# Patient Record
Sex: Female | Born: 1955
Health system: Southern US, Community
[De-identification: ages and names within clinical notes are randomized; demographics above are authoritative.]

## PROBLEM LIST (undated history)

## (undated) DIAGNOSIS — K219 Gastro-esophageal reflux disease without esophagitis: Secondary | ICD-10-CM

## (undated) DIAGNOSIS — E1110 Type 2 diabetes mellitus with ketoacidosis without coma: Secondary | ICD-10-CM

## (undated) DIAGNOSIS — K5792 Diverticulitis of intestine, part unspecified, without perforation or abscess without bleeding: Secondary | ICD-10-CM

## (undated) DIAGNOSIS — B019 Varicella without complication: Secondary | ICD-10-CM

## (undated) DIAGNOSIS — E785 Hyperlipidemia, unspecified: Secondary | ICD-10-CM

## (undated) DIAGNOSIS — R0789 Other chest pain: Secondary | ICD-10-CM

## (undated) DIAGNOSIS — T783XXA Angioneurotic edema, initial encounter: Secondary | ICD-10-CM

## (undated) DIAGNOSIS — H409 Unspecified glaucoma: Secondary | ICD-10-CM

## (undated) DIAGNOSIS — I519 Heart disease, unspecified: Secondary | ICD-10-CM

## (undated) DIAGNOSIS — I10 Essential (primary) hypertension: Secondary | ICD-10-CM

## (undated) DIAGNOSIS — I493 Ventricular premature depolarization: Secondary | ICD-10-CM

## (undated) DIAGNOSIS — Z6740 Type O blood, Rh positive: Secondary | ICD-10-CM

## (undated) HISTORY — DX: Other chest pain: R07.89

## (undated) HISTORY — DX: Hyperlipidemia, unspecified: E78.5

## (undated) HISTORY — DX: Ventricular premature depolarization: I49.3

## (undated) HISTORY — DX: Essential (primary) hypertension: I10

## (undated) HISTORY — DX: Type O blood, Rh positive: Z67.40

## (undated) HISTORY — DX: Heart disease, unspecified: I51.9

## (undated) HISTORY — DX: Angioneurotic edema, initial encounter: T78.3XXA

## (undated) HISTORY — DX: Type 2 diabetes mellitus with ketoacidosis without coma: E11.10

## (undated) HISTORY — PX: TOTAL ABDOMINAL HYSTERECTOMY: SHX209

## (undated) HISTORY — DX: Unspecified glaucoma: H40.9

## (undated) HISTORY — DX: Varicella without complication: B01.9

## (undated) HISTORY — DX: Diverticulitis of intestine, part unspecified, without perforation or abscess without bleeding: K57.92

## (undated) HISTORY — DX: Gastro-esophageal reflux disease without esophagitis: K21.9

---

## 1999-03-24 ENCOUNTER — Encounter: Admission: RE | Admit: 1999-03-24 | Discharge: 1999-03-24 | Payer: Self-pay | Admitting: Gynecology

## 1999-03-24 ENCOUNTER — Other Ambulatory Visit: Admission: RE | Admit: 1999-03-24 | Discharge: 1999-03-24 | Payer: Self-pay | Admitting: Gynecology

## 1999-03-24 ENCOUNTER — Encounter: Payer: Self-pay | Admitting: Gynecology

## 2000-03-28 ENCOUNTER — Other Ambulatory Visit: Admission: RE | Admit: 2000-03-28 | Discharge: 2000-03-28 | Payer: Self-pay | Admitting: Gynecology

## 2001-03-21 ENCOUNTER — Other Ambulatory Visit: Admission: RE | Admit: 2001-03-21 | Discharge: 2001-03-21 | Payer: Self-pay | Admitting: Gynecology

## 2001-03-26 ENCOUNTER — Encounter: Payer: Self-pay | Admitting: Gynecology

## 2001-03-26 ENCOUNTER — Encounter: Admission: RE | Admit: 2001-03-26 | Discharge: 2001-03-26 | Payer: Self-pay | Admitting: Dermatology

## 2002-04-11 HISTORY — PX: TOTAL ABDOMINAL HYSTERECTOMY: SHX209

## 2002-05-14 ENCOUNTER — Other Ambulatory Visit: Admission: RE | Admit: 2002-05-14 | Discharge: 2002-05-14 | Payer: Self-pay | Admitting: Gynecology

## 2003-02-21 ENCOUNTER — Encounter: Admission: RE | Admit: 2003-02-21 | Discharge: 2003-02-21 | Payer: Self-pay | Admitting: Gynecology

## 2003-02-26 ENCOUNTER — Other Ambulatory Visit: Admission: RE | Admit: 2003-02-26 | Discharge: 2003-02-26 | Payer: Self-pay | Admitting: Gynecology

## 2003-05-28 ENCOUNTER — Encounter (INDEPENDENT_AMBULATORY_CARE_PROVIDER_SITE_OTHER): Payer: Self-pay | Admitting: *Deleted

## 2003-05-28 ENCOUNTER — Observation Stay (HOSPITAL_COMMUNITY): Admission: RE | Admit: 2003-05-28 | Discharge: 2003-05-29 | Payer: Self-pay | Admitting: Gynecology

## 2004-02-02 ENCOUNTER — Encounter: Admission: RE | Admit: 2004-02-02 | Discharge: 2004-02-02 | Payer: Self-pay | Admitting: Family Medicine

## 2004-03-17 ENCOUNTER — Encounter: Admission: RE | Admit: 2004-03-17 | Discharge: 2004-03-17 | Payer: Self-pay | Admitting: Gynecology

## 2004-03-25 ENCOUNTER — Encounter: Admission: RE | Admit: 2004-03-25 | Discharge: 2004-03-25 | Payer: Self-pay | Admitting: Gynecology

## 2004-06-17 ENCOUNTER — Other Ambulatory Visit: Admission: RE | Admit: 2004-06-17 | Discharge: 2004-06-17 | Payer: Self-pay | Admitting: Gynecology

## 2005-04-18 ENCOUNTER — Encounter: Admission: RE | Admit: 2005-04-18 | Discharge: 2005-04-18 | Payer: Self-pay | Admitting: Gynecology

## 2005-06-23 ENCOUNTER — Other Ambulatory Visit: Admission: RE | Admit: 2005-06-23 | Discharge: 2005-06-23 | Payer: Self-pay | Admitting: Gynecology

## 2006-05-18 ENCOUNTER — Encounter: Admission: RE | Admit: 2006-05-18 | Discharge: 2006-05-18 | Payer: Self-pay | Admitting: Gynecology

## 2006-07-20 ENCOUNTER — Other Ambulatory Visit: Admission: RE | Admit: 2006-07-20 | Discharge: 2006-07-20 | Payer: Self-pay | Admitting: Gynecology

## 2007-05-21 ENCOUNTER — Encounter: Admission: RE | Admit: 2007-05-21 | Discharge: 2007-05-21 | Payer: Self-pay | Admitting: Gynecology

## 2007-09-18 ENCOUNTER — Encounter: Admission: RE | Admit: 2007-09-18 | Discharge: 2007-09-18 | Payer: Self-pay | Admitting: Cardiology

## 2008-04-11 HISTORY — PX: CARDIAC CATHETERIZATION: SHX172

## 2008-04-11 HISTORY — PX: OTHER SURGICAL HISTORY: SHX169

## 2008-06-06 ENCOUNTER — Encounter: Admission: RE | Admit: 2008-06-06 | Discharge: 2008-06-06 | Payer: Self-pay | Admitting: Gynecology

## 2009-06-08 ENCOUNTER — Encounter: Admission: RE | Admit: 2009-06-08 | Discharge: 2009-06-08 | Payer: Self-pay | Admitting: Gynecology

## 2010-05-01 ENCOUNTER — Encounter: Payer: Self-pay | Admitting: Gynecology

## 2010-06-28 ENCOUNTER — Other Ambulatory Visit: Payer: Self-pay | Admitting: Gynecology

## 2010-06-28 DIAGNOSIS — Z1231 Encounter for screening mammogram for malignant neoplasm of breast: Secondary | ICD-10-CM

## 2010-07-15 ENCOUNTER — Ambulatory Visit
Admission: RE | Admit: 2010-07-15 | Discharge: 2010-07-15 | Disposition: A | Discharge status: Home / Self Care | Payer: 59 | Source: Ambulatory Visit | Attending: Gynecology | Admitting: Gynecology

## 2010-07-15 DIAGNOSIS — Z1231 Encounter for screening mammogram for malignant neoplasm of breast: Secondary | ICD-10-CM

## 2010-08-02 ENCOUNTER — Other Ambulatory Visit: Payer: Self-pay | Admitting: Gynecology

## 2010-08-27 NOTE — H&P (Signed)
NAME:  Carol [name], Carol [name]                           ACCOUNT NO.:  [id number]   MEDICAL RECORD NO.:  [id number]                   PATIENT TYPE:  AMB   LOCATION:  DAY                                  FACILITY:  [facility]   PHYSICIAN:  Gretta Cool, M.D.              DATE OF BIRTH:  [birth year]   DATE OF ADMISSION:  DATE OF DISCHARGE:                                HISTORY & PHYSICAL   CHIEF COMPLAINT:  Abnormal uterine bleeding.   HISTORY OF PRESENT ILLNESS:  Carol [name] is a 55 year old nulligravida with a  history of subseptate uterus determined by diagnostic laparoscopy in 1986.  She has had no significant GYN concerns until the last year when she has  developed abnormal uterine bleeding.  On ultrasound examination, she has  multiple uterine leiomyomata, one of which involves the endometrial cavity.  She has had progressively severe abnormal uterine bleeding with prolongation  of flow.  On ultrasound examination, she has multiple fibroids with luminal  intrusion into the cavity, which would be difficult to manage by  hysteroscopy.  She is now admitted for definitive therapy by total abdominal  hysterectomy versus supracervical hysterectomy, possible bilateral salpingo-  oophorectomy.  I have discussed all options, including embolization therapy.  She wishes to proceed to definitive therapy, as planned.  The risks and  benefits ratio have been explained in complete detail.  All options have  been explained in complete detail.   PAST MEDICAL HISTORY:  Usual childhood diseases without sequelae.  Denies  significant previous medical illness.  No hospitalizations other than for a  diagnostic laparoscopy   MEDICATIONS:  None.   ALLERGIES:  1. SULFA.  2. MACRODANTIN.   FAMILY HISTORY:  Father died of cancer of the lung at an early age.  Mother  died of cardiovascular disease and emphysema.  A strong family history of  cardiovascular disease in two brothers and a sister with hypertension.   No  other known familial tendency.   REVIEW OF SYSTEMS:  HEENT:  Denies asthma, cough, bronchitis, shortness of  breath.  GI/GU:  Denies frequency, urgency, dysuria, change in bowel habits,  or food intolerance.   PHYSICAL EXAMINATION:  GENERAL:  A well-developed and well-nourished thin  white female.  HEENT:  Pupils are equal, round and reactive to light and accommodation.  Fundi not examined.  Oropharynx clear.  NECK:  Supple without mass or thyroid enlargement.  LUNGS:  Clear to P&A.  HEART:  Regular rhythm without murmur or cardiac enlargement.  BREASTS:  Without mass, nodes, or nipple discharge.  ABDOMEN:  Soft, scaphoid, without mass or organomegaly.  Her uterus is  nonpalpable abdominally.  PELVIC:  External genitalia:  Normal female.  Vagina clean, rugous.  Cervix  is nulliparous.  Clean.  The uterus is firm and enlarged with multiple  leiomyomata.  Adnexa clear.  Rectovaginal exam confirms.  EXTREMITIES:  Negative.  NEUROLOGIC:  Physiologic.  IMPRESSION:  1. Uterine leiomyomata with multiple fibroids involving the cavity and     abnormal uterine bleeding.  2. Dyslipidemia.  3. Hypertriglyceridemia.  4. Strong family history of early onset of cardiovascular disease and     metabolic syndrome by NMR LipoProfile.                                               Gretta Cool, M.D.    CWL/MEDQ  D:  05/27/2003  T:  05/27/2003  Job:  [id number]   cc:   [facility]

## 2010-08-27 NOTE — Op Note (Signed)
NAME:  Carol [name], Carol [name]                           ACCOUNT NO.:  [id number]   MEDICAL RECORD NO.:  [id number]                   PATIENT TYPE:  AMB   LOCATION:  DAY                                  FACILITY:  [facility]   PHYSICIAN:  Gretta Cool, M.D.              DATE OF BIRTH:  [birth year]   DATE OF PROCEDURE:  05/28/2003  DATE OF DISCHARGE:                                 OPERATIVE REPORT   PREOPERATIVE DIAGNOSES:  1. Uterine leiomyomata with abnormal uterine bleeding.  2. Subseptate uterus.  3. Perimenopausal.   POSTOPERATIVE DIAGNOSES:  1. Uterine leiomyomata with abnormal uterine bleeding.  2. Subseptate uterus.  3. Perimenopausal.  4. Left ovarian adhesion suspicious of endometriosis subseptate uterus.   PROCEDURE:  Supracervical hysterectomy, bilateral salpingo-oophorectomy.   SURGEON:  Gretta Cool, M.D.   ASSISTANT:  Raynald Kemp, M.D.   ANESTHESIA:  General orotracheal.   DESCRIPTION OF PROCEDURE:  Under excellent general anesthesia with the  patient prepped and draped in lithotomy position, Foley catheter draining  her bladder, a Pfannenstiel incision was made and extended through the  fascia.  The rectus muscles were separated in the midline and the peritoneum  opened.  The abdominal cavity was examined and the abnormalities identified.  No abnormalities in the upper abdomen identified.  The examination of the  uterus revealed multiple leiomyomata, a cystic fixed adherent left ovary  with dense adhesions on the inferior aspect to the lateral pelvic wall.  There was characteristic deformity of subseptate uterus present.  Multiple  fibroids bulging into the cavity.  At this point because of a withered right  ovary and a very densely adherent left ovary, decision was made to proceed  with bilateral salpingo-oophorectomy as well.  At this point, the uterus was  elevated with Kelly clamps applied across each adnexal structures, uterus  was elevated and the round  ligaments transected by cautery.  The bladder  flap was then opened and the bladder pushed off the lower segment.  Infundibulopelvic vessels were then isolated from the ureter, clamped, cut,  sutured, and tied with 0 Vicryl.  The pedicle was then doubly ligated with 0  Vicryl.  At this point, the uterine vessels were skeletonized, clamped, cut,  sutured, and tied with 0 Vicryl.  Next, the cervix was progressively excised  and the uterus elevated so as to excise the greatest majority of the  endocervical canal.  At the end of the excision, a conical excision of the  endocervical canal had been made.  The remainder of the canal was treated by  cautery.  At this point, the cervix was closed with deep suture of 0 Vicryl  and superficial closure of 0 Vicryl as well.  At the end of the procedure,  the pelvic floor was reperitonealized with running suture of 2-0 Monocryl.  The vessels irrigated to remove debris and the packs removed.  The  retractors  were removed.  The abdominal peritoneum was then closed with  running suture of 0 Monocryl.  That same suture was used to close the rectus  muscles plicating the rectus muscles in the midline.  The fascia was  approximated with running suture of 0 Vicryl from each angle of the midline.  Subcutaneous tissue was approximated with interrupted  sutures of 3-0 Vicryl and the skin was closed with skin staples and Steri-  Strips.  At the end of the procedure, the sponge and lap counts are correct.  There were no complications.  The patient returned to the recovery room in  excellent condition without complication.                                               Gretta Cool, M.D.    CWL/MEDQ  D:  05/28/2003  T:  05/28/2003  Job:  [id number]   cc:   Deboraha Sprang at [facility]

## 2011-07-19 ENCOUNTER — Other Ambulatory Visit: Payer: Self-pay | Admitting: Gynecology

## 2011-07-19 DIAGNOSIS — Z1231 Encounter for screening mammogram for malignant neoplasm of breast: Secondary | ICD-10-CM

## 2011-08-03 ENCOUNTER — Ambulatory Visit
Admission: RE | Admit: 2011-08-03 | Discharge: 2011-08-03 | Disposition: A | Discharge status: Home / Self Care | Payer: 59 | Source: Ambulatory Visit | Attending: Gynecology | Admitting: Gynecology

## 2011-08-03 ENCOUNTER — Other Ambulatory Visit: Payer: Self-pay | Admitting: Gynecology

## 2011-08-03 DIAGNOSIS — Z1231 Encounter for screening mammogram for malignant neoplasm of breast: Secondary | ICD-10-CM

## 2012-09-17 ENCOUNTER — Other Ambulatory Visit: Payer: Self-pay

## 2012-09-17 DIAGNOSIS — Z1231 Encounter for screening mammogram for malignant neoplasm of breast: Secondary | ICD-10-CM

## 2012-10-01 ENCOUNTER — Ambulatory Visit: Payer: 59

## 2012-10-05 ENCOUNTER — Ambulatory Visit
Admission: RE | Admit: 2012-10-05 | Discharge: 2012-10-05 | Disposition: A | Discharge status: Home / Self Care | Payer: 59 | Source: Ambulatory Visit

## 2012-10-05 DIAGNOSIS — Z1231 Encounter for screening mammogram for malignant neoplasm of breast: Secondary | ICD-10-CM

## 2013-01-05 ENCOUNTER — Other Ambulatory Visit: Payer: Self-pay | Admitting: Cardiology

## 2013-01-05 DIAGNOSIS — Z79899 Other long term (current) drug therapy: Secondary | ICD-10-CM

## 2013-01-05 DIAGNOSIS — E7800 Pure hypercholesterolemia, unspecified: Secondary | ICD-10-CM

## 2013-01-25 ENCOUNTER — Other Ambulatory Visit: Payer: Self-pay | Admitting: Cardiology

## 2013-01-25 MED ORDER — METOPROLOL SUCCINATE ER 50 MG PO TB24: 50.0000 mg | ORAL_TABLET | Freq: Every day | ORAL | Status: DC

## 2013-02-13 ENCOUNTER — Encounter: Payer: Self-pay | Admitting: Cardiology

## 2013-02-13 DIAGNOSIS — K219 Gastro-esophageal reflux disease without esophagitis: Secondary | ICD-10-CM | POA: Insufficient documentation

## 2013-02-13 DIAGNOSIS — I519 Heart disease, unspecified: Secondary | ICD-10-CM | POA: Insufficient documentation

## 2013-02-13 DIAGNOSIS — R0789 Other chest pain: Secondary | ICD-10-CM | POA: Insufficient documentation

## 2013-02-13 DIAGNOSIS — I10 Essential (primary) hypertension: Secondary | ICD-10-CM | POA: Insufficient documentation

## 2013-02-13 DIAGNOSIS — E785 Hyperlipidemia, unspecified: Secondary | ICD-10-CM | POA: Insufficient documentation

## 2013-02-14 ENCOUNTER — Encounter: Payer: Self-pay | Admitting: Cardiology

## 2013-02-14 ENCOUNTER — Other Ambulatory Visit: Payer: 59

## 2013-02-14 ENCOUNTER — Ambulatory Visit (INDEPENDENT_AMBULATORY_CARE_PROVIDER_SITE_OTHER): Payer: 59 | Admitting: Cardiology

## 2013-02-14 VITALS — BP 141/81 | HR 61 | Ht 63.0 in | Wt 187.0 lb

## 2013-02-14 DIAGNOSIS — R0789 Other chest pain: Secondary | ICD-10-CM

## 2013-02-14 DIAGNOSIS — E785 Hyperlipidemia, unspecified: Secondary | ICD-10-CM

## 2013-02-14 DIAGNOSIS — I10 Essential (primary) hypertension: Secondary | ICD-10-CM

## 2013-02-14 MED ORDER — AMLODIPINE BESYLATE 5 MG PO TABS: 2.5000 mg | ORAL_TABLET | Freq: Every day | ORAL | Status: DC

## 2013-02-14 NOTE — Patient Instructions (Signed)
Your physician recommends that you continue on your current medications as directed. Please refer to the Current Medication list given to you today.  Your physician wants you to follow-up in: 1 year with Dr. Skains. You will receive a reminder letter in the mail two months in advance. If you don't receive a letter, please call our office to schedule the follow-up appointment.  

## 2013-02-14 NOTE — Progress Notes (Signed)
[address], [address] [place], [place]  [address] Phone: [phone] Fax:  [phone]  Date:  02/14/2013   ID:  Carol [name], DOB 1955-06-14, MRN [id number]  PCP:  Lenora Boys, MD   History of Present Illness: Carol [name] is a 57 y.o. female with hypertension, strong family history of coronary artery disease, hyperlipidemia here for followup.  Hypertension-amlodipine was started, at 5 mg she noted increasing ankle edema, this was reduced to 2.5 mg. Since doing this adjustment, she has done much better. She occasionally will wear her TED hose as well. This helps. She notices the mild edema when standing on her feet, walking or sitting on a bus.Her blood pressure, her numbers at home have been better. Systolics have been in the 130 to 140 range.  She also denies any return of atypical chest discomfort under her left breast.  Her hyperlipidemia management has been adjusted by [employer], Pharm.D. with recent increase in Niaspan.    Wt Readings from Last 3 Encounters:  02/14/13 187 lb (84.823 kg)     Past Medical History  Diagnosis Date  . Chest discomfort     , Atypical  . Systolic dysfunction     , Hyperdynamic  . Gastroesophageal reflux disease   . Hyperlipidemia   . Hypertension     Past Surgical History  Procedure Laterality Date  . Total abdominal hysterectomy      Current Outpatient Prescriptions  Medication Sig Dispense Refill  . amLODipine (NORVASC) 5 MG tablet Take 2.5 mg by mouth daily.      Marland Kitchen aspirin 81 MG tablet Take 81 mg by mouth daily.      . cetirizine (ZYRTEC) 10 MG tablet Take 10 mg by mouth daily.       Marland Kitchen latanoprost (XALATAN) 0.005 % ophthalmic solution As directed      . metoprolol succinate (TOPROL-XL) 50 MG 24 hr tablet Take 1 tablet (50 mg total) by mouth daily. Take with or immediately following a meal.  30 tablet  6  . MINIVELLE 0.05 MG/24HR patch As directed      . niacin (NIASPAN) 1000 MG CR tablet Take 2,000 mg by  mouth at bedtime.      Marland Kitchen omeprazole (PRILOSEC) 20 MG capsule Take 20 mg by mouth daily.      . rosuvastatin (CRESTOR) 20 MG tablet Take 20 mg by mouth daily.       No current facility-administered medications for this visit.    Allergies:    Allergies  Allergen Reactions  . Ivp Dye [Iodinated Diagnostic Agents] Hives  . Lisinopril Cough  . Sulfa Antibiotics Hives    Social History:  The patient  reports that she has never smoked. She does not have any smokeless tobacco history on file. She reports that she drinks alcohol. She reports that she does not use illicit drugs.   ROS:  Please see the history of present illness.   Denies any syncope, bleeding, orthopnea, PND   PHYSICAL EXAM: VS:  BP 141/81  Pulse 61  Ht 5\' 3"  (1.6 m)  Wt 187 lb (84.823 kg)  BMI 33.13 kg/m2 Well nourished, well developed, in no acute distress HEENT: normal Neck: no JVD Cardiac:  normal S1, S2; RRR; no murmur Lungs:  clear to auscultation bilaterally, no wheezing, rhonchi or rales Abd: soft, nontender, no hepatomegaly Ext: no edema.  Skin: warm and dry Neuro: no focal abnormalities noted  EKG:  None today LDL-P -  1153 on 4/14   ASSESSMENT AND PLAN:  1. Chest pain-no further episodes. Doing well. 2. Hypertension-currently reasonable control. Upper normal. No significant edema on amlodipine. 3. Obesity-encourage weight loss. BMI currently 33.discussed weight loss. Her husband is a vegan. 4. Hyperlipidemia-Jeremy Smart, Pharm.D. Has assisted in the past. Currently on both Crestor as well as niacin.  Signed, Donato Schultz, MD [facility]  02/14/2013 10:46 AM

## 2013-02-15 ENCOUNTER — Ambulatory Visit (INDEPENDENT_AMBULATORY_CARE_PROVIDER_SITE_OTHER): Payer: 59 | Admitting: *Deleted

## 2013-02-15 DIAGNOSIS — Z79899 Other long term (current) drug therapy: Secondary | ICD-10-CM

## 2013-02-15 DIAGNOSIS — E7800 Pure hypercholesterolemia, unspecified: Secondary | ICD-10-CM

## 2013-02-15 LAB — ALT: ALT: 26 U/L (ref 0–35)

## 2013-02-15 NOTE — Progress Notes (Signed)
Left message for patient to call the office

## 2013-02-18 ENCOUNTER — Telehealth: Payer: Self-pay | Admitting: Cardiology

## 2013-02-18 LAB — NMR LIPOPROFILE WITH LIPIDS
HDL Particle Number: 34.6 umol/L (ref >=30.5)
HDL-C: 44 mg/dL (ref >=40)
LDL Particle Number: 1254 nmol/L — ABNORMAL HIGH (ref <1000)
LDL Size: 20.3 nm — ABNORMAL LOW (ref >20.5)
LP-IR Score: 55 — ABNORMAL HIGH (ref <=45)
Large HDL-P: 3 umol/L — ABNORMAL LOW (ref >=4.8)

## 2013-02-18 NOTE — Telephone Encounter (Signed)
I spoke with the patient and gave her her lab results. I wasn't certain if there was anything else that Kenyatta was trying to notify her of. I will forward to Kenyatta. He only needs to call back if there was something else he needed to notify her of.

## 2013-02-18 NOTE — Telephone Encounter (Signed)
Follow Up ° °Pt returning the call  °

## 2013-02-22 ENCOUNTER — Other Ambulatory Visit: Payer: Self-pay | Admitting: Cardiology

## 2013-02-22 ENCOUNTER — Telehealth: Payer: Self-pay | Admitting: Cardiology

## 2013-02-22 DIAGNOSIS — E7800 Pure hypercholesterolemia, unspecified: Secondary | ICD-10-CM

## 2013-02-22 MED ORDER — ROSUVASTATIN CALCIUM 40 MG PO TABS: 40.0000 mg | ORAL_TABLET | Freq: Every day | ORAL | Status: DC

## 2013-02-22 NOTE — Telephone Encounter (Signed)
Lab results and Medication change.

## 2013-03-21 ENCOUNTER — Other Ambulatory Visit: Payer: Self-pay

## 2013-03-21 MED ORDER — NIACIN ER (ANTIHYPERLIPIDEMIC) 1000 MG PO TBCR: 2000.0000 mg | EXTENDED_RELEASE_TABLET | Freq: Every day | ORAL | Status: DC

## 2013-04-11 DIAGNOSIS — M722 Plantar fascial fibromatosis: Secondary | ICD-10-CM

## 2013-04-11 HISTORY — DX: Plantar fascial fibromatosis: M72.2

## 2013-05-28 ENCOUNTER — Other Ambulatory Visit: Payer: 59

## 2013-06-12 ENCOUNTER — Other Ambulatory Visit (INDEPENDENT_AMBULATORY_CARE_PROVIDER_SITE_OTHER): Payer: 59

## 2013-06-12 DIAGNOSIS — E7800 Pure hypercholesterolemia, unspecified: Secondary | ICD-10-CM

## 2013-06-12 LAB — HEPATIC FUNCTION PANEL
ALBUMIN: 4 g/dL (ref 3.5–5.2)
ALT: 24 U/L (ref 0–35)
AST: 21 U/L (ref 0–37)
Alkaline Phosphatase: 80 U/L (ref 39–117)
BILIRUBIN DIRECT: 0 mg/dL (ref 0.0–0.3)
TOTAL PROTEIN: 7.8 g/dL (ref 6.0–8.3)
Total Bilirubin: 0.6 mg/dL (ref 0.3–1.2)

## 2013-06-12 NOTE — Progress Notes (Signed)
Quick Note:  Preliminary report reviewed by triage nurse and sent to MD desk. ______ 

## 2013-06-14 LAB — NMR LIPOPROFILE WITH LIPIDS
Cholesterol, Total: 154 mg/dL (ref <200)
HDL PARTICLE NUMBER: 34.4 umol/L (ref >=30.5)
HDL Size: 9.1 nm — ABNORMAL LOW (ref >=9.2)
HDL-C: 54 mg/dL (ref >=40)
LARGE VLDL-P: 3.7 nmol/L — AB (ref <=2.7)
LDL (calc): 79 mg/dL (ref <100)
LDL Particle Number: 957 nmol/L (ref <1000)
LDL Size: 20.4 nm — ABNORMAL LOW (ref >20.5)
LP-IR SCORE: 53 — AB (ref <=45)
Large HDL-P: 5.2 umol/L (ref >=4.8)
Small LDL Particle Number: 557 nmol/L — ABNORMAL HIGH (ref <=527)
Triglycerides: 103 mg/dL (ref <150)
VLDL Size: 46.6 nm (ref <=46.6)

## 2013-06-18 ENCOUNTER — Other Ambulatory Visit: Payer: Self-pay | Admitting: Cardiology

## 2013-06-18 DIAGNOSIS — I519 Heart disease, unspecified: Secondary | ICD-10-CM

## 2013-06-18 DIAGNOSIS — E785 Hyperlipidemia, unspecified: Secondary | ICD-10-CM

## 2013-08-16 ENCOUNTER — Other Ambulatory Visit: Payer: Self-pay

## 2013-08-16 MED ORDER — ROSUVASTATIN CALCIUM 40 MG PO TABS: 40.0000 mg | ORAL_TABLET | Freq: Every day | ORAL | Status: DC

## 2013-08-16 MED ORDER — METOPROLOL SUCCINATE ER 50 MG PO TB24: 50.0000 mg | ORAL_TABLET | Freq: Every day | ORAL | Status: DC

## 2013-10-31 ENCOUNTER — Encounter (HOSPITAL_COMMUNITY): Payer: Self-pay | Admitting: Emergency Medicine

## 2013-10-31 ENCOUNTER — Inpatient Hospital Stay (HOSPITAL_COMMUNITY)
Admission: EM | Admit: 2013-10-31 | Discharge: 2013-11-13 | DRG: 329 | Disposition: A | Discharge status: Home / Self Care | Payer: 59 | Attending: General Surgery | Admitting: General Surgery

## 2013-10-31 ENCOUNTER — Emergency Department (HOSPITAL_COMMUNITY): Payer: 59

## 2013-10-31 DIAGNOSIS — E43 Unspecified severe protein-calorie malnutrition: Secondary | ICD-10-CM | POA: Diagnosis present

## 2013-10-31 DIAGNOSIS — K929 Disease of digestive system, unspecified: Secondary | ICD-10-CM | POA: Diagnosis not present

## 2013-10-31 DIAGNOSIS — I10 Essential (primary) hypertension: Secondary | ICD-10-CM | POA: Diagnosis present

## 2013-10-31 DIAGNOSIS — K651 Peritoneal abscess: Secondary | ICD-10-CM | POA: Diagnosis not present

## 2013-10-31 DIAGNOSIS — K631 Perforation of intestine (nontraumatic): Secondary | ICD-10-CM

## 2013-10-31 DIAGNOSIS — K5732 Diverticulitis of large intestine without perforation or abscess without bleeding: Secondary | ICD-10-CM

## 2013-10-31 DIAGNOSIS — K560 Paralytic ileus: Secondary | ICD-10-CM | POA: Diagnosis not present

## 2013-10-31 DIAGNOSIS — Y832 Surgical operation with anastomosis, bypass or graft as the cause of abnormal reaction of the patient, or of later complication, without mention of misadventure at the time of the procedure: Secondary | ICD-10-CM | POA: Diagnosis not present

## 2013-10-31 DIAGNOSIS — K219 Gastro-esophageal reflux disease without esophagitis: Secondary | ICD-10-CM | POA: Diagnosis present

## 2013-10-31 DIAGNOSIS — Z6832 Body mass index (BMI) 32.0-32.9, adult: Secondary | ICD-10-CM | POA: Diagnosis not present

## 2013-10-31 DIAGNOSIS — D62 Acute posthemorrhagic anemia: Secondary | ICD-10-CM | POA: Diagnosis not present

## 2013-10-31 DIAGNOSIS — K5712 Diverticulitis of small intestine without perforation or abscess without bleeding: Secondary | ICD-10-CM | POA: Diagnosis not present

## 2013-10-31 DIAGNOSIS — Z888 Allergy status to other drugs, medicaments and biological substances status: Secondary | ICD-10-CM | POA: Diagnosis not present

## 2013-10-31 DIAGNOSIS — E785 Hyperlipidemia, unspecified: Secondary | ICD-10-CM | POA: Diagnosis present

## 2013-10-31 DIAGNOSIS — R112 Nausea with vomiting, unspecified: Secondary | ICD-10-CM

## 2013-10-31 DIAGNOSIS — Z7982 Long term (current) use of aspirin: Secondary | ICD-10-CM

## 2013-10-31 DIAGNOSIS — Z91041 Radiographic dye allergy status: Secondary | ICD-10-CM

## 2013-10-31 DIAGNOSIS — Z882 Allergy status to sulfonamides status: Secondary | ICD-10-CM

## 2013-10-31 DIAGNOSIS — E876 Hypokalemia: Secondary | ICD-10-CM | POA: Diagnosis not present

## 2013-10-31 DIAGNOSIS — R1031 Right lower quadrant pain: Secondary | ICD-10-CM

## 2013-10-31 LAB — URINALYSIS, ROUTINE W REFLEX MICROSCOPIC
Bilirubin Urine: NEGATIVE
GLUCOSE, UA: NEGATIVE mg/dL
HGB URINE DIPSTICK: NEGATIVE
Ketones, ur: NEGATIVE mg/dL
LEUKOCYTES UA: NEGATIVE
Nitrite: NEGATIVE
PH: 6 (ref 5.0–8.0)
PROTEIN: NEGATIVE mg/dL
Specific Gravity, Urine: 1.021 (ref 1.005–1.030)
Urobilinogen, UA: 1 mg/dL (ref 0.0–1.0)

## 2013-10-31 LAB — COMPREHENSIVE METABOLIC PANEL
ALBUMIN: 4 g/dL (ref 3.5–5.2)
ALT: 22 U/L (ref 0–35)
AST: 20 U/L (ref 0–37)
Alkaline Phosphatase: 73 U/L (ref 39–117)
Anion gap: 17 — ABNORMAL HIGH (ref 5–15)
BUN: 24 mg/dL — AB (ref 6–23)
CO2: 25 mEq/L (ref 19–32)
Calcium: 9.8 mg/dL (ref 8.4–10.5)
Chloride: 96 mEq/L (ref 96–112)
Creatinine, Ser: 0.71 mg/dL (ref 0.50–1.10)
GFR calc Af Amer: >90 mL/min (ref >90)
Glucose, Bld: 122 mg/dL — ABNORMAL HIGH (ref 70–99)
Potassium: 3.7 mEq/L (ref 3.7–5.3)
Sodium: 138 mEq/L (ref 137–147)
Total Bilirubin: 0.4 mg/dL (ref 0.3–1.2)
Total Protein: 7.8 g/dL (ref 6.0–8.3)

## 2013-10-31 LAB — CBC WITH DIFFERENTIAL/PLATELET
BASOS ABS: 0 10*3/uL (ref 0.0–0.1)
Basophils Relative: 0 % (ref 0–1)
EOS ABS: 0 10*3/uL (ref 0.0–0.7)
Eosinophils Relative: 0 % (ref 0–5)
HEMATOCRIT: 42.5 % (ref 36.0–46.0)
Hemoglobin: 14.3 g/dL (ref 12.0–15.0)
Lymphocytes Relative: 12 % (ref 12–46)
Lymphs Abs: 3.1 10*3/uL (ref 0.7–4.0)
MCH: 28.9 pg (ref 26.0–34.0)
MCHC: 33.6 g/dL (ref 30.0–36.0)
MCV: 85.9 fL (ref 78.0–100.0)
MONO ABS: 1 10*3/uL (ref 0.1–1.0)
Monocytes Relative: 4 % (ref 3–12)
Neutro Abs: 22 10*3/uL — ABNORMAL HIGH (ref 1.7–7.7)
Neutrophils Relative %: 84 % — ABNORMAL HIGH (ref 43–77)
Platelets: 413 10*3/uL — ABNORMAL HIGH (ref 150–400)
RBC: 4.95 MIL/uL (ref 3.87–5.11)
RDW: 13.6 % (ref 11.5–15.5)
WBC: 26.1 10*3/uL — ABNORMAL HIGH (ref 4.0–10.5)

## 2013-10-31 MED ORDER — SODIUM CHLORIDE 0.9 % IV BOLUS (SEPSIS)
1000.0000 mL | Freq: Once | INTRAVENOUS | Status: AC
  Administered 2013-10-31: 1000 mL via INTRAVENOUS

## 2013-10-31 MED ORDER — LATANOPROST 0.005 % OP SOLN
1.0000 [drp] | Freq: Every day | OPHTHALMIC | Status: DC
  Administered 2013-11-01 – 2013-11-12 (×13): 1 [drp] via OPHTHALMIC
  Filled 2013-10-31: qty 2.5

## 2013-10-31 MED ORDER — SODIUM CHLORIDE 0.9 % IV SOLN
INTRAVENOUS | Status: DC
Start: 2013-11-01 — End: 2013-11-02
  Administered 2013-11-01 – 2013-11-02 (×4): via INTRAVENOUS

## 2013-10-31 MED ORDER — ONDANSETRON HCL 4 MG/2ML IJ SOLN
4.0000 mg | Freq: Four times a day (QID) | INTRAMUSCULAR | Status: DC | PRN
  Administered 2013-11-01 – 2013-11-04 (×6): 4 mg via INTRAVENOUS
  Filled 2013-10-31 (×6): qty 2

## 2013-10-31 MED ORDER — HYDROMORPHONE HCL PF 1 MG/ML IJ SOLN
1.0000 mg | Freq: Once | INTRAMUSCULAR | Status: AC
  Administered 2013-10-31: 1 mg via INTRAVENOUS
  Filled 2013-10-31: qty 1

## 2013-10-31 MED ORDER — ACETAMINOPHEN 650 MG RE SUPP: 650.0000 mg | Freq: Four times a day (QID) | RECTAL | Status: DC | PRN

## 2013-10-31 MED ORDER — SODIUM CHLORIDE 0.9 % IV SOLN
1.0000 g | Freq: Every day | INTRAVENOUS | Status: DC
  Administered 2013-11-01 – 2013-11-11 (×12): 1 g via INTRAVENOUS
  Filled 2013-10-31 (×14): qty 1

## 2013-10-31 MED ORDER — ONDANSETRON HCL 4 MG/2ML IJ SOLN
4.0000 mg | Freq: Once | INTRAMUSCULAR | Status: AC
  Administered 2013-10-31: 4 mg via INTRAVENOUS
  Filled 2013-10-31: qty 2

## 2013-10-31 MED ORDER — METOPROLOL SUCCINATE ER 25 MG PO TB24
25.0000 mg | ORAL_TABLET | Freq: Every morning | ORAL | Status: DC
  Administered 2013-11-01 – 2013-11-05 (×5): 25 mg via ORAL
  Filled 2013-10-31 (×5): qty 1

## 2013-10-31 MED ORDER — FENTANYL CITRATE 0.05 MG/ML IJ SOLN
50.0000 ug | Freq: Once | INTRAMUSCULAR | Status: AC
  Administered 2013-10-31: 50 ug via NASAL
  Filled 2013-10-31: qty 2

## 2013-10-31 MED ORDER — PIPERACILLIN-TAZOBACTAM 3.375 G IVPB
3.3750 g | Freq: Once | INTRAVENOUS | Status: AC
  Administered 2013-10-31: 3.375 g via INTRAVENOUS
  Filled 2013-10-31: qty 50

## 2013-10-31 MED ORDER — PANTOPRAZOLE SODIUM 40 MG IV SOLR
40.0000 mg | Freq: Every day | INTRAVENOUS | Status: DC
  Administered 2013-11-01 – 2013-11-11 (×12): 40 mg via INTRAVENOUS
  Filled 2013-10-31 (×17): qty 40

## 2013-10-31 MED ORDER — HYDROMORPHONE HCL PF 1 MG/ML IJ SOLN
1.0000 mg | INTRAMUSCULAR | Status: DC | PRN
  Administered 2013-10-31 – 2013-11-05 (×31): 1 mg via INTRAVENOUS
  Filled 2013-10-31 (×31): qty 1

## 2013-10-31 MED ORDER — ACETAMINOPHEN 325 MG PO TABS
650.0000 mg | ORAL_TABLET | Freq: Four times a day (QID) | ORAL | Status: DC | PRN
Start: 2013-10-31 — End: 2013-11-05
  Administered 2013-11-01 – 2013-11-05 (×7): 650 mg via ORAL
  Filled 2013-10-31 (×7): qty 2

## 2013-10-31 MED ORDER — ENOXAPARIN SODIUM 40 MG/0.4ML ~~LOC~~ SOLN
40.0000 mg | Freq: Every day | SUBCUTANEOUS | Status: DC
  Administered 2013-11-01 – 2013-11-04 (×4): 40 mg via SUBCUTANEOUS
  Filled 2013-10-31 (×5): qty 0.4

## 2013-10-31 NOTE — ED Notes (Signed)
IV beeping "Infusion complete". Shut IV pump off.

## 2013-10-31 NOTE — ED Provider Notes (Signed)
CSN: [id number]     Arrival date & time 10/31/13  1654 History   First MD Initiated Contact with Patient 10/31/13 1726     Chief Complaint  Patient presents with  . Abdominal Pain     (Consider location/radiation/quality/duration/timing/severity/associated sxs/prior Treatment) HPI 58 year old female with approx 6 hours of RLQ abd pain. Started acutely and has been constant. Nausea and some vomiting. Has felt diaphoretic but no fevers. Sitting up makes pain and nausea worse. Never had pain like this before. No history of kidney stones. Pain at this time is severe.   Past Medical History  Diagnosis Date  . Chest discomfort     , Atypical  . Systolic dysfunction     , Hyperdynamic  . Gastroesophageal reflux disease   . Hyperlipidemia   . Hypertension    Past Surgical History  Procedure Laterality Date  . Total abdominal hysterectomy     No family history on file. History  Substance Use Topics  . Smoking status: Never Smoker   . Smokeless tobacco: Not on file  . Alcohol Use: Yes     Comment: Occassional drinker   OB History   Grav Para Term Preterm Abortions TAB SAB Ect Mult Living                 Review of Systems  Constitutional: Positive for diaphoresis. Negative for fever.  Gastrointestinal: Positive for nausea, vomiting and abdominal pain. Negative for diarrhea.  Genitourinary: Negative for dysuria and hematuria.  Musculoskeletal: Negative for back pain.  All other systems reviewed and are negative.     Allergies  Ivp dye; Lisinopril; and Sulfa antibiotics  Home Medications   Prior to Admission medications   Medication Sig Start Date End Date Taking? Authorizing Provider  amLODipine (NORVASC) 5 MG tablet Take 0.5 tablets (2.5 mg total) by mouth daily. 02/14/13   Donato Schultz, MD  aspirin 81 MG tablet Take 81 mg by mouth daily.    Historical Provider, MD  cetirizine (ZYRTEC) 10 MG tablet Take 10 mg by mouth daily.     Historical Provider, MD  latanoprost  (XALATAN) 0.005 % ophthalmic solution As directed 01/14/13   Historical Provider, MD  metoprolol succinate (TOPROL-XL) 50 MG 24 hr tablet Take 1 tablet (50 mg total) by mouth daily. Take with or immediately following a meal. 08/16/13   Donato Schultz, MD  MINIVELLE 0.05 MG/24HR patch As directed 02/05/13   Historical Provider, MD  niacin (NIASPAN) 1000 MG CR tablet Take 2 tablets (2,000 mg total) by mouth at bedtime. 03/21/13   Donato Schultz, MD  omeprazole (PRILOSEC) 20 MG capsule Take 20 mg by mouth daily.    Historical Provider, MD  rosuvastatin (CRESTOR) 40 MG tablet Take 1 tablet (40 mg total) by mouth daily. 08/16/13   Donato Schultz, MD   BP 135/71  Pulse 106  Temp(Src) 99.3 F (37.4 C) (Oral)  Resp 17  SpO2 96% Physical Exam  Nursing note and vitals reviewed. Constitutional: She is oriented to person, place, and time. She appears well-developed and well-nourished.  HENT:  Head: Normocephalic and atraumatic.  Right Ear: External ear normal.  Left Ear: External ear normal.  Nose: Nose normal.  Eyes: Right eye exhibits no discharge. Left eye exhibits no discharge.  Cardiovascular: Normal rate, regular rhythm and normal heart sounds.   Pulmonary/Chest: Effort normal and breath sounds normal.  Abdominal: Soft. She exhibits no distension. There is tenderness.  Diffuse abdominal tenderness, worst in RLQ. No rigidity or guarding. Soft  abdomen.  Neurological: She is alert and oriented to person, place, and time.  Skin: Skin is warm. She is diaphoretic.    ED Course  Procedures (including critical care time) Labs Review Labs Reviewed  CBC WITH DIFFERENTIAL - Abnormal; Notable for the following:    WBC 26.1 (*)    Platelets 413 (*)    All other components within normal limits  COMPREHENSIVE METABOLIC PANEL  URINALYSIS, ROUTINE W REFLEX MICROSCOPIC    Imaging Review Ct Abdomen Pelvis Wo Contrast  10/31/2013   CLINICAL DATA:  Severe right lower quadrant pain for 1 day with fever and nausea  and vomiting.  EXAM: CT ABDOMEN AND PELVIS WITHOUT CONTRAST  TECHNIQUE: Multidetector CT imaging of the abdomen and pelvis was performed following the standard protocol without IV contrast. The patient has history of IV contrast allergy. The patient did receive oral contrast material.  COMPARISON:  None.  FINDINGS: There is free extraluminal gas within the peritoneal cavity. There is mild thickening of the wall of the proximal sigmoid colon and there are amounts of gas are present in the surrounding fat consistent with perforated diverticulitis. There is no abscess. More proximally the colon exhibits no acute abnormalities.  The oral contrast has traversed the upper and mid small bowel but has not yet reached the colon. There is mild distention of proximal small bowel loops. There is considerable fluid in the cecum. There is a structure consistent with a normal appendix demonstrated on axial images 69 through 78. The terminal ileum is unremarkable.  The liver, gallbladder, pancreas, spleen, adrenal glands, and kidneys are unremarkable. The caliber of the abdominal aorta is normal. The urinary bladder is normal. There is a small amount of free pelvic fluid. The patient has undergone total abdominal hysterectomy.  The lung bases exhibit mild subsegmental atelectasis. The lumbar spine and bony pelvis exhibit no acute abnormalities.  IMPRESSION: 1. Acute perforated proximal sigmoid diverticulitis is present. There is a small amount of free air within the peritoneal cavity. There is no discrete abscess. There is free pelvic fluid. 2. A mild ileus pattern of the small bowel is present. 3. There is no acute hepatobiliary or urinary tract abnormality. 4. Critical Value/emergent results were called by telephone at the time of interpretation on 10/31/2013 at 9:02 pm to [facility] Department, who verbally acknowledged these results and will convey them to Dr. Criss AlvineGoldston who was intubating another patient at the  time.   Electronically Signed   By: David  [place]az[place]   On: 10/31/2013 21:06     EKG Interpretation None      MDM   Final diagnoses:  Diverticulitis of sigmoid colon  Perforation bowel    Patient with abdominal pain without peritonitis. Pain controlled with IV narcotics. Given zosyn due to perforation, and Dr. Dwain SarnaWakefield consulted and will admit.     Audree CamelScott T Alvira Hecht, MD 11/01/13 [phone]

## 2013-10-31 NOTE — ED Notes (Signed)
Dr. Wakefield in to assess pt at this time 

## 2013-10-31 NOTE — ED Notes (Signed)
Pt finished first bottle of contrast.  St's pain is better after pain med.  Family at bedside.

## 2013-10-31 NOTE — H&P (Signed)
Carol [name] is an 58 y.o. female.   Chief Complaint: abdominal pain HPI: 39 yof who had onset acutely right sided/periumbilical abdominal pain today that has been progressive. Nothing was relieving this at home leading her to come to er.  She has prior colonoscopy about 8 years ago that showed diverticuli only.  She has sister that has had multiple colon surgeries.  She denies fever. She had some nausea and emesis when she tried to eat.  She had a bm this am.    Past Medical History  Diagnosis Date  . Chest discomfort     , Atypical  . Systolic dysfunction     , Hyperdynamic  . Gastroesophageal reflux disease   . Hyperlipidemia   . Hypertension     Past Surgical History  Procedure Laterality Date  . Total abdominal hysterectomy      No family history on file. Social History:  reports that she has never smoked. She does not have any smokeless tobacco history on file. She reports that she drinks alcohol. She reports that she does not use illicit drugs.  Allergies:  Allergies  Allergen Reactions  . Ivp Dye [Iodinated Diagnostic Agents] Hives and Shortness Of Breath  . Lisinopril Cough  . Sulfa Antibiotics Hives    meds reviewed and in chart  Results for orders placed during the hospital encounter of 10/31/13 (from the past 48 hour(s))  CBC WITH DIFFERENTIAL     Status: Abnormal   Collection Time    10/31/13  5:05 PM      Result Value Ref Range   WBC 26.1 (*) 4.0 - 10.5 K/uL   RBC 4.95  3.87 - 5.11 MIL/uL   Hemoglobin 14.3  12.0 - 15.0 g/dL   HCT 42.5  36.0 - 46.0 %   MCV 85.9  78.0 - 100.0 fL   MCH 28.9  26.0 - 34.0 pg   MCHC 33.6  30.0 - 36.0 g/dL   RDW 13.6  11.5 - 15.5 %   Platelets 413 (*) 150 - 400 K/uL   Neutrophils Relative % 84 (*) 43 - 77 %   Lymphocytes Relative 12  12 - 46 %   Monocytes Relative 4  3 - 12 %   Eosinophils Relative 0  0 - 5 %   Basophils Relative 0  0 - 1 %   Neutro Abs 22.0 (*) 1.7 - 7.7 K/uL   Lymphs Abs 3.1  0.7 - 4.0 K/uL   Monocytes  Absolute 1.0  0.1 - 1.0 K/uL   Eosinophils Absolute 0.0  0.0 - 0.7 K/uL   Basophils Absolute 0.0  0.0 - 0.1 K/uL   WBC Morphology ATYPICAL LYMPHOCYTES     Comment: MILD LEFT SHIFT (1-5% METAS, OCC MYELO, OCC BANDS)   Smear Review LARGE PLATELETS PRESENT    COMPREHENSIVE METABOLIC PANEL     Status: Abnormal   Collection Time    10/31/13  5:05 PM      Result Value Ref Range   Sodium 138  137 - 147 mEq/L   Potassium 3.7  3.7 - 5.3 mEq/L   Chloride 96  96 - 112 mEq/L   CO2 25  19 - 32 mEq/L   Glucose, Bld 122 (*) 70 - 99 mg/dL   BUN 24 (*) 6 - 23 mg/dL   Creatinine, Ser 0.71  0.50 - 1.10 mg/dL   Calcium 9.8  8.4 - 10.5 mg/dL   Total Protein 7.8  6.0 - 8.3 g/dL   Albumin  4.0  3.5 - 5.2 g/dL   AST 20  0 - 37 U/L   ALT 22  0 - 35 U/L   Alkaline Phosphatase 73  39 - 117 U/L   Total Bilirubin 0.4  0.3 - 1.2 mg/dL   GFR calc non Af Amer >90  >90 mL/min   GFR calc Af Amer >90  >90 mL/min   Comment: (NOTE)     The eGFR has been calculated using the CKD EPI equation.     This calculation has not been validated in all clinical situations.     eGFR's persistently <90 mL/min signify possible Chronic Kidney     Disease.   Anion gap 17 (*) 5 - 15  URINALYSIS, ROUTINE W REFLEX MICROSCOPIC     Status: None   Collection Time    10/31/13  6:57 PM      Result Value Ref Range   Color, Urine YELLOW  YELLOW   APPearance CLEAR  CLEAR   Specific Gravity, Urine 1.021  1.005 - 1.030   pH 6.0  5.0 - 8.0   Glucose, UA NEGATIVE  NEGATIVE mg/dL   Hgb urine dipstick NEGATIVE  NEGATIVE   Bilirubin Urine NEGATIVE  NEGATIVE   Ketones, ur NEGATIVE  NEGATIVE mg/dL   Protein, ur NEGATIVE  NEGATIVE mg/dL   Urobilinogen, UA 1.0  0.0 - 1.0 mg/dL   Nitrite NEGATIVE  NEGATIVE   Leukocytes, UA NEGATIVE  NEGATIVE   Comment: MICROSCOPIC NOT DONE ON URINES WITH NEGATIVE PROTEIN, BLOOD, LEUKOCYTES, NITRITE, OR GLUCOSE <1000 mg/dL.   Ct Abdomen Pelvis Wo Contrast  10/31/2013   CLINICAL DATA:  Severe right lower  quadrant pain for 1 day with fever and nausea and vomiting.  EXAM: CT ABDOMEN AND PELVIS WITHOUT CONTRAST  TECHNIQUE: Multidetector CT imaging of the abdomen and pelvis was performed following the standard protocol without IV contrast. The patient has history of IV contrast allergy. The patient did receive oral contrast material.  COMPARISON:  None.  FINDINGS: There is free extraluminal gas within the peritoneal cavity. There is mild thickening of the wall of the proximal sigmoid colon and there are amounts of gas are present in the surrounding fat consistent with perforated diverticulitis. There is no abscess. More proximally the colon exhibits no acute abnormalities.  The oral contrast has traversed the upper and mid small bowel but has not yet reached the colon. There is mild distention of proximal small bowel loops. There is considerable fluid in the cecum. There is a structure consistent with a normal appendix demonstrated on axial images 69 through 78. The terminal ileum is unremarkable.  The liver, gallbladder, pancreas, spleen, adrenal glands, and kidneys are unremarkable. The caliber of the abdominal aorta is normal. The urinary bladder is normal. There is a small amount of free pelvic fluid. The patient has undergone total abdominal hysterectomy.  The lung bases exhibit mild subsegmental atelectasis. The lumbar spine and bony pelvis exhibit no acute abnormalities.  IMPRESSION: 1. Acute perforated proximal sigmoid diverticulitis is present. There is a small amount of free air within the peritoneal cavity. There is no discrete abscess. There is free pelvic fluid. 2. A mild ileus pattern of the small bowel is present. 3. There is no acute hepatobiliary or urinary tract abnormality. 4. Critical Value/emergent results were called by telephone at the time of interpretation on 10/31/2013 at 9:02 pm to [facility] in the Emergency Department, who verbally acknowledged these results and will convey them to Dr.  Regenia Skeeter who was  intubating another patient at the time.   Electronically Signed   By: David  Martinique   On: 10/31/2013 21:06    Review of Systems  Constitutional: Negative for fever and chills.  Respiratory: Negative for shortness of breath.   Cardiovascular: Negative for chest pain.  Gastrointestinal: Positive for nausea, vomiting and abdominal pain. Negative for diarrhea.  Genitourinary: Negative for dysuria, urgency and frequency.    Blood pressure 117/60, pulse 82, temperature 99.3 F (37.4 C), temperature source Oral, resp. rate 20, SpO2 94.00%. Physical Exam  Constitutional: She appears well-developed and well-nourished.  Eyes: No scleral icterus.  Neck: Neck supple.  Cardiovascular: Normal rate, regular rhythm and normal heart sounds.   Respiratory: Effort normal and breath sounds normal. She has no wheezes. She has no rales.  GI: Soft. Bowel sounds are normal. There is tenderness in the right lower quadrant and periumbilical area. There is no rigidity, no rebound and no guarding.  Lymphadenopathy:    She has no cervical adenopathy.     Assessment/Plan Perforated diverticulitis  I discussed all the options including abx/npo/serial exams, laparoscopic washout and colectomy with colostomy right now. She does have perforation with locules of free air in abdomen but she physiologically is not that ill and on exam does not have peritonitis. After long discussion she and I have decided to admit with abx, serial exams, recheck labs in am. We discussed that if she worsens or does not get better then she will need to go to or. Will use invanz this time as this is complicated disease and would like to try and convert this to a less urgent situation. I do think she is on borderline of needing to go to or but her vitals are normal and she has localized pain.  She is willing to attempt conservative treatment right now to see if this area is sealed and will improve. She understands she may very  well need surgery if not successful but I don't think we will make things worse by doing this.   Aniza Shor 10/31/2013, 10:30 PM

## 2013-10-31 NOTE — ED Notes (Signed)
Pt finished drinking contrast. CT made aware.

## 2013-10-31 NOTE — ED Notes (Signed)
Pt drinking contrast for CT at this time 

## 2013-10-31 NOTE — ED Notes (Signed)
Pt reports RLQ pain since 1030 this am that has increasingly got worse. Pt with nvd. Pt appears to be in severe pain.

## 2013-10-31 NOTE — ED Notes (Signed)
Pt drinking second bottle of contrast at this time.  Family remains at bedside.

## 2013-10-31 NOTE — ED Notes (Signed)
Pt returned from CT,  St's pain is starting to come back, rates pain #5 on pain scale 0/10

## 2013-11-01 ENCOUNTER — Inpatient Hospital Stay (HOSPITAL_COMMUNITY): Payer: 59

## 2013-11-01 LAB — BASIC METABOLIC PANEL
ANION GAP: 15 (ref 5–15)
BUN: 19 mg/dL (ref 6–23)
CO2: 27 mEq/L (ref 19–32)
Calcium: 8.3 mg/dL — ABNORMAL LOW (ref 8.4–10.5)
Chloride: 96 mEq/L (ref 96–112)
Creatinine, Ser: 0.75 mg/dL (ref 0.50–1.10)
GFR calc Af Amer: >90 mL/min (ref >90)
GLUCOSE: 126 mg/dL — AB (ref 70–99)
Potassium: 3.4 mEq/L — ABNORMAL LOW (ref 3.7–5.3)
SODIUM: 138 meq/L (ref 137–147)

## 2013-11-01 LAB — CBC
HCT: 39.1 % (ref 36.0–46.0)
Hemoglobin: 12.8 g/dL (ref 12.0–15.0)
MCH: 28.4 pg (ref 26.0–34.0)
MCHC: 32.7 g/dL (ref 30.0–36.0)
MCV: 86.9 fL (ref 78.0–100.0)
Platelets: 365 10*3/uL (ref 150–400)
RBC: 4.5 MIL/uL (ref 3.87–5.11)
RDW: 14 % (ref 11.5–15.5)
WBC: 25.5 10*3/uL — AB (ref 4.0–10.5)

## 2013-11-01 NOTE — Progress Notes (Signed)
I have seen and examined the pt and agree with PA-Osborne's progress note. No signs of peritonitis  Con't abx D/w the patient the possibility of potentially needing to go to the OR should abx not improve her course.  SHe understands Con't NPO

## 2013-11-01 NOTE — Progress Notes (Signed)
Patient ID: Carol [name], female   DOB: 08-Jan-1956, 58 y.o.   MRN: [id number]    Subjective: Patient does not feel any better today. She continues to have pretty bad right lower quadrant pain. Her nausea is okay if she lays still however it worsens if she moves around.  Objective: Vital signs in last 24 hours: Temp:  [98.6 F (37 C)-99.3 F (37.4 C)] 98.6 F (37 C) (07/24 0426) Pulse Rate:  [74-106] 97 (07/24 0426) Resp:  [17-32] 20 (07/24 0426) BP: (105-141)/(54-90) 141/90 mmHg (07/24 0426) SpO2:  [90 %-98 %] 90 % (07/24 0426) Weight:  [185 lb (83.915 kg)] 185 lb (83.915 kg) (07/24 0426) Last BM Date: 10/31/13  Intake/Output from previous day: 07/23 0701 - 07/24 0700 In: 1925 [I.V.:1925] Out: 700 [Urine:700] Intake/Output this shift:    PE: Abd: soft, the mostly tender in the right lower quadrant. Left lower quadrant and right upper quadrant are mildly tender but she states when pressing this just radiates pain back to the right lower quadrant. Hypoactive bowel sounds nondistended Heart: Regular Lungs: CTAB  Lab Results:   Recent Labs  10/31/13 1705 11/01/13 0409  WBC 26.1* 25.5*  HGB 14.3 12.8  HCT 42.5 39.1  PLT 413* 365   BMET  Recent Labs  10/31/13 1705 11/01/13 0409  NA 138 138  K 3.7 3.4*  CL 96 96  CO2 25 27  GLUCOSE 122* 126*  BUN 24* 19  CREATININE 0.71 0.75  CALCIUM 9.8 8.3*   PT/INR No results found for this basename: LABPROT, INR,  in the last 72 hours CMP     Component Value Date/Time   NA 138 11/01/2013 0409   K 3.4* 11/01/2013 0409   CL 96 11/01/2013 0409   CO2 27 11/01/2013 0409   GLUCOSE 126* 11/01/2013 0409   BUN 19 11/01/2013 0409   CREATININE 0.75 11/01/2013 0409   CALCIUM 8.3* 11/01/2013 0409   PROT 7.8 10/31/2013 1705   ALBUMIN 4.0 10/31/2013 1705   AST 20 10/31/2013 1705   ALT 22 10/31/2013 1705   ALKPHOS 73 10/31/2013 1705   BILITOT 0.4 10/31/2013 1705   GFRNONAA >90 11/01/2013 0409   GFRAA >90 11/01/2013 0409   Lipase  No  results found for this basename: lipase       Studies/Results: Ct Abdomen Pelvis Wo Contrast  10/31/2013   CLINICAL DATA:  Severe right lower quadrant pain for 1 day with fever and nausea and vomiting.  EXAM: CT ABDOMEN AND PELVIS WITHOUT CONTRAST  TECHNIQUE: Multidetector CT imaging of the abdomen and pelvis was performed following the standard protocol without IV contrast. The patient has history of IV contrast allergy. The patient did receive oral contrast material.  COMPARISON:  None.  FINDINGS: There is free extraluminal gas within the peritoneal cavity. There is mild thickening of the wall of the proximal sigmoid colon and there are amounts of gas are present in the surrounding fat consistent with perforated diverticulitis. There is no abscess. More proximally the colon exhibits no acute abnormalities.  The oral contrast has traversed the upper and mid small bowel but has not yet reached the colon. There is mild distention of proximal small bowel loops. There is considerable fluid in the cecum. There is a structure consistent with a normal appendix demonstrated on axial images 69 through 78. The terminal ileum is unremarkable.  The liver, gallbladder, pancreas, spleen, adrenal glands, and kidneys are unremarkable. The caliber of the abdominal aorta is normal. The urinary bladder is normal.  There is a small amount of free pelvic fluid. The patient has undergone total abdominal hysterectomy.  The lung bases exhibit mild subsegmental atelectasis. The lumbar spine and bony pelvis exhibit no acute abnormalities.  IMPRESSION: 1. Acute perforated proximal sigmoid diverticulitis is present. There is a small amount of free air within the peritoneal cavity. There is no discrete abscess. There is free pelvic fluid. 2. A mild ileus pattern of the small bowel is present. 3. There is no acute hepatobiliary or urinary tract abnormality. 4. Critical Value/emergent results were called by telephone at the time of  interpretation on 10/31/2013 at 9:02 pm to [facility] in [facility] Department, who verbally acknowledged these results and will convey them to Dr. Criss AlvineGoldston who was intubating another patient at the time.   Electronically Signed   By: David  [place]   On: 10/31/2013 21:06   Dg Abd Portable 2v  11/01/2013   CLINICAL DATA:  Abdominal pain.  EXAM: PORTABLE ABDOMEN - 2 VIEW  COMPARISON:  CT abdomen 10/31/2013  FINDINGS: Contrast in nondilated colon. Mildly distended loop of small bowel in the left abdomen.  Pneumoperitoneum was noted on the CT yesterday. This was a small volume pneumoperitoneum. On the decubitus view there is question of minimal pneumoperitoneum lateral to the right colon.  IMPRESSION: Question minimal pneumoperitoneum, better seen on yesterday's CT.  Negative for bowel obstruction.   Electronically Signed   By: Marlan Palauharles  Clark M.D.   On: 11/01/2013 08:05    Anti-infectives: Anti-infectives   Start     Dose/Rate Route Frequency Ordered Stop   11/01/13 0030  ertapenem (INVANZ) 1 g in sodium chloride 0.9 % 50 mL IVPB     1 g 100 mL/hr over 30 Minutes Intravenous Daily at bedtime 10/31/13 2356     10/31/13 2115  piperacillin-tazobactam (ZOSYN) IVPB 3.375 g     3.375 g 12.5 mL/hr over 240 Minutes Intravenous  Once 10/31/13 2114 11/01/13 0130       Assessment/Plan  1. Perforated diverticulitis 2. Hypertension  Plan: 1. The patient is on Invanz. We will attempt to try to give her conservative management to get better without an operation. 2. Recheck labs in the morning. 3. Continue n.p.o. Except for medications 4. Was DC'd telemetry which was never ordered and transfer the patient to [address]. We'll continue to follow her closely.  LOS: 1 day    Genova Kiner E 11/01/2013, 9:16 AM Pager: [phone]

## 2013-11-01 NOTE — Progress Notes (Signed)
Patient noted on vital sign check to have decreased oxygen sats to 86% on RA, improved somewhat with deep breaths but then drops back down, pt just recently received IV dilaudid for pain, also with temp of 100.0 orally, incentive spirometer given with instruction via teachback, encouraged 10x q hourly while awake, oxygen placed @ 2 LPM nasal cannula, will continue to monitor, Berle MullLynn Dajohn Ellender RN

## 2013-11-01 NOTE — Progress Notes (Signed)
Utilization review completed. Iokepa Geffre, RN, BSN. 

## 2013-11-01 NOTE — Progress Notes (Signed)
Nursing report given to [facility] RN on [facility], to transfer to [facility], Berle MullLynn Natally Ribera RN

## 2013-11-01 NOTE — Progress Notes (Signed)
11/01/2013 0920 Verbal order Unknown JimKelly Osbourne PAC ok to administer AM Lovenox and Metoprolol as ordered despite strict NPO and questionable surgery today. Pt updated on plan of care. Will continue to monitor patient.  Stephinie Battisti, Blanchard KelchStephanie Ingold

## 2013-11-02 LAB — CBC
HCT: 35.6 % — ABNORMAL LOW (ref 36.0–46.0)
Hemoglobin: 11.5 g/dL — ABNORMAL LOW (ref 12.0–15.0)
MCH: 28.3 pg (ref 26.0–34.0)
MCHC: 32.3 g/dL (ref 30.0–36.0)
MCV: 87.5 fL (ref 78.0–100.0)
PLATELETS: 337 10*3/uL (ref 150–400)
RBC: 4.07 MIL/uL (ref 3.87–5.11)
RDW: 14.2 % (ref 11.5–15.5)
WBC: 25 10*3/uL — ABNORMAL HIGH (ref 4.0–10.5)

## 2013-11-02 LAB — BASIC METABOLIC PANEL
Anion gap: 13 (ref 5–15)
BUN: 15 mg/dL (ref 6–23)
CO2: 27 meq/L (ref 19–32)
Calcium: 8.2 mg/dL — ABNORMAL LOW (ref 8.4–10.5)
Chloride: 98 mEq/L (ref 96–112)
Creatinine, Ser: 0.6 mg/dL (ref 0.50–1.10)
Glucose, Bld: 118 mg/dL — ABNORMAL HIGH (ref 70–99)
Potassium: 2.8 mEq/L — CL (ref 3.7–5.3)
Sodium: 138 mEq/L (ref 137–147)

## 2013-11-02 LAB — POTASSIUM: POTASSIUM: 3.1 meq/L — AB (ref 3.7–5.3)

## 2013-11-02 LAB — MRSA PCR SCREENING: MRSA by PCR: POSITIVE — AB

## 2013-11-02 MED ORDER — MUPIROCIN 2 % EX OINT
1.0000 "application " | TOPICAL_OINTMENT | Freq: Two times a day (BID) | CUTANEOUS | Status: AC
  Administered 2013-11-02 – 2013-11-06 (×10): 1 via NASAL
  Filled 2013-11-02 (×2): qty 22

## 2013-11-02 MED ORDER — POTASSIUM CHLORIDE 10 MEQ/100ML IV SOLN
10.0000 meq | INTRAVENOUS | Status: AC
  Administered 2013-11-02 (×3): 10 meq via INTRAVENOUS
  Filled 2013-11-02: qty 100

## 2013-11-02 MED ORDER — POTASSIUM CHLORIDE 10 MEQ/100ML IV SOLN
10.0000 meq | INTRAVENOUS | Status: DC
  Administered 2013-11-02: 10 meq via INTRAVENOUS
  Filled 2013-11-02: qty 100

## 2013-11-02 MED ORDER — POTASSIUM CHLORIDE IN NACL 20-0.9 MEQ/L-% IV SOLN
INTRAVENOUS | Status: AC
  Administered 2013-11-02 (×3): via INTRAVENOUS
  Administered 2013-11-03: 1000 mL via INTRAVENOUS
  Administered 2013-11-03 – 2013-11-05 (×6): via INTRAVENOUS
  Filled 2013-11-02 (×14): qty 1000

## 2013-11-02 MED ORDER — CHLORHEXIDINE GLUCONATE CLOTH 2 % EX PADS
6.0000 | MEDICATED_PAD | Freq: Every day | CUTANEOUS | Status: AC
  Administered 2013-11-02 – 2013-11-06 (×5): 6 via TOPICAL

## 2013-11-02 MED ORDER — POTASSIUM CHLORIDE 10 MEQ/100ML IV SOLN
10.0000 meq | INTRAVENOUS | Status: AC
  Administered 2013-11-02 (×3): 10 meq via INTRAVENOUS

## 2013-11-02 NOTE — Progress Notes (Signed)
Subjective: The patient states that her pain is better. She is passing flatus and had one loose stool. Voiding without difficulty.No blood. Anorexic.  98.1-100. Heart rate 74-95.  WBC still 25,000. Hemoglobin 11.5. Potassium 2.8. Creatinine 0.6. Glucose 118.  Objective: Vital signs in last 24 hours: Temp:  [98.1 F (36.7 C)-100 F (37.8 C)] 98.1 F (36.7 C) (07/25 0516) Pulse Rate:  [74-95] 74 (07/25 0516) Resp:  [16-20] 16 (07/25 0516) BP: (108-130)/(61-70) 108/62 mmHg (07/25 0516) SpO2:  [86 %-98 %] 98 % (07/25 0516) Last BM Date: 10/31/13  Intake/Output from previous day: 07/24 0701 - 07/25 0700 In: 3300 [I.V.:3000; IV Piggyback:300] Out: 650 [Urine:650] Intake/Output this shift:    General appearance: alert. Cooperative. Husband in room. Does not appear toxic but appears mildly uncomfortable. Mental status normal. Resp: clear to auscultation bilaterally GI: abdomen is reasonably soft and not distended. She does have some tenderness diffusely. I do not feel a mass. She does not have any  Lab Results:   Recent Labs  11/01/13 0409 11/02/13 0034  WBC 25.5* 25.0*  HGB 12.8 11.5*  HCT 39.1 35.6*  PLT 365 337   BMET  Recent Labs  11/01/13 0409 11/02/13 0034  NA 138 138  K 3.4* 2.8*  CL 96 98  CO2 27 27  GLUCOSE 126* 118*  BUN 19 15  CREATININE 0.75 0.60  CALCIUM 8.3* 8.2*   PT/INR No results found for this basename: LABPROT, INR,  in the last 72 hours ABG No results found for this basename: PHART, PCO2, PO2, HCO3,  in the last 72 hours  Studies/Results: Ct Abdomen Pelvis Wo Contrast  10/31/2013   CLINICAL DATA:  Severe right lower quadrant pain for 1 day with fever and nausea and vomiting.  EXAM: CT ABDOMEN AND PELVIS WITHOUT CONTRAST  TECHNIQUE: Multidetector CT imaging of the abdomen and pelvis was performed following the standard protocol without IV contrast. The patient has history of IV contrast allergy. The patient did receive oral contrast  material.  COMPARISON:  None.  FINDINGS: There is free extraluminal gas within the peritoneal cavity. There is mild thickening of the wall of the proximal sigmoid colon and there are amounts of gas are present in the surrounding fat consistent with perforated diverticulitis. There is no abscess. More proximally the colon exhibits no acute abnormalities.  The oral contrast has traversed the upper and mid small bowel but has not yet reached the colon. There is mild distention of proximal small bowel loops. There is considerable fluid in the cecum. There is a structure consistent with a normal appendix demonstrated on axial images 69 through 78. The terminal ileum is unremarkable.  The liver, gallbladder, pancreas, spleen, adrenal glands, and kidneys are unremarkable. The caliber of the abdominal aorta is normal. The urinary bladder is normal. There is a small amount of free pelvic fluid. The patient has undergone total abdominal hysterectomy.  The lung bases exhibit mild subsegmental atelectasis. The lumbar spine and bony pelvis exhibit no acute abnormalities.  IMPRESSION: 1. Acute perforated proximal sigmoid diverticulitis is present. There is a small amount of free air within the peritoneal cavity. There is no discrete abscess. There is free pelvic fluid. 2. A mild ileus pattern of the small bowel is present. 3. There is no acute hepatobiliary or urinary tract abnormality. 4. Critical Value/emergent results were called by telephone at the time of interpretation on 10/31/2013 at 9:02 pm to [facility] in the Emergency Department, who verbally acknowledged these results and will convey them  to Dr. Criss AlvineGoldston who was intubating another patient at the time.   Electronically Signed   By: David  [place]   On: 10/31/2013 21:06   Dg Abd Portable 2v  11/01/2013   CLINICAL DATA:  Abdominal pain.  EXAM: PORTABLE ABDOMEN - 2 VIEW  COMPARISON:  CT abdomen 10/31/2013  FINDINGS: Contrast in nondilated colon. Mildly distended loop  of small bowel in the left abdomen.  Pneumoperitoneum was noted on the CT yesterday. This was a small volume pneumoperitoneum. On the decubitus view there is question of minimal pneumoperitoneum lateral to the right colon.  IMPRESSION: Question minimal pneumoperitoneum, better seen on yesterday's CT.  Negative for bowel obstruction.   Electronically Signed   By: Marlan Palauharles  Clark M.D.   On: 11/01/2013 08:05    Anti-infectives: Anti-infectives   Start     Dose/Rate Route Frequency Ordered Stop   11/01/13 0030  ertapenem (INVANZ) 1 g in sodium chloride 0.9 % 50 mL IVPB     1 g 100 mL/hr over 30 Minutes Intravenous Daily at bedtime 10/31/13 2356     10/31/13 2115  piperacillin-tazobactam (ZOSYN) IVPB 3.375 g     3.375 g 12.5 mL/hr over 240 Minutes Intravenous  Once 10/31/13 2114 11/01/13 0130      Assessment/Plan:  Acute diverticulitis with perforation. Small amount of pelvic fluid but no abscess. No obstruction. She is a little bit better on Invanz, but not a lot. It is not clear whether she will avoid surgery this admission. I discussed this with her and her husband. Were going to give her one more day. N.p.o. Except ice chips. Ambulate. Reevaluate tomorrow.  she may require Hartmann resection.   LOS: 2 days    Malcomb Gangemi M 11/02/2013

## 2013-11-03 ENCOUNTER — Inpatient Hospital Stay (HOSPITAL_COMMUNITY): Payer: 59

## 2013-11-03 LAB — CBC
HCT: 33.5 % — ABNORMAL LOW (ref 36.0–46.0)
Hemoglobin: 11 g/dL — ABNORMAL LOW (ref 12.0–15.0)
MCH: 28.3 pg (ref 26.0–34.0)
MCHC: 32.8 g/dL (ref 30.0–36.0)
MCV: 86.1 fL (ref 78.0–100.0)
PLATELETS: 323 10*3/uL (ref 150–400)
RBC: 3.89 MIL/uL (ref 3.87–5.11)
RDW: 14.2 % (ref 11.5–15.5)
WBC: 21.2 10*3/uL — ABNORMAL HIGH (ref 4.0–10.5)

## 2013-11-03 LAB — BASIC METABOLIC PANEL
Anion gap: 11 (ref 5–15)
BUN: 10 mg/dL (ref 6–23)
CO2: 26 mEq/L (ref 19–32)
Calcium: 8.4 mg/dL (ref 8.4–10.5)
Chloride: 101 mEq/L (ref 96–112)
Creatinine, Ser: 0.53 mg/dL (ref 0.50–1.10)
Glucose, Bld: 108 mg/dL — ABNORMAL HIGH (ref 70–99)
POTASSIUM: 3.6 meq/L — AB (ref 3.7–5.3)
SODIUM: 138 meq/L (ref 137–147)

## 2013-11-03 NOTE — Progress Notes (Addendum)
  Subjective: Patient states that she feels better but still has pain And clearly not well. Passing flatus and had another bowel movement. Ambulating in halls.No voiding difficulty reported.  Maximum temperature 100.5. Heart rate 77. Respirations 15 unlabored. BP 133/84. WBC down a little but still 21,200. Hemoglobin 11.0.    Potassium up to 3.6.   Creatinine 0.53. Glucose 108.  Objective: Vital signs in last 24 hours: Temp:  [98 F (36.7 C)-100.5 F (38.1 C)] 99.1 F (37.3 C) (07/26 0544) Pulse Rate:  [77-86] 77 (07/26 0544) Resp:  [15-16] 15 (07/26 0544) BP: (125-133)/(59-84) 133/84 mmHg (07/26 0544) SpO2:  [94 %-96 %] 94 % (07/26 0544) Last BM Date: 11/03/13  Intake/Output from previous day: 07/25 0701 - 07/26 0700 In: 2627.1 [I.V.:2627.1] Out: 300 [Urine:300] Intake/Output this shift:    General appearance: alert and cooperative. Mental status normal. Wet towel on forehead. Not toxic appearing. Resp: clear to auscultation bilaterally GI: abdomen  is not distended but is tender right lower quadrant greater than left lower quadrant were suprapubic. Hypoactive bowel sounds. I don't feel a mass.  Lab Results:   Recent Labs  11/02/13 0034 11/03/13 0515  WBC 25.0* 21.2*  HGB 11.5* 11.0*  HCT 35.6* 33.5*  PLT 337 323   BMET  Recent Labs  11/02/13 0034 11/02/13 0950 11/03/13 0515  NA 138  --  138  K 2.8* 3.1* 3.6*  CL 98  --  101  CO2 27  --  26  GLUCOSE 118*  --  108*  BUN 15  --  10  CREATININE 0.60  --  0.53  CALCIUM 8.2*  --  8.4   PT/INR No results found for this basename: LABPROT, INR,  in the last 72 hours ABG No results found for this basename: PHART, PCO2, PO2, HCO3,  in the last 72 hours  Studies/Results: No results found.  Anti-infectives: Anti-infectives   Start     Dose/Rate Route Frequency Ordered Stop   11/01/13 0030  ertapenem (INVANZ) 1 g in sodium chloride 0.9 % 50 mL IVPB     1 g 100 mL/hr over 30 Minutes Intravenous Daily at  bedtime 10/31/13 2356     10/31/13 2115  piperacillin-tazobactam (ZOSYN) IVPB 3.375 g     3.375 g 12.5 mL/hr over 240 Minutes Intravenous  Once 10/31/13 2114 11/01/13 0130      Assessment/Plan:  Acute diverticulitis with perforation. She has made some progress over the past 3 days, but not a lot. Proceed with CT scan of abdomen stat. If she has a drainable abscess then percutaneous drainage might allow resolution. If no drainable abscess she will probably need to go [facility]. I discussed this with her and her family in detail. She is understanding and completely agreeable.  Hypokalemia. Resolved.   LOS: 3 days    Carol [name] 11/03/2013

## 2013-11-04 LAB — BASIC METABOLIC PANEL
Anion gap: 16 — ABNORMAL HIGH (ref 5–15)
BUN: 10 mg/dL (ref 6–23)
CALCIUM: 8.6 mg/dL (ref 8.4–10.5)
CO2: 23 mEq/L (ref 19–32)
Chloride: 100 mEq/L (ref 96–112)
Creatinine, Ser: 0.49 mg/dL — ABNORMAL LOW (ref 0.50–1.10)
Glucose, Bld: 64 mg/dL — ABNORMAL LOW (ref 70–99)
Potassium: 3.7 mEq/L (ref 3.7–5.3)
SODIUM: 139 meq/L (ref 137–147)

## 2013-11-04 LAB — CBC
HCT: 35.3 % — ABNORMAL LOW (ref 36.0–46.0)
Hemoglobin: 11.7 g/dL — ABNORMAL LOW (ref 12.0–15.0)
MCH: 28.9 pg (ref 26.0–34.0)
MCHC: 33.1 g/dL (ref 30.0–36.0)
MCV: 87.2 fL (ref 78.0–100.0)
Platelets: 353 10*3/uL (ref 150–400)
RBC: 4.05 MIL/uL (ref 3.87–5.11)
RDW: 14.2 % (ref 11.5–15.5)
WBC: 12.9 10*3/uL — AB (ref 4.0–10.5)

## 2013-11-04 NOTE — Progress Notes (Signed)
Patient ID: Cole D JoynerRana Snare, female   DOB: 06-19-1955, 58 y.o.   MRN: [id number]    Subjective: Patient still having pain. She states it may be a little bit better but not significantly. Her nausea is improved. She did have a bowel movement yesterday.  Objective: Vital signs in last 24 hours: Temp:  [98.3 F (36.8 C)-99.1 F (37.3 C)] 98.4 F (36.9 C) (07/27 0537) Pulse Rate:  [71-78] 71 (07/27 0537) Resp:  [15-18] 18 (07/27 0537) BP: (133-140)/(67-80) 139/72 mmHg (07/27 0537) SpO2:  [96 %-98 %] 98 % (07/27 0537) Last BM Date: 11/03/13  Intake/Output from previous day: 07/26 0701 - 07/27 0700 In: 3172.9 [P.O.:50; I.V.:3122.9] Out: -  Intake/Output this shift:    PE: Abd: Soft, diffusely tender but more focally in the right lower cautery. Hypoactive bowel sounds, nondistended. No peritoneal signs or rebound. Heart: Regular Lungs: CTAB  Lab Results:   Recent Labs  11/03/13 0515 11/04/13 0640  WBC 21.2* 12.9*  HGB 11.0* 11.7*  HCT 33.5* 35.3*  PLT 323 353   BMET  Recent Labs  11/03/13 0515 11/04/13 0640  NA 138 139  K 3.6* 3.7  CL 101 100  CO2 26 23  GLUCOSE 108* 64*  BUN 10 10  CREATININE 0.53 0.49*  CALCIUM 8.4 8.6   PT/INR No results found for this basename: LABPROT, INR,  in the last 72 hours CMP     Component Value Date/Time   NA 139 11/04/2013 0640   K 3.7 11/04/2013 0640   CL 100 11/04/2013 0640   CO2 23 11/04/2013 0640   GLUCOSE 64* 11/04/2013 0640   BUN 10 11/04/2013 0640   CREATININE 0.49* 11/04/2013 0640   CALCIUM 8.6 11/04/2013 0640   PROT 7.8 10/31/2013 1705   ALBUMIN 4.0 10/31/2013 1705   AST 20 10/31/2013 1705   ALT 22 10/31/2013 1705   ALKPHOS 73 10/31/2013 1705   BILITOT 0.4 10/31/2013 1705   GFRNONAA >90 11/04/2013 0640   GFRAA >90 11/04/2013 0640   Lipase  No results found for this basename: lipase       Studies/Results: Ct Abdomen Pelvis Wo Contrast  11/03/2013   CLINICAL DATA:  [age] year old female - followup diverticular abscess.   EXAM: CT ABDOMEN AND PELVIS WITHOUT CONTRAST  TECHNIQUE: Multidetector CT imaging of the abdomen and pelvis was performed following the standard protocol without IV contrast. Intravenous contrast was not was administered secondary to allergy.  COMPARISON:  10/31/2013 CT  FINDINGS: Very small bilateral pleural effusions and moderate bibasilar atelectasis identified.  The liver, gallbladder, spleen, pancreas, adrenal glands and kidneys are unremarkable.  Please note that parenchymal abnormalities may be missed without intravenous contrast.  Inflammation within the lower abdomen and pelvis again noted.  Ill-defined pelvic collections are again noted and not significantly changed, including the following:  A 2 x 3 cm anterior right pelvic collection (image 47)  A 4 x 5 mm ill-defined collection/ fluid within the posterior lower right pelvis (image 81)  A 2 x 3.1 cm central pelvic collection primarily containing gas with only a small amount of fluid (image 71).  No new collections/abscesses are identified.  There is no evidence of bowel obstruction, abdominal aortic aneurysm or biliary dilatation.  No acute or suspicious bony abnormalities are identified.  IMPRESSION: 3 separate ill-defined pelvic collections again noted and not significantly changed since the prior study.  Pelvic inflammation again noted.  New very small bilateral pleural effusions and moderate bibasilar atelectasis.   Electronically Signed   By:  Laveda Abbe M.D.   On: 11/03/2013 13:28    Anti-infectives: Anti-infectives   Start     Dose/Rate Route Frequency Ordered Stop   11/01/13 0030  ertapenem (INVANZ) 1 g in sodium chloride 0.9 % 50 mL IVPB     1 g 100 mL/hr over 30 Minutes Intravenous Daily at bedtime 10/31/13 2356     10/31/13 2115  piperacillin-tazobactam (ZOSYN) IVPB 3.375 g     3.375 g 12.5 mL/hr over 240 Minutes Intravenous  Once 10/31/13 2114 11/01/13 0130       Assessment/Plan  1. Perforated diverticulitis  Plan: 1. The  patient's white blood cell count finally responded better and has decreased to 12,000. She will continue on Invanz for now. She is not septic but I'm concerned given her pain is not greatly improving that she may require an operation. Dr. Lindie Spruce will evaluate her and make further recommendations for surgery versus conservative management. Continue n.p.o. for now.   LOS: 4 days    Ebbie Sorenson E 11/04/2013, 11:16 AM Pager: [phone]

## 2013-11-04 NOTE — Progress Notes (Signed)
I am suspicious after seeing her CT scan that her area of inflammation and potential perforation may not be in her colon.  She looks like she may have an area of small bowel perforation adjacent to her sigmoid colon.  Will go review the CT with a radiologist.  Marta LamasJames O. Gae BonWyatt, III, MD, FACS [phone]--pager [phone]--office [facility]

## 2013-11-05 ENCOUNTER — Encounter (HOSPITAL_COMMUNITY): Payer: 59 | Admitting: Certified Registered Nurse Anesthetist

## 2013-11-05 ENCOUNTER — Encounter (HOSPITAL_COMMUNITY): Admission: EM | Disposition: A | Discharge status: Home / Self Care | Payer: Self-pay | Source: Home / Self Care

## 2013-11-05 ENCOUNTER — Inpatient Hospital Stay (HOSPITAL_COMMUNITY): Payer: 59 | Admitting: Certified Registered Nurse Anesthetist

## 2013-11-05 ENCOUNTER — Encounter (HOSPITAL_COMMUNITY): Payer: Self-pay | Admitting: Anesthesiology

## 2013-11-05 HISTORY — PX: LAPAROTOMY: SHX154

## 2013-11-05 HISTORY — PX: BOWEL RESECTION: SHX1257

## 2013-11-05 HISTORY — PX: APPENDECTOMY: SHX54

## 2013-11-05 LAB — CBC WITH DIFFERENTIAL/PLATELET
BASOS ABS: 0 10*3/uL (ref 0.0–0.1)
Basophils Relative: 0 % (ref 0–1)
EOS ABS: 0.3 10*3/uL (ref 0.0–0.7)
Eosinophils Relative: 2 % (ref 0–5)
HEMATOCRIT: 34.3 % — AB (ref 36.0–46.0)
Hemoglobin: 11.5 g/dL — ABNORMAL LOW (ref 12.0–15.0)
LYMPHS ABS: 1.7 10*3/uL (ref 0.7–4.0)
Lymphocytes Relative: 12 % (ref 12–46)
MCH: 28.5 pg (ref 26.0–34.0)
MCHC: 33.5 g/dL (ref 30.0–36.0)
MCV: 84.9 fL (ref 78.0–100.0)
MONO ABS: 1.1 10*3/uL — AB (ref 0.1–1.0)
Monocytes Relative: 8 % (ref 3–12)
NEUTROS ABS: 11.1 10*3/uL — AB (ref 1.7–7.7)
Neutrophils Relative %: 78 % — ABNORMAL HIGH (ref 43–77)
PLATELETS: 390 10*3/uL (ref 150–400)
RBC: 4.04 MIL/uL (ref 3.87–5.11)
RDW: 14.2 % (ref 11.5–15.5)
WBC: 14.2 10*3/uL — AB (ref 4.0–10.5)

## 2013-11-05 LAB — CLOSTRIDIUM DIFFICILE BY PCR: Toxigenic C. Difficile by PCR: NEGATIVE

## 2013-11-05 LAB — ABO/RH: ABO/RH(D): O POS

## 2013-11-05 LAB — PREPARE RBC (CROSSMATCH)

## 2013-11-05 SURGERY — LAPAROTOMY, EXPLORATORY
Anesthesia: General | Site: Abdomen

## 2013-11-05 MED ORDER — DIPHENHYDRAMINE HCL 12.5 MG/5ML PO ELIX: 12.5000 mg | ORAL_SOLUTION | Freq: Four times a day (QID) | ORAL | Status: DC | PRN

## 2013-11-05 MED ORDER — ROCURONIUM BROMIDE 50 MG/5ML IV SOLN
INTRAVENOUS | Status: AC
  Filled 2013-11-05: qty 1

## 2013-11-05 MED ORDER — HYDROMORPHONE HCL PF 1 MG/ML IJ SOLN
INTRAMUSCULAR | Status: AC
  Filled 2013-11-05: qty 1

## 2013-11-05 MED ORDER — HYDROMORPHONE HCL PF 1 MG/ML IJ SOLN
0.5000 mg | Freq: Once | INTRAMUSCULAR | Status: AC
  Administered 2013-11-05: 0.5 mg via INTRAVENOUS

## 2013-11-05 MED ORDER — PROPOFOL 10 MG/ML IV BOLUS
INTRAVENOUS | Status: DC | PRN
  Administered 2013-11-05: 200 mg via INTRAVENOUS

## 2013-11-05 MED ORDER — FENTANYL CITRATE 0.05 MG/ML IJ SOLN
INTRAMUSCULAR | Status: AC
  Filled 2013-11-05: qty 5

## 2013-11-05 MED ORDER — NALOXONE HCL 0.4 MG/ML IJ SOLN: 0.4000 mg | INTRAMUSCULAR | Status: DC | PRN

## 2013-11-05 MED ORDER — ACETAMINOPHEN 10 MG/ML IV SOLN
1000.0000 mg | Freq: Once | INTRAVENOUS | Status: AC
  Administered 2013-11-05: 1000 mg via INTRAVENOUS
  Filled 2013-11-05: qty 100

## 2013-11-05 MED ORDER — ONDANSETRON HCL 4 MG/2ML IJ SOLN: 4.0000 mg | Freq: Four times a day (QID) | INTRAMUSCULAR | Status: DC | PRN

## 2013-11-05 MED ORDER — ROCURONIUM BROMIDE 100 MG/10ML IV SOLN
INTRAVENOUS | Status: DC | PRN
  Administered 2013-11-05: 10 mg via INTRAVENOUS
  Administered 2013-11-05: 30 mg via INTRAVENOUS
  Administered 2013-11-05: 10 mg via INTRAVENOUS

## 2013-11-05 MED ORDER — ONDANSETRON HCL 4 MG/2ML IJ SOLN
4.0000 mg | Freq: Once | INTRAMUSCULAR | Status: AC | PRN
  Administered 2013-11-05: 4 mg via INTRAVENOUS

## 2013-11-05 MED ORDER — LIDOCAINE HCL (CARDIAC) 20 MG/ML IV SOLN
INTRAVENOUS | Status: AC
  Filled 2013-11-05: qty 10

## 2013-11-05 MED ORDER — ENOXAPARIN SODIUM 40 MG/0.4ML ~~LOC~~ SOLN
40.0000 mg | SUBCUTANEOUS | Status: DC
  Administered 2013-11-06 – 2013-11-13 (×8): 40 mg via SUBCUTANEOUS
  Filled 2013-11-05 (×9): qty 0.4

## 2013-11-05 MED ORDER — LACTATED RINGERS IV SOLN
INTRAVENOUS | Status: DC | PRN
  Administered 2013-11-05 (×3): via INTRAVENOUS

## 2013-11-05 MED ORDER — NEOSTIGMINE METHYLSULFATE 10 MG/10ML IV SOLN
INTRAVENOUS | Status: DC | PRN
  Administered 2013-11-05: 4 mg via INTRAVENOUS

## 2013-11-05 MED ORDER — CEFAZOLIN SODIUM-DEXTROSE 2-3 GM-% IV SOLR
INTRAVENOUS | Status: DC | PRN
  Administered 2013-11-05: 2 g via INTRAVENOUS

## 2013-11-05 MED ORDER — 0.9 % SODIUM CHLORIDE (POUR BTL) OPTIME
TOPICAL | Status: DC | PRN
  Administered 2013-11-05 (×2): 1000 mL

## 2013-11-05 MED ORDER — HYDROMORPHONE HCL PF 1 MG/ML IJ SOLN
0.2500 mg | INTRAMUSCULAR | Status: DC | PRN
Start: 2013-11-05 — End: 2013-11-05
  Administered 2013-11-05 (×4): 0.5 mg via INTRAVENOUS

## 2013-11-05 MED ORDER — SODIUM CHLORIDE 0.9 % IJ SOLN: 9.0000 mL | INTRAMUSCULAR | Status: DC | PRN

## 2013-11-05 MED ORDER — PROPOFOL 10 MG/ML IV BOLUS
INTRAVENOUS | Status: AC
  Filled 2013-11-05: qty 20

## 2013-11-05 MED ORDER — ONDANSETRON HCL 4 MG/2ML IJ SOLN
INTRAMUSCULAR | Status: DC | PRN
  Administered 2013-11-05 (×2): 4 mg via INTRAVENOUS

## 2013-11-05 MED ORDER — SUCCINYLCHOLINE CHLORIDE 20 MG/ML IJ SOLN
INTRAMUSCULAR | Status: AC
  Filled 2013-11-05: qty 1

## 2013-11-05 MED ORDER — PHENYLEPHRINE 40 MCG/ML (10ML) SYRINGE FOR IV PUSH (FOR BLOOD PRESSURE SUPPORT)
PREFILLED_SYRINGE | INTRAVENOUS | Status: AC
  Filled 2013-11-05: qty 10

## 2013-11-05 MED ORDER — DEXAMETHASONE SODIUM PHOSPHATE 4 MG/ML IJ SOLN
INTRAMUSCULAR | Status: DC | PRN
  Administered 2013-11-05: 4 mg via INTRAVENOUS

## 2013-11-05 MED ORDER — ENOXAPARIN SODIUM 40 MG/0.4ML ~~LOC~~ SOLN
40.0000 mg | Freq: Every day | SUBCUTANEOUS | Status: DC
  Filled 2013-11-05: qty 0.4

## 2013-11-05 MED ORDER — HYDROMORPHONE 0.3 MG/ML IV SOLN
INTRAVENOUS | Status: DC
  Administered 2013-11-05: 25 mL via INTRAVENOUS
  Administered 2013-11-05: 23:00:00 via INTRAVENOUS
  Administered 2013-11-05: 3.6 mg via INTRAVENOUS
  Administered 2013-11-06: 6.84 mg via INTRAVENOUS
  Administered 2013-11-06: 3 mg via INTRAVENOUS
  Administered 2013-11-06: 08:00:00 via INTRAVENOUS
  Administered 2013-11-06: 7.5 mg via INTRAVENOUS
  Administered 2013-11-07: 2 mg via INTRAVENOUS
  Administered 2013-11-07: 4.5 mg via INTRAVENOUS
  Administered 2013-11-07: 3 mg via INTRAVENOUS
  Administered 2013-11-07 (×2): via INTRAVENOUS
  Administered 2013-11-07: 2 mg via INTRAVENOUS
  Administered 2013-11-07: 2.7 mg via INTRAVENOUS
  Administered 2013-11-08: 0.9 mg via INTRAVENOUS
  Administered 2013-11-08: 06:00:00 via INTRAVENOUS
  Administered 2013-11-08: 0.9 mg via INTRAVENOUS
  Administered 2013-11-08: 0.6 mg via INTRAVENOUS
  Filled 2013-11-05 (×6): qty 25

## 2013-11-05 MED ORDER — LIDOCAINE HCL (CARDIAC) 20 MG/ML IV SOLN
INTRAVENOUS | Status: DC | PRN
  Administered 2013-11-05: 100 mg via INTRAVENOUS

## 2013-11-05 MED ORDER — GLYCOPYRROLATE 0.2 MG/ML IJ SOLN
INTRAMUSCULAR | Status: DC | PRN
  Administered 2013-11-05: 0.6 mg via INTRAVENOUS

## 2013-11-05 MED ORDER — POVIDONE-IODINE 10 % EX OINT
TOPICAL_OINTMENT | CUTANEOUS | Status: AC
  Filled 2013-11-05: qty 28.35

## 2013-11-05 MED ORDER — LACTATED RINGERS IV SOLN
INTRAVENOUS | Status: DC
  Administered 2013-11-05: 13:00:00 via INTRAVENOUS

## 2013-11-05 MED ORDER — METOPROLOL TARTRATE 1 MG/ML IV SOLN
5.0000 mg | Freq: Four times a day (QID) | INTRAVENOUS | Status: DC | PRN
Start: 2013-11-05 — End: 2013-11-06

## 2013-11-05 MED ORDER — FENTANYL CITRATE 0.05 MG/ML IJ SOLN
INTRAMUSCULAR | Status: DC | PRN
  Administered 2013-11-05 (×6): 50 ug via INTRAVENOUS
  Administered 2013-11-05 (×2): 100 ug via INTRAVENOUS
  Administered 2013-11-05: 50 ug via INTRAVENOUS
  Administered 2013-11-05: 150 ug via INTRAVENOUS
  Administered 2013-11-05: 50 ug via INTRAVENOUS

## 2013-11-05 MED ORDER — SUCCINYLCHOLINE CHLORIDE 20 MG/ML IJ SOLN
INTRAMUSCULAR | Status: DC | PRN
  Administered 2013-11-05: 180 mg via INTRAVENOUS

## 2013-11-05 MED ORDER — ENOXAPARIN SODIUM 40 MG/0.4ML ~~LOC~~ SOLN: 40.0000 mg | Freq: Every day | SUBCUTANEOUS | Status: DC

## 2013-11-05 MED ORDER — HYDROMORPHONE 0.3 MG/ML IV SOLN
INTRAVENOUS | Status: AC
  Filled 2013-11-05: qty 25

## 2013-11-05 MED ORDER — ONDANSETRON HCL 4 MG/2ML IJ SOLN
INTRAMUSCULAR | Status: AC
  Filled 2013-11-05: qty 2

## 2013-11-05 MED ORDER — DIPHENHYDRAMINE HCL 50 MG/ML IJ SOLN: 12.5000 mg | Freq: Four times a day (QID) | INTRAMUSCULAR | Status: DC | PRN

## 2013-11-05 SURGICAL SUPPLY — 58 items
BLADE SURG ROTATE 9660 (MISCELLANEOUS) ×2 IMPLANT
BNDG GAUZE ELAST 4 BULKY (GAUZE/BANDAGES/DRESSINGS) ×2 IMPLANT
CANISTER SUCTION 2500CC (MISCELLANEOUS) ×3 IMPLANT
CHLORAPREP W/TINT 26ML (MISCELLANEOUS) ×3 IMPLANT
COVER MAYO STAND STRL (DRAPES) ×6 IMPLANT
COVER SURGICAL LIGHT HANDLE (MISCELLANEOUS) ×3 IMPLANT
DRAIN CHANNEL 19F RND (DRAIN) ×2 IMPLANT
DRAPE LAPAROSCOPIC ABDOMINAL (DRAPES) ×3 IMPLANT
DRAPE PROXIMA HALF (DRAPES) ×6 IMPLANT
DRAPE UTILITY 15X26 W/TAPE STR (DRAPE) ×15 IMPLANT
DRAPE WARM FLUID 44X44 (DRAPE) ×3 IMPLANT
DRSG OPSITE POSTOP 4X10 (GAUZE/BANDAGES/DRESSINGS) IMPLANT
DRSG OPSITE POSTOP 4X8 (GAUZE/BANDAGES/DRESSINGS) IMPLANT
DRSG PAD ABDOMINAL 8X10 ST (GAUZE/BANDAGES/DRESSINGS) ×4 IMPLANT
ELECT BLADE 6.5 EXT (BLADE) ×3 IMPLANT
ELECT CAUTERY BLADE 6.4 (BLADE) ×6 IMPLANT
ELECT REM PT RETURN 9FT ADLT (ELECTROSURGICAL) ×3
ELECTRODE REM PT RTRN 9FT ADLT (ELECTROSURGICAL) ×2 IMPLANT
EVACUATOR SILICONE 100CC (DRAIN) ×2 IMPLANT
GLOVE BIO SURGEON STRL SZ8 (GLOVE) ×4 IMPLANT
GLOVE BIOGEL PI IND STRL 8 (GLOVE) ×5 IMPLANT
GLOVE BIOGEL PI INDICATOR 8 (GLOVE) ×3
GLOVE ECLIPSE 7.5 STRL STRAW (GLOVE) ×8 IMPLANT
GOWN STRL REUS W/ TWL LRG LVL3 (GOWN DISPOSABLE) ×12 IMPLANT
GOWN STRL REUS W/TWL LRG LVL3 (GOWN DISPOSABLE) ×18
KIT BASIN OR (CUSTOM PROCEDURE TRAY) ×3 IMPLANT
KIT OSTOMY DRAINABLE 2.75 STR (WOUND CARE) ×2 IMPLANT
KIT ROOM TURNOVER OR (KITS) ×3 IMPLANT
MARKER SKIN DUAL TIP RULER LAB (MISCELLANEOUS) ×2 IMPLANT
NS IRRIG 1000ML POUR BTL (IV SOLUTION) ×6 IMPLANT
PACK GENERAL/GYN (CUSTOM PROCEDURE TRAY) ×3 IMPLANT
PAD ARMBOARD 7.5X6 YLW CONV (MISCELLANEOUS) ×3 IMPLANT
PENCIL BUTTON HOLSTER BLD 10FT (ELECTRODE) ×3 IMPLANT
RELOAD PROXIMATE 75MM BLUE (ENDOMECHANICALS) ×9 IMPLANT
RELOAD PROXIMATE TA60MM BLUE (ENDOMECHANICALS) ×6 IMPLANT
RELOAD STAPLE 60 BLU REG PROX (ENDOMECHANICALS) IMPLANT
RELOAD STAPLE 75 3.8 BLU REG (ENDOMECHANICALS) IMPLANT
SPECIMEN JAR X LARGE (MISCELLANEOUS) ×3 IMPLANT
SPONGE LAP 18X18 X RAY DECT (DISPOSABLE) ×2 IMPLANT
STAPLER GUN LINEAR PROX 60 (STAPLE) ×2 IMPLANT
STAPLER VISISTAT 35W (STAPLE) ×3 IMPLANT
SUCTION POOLE TIP (SUCTIONS) ×3 IMPLANT
SUT CHROMIC 0 BP (SUTURE) ×2 IMPLANT
SUT ETHILON 2 0 FS 18 (SUTURE) ×2 IMPLANT
SUT PDS AB 1 TP1 96 (SUTURE) ×6 IMPLANT
SUT SILK 2 0 SH CR/8 (SUTURE) ×5 IMPLANT
SUT SILK 2 0 TIES 10X30 (SUTURE) ×3 IMPLANT
SUT SILK 3 0 SH CR/8 (SUTURE) ×3 IMPLANT
SUT SILK 3 0 TIES 10X30 (SUTURE) ×3 IMPLANT
SUT VIC AB 3-0 SH 18 (SUTURE) ×2 IMPLANT
SWAB COLLECTION DEVICE MRSA (MISCELLANEOUS) ×2 IMPLANT
SYR BULB IRRIGATION 50ML (SYRINGE) ×3 IMPLANT
TAPE CLOTH SURG 6X10 WHT LF (GAUZE/BANDAGES/DRESSINGS) ×2 IMPLANT
TOWEL OR 17X26 10 PK STRL BLUE (TOWEL DISPOSABLE) ×6 IMPLANT
TRAY FOLEY CATH 14FRSI W/METER (CATHETERS) ×2 IMPLANT
TUBE ANAEROBIC SPECIMEN COL (MISCELLANEOUS) ×2 IMPLANT
TUBE CONNECTING 12X1/4 (SUCTIONS) ×3 IMPLANT
YANKAUER SUCT BULB TIP NO VENT (SUCTIONS) ×3 IMPLANT

## 2013-11-05 NOTE — Progress Notes (Signed)
Discussed PICC placement with patient and family including risks, benefits, and alternatives. Family asked to wait until AM since she was still sore and nauseated from her surgical procedure. Primary RN notified.

## 2013-11-05 NOTE — Anesthesia Preprocedure Evaluation (Signed)
Anesthesia Evaluation  Patient identified by MRN, date of birth, ID band Patient awake    Reviewed: Allergy & Precautions, H&P , NPO status , Patient's Chart, lab work & pertinent test results  Airway       Dental   Pulmonary          Cardiovascular hypertension,     Neuro/Psych    GI/Hepatic GERD-  ,  Endo/Other    Renal/GU      Musculoskeletal   Abdominal   Peds  Hematology   Anesthesia Other Findings Perforated diverticuli  Reproductive/Obstetrics                           Anesthesia Physical Anesthesia Plan  ASA: II  Anesthesia Plan: General   Post-op Pain Management:    Induction: Intravenous  Airway Management Planned: Oral ETT  Additional Equipment:   Intra-op Plan:   Post-operative Plan: Extubation in OR  Informed Consent: I have reviewed the patients History and Physical, chart, labs and discussed the procedure including the risks, benefits and alternatives for the proposed anesthesia with the patient or authorized representative who has indicated his/her understanding and acceptance.     Plan Discussed with: CRNA, Anesthesiologist and Surgeon  Anesthesia Plan Comments:         Anesthesia Quick Evaluation

## 2013-11-05 NOTE — Transfer of Care (Signed)
Immediate Anesthesia Transfer of Care Note  Patient: Carol SnareJill D [name]  Procedure(s) Performed: Procedure(s): EXPLORATORY LAPAROTOMY (N/A) APPENDECTOMY (N/A) SMALL BOWEL RESECTION (N/A)  Patient Location: PACU  Anesthesia Type:General  Level of Consciousness: awake, alert , oriented and patient cooperative  Airway & Oxygen Therapy: Patient Spontanous Breathing and Patient connected to nasal cannula oxygen  Post-op Assessment: Report given to PACU RN and Post -op Vital signs reviewed and stable  Post vital signs: Reviewed and stable  Complications: No apparent anesthesia complications

## 2013-11-05 NOTE — Anesthesia Postprocedure Evaluation (Signed)
  Anesthesia Post-op Note  Patient: Carol [name]  Procedure(s) Performed: Procedure(s): EXPLORATORY LAPAROTOMY (N/A) APPENDECTOMY (N/A) SMALL BOWEL RESECTION (N/A)  Patient Location: PACU  Anesthesia Type:General  Level of Consciousness: awake, oriented, sedated and patient cooperative  Airway and Oxygen Therapy: Patient Spontanous Breathing  Post-op Pain: mild  Post-op Assessment: Post-op Vital signs reviewed, Patient's Cardiovascular Status Stable, Respiratory Function Stable, Patent Airway and No signs of Nausea or vomiting  Post-op Vital Signs: stable  Last Vitals:  Filed Vitals:   11/05/13 1533  BP:   Pulse:   Temp: 37 C  Resp:     Complications: No apparent anesthesia complications

## 2013-11-05 NOTE — Progress Notes (Signed)
Paged Dr. Derrell Lollingamirez paged at this time for pt's ongoing pain that is not relieved by PCA.

## 2013-11-05 NOTE — Progress Notes (Signed)
Pharmacy received orders for TPN.  Labs ordered, and TPN will be started tomorrow.   Thank you!  Tad MooreJessica Radley Teston, Pharm D, BCPS  Clinical Pharmacist Pager [phone]  11/05/2013 5:58 PM

## 2013-11-05 NOTE — Progress Notes (Signed)
Patient's tenderness is much worse today.  She continues to be afebrile, but pain and tenderness is much worse.  Will go ahead with colectomy and colostomy today.  Will have the patient marked for her colostomy.  Marta LamasJames O. Gae BonWyatt, III, MD, FACS [phone]--pager [phone]--office [facility]

## 2013-11-05 NOTE — Op Note (Signed)
OPERATIVE REPORT  DATE OF OPERATION:  11/05/2013  PATIENT:  Carol SnareJill D Ketelsen  58 y.o. female  PRE-OPERATIVE DIAGNOSIS:  Perforated diverticulitis  POST-OPERATIVE DIAGNOSIS:  Interloop small bowel abscess with small bowel diverticulae No evidence of acute perforated diverticulitis  PROCEDURE:  Procedure(s): EXPLORATORY LAPAROTOMY APPENDECTOMY SMALL BOWEL RESECTION  SURGEON:  Surgeon(s): Cherylynn RidgesJames O Trayton Szabo, MD Liz MaladyBurke E Thompson, MD  ASSISTANT: Janee Mornhompson  ANESTHESIA:   general  EBL: 100 ml  BLOOD ADMINISTERED: none  DRAINS: Nasogastric Tube, Urinary Catheter (Foley) and (1) Blake drain(s) in the pelvis and LLQ   SPECIMEN:  Source of Specimen:  resected small bowel, appendix, peritoneal pus for microbilogy  COUNTS CORRECT:  YES  PROCEDURE DETAILS: The patient was taken into the operating room and placed on the table in the supine position. After an adequate general endotracheal anesthetic was administered she was prepped and draped in the usual sterile manner exposing her entire abdomen.  A proper timeout was performed identifying the patient and the procedure to be performed. A midline incision was made between the umbilicus and the pubic crest using a #10 blade. We dissected down through the deep subcutaneous tissue down to and through the midline fascia. We entered the peritoneal cavity where immediately we encountered a large amount of mucopurulent fluid coming primarily from the midline and the right lower quadrant of the abdomen. There are multiple loops of matted small bowel around this loculated interloop abscesses of the small bowel. To move towards the right side of the abdomen away from the sigmoid colon which.near the area and was somewhat adhered to this abscess. It did not apper as though the the sigmoid colon had perforated in the to the development of this abscess.  Entire sigmoid colon including the area of it was attached to the abscess appeared to be soft and not inflamed.  Some of the fatty material around the sigmoid colon of the of the colon itself was soft and appeared to be viable arises cecum slightly and saw the appendix which had a significant amount of periappendicitis. There was a large amount of loculated fluid in the pelvis and in the right paracolic gutter.  As we investigated the terminal ileum back proximally towards the posterior portion of the terminal ileum another pocket of matted loops of small bowel were encountered and as we freed them apart from each other came across another abscess. The small bowel in this area appeared to be significantly inflamed and abnormal. There was no evidence of creeping fat or other evidence grossly of Crohn's disease.  The decision to be made as to whether not this is a primary enteritis with an interloop abscess or sigmoid diverticulitis. It was decided after reviewing the CT scan and looking at the anatomy that this does appear to be a primary small bowel process.  We did a small bowel resection of the markedly inflamed and thickened small bowel with interloop abscess anastomoses using a GIA-75 stapler. The resulting enterotomy was closed using a TX 60 stapler.Once that was done we inspected the sigmoid colon once again and found there to be no evidence of primary disease or perforation.  A 19 [place] Blake drain was placed from the left lower quadrant of the abdomen down into the pelvis looping back up into the left paracolic area. This was secured in place with a 3-0 nylon suture. We irrigated with 2 L of warm saline solution. No other interloop abscesses were noted as we ran the small bowel proximal to the ligament  of Treitz. We palpated the stomach. NG tube to be in adequate position from the anesthesiologist. We get a inspected  the pelvis and right lower quadrant and the left low quadrant found there to be no other evidence of pathology.  The appendix which had been abnormal was taken at its base using the TA 60  stapler however we subsequently ligated it with a 0 chromic suture and inverted into the base of the cecum using a pursestring suture of 2-0 silk. Once we irrigated and taken out the appendix because the abdomen. The fascia was reapproximated using running looped #1 PDS suture. The skin was left open and the wound packed with saline soaked Kerlix gauze. All needle counts, sponge counts, and instrument counts were correct.  PATIENT DISPOSITION:  PACU - hemodynamically stable.   Cherylynn Ridges 7/28/20153:30 PM

## 2013-11-05 NOTE — Anesthesia Procedure Notes (Signed)
Procedure Name: Intubation Date/Time: 11/05/2013 1:33 PM Performed by: Orvilla FusATO, Kailiana Granquist A Pre-anesthesia Checklist: Patient identified, Timeout performed, Emergency Drugs available, Suction available and Patient being monitored Patient Re-evaluated:Patient Re-evaluated prior to inductionOxygen Delivery Method: Circle system utilized Preoxygenation: Pre-oxygenation with 100% oxygen Intubation Type: IV induction Ventilation: Mask ventilation without difficulty Laryngoscope Size: Mac and 3 Grade View: Grade II Tube type: Oral Tube size: 7.0 mm Number of attempts: 1 Airway Equipment and Method: Stylet Placement Confirmation: ETT inserted through vocal cords under direct vision,  breath sounds checked- equal and bilateral and positive ETCO2 Secured at: 21 cm Tube secured with: Tape Dental Injury: Teeth and Oropharynx as per pre-operative assessment

## 2013-11-05 NOTE — Progress Notes (Signed)
Orthopedic Tech Progress Note Patient Details:  Carol SnareJill D [name] 09/21/1955 [id number]  Ortho Devices Type of Ortho Device: Abdominal binder Ortho Device/Splint Interventions: Casandra DoffingOrdered   Aysa Larivee Craig 11/05/2013, 8:19 PM

## 2013-11-05 NOTE — Progress Notes (Signed)
Pt states pain worse this shift, pt did not rest much over night.

## 2013-11-05 NOTE — Consult Note (Addendum)
WOC ostomy consult: Consult requested for preoperative stoma site marking.  Pt scheduled for colostomy surgery today.  Assessed abdomen while lying and sitting on the edge of the bed.  Mark placed within line of vision, in area free from folds, within rectus muscles.  Site located 5 cm to left of umbilicus and 5 cm below.  Educational materials left at bedside and demonstrated pouch appearance to patient.  She is familiar with pouching routines since her sister recently had this type of surgery performed at another facility.  Plan to follow after surgery for educational sessions.  Pt denies further questions at this time. Cammie Mcgeeawn Kamarie Veno MSN, RN, CWOCN, [place]CN-AP, CNS [phone]

## 2013-11-06 ENCOUNTER — Encounter (HOSPITAL_COMMUNITY): Payer: Self-pay | Admitting: General Surgery

## 2013-11-06 DIAGNOSIS — E46 Unspecified protein-calorie malnutrition: Secondary | ICD-10-CM

## 2013-11-06 LAB — COMPREHENSIVE METABOLIC PANEL
ALT: 15 U/L (ref 0–35)
AST: 20 U/L (ref 0–37)
Albumin: 2.1 g/dL — ABNORMAL LOW (ref 3.5–5.2)
Alkaline Phosphatase: 71 U/L (ref 39–117)
Anion gap: 22 — ABNORMAL HIGH (ref 5–15)
BUN: 7 mg/dL (ref 6–23)
CALCIUM: 8.6 mg/dL (ref 8.4–10.5)
CO2: 17 mEq/L — ABNORMAL LOW (ref 19–32)
Chloride: 101 mEq/L (ref 96–112)
Creatinine, Ser: 0.48 mg/dL — ABNORMAL LOW (ref 0.50–1.10)
GFR calc Af Amer: >90 mL/min (ref >90)
GFR calc non Af Amer: >90 mL/min (ref >90)
Glucose, Bld: 105 mg/dL — ABNORMAL HIGH (ref 70–99)
Potassium: 4.5 mEq/L (ref 3.7–5.3)
SODIUM: 140 meq/L (ref 137–147)
TOTAL PROTEIN: 6.1 g/dL (ref 6.0–8.3)
Total Bilirubin: 0.3 mg/dL (ref 0.3–1.2)

## 2013-11-06 LAB — CBC
HCT: 38.3 % (ref 36.0–46.0)
Hemoglobin: 12.8 g/dL (ref 12.0–15.0)
MCH: 28.4 pg (ref 26.0–34.0)
MCHC: 33.4 g/dL (ref 30.0–36.0)
MCV: 85.1 fL (ref 78.0–100.0)
PLATELETS: 459 10*3/uL — AB (ref 150–400)
RBC: 4.5 MIL/uL (ref 3.87–5.11)
RDW: 14.8 % (ref 11.5–15.5)
WBC: 19.5 10*3/uL — ABNORMAL HIGH (ref 4.0–10.5)

## 2013-11-06 LAB — MAGNESIUM: Magnesium: 2 mg/dL (ref 1.5–2.5)

## 2013-11-06 LAB — GLUCOSE, CAPILLARY: Glucose-Capillary: 124 mg/dL — ABNORMAL HIGH (ref 70–99)

## 2013-11-06 LAB — PHOSPHORUS: PHOSPHORUS: 4.3 mg/dL (ref 2.3–4.6)

## 2013-11-06 MED ORDER — FAT EMULSION 20 % IV EMUL
250.0000 mL | INTRAVENOUS | Status: AC
  Administered 2013-11-06: 250 mL via INTRAVENOUS
  Filled 2013-11-06: qty 250

## 2013-11-06 MED ORDER — METHOCARBAMOL 1000 MG/10ML IJ SOLN
1000.0000 mg | Freq: Three times a day (TID) | INTRAVENOUS | Status: DC
  Administered 2013-11-06 – 2013-11-11 (×14): 1000 mg via INTRAVENOUS
  Filled 2013-11-06 (×21): qty 10

## 2013-11-06 MED ORDER — ESTRADIOL 0.05 MG/24HR TD PTWK
0.0500 mg | MEDICATED_PATCH | TRANSDERMAL | Status: DC
  Administered 2013-11-08: 0.05 mg via TRANSDERMAL
  Filled 2013-11-06: qty 1

## 2013-11-06 MED ORDER — INSULIN ASPART 100 UNIT/ML ~~LOC~~ SOLN
0.0000 [IU] | SUBCUTANEOUS | Status: DC
  Administered 2013-11-06 – 2013-11-09 (×9): 1 [IU] via SUBCUTANEOUS

## 2013-11-06 MED ORDER — SODIUM CHLORIDE 0.9 % IJ SOLN
10.0000 mL | INTRAMUSCULAR | Status: DC | PRN
  Administered 2013-11-07 – 2013-11-12 (×5): 10 mL

## 2013-11-06 MED ORDER — ACETAMINOPHEN 10 MG/ML IV SOLN
1000.0000 mg | Freq: Four times a day (QID) | INTRAVENOUS | Status: AC
  Administered 2013-11-06 – 2013-11-07 (×4): 1000 mg via INTRAVENOUS
  Filled 2013-11-06 (×4): qty 100

## 2013-11-06 MED ORDER — METOPROLOL TARTRATE 1 MG/ML IV SOLN
5.0000 mg | Freq: Four times a day (QID) | INTRAVENOUS | Status: DC
  Administered 2013-11-06 – 2013-11-13 (×26): 5 mg via INTRAVENOUS
  Filled 2013-11-06 (×36): qty 5

## 2013-11-06 MED ORDER — POTASSIUM CHLORIDE IN NACL 20-0.9 MEQ/L-% IV SOLN
INTRAVENOUS | Status: AC
  Administered 2013-11-06: 22:00:00 via INTRAVENOUS
  Filled 2013-11-06 (×7): qty 1000

## 2013-11-06 MED ORDER — TRACE MINERALS CR-CU-F-FE-I-MN-MO-SE-ZN IV SOLN
INTRAVENOUS | Status: AC
  Administered 2013-11-06: 18:00:00 via INTRAVENOUS
  Filled 2013-11-06: qty 1000

## 2013-11-06 NOTE — Progress Notes (Signed)
Peripherally Inserted Central Catheter/Midline Placement  The IV Nurse has discussed with the patient and/or persons authorized to consent for the patient, the purpose of this procedure and the potential benefits and risks involved with this procedure.  The benefits include less needle sticks, lab draws from the catheter and patient may be discharged home with the catheter.  Risks include, but not limited to, infection, bleeding, blood clot (thrombus formation), and puncture of an artery; nerve damage and irregular heat beat.  Alternatives to this procedure were also discussed.  PICC/Midline Placement Documentation  PICC / Midline Double Lumen 11/06/13 PICC Right Basilic 43 cm 1 cm (Active)  Indication for Insertion or Continuance of Line Home intravenous therapies (PICC only) 11/06/2013 10:00 AM  Exposed Catheter (cm) 1 cm 11/06/2013 10:00 AM  Dressing Change Due 11/13/13 11/06/2013 10:00 AM       Stacie GlazeJoyce, Suzette Flagler Horton 11/06/2013, 10:34 AM

## 2013-11-06 NOTE — Progress Notes (Addendum)
PARENTERAL NUTRITION CONSULT NOTE - INITIAL  Pharmacy Consult for TPN Indication: prolonged ileus  Allergies  Allergen Reactions  . Ivp Dye [Iodinated Diagnostic Agents] Hives and Shortness Of Breath  . Lisinopril Cough  . Sulfa Antibiotics Hives    Patient Measurements: Height: 5\' 3"  (160 cm) Weight: 185 lb (83.915 kg) IBW/kg (Calculated) : 52.4   Vital Signs: Temp: 98.1 F (36.7 C) (07/29 0659) Temp src: Oral (07/29 0659) BP: 183/87 mmHg (07/29 0659) Pulse Rate: 85 (07/29 0659) Intake/Output from previous day: 07/28 0701 - 07/29 0700 In: 3912 [I.V.:3612; IV Piggyback:300] Out: 2130 [Urine:1650; Emesis/NG output:100; Drains:180; Blood:200] Intake/Output from this shift:    Labs:  Recent Labs  11/04/13 0640 11/05/13 0820 11/06/13 0437  WBC 12.9* 14.2* 19.5*  HGB 11.7* 11.5* 12.8  HCT 35.3* 34.3* 38.3  PLT 353 390 459*     Recent Labs  11/04/13 0640 11/06/13 0437  NA 139 140  K 3.7 4.5  CL 100 101  CO2 23 17*  GLUCOSE 64* 105*  BUN 10 7  CREATININE 0.49* 0.48*  CALCIUM 8.6 8.6  MG  --  2.0  PHOS  --  4.3  PROT  --  6.1  ALBUMIN  --  2.1*  AST  --  20  ALT  --  15  ALKPHOS  --  71  BILITOT  --  0.3   Estimated Creatinine Clearance: 78.7 ml/min (by C-G formula based on Cr of 0.48).   No results found for this basename: GLUCAP,  in the last 72 hours  Medical History: Past Medical History  Diagnosis Date  . Chest discomfort     , Atypical  . Systolic dysfunction     , Hyperdynamic  . Gastroesophageal reflux disease   . Hyperlipidemia   . Hypertension    Admit: 58 yo lady admitted with abdominal pain found to have perforated diverticulitis. She had a lap chole with SBR on 7/28.   GI: NPO, baseline alb 2.1  NG output 100cc, LBM 7/27 Endo: no h/o DM  Am glu 105 Lytes: K 4.5, Na 140, Mag 2.0, Phos 4.3  Renal: SrCr 0.48  Pulm: 4L02 Cards:  HTN, HLD  BP 183/87, HR wnl Hepatobil:  Baseline LFTs wnl Neuro: c/o pain not relieved by PCA ID:   Invanz   7/24>>      WBC 19.5,Afeb  Best Practices:  Enox, IIV ppi TPN Access: orders for PICC line TPN day#:0  Insulin Requirements in the past 24 hours:  none  Current Nutrition:  NPO, NS with 20mEq KCl/L @ 125  Assessment: 58 yo lady with perforated diverticulitis s/p exp lap with SBR to start TPN for prolonged ileus.  She has been NPO since 7/24.  Baseline albumin low at 2.1.  Nutritional Goals:  Will f/u RD rec  Plan:  Start Clinimix E 5/15 at 40 ml/hr Lipids 20% at 5 ml/hr Will add ssi when TPN started.  Will dc if tolerates TPN. Decrease IV fluids as TPN advanced F/u am labs and RD recommendations  Crosby Bevan Poteet 11/06/2013,7:38 AM

## 2013-11-06 NOTE — Progress Notes (Signed)
[facility] Progress Note  1 Day Post-Op  Subjective: Pt c/o a lot of pain over night.  Had a dose of IV tylenol which helped.  Using PCA every 15 min so she doesn't get locked out.  No N/V.  Foley in place, nursed taking it out.  Not ambulated OOB yet.  Objective: Vital signs in last 24 hours: Temp:  [97.6 F (36.4 C)-98.6 F (37 C)] 98.1 F (36.7 C) (07/29 0659) Pulse Rate:  [75-94] 85 (07/29 0659) Resp:  [13-26] 16 (07/29 0659) BP: (128-183)/(64-114) 183/87 mmHg (07/29 0659) SpO2:  [88 %-99 %] 98 % (07/29 0659) Last BM Date: 11/04/13  Intake/Output from previous day: 07/28 0701 - 07/29 0700 In: 3912 [I.V.:3612; IV Piggyback:300] Out: 2130 [Urine:1650; Emesis/NG output:100; Drains:180; Blood:200] Intake/Output this shift:    PE: Gen:  Alert, NAD, pleasant Abd: Soft, moderately tender over incision site, mild distension, diminished BS, no HSM, midline wound clean   Lab Results:   Recent Labs  11/05/13 0820 11/06/13 0437  WBC 14.2* 19.5*  HGB 11.5* 12.8  HCT 34.3* 38.3  PLT 390 459*   BMET  Recent Labs  11/04/13 0640 11/06/13 0437  NA 139 140  K 3.7 4.5  CL 100 101  CO2 23 17*  GLUCOSE 64* 105*  BUN 10 7  CREATININE 0.49* 0.48*  CALCIUM 8.6 8.6   PT/INR No results found for this basename: LABPROT, INR,  in the last 72 hours CMP     Component Value Date/Time   NA 140 11/06/2013 0437   K 4.5 11/06/2013 0437   CL 101 11/06/2013 0437   CO2 17* 11/06/2013 0437   GLUCOSE 105* 11/06/2013 0437   BUN 7 11/06/2013 0437   CREATININE 0.48* 11/06/2013 0437   CALCIUM 8.6 11/06/2013 0437   PROT 6.1 11/06/2013 0437   ALBUMIN 2.1* 11/06/2013 0437   AST 20 11/06/2013 0437   ALT 15 11/06/2013 0437   ALKPHOS 71 11/06/2013 0437   BILITOT 0.3 11/06/2013 0437   GFRNONAA >90 11/06/2013 0437   GFRAA >90 11/06/2013 0437   Lipase  No results found for this basename: lipase       Studies/Results: No results found.  Anti-infectives: Anti-infectives   Start      Dose/Rate Route Frequency Ordered Stop   11/01/13 0030  ertapenem (INVANZ) 1 g in sodium chloride 0.9 % 50 mL IVPB     1 g 100 mL/hr over 30 Minutes Intravenous Daily at bedtime 10/31/13 2356     10/31/13 2115  piperacillin-tazobactam (ZOSYN) IVPB 3.375 g     3.375 g 12.5 mL/hr over 240 Minutes Intravenous  Once 10/31/13 2114 11/01/13 0130       Assessment/Plan 1.  Perforated diverticulitis POD #1 s/p Ex Lap, appendectomy, SBR with primary anastamosis 2.  PCM on TPN 3.  Leukocytosis - 19.5 likely acute reaction from surgery 4.  Hypertensive - cont prn metoprolol   Plan:  1.  NPO, NG tube to LIWS, IVF, pain control, antiemetics, antibiotics (Invanz Day #6) 2.  D/c foley 3.  Ambulate and IS 4.  SCD's and lovenox 5.  Start TPN and insert PICC 6.  Continue JP drain -180mL 7.  Add robaxin, ice pack, and IV tylenol to help with post-op pain     LOS: 6 days    Aris GeorgiaDORT, Miaisabella Bacorn 11/06/2013, 7:22 AM Pager: [phone]

## 2013-11-06 NOTE — Progress Notes (Signed)
INITIAL NUTRITION ASSESSMENT  DOCUMENTATION CODES Per approved criteria  -Obesity Unspecified   INTERVENTION: TPN per pharmacy  NUTRITION DIAGNOSIS: Inadequate oral intake related to altered GI function as evidenced by NPO.   Goal: Pt will meet >90% of estimated nutritional needs  Monitor:  TPN tolerance, diet advancement, labs, weight changes, I/O's  Reason for Assessment: Consult for new TPN  58 y.o. female  Admitting Dx: <principal problem not specified>  ASSESSMENT: Pt admitted with acute diverticulitis with perforation. S/p ex lap, appendectomy, and SBR with primary anastomosis on 11/05/13.  Pt reports very good appetite PTA and denies weight loss. Wt hx reveals wt maintenance. She reports that she avoids citrus type foods due to GERD.  Nutrition focused physical exam reveals no sings of fat or muscle depletion.  Pt NPO with NGT for decompression. Also with JP drain with 180 ml output. She has been NPO x 5 days. Per pharmacy, pt with prolonged ileus. TPN will be initiated today. She is currently receiving Clinimix 5/15 @ 40 ml/hr (682 kcals and 48 grams protein) and 20% lipids @ 5 ml/hr (240 kcals). Current regimen will meet 61% of estimated nutritional needs and 48% of estimated protein needs. Labs reviewed. K, Mg, and Phos WNL.  Height: Ht Readings from Last 1 Encounters:  11/01/13 5\' 3"  (1.6 m)    Weight: Wt Readings from Last 1 Encounters:  11/01/13 185 lb (83.915 kg)    Ideal Body Weight: 115#  % Ideal Body Weight: 161%  Wt Readings from Last 10 Encounters:  11/01/13 185 lb (83.915 kg)  11/01/13 185 lb (83.915 kg)  02/14/13 187 lb (84.823 kg)    Usual Body Weight: 185#  % Usual Body Weight: 100%  BMI:  Body mass index is 32.78 kg/(m^2). Obesity, class I.   Estimated Nutritional Needs: Kcal: 1500-1700 Protein: 100-121 grams Fluid: 1.5-1.7 L  Skin: rt RLQ JP drain, closed abdominal incison  Diet Order: NPO  EDUCATION NEEDS: -Education not  appropriate at this time   Intake/Output Summary (Last 24 hours) at 11/06/13 1243 Last data filed at 11/06/13 1114  Gross per 24 hour  Intake   3912 ml  Output   2385 ml  Net   1527 ml    Last BM: 11/04/13  Labs:   Recent Labs Lab 11/03/13 0515 11/04/13 0640 11/06/13 0437  NA 138 139 140  K 3.6* 3.7 4.5  CL 101 100 101  CO2 26 23 17*  BUN 10 10 7   CREATININE 0.53 0.49* 0.48*  CALCIUM 8.4 8.6 8.6  MG  --   --  2.0  PHOS  --   --  4.3  GLUCOSE 108* 64* 105*    CBG (last 3)  No results found for this basename: GLUCAP,  in the last 72 hours  Scheduled Meds: . acetaminophen  1,000 mg Intravenous 4 times per day  . enoxaparin (LOVENOX) injection  40 mg Subcutaneous Q24H  . ertapenem  1 g Intravenous QHS  . HYDROmorphone PCA 0.3 mg/mL   Intravenous 6 times per day  . insulin aspart  0-9 Units Subcutaneous 6 times per day  . latanoprost  1 drop Both Eyes QHS  . methocarbamol (ROBAXIN) IV  1,000 mg Intravenous Q8H  . mupirocin ointment  1 application Nasal BID  . pantoprazole (PROTONIX) IV  40 mg Intravenous QHS    Continuous Infusions: . 0.9 % NaCl with KCl 20 mEq / L 125 mL/hr at 11/05/13 1002  . 0.9 % NaCl with KCl 20 mEq /  L    . .TPN (CLINIMIX-E) Adult     And  . fat emulsion      Past Medical History  Diagnosis Date  . Chest discomfort     , Atypical  . Systolic dysfunction     , Hyperdynamic  . Gastroesophageal reflux disease   . Hyperlipidemia   . Hypertension     Past Surgical History  Procedure Laterality Date  . Total abdominal hysterectomy    . Laparotomy N/A 11/05/2013    Procedure: EXPLORATORY LAPAROTOMY;  Surgeon: Cherylynn Ridges, MD;  Location: [facility] OR;  Service: General;  Laterality: N/A;  . Appendectomy N/A 11/05/2013    Procedure: APPENDECTOMY;  Surgeon: Cherylynn Ridges, MD;  Location: [facility] OR;  Service: General;  Laterality: N/A;  . Bowel resection N/A 11/05/2013    Procedure: SMALL BOWEL RESECTION;  Surgeon: Cherylynn Ridges, MD;  Location: [facility]  OR;  Service: General;  Laterality: N/A;    Madilyn Cephas A. Mayford Knife, RD, LDN Pager: [phone] After hours Pager: [phone]

## 2013-11-06 NOTE — Progress Notes (Signed)
Told patient we can keep Foley until tomorrow.  Pulse is improved, pain better controlled.   Carol LamasJames O. Gae BonWyatt, III, MD, FACS [phone]--pager [phone]--office [facility]

## 2013-11-06 NOTE — Consult Note (Addendum)
WOC follow-up: Pt did not receive an ostomy during yesterday's surgery.  CCS following for assessment and plan of care to abd wound. Please re-consult if further assistance is needed.   Thank-you, Cammie Mcgeeawn Nalina Yeatman MSN, RN, CWOCN, [place]CN-AP, CNS [phone]

## 2013-11-07 LAB — DIFFERENTIAL
BASOS ABS: 0 10*3/uL (ref 0.0–0.1)
BASOS PCT: 0 % (ref 0–1)
EOS ABS: 0.4 10*3/uL (ref 0.0–0.7)
Eosinophils Relative: 2 % (ref 0–5)
LYMPHS PCT: 14 % (ref 12–46)
Lymphs Abs: 2.6 10*3/uL (ref 0.7–4.0)
MONO ABS: 1.5 10*3/uL — AB (ref 0.1–1.0)
Monocytes Relative: 8 % (ref 3–12)
NEUTROS PCT: 76 % (ref 43–77)
Neutro Abs: 14.3 10*3/uL — ABNORMAL HIGH (ref 1.7–7.7)

## 2013-11-07 LAB — CBC
HCT: 31.7 % — ABNORMAL LOW (ref 36.0–46.0)
Hemoglobin: 10.7 g/dL — ABNORMAL LOW (ref 12.0–15.0)
MCH: 28.8 pg (ref 26.0–34.0)
MCHC: 33.8 g/dL (ref 30.0–36.0)
MCV: 85.2 fL (ref 78.0–100.0)
Platelets: 396 10*3/uL (ref 150–400)
RBC: 3.72 MIL/uL — AB (ref 3.87–5.11)
RDW: 14.8 % (ref 11.5–15.5)
WBC: 18.8 10*3/uL — AB (ref 4.0–10.5)

## 2013-11-07 LAB — COMPREHENSIVE METABOLIC PANEL
ALBUMIN: 1.8 g/dL — AB (ref 3.5–5.2)
ALT: 18 U/L (ref 0–35)
AST: 30 U/L (ref 0–37)
Alkaline Phosphatase: 59 U/L (ref 39–117)
Anion gap: 9 (ref 5–15)
BUN: 7 mg/dL (ref 6–23)
CHLORIDE: 101 meq/L (ref 96–112)
CO2: 27 meq/L (ref 19–32)
Calcium: 8 mg/dL — ABNORMAL LOW (ref 8.4–10.5)
Creatinine, Ser: 0.37 mg/dL — ABNORMAL LOW (ref 0.50–1.10)
GFR calc Af Amer: >90 mL/min (ref >90)
Glucose, Bld: 121 mg/dL — ABNORMAL HIGH (ref 70–99)
Potassium: 4 mEq/L (ref 3.7–5.3)
SODIUM: 137 meq/L (ref 137–147)
Total Bilirubin: 0.3 mg/dL (ref 0.3–1.2)
Total Protein: 5.5 g/dL — ABNORMAL LOW (ref 6.0–8.3)

## 2013-11-07 LAB — GLUCOSE, CAPILLARY
GLUCOSE-CAPILLARY: 113 mg/dL — AB (ref 70–99)
GLUCOSE-CAPILLARY: 126 mg/dL — AB (ref 70–99)
Glucose-Capillary: 136 mg/dL — ABNORMAL HIGH (ref 70–99)
Glucose-Capillary: 142 mg/dL — ABNORMAL HIGH (ref 70–99)
Glucose-Capillary: 112 mg/dL — ABNORMAL HIGH (ref 70–99)
Glucose-Capillary: 126 mg/dL — ABNORMAL HIGH (ref 70–99)

## 2013-11-07 LAB — MAGNESIUM: MAGNESIUM: 2 mg/dL (ref 1.5–2.5)

## 2013-11-07 LAB — PREALBUMIN: PREALBUMIN: 7.9 mg/dL — AB (ref 17.0–34.0)

## 2013-11-07 LAB — PHOSPHORUS: PHOSPHORUS: 2.2 mg/dL — AB (ref 2.3–4.6)

## 2013-11-07 LAB — TRIGLYCERIDES: TRIGLYCERIDES: 168 mg/dL — AB (ref <150)

## 2013-11-07 MED ORDER — POTASSIUM PHOSPHATES 15 MMOLE/5ML IV SOLN
25.0000 mmol | Freq: Once | INTRAVENOUS | Status: AC
  Administered 2013-11-07: 25 mmol via INTRAVENOUS
  Filled 2013-11-07: qty 8.33

## 2013-11-07 MED ORDER — ACETAMINOPHEN 10 MG/ML IV SOLN
1000.0000 mg | Freq: Four times a day (QID) | INTRAVENOUS | Status: AC
  Administered 2013-11-08 (×2): 1000 mg via INTRAVENOUS
  Filled 2013-11-07 (×3): qty 100

## 2013-11-07 MED ORDER — TRACE MINERALS CR-CU-F-FE-I-MN-MO-SE-ZN IV SOLN
INTRAVENOUS | Status: AC
  Administered 2013-11-07: 17:00:00 via INTRAVENOUS
  Filled 2013-11-07: qty 2000

## 2013-11-07 MED ORDER — FAT EMULSION 20 % IV EMUL
250.0000 mL | INTRAVENOUS | Status: AC
  Administered 2013-11-07: 250 mL via INTRAVENOUS
  Filled 2013-11-07: qty 250

## 2013-11-07 NOTE — Progress Notes (Signed)
Discoloration of wound noted.  ??Pseudomonas.  VAC okay, but will be careful if first changed does not show much improvement.  Overall pain is better.  Minimal output from drain.  Marta LamasJames O. Gae BonWyatt, III, MD, FACS [phone]--pager [phone]--office [facility]

## 2013-11-07 NOTE — Progress Notes (Signed)
[facility] Progress Note  2 Days Post-Op  Subjective: Pt's pain is much better controlled today with IV tylenol and robaxin.  She's requiring less dilaudid.  No N/V.  Feels stomach rumbling, but no gas of BM yet.  Ambulated OOB to the bathroom, but not in halls yet.  Sat in chair for 2.5 hr yesterday.  Pt is very optimistic.    Objective: Vital signs in last 24 hours: Temp:  [97.6 F (36.4 C)-98.8 F (37.1 C)] 98.3 F (36.8 C) (07/30 0545) Pulse Rate:  [77-90] 80 (07/30 0545) Resp:  [14-20] 20 (07/30 0545) BP: (138-182)/(68-98) 138/68 mmHg (07/30 0545) SpO2:  [93 %-98 %] 97 % (07/30 0545) Last BM Date: 11/04/13  Intake/Output from previous day: 07/29 0701 - 07/30 0700 In: 0  Out: 2480 [Urine:2300; Drains:180] Intake/Output this shift:    PE: Gen:  Alert, NAD, pleasant Card:  RRR, no M/G/R heard Pulm:  CTA, no W/R/R Abd: Soft, appropriately tender, mild distension, diminished BS, no HSM, large midline open wound as below, wound is 7.5cm deep, drain with sanguinous drainage (180mL)   11/07/13 - day of vac placement    Lab Results:   Recent Labs  11/06/13 0437 11/07/13 0520  WBC 19.5* 18.8*  HGB 12.8 10.7*  HCT 38.3 31.7*  PLT 459* 396   BMET  Recent Labs  11/06/13 0437 11/07/13 0520  NA 140 137  K 4.5 4.0  CL 101 101  CO2 17* 27  GLUCOSE 105* 121*  BUN 7 7  CREATININE 0.48* 0.37*  CALCIUM 8.6 8.0*   PT/INR No results found for this basename: LABPROT, INR,  in the last 72 hours CMP     Component Value Date/Time   NA 137 11/07/2013 0520   K 4.0 11/07/2013 0520   CL 101 11/07/2013 0520   CO2 27 11/07/2013 0520   GLUCOSE 121* 11/07/2013 0520   BUN 7 11/07/2013 0520   CREATININE 0.37* 11/07/2013 0520   CALCIUM 8.0* 11/07/2013 0520   PROT 5.5* 11/07/2013 0520   ALBUMIN 1.8* 11/07/2013 0520   AST 30 11/07/2013 0520   ALT 18 11/07/2013 0520   ALKPHOS 59 11/07/2013 0520   BILITOT 0.3 11/07/2013 0520   GFRNONAA >90 11/07/2013 0520   GFRAA >90  11/07/2013 0520   Lipase  No results found for this basename: lipase       Studies/Results: No results found.  Anti-infectives: Anti-infectives   Start     Dose/Rate Route Frequency Ordered Stop   11/01/13 0030  ertapenem (INVANZ) 1 g in sodium chloride 0.9 % 50 mL IVPB     1 g 100 mL/hr over 30 Minutes Intravenous Daily at bedtime 10/31/13 2356     10/31/13 2115  piperacillin-tazobactam (ZOSYN) IVPB 3.375 g     3.375 g 12.5 mL/hr over 240 Minutes Intravenous  Once 10/31/13 2114 11/06/13 2012       Assessment/Plan 1. Perforated bowel with interloop SB abscess, with SB diverticulae POD #2 s/p Ex Lap, appendectomy, SBR with primary anastamosis  2. PCM on TPN  3. Leukocytosis - down to 18.8 likely acute reaction from surgery  4. Hypertensive - cont prn metoprolol  5. ABL Anemia - cont to monitor  Plan:  1. NPO, NG tube to LIWS, IVF, pain control, antiemetics, antibiotics (Invanz Day #7) Would continue at least 5 more days (total of 7 days after surgery)  2. Urinating on own 3. Ambulate and IS, try rocking chair 4. SCD's and lovenox  5. Start TPN and  insert PICC  6. Continue JP drain -  7. Cont robaxin, ice pack, and IV tylenol to help with post-op pain 8. Hopefully can d/c PCA tomorrow and start IV push dilaudid 9. Wound vac placed today    LOS: 7 days    DORT, Carol [name] 11/07/2013, 7:31 AM Pager: [phone]

## 2013-11-07 NOTE — Progress Notes (Signed)
PARENTERAL NUTRITION CONSULT NOTE   Pharmacy Consult for TPN Indication: prolonged ileus  Allergies  Allergen Reactions  . Ivp Dye [Iodinated Diagnostic Agents] Hives and Shortness Of Breath  . Lisinopril Cough  . Sulfa Antibiotics Hives    Patient Measurements: Height: 5\' 3"  (160 cm) Weight: 185 lb (83.915 kg) IBW/kg (Calculated) : 52.4   Vital Signs: Temp: 98.3 F (36.8 C) (07/30 0545) Temp src: Oral (07/30 0545) BP: 138/68 mmHg (07/30 0545) Pulse Rate: 80 (07/30 0545) Intake/Output from previous day: 07/29 0701 - 07/30 0700 In: 0  Out: 2480 [Urine:2300; Drains:180] Intake/Output from this shift:    Labs:  Recent Labs  11/05/13 0820 11/06/13 0437 11/07/13 0520  WBC 14.2* 19.5* 18.8*  HGB 11.5* 12.8 10.7*  HCT 34.3* 38.3 31.7*  PLT 390 459* 396     Recent Labs  11/06/13 0437 11/07/13 0520 11/07/13 0525  NA 140 137  --   K 4.5 4.0  --   CL 101 101  --   CO2 17* 27  --   GLUCOSE 105* 121*  --   BUN 7 7  --   CREATININE 0.48* 0.37*  --   CALCIUM 8.6 8.0*  --   MG 2.0 2.0  --   PHOS 4.3 2.2*  --   PROT 6.1 5.5*  --   ALBUMIN 2.1* 1.8*  --   AST 20 30  --   ALT 15 18  --   ALKPHOS 71 59  --   BILITOT 0.3 0.3  --   TRIG  --   --  168*   Estimated Creatinine Clearance: 78.7 ml/min (by C-G formula based on Cr of 0.37).    Recent Labs  11/06/13 2032 11/06/13 2344 11/07/13 0443  GLUCAP 124* 142* 136*    Medical History: Past Medical History  Diagnosis Date  . Chest discomfort     , Atypical  . Systolic dysfunction     , Hyperdynamic  . Gastroesophageal reflux disease   . Hyperlipidemia   . Hypertension    Admit: 58 yo lady admitted with abdominal pain found to have perforated diverticulitis. She had a lap chole with SBR on 7/28.   GI: NPO  LBM 7/27   Clinimix E 5/15 started yesterday at 40 ml/hr Endo: no h/o DM  cbgs 124-142 Lytes: K 4, Na 137 , Mag 2.0, Phos 2.2 Corrected Ca 9.76 Renal: SrCr 0.37  UOP 1.1 ml/kg/hr Pulm:  2L02 Cards:  HTN, HLD  BP wnl, HR wnl  IV lopressor Hepatobil:  prealb 18.8, TG 168 Neuro: pain score 2-3 ID:  Invanz   7/24>>      WBC 18.8 ,Afeb  Best Practices:  Enox, IV ppi TPN Access: PICC line TPN day#:1  Insulin Requirements in the past 24 hours:  3 units ssi  Current Nutrition:  Clinimix E 5/15 at 40 ml/hr and lipids 20% at 5 ml/hr provides 682 Kcal and 48 gm protein , NS with 20mEq KCl/L @ 85  Assessment: 58 yo lady with perforated diverticulitis s/p exp lap with SBR to start TPN for NPO since 7/24.  Baseline albumin low at 2.1.  Her phosphorus is low this am.  Nutritional Goals:  1500-1700 Kcal 100-121 gm protein Clinimix E 5/15 at 83 ml/hr with lipids 20% at 10 ml/hr will provide 100 gm protein and 1894 Kcal/day  Plan:  KPhos bolus 25 mmol X 1 Increase Clinimix E 5/15 to 60 ml/hr Increase lipids 20% to 10 ml/hr Cont ssi  Will dc  if tolerates TPN @ goal rate Decrease IV fluids as TPN advanced F/u am labs  Thanks for allowing pharmacy to be a part of this patient's care.  Talbert Cage, PharmD Clinical Pharmacist, [phone] 11/07/2013,7:26 AM

## 2013-11-07 NOTE — Care Management Note (Signed)
  Page 1 of 1   11/07/2013     10:02:18 AM CARE MANAGEMENT NOTE 11/07/2013  Patient:  Rana SnareJOYNER,Maeve D   Account Number:  [id number]  Date Initiated:  11/05/2013  Documentation initiated by:  Ronny FlurryWILE,Alric Geise  Subjective/Objective Assessment:     Action/Plan:   Anticipated DC Date:     Anticipated DC Plan:  HOME W HOME HEALTH SERVICES         Choice offered to / List presented to:  C-1 Patient        HH arranged  HH-1 RN      [facility]  [facility]   Status of service:  In process, will continue to follow Medicare Important Message given?   (If response is "NO", the following Medicare IM given date fields will be blank) Date Medicare IM given:   Medicare IM given by:   Date Additional Medicare IM given:   Additional Medicare IM given by:    Discharge Disposition:    Per UR Regulation:    If discussed at Long Length of Stay Meetings, dates discussed:   11/05/2013  11/07/2013    Comments:  11-07-13 Confirmed face sheet inormation with patinet . Faxed signed VAC application to [employer]. Ronny FlurryHeather Jemmie Ledgerwood RN BSN [phone]

## 2013-11-07 NOTE — Consult Note (Addendum)
WOC wound consult note Reason for Consult: Requested to assess wound for application of Vac negative pressure dressing.  Aris GeorgiaMegan Dort, CCS PA at bedside to assess post-op wound Wound type: Full thickness to midline abd Measurement:  17X10X7.5cm Wound bed: Beefy red interspersed with yellow adipose Drainage (amount, consistency, odor) Mod green drainage on previous dressing when removed.  No odor. Periwound: Intact skin surrounding. Dressing procedure/placement/frequency: Applied one piece black foam to 125mm cont suction.  Pt using PCA and tolerated with minimal discomfort.  Bedside nurse to change dressing  on Sat. Please re-consult if further assistance is needed.  Thank-you,  Cammie Mcgeeawn Zhion Pevehouse MSN, RN, CWOCN, [place]CN-AP, CNS [phone]

## 2013-11-08 LAB — BASIC METABOLIC PANEL
Anion gap: 11 (ref 5–15)
BUN: 8 mg/dL (ref 6–23)
CHLORIDE: 99 meq/L (ref 96–112)
CO2: 30 meq/L (ref 19–32)
Calcium: 8.6 mg/dL (ref 8.4–10.5)
Creatinine, Ser: 0.41 mg/dL — ABNORMAL LOW (ref 0.50–1.10)
GFR calc Af Amer: >90 mL/min (ref >90)
GFR calc non Af Amer: >90 mL/min (ref >90)
GLUCOSE: 122 mg/dL — AB (ref 70–99)
Potassium: 3.8 mEq/L (ref 3.7–5.3)
Sodium: 140 mEq/L (ref 137–147)

## 2013-11-08 LAB — CBC WITH DIFFERENTIAL/PLATELET
BASOS PCT: 0 % (ref 0–1)
Basophils Absolute: 0 10*3/uL (ref 0.0–0.1)
EOS PCT: 3 % (ref 0–5)
Eosinophils Absolute: 0.5 10*3/uL (ref 0.0–0.7)
HEMATOCRIT: 32.1 % — AB (ref 36.0–46.0)
HEMOGLOBIN: 10.7 g/dL — AB (ref 12.0–15.0)
LYMPHS PCT: 13 % (ref 12–46)
Lymphs Abs: 2.2 10*3/uL (ref 0.7–4.0)
MCH: 28.3 pg (ref 26.0–34.0)
MCHC: 33.3 g/dL (ref 30.0–36.0)
MCV: 84.9 fL (ref 78.0–100.0)
MONOS PCT: 5 % (ref 3–12)
Monocytes Absolute: 0.9 10*3/uL (ref 0.1–1.0)
NEUTROS ABS: 13.7 10*3/uL — AB (ref 1.7–7.7)
Neutrophils Relative %: 79 % — ABNORMAL HIGH (ref 43–77)
Platelets: 391 10*3/uL (ref 150–400)
RBC: 3.78 MIL/uL — ABNORMAL LOW (ref 3.87–5.11)
RDW: 14.8 % (ref 11.5–15.5)
WBC: 17.3 10*3/uL — AB (ref 4.0–10.5)

## 2013-11-08 LAB — TYPE AND SCREEN
ABO/RH(D): O POS
Antibody Screen: NEGATIVE
UNIT DIVISION: 0
Unit division: 0

## 2013-11-08 LAB — GLUCOSE, CAPILLARY
GLUCOSE-CAPILLARY: 127 mg/dL — AB (ref 70–99)
GLUCOSE-CAPILLARY: 104 mg/dL — AB (ref 70–99)
GLUCOSE-CAPILLARY: 115 mg/dL — AB (ref 70–99)
Glucose-Capillary: 121 mg/dL — ABNORMAL HIGH (ref 70–99)
Glucose-Capillary: 135 mg/dL — ABNORMAL HIGH (ref 70–99)
Glucose-Capillary: 139 mg/dL — ABNORMAL HIGH (ref 70–99)

## 2013-11-08 MED ORDER — METOPROLOL TARTRATE 1 MG/ML IV SOLN
5.0000 mg | Freq: Four times a day (QID) | INTRAVENOUS | Status: DC | PRN
  Administered 2013-11-10 (×2): 5 mg via INTRAVENOUS
  Filled 2013-11-08: qty 5

## 2013-11-08 MED ORDER — POTASSIUM PHOSPHATES 15 MMOLE/5ML IV SOLN
20.0000 mmol | Freq: Once | INTRAVENOUS | Status: DC
  Filled 2013-11-08: qty 6.67

## 2013-11-08 MED ORDER — FAT EMULSION 20 % IV EMUL
250.0000 mL | INTRAVENOUS | Status: AC
  Administered 2013-11-08: 250 mL via INTRAVENOUS
  Filled 2013-11-08: qty 250

## 2013-11-08 MED ORDER — HYDROMORPHONE HCL PF 1 MG/ML IJ SOLN
0.5000 mg | INTRAMUSCULAR | Status: DC | PRN
  Administered 2013-11-08: 0.5 mg via INTRAVENOUS
  Administered 2013-11-08 – 2013-11-11 (×22): 1 mg via INTRAVENOUS
  Filled 2013-11-08 (×23): qty 1

## 2013-11-08 MED ORDER — TRACE MINERALS CR-CU-F-FE-I-MN-MO-SE-ZN IV SOLN
INTRAVENOUS | Status: AC
  Administered 2013-11-08: 17:00:00 via INTRAVENOUS
  Filled 2013-11-08: qty 2000

## 2013-11-08 MED ORDER — POTASSIUM CHLORIDE IN NACL 20-0.9 MEQ/L-% IV SOLN
INTRAVENOUS | Status: AC
  Administered 2013-11-08 – 2013-11-09 (×2): via INTRAVENOUS
  Filled 2013-11-08 (×2): qty 1000

## 2013-11-08 NOTE — Progress Notes (Signed)
CCS/[facility] Progress Note 3 Days Post-Op  Subjective: Patient doing better.  No BM or flatus, Moving about better.  Pathology from her operation demonstrated serositis and some fecal matter in the specimen.  This supports likely perforated colonic diverticulum versus primary SB problem, although the SB was abnormal and had an interloop abscess.  Drain in place should work fine.  No evidence of fecal drainage now.  Objective: Vital signs in last 24 hours: Temp:  [97.3 F (36.3 C)-99.7 F (37.6 C)] 98.1 F (36.7 C) (07/31 0547) Pulse Rate:  [80-92] 80 (07/31 0547) Resp:  [9-23] 17 (07/31 0621) BP: (126-163)/(66-81) 163/81 mmHg (07/31 0547) SpO2:  [93 %-97 %] 95 % (07/31 0621) Last BM Date: 11/04/13  Intake/Output from previous day: 07/30 0701 - 07/31 0700 In: 1020 [I.V.:1020] Out: 4500 [Urine:4065; Emesis/NG output:350; Drains:85] Intake/Output this shift:    General: No acute distress although she states that she feels hot.  Lungs: Clear  Abd: Minimally tender, good bowel sounds.  VAC in place but it does not cover the entire subcutaneous tissue.  Will watch.  Extremities: No clinical signs or symptoms of DVT  Neuro: Intact  Lab Results:  @LABLAST2 (wbc:2,hgb:2,hct:2,plt:2) BMET  Recent Labs  11/06/13 0437 11/07/13 0520  NA 140 137  K 4.5 4.0  CL 101 101  CO2 17* 27  GLUCOSE 105* 121*  BUN 7 7  CREATININE 0.48* 0.37*  CALCIUM 8.6 8.0*   PT/INR No results found for this basename: LABPROT, INR,  in the last 72 hours ABG No results found for this basename: PHART, PCO2, PO2, HCO3,  in the last 72 hours  Studies/Results: No results found.  Anti-infectives: Anti-infectives   Start     Dose/Rate Route Frequency Ordered Stop   11/01/13 0030  ertapenem (INVANZ) 1 g in sodium chloride 0.9 % 50 mL IVPB     1 g 100 mL/hr over 30 Minutes Intravenous Daily at bedtime 10/31/13 2356     10/31/13 2115  piperacillin-tazobactam (ZOSYN) IVPB 3.375 g     3.375 g 12.5 mL/hr  over 240 Minutes Intravenous  Once 10/31/13 2114 11/06/13 2012      Assessment/Plan: s/p Procedure(s): EXPLORATORY LAPAROTOMY APPENDECTOMY SMALL BOWEL RESECTION Clamp the NGT Allow ice chips Cut back on fluids.  LOS: 8 days   Marta LamasJames O. Gae BonWyatt, III, MD, FACS [phone]--pager [phone]--office [facility] 11/08/2013

## 2013-11-08 NOTE — Progress Notes (Addendum)
PARENTERAL NUTRITION CONSULT NOTE   Pharmacy Consult for TPN Indication: prolonged ileus  Allergies  Allergen Reactions  . Ivp Dye [Iodinated Diagnostic Agents] Hives and Shortness Of Breath  . Lisinopril Cough  . Sulfa Antibiotics Hives    Patient Measurements: Height: 5\' 3"  (160 cm) Weight: 185 lb (83.915 kg) IBW/kg (Calculated) : 52.4   Vital Signs: Temp: 98.1 F (36.7 C) (07/31 0547) Temp src: Oral (07/31 0547) BP: 163/81 mmHg (07/31 0547) Pulse Rate: 80 (07/31 0547) Intake/Output from previous day: 07/30 0701 - 07/31 0700 In: 0  Out: 4500 [Urine:4065; Emesis/NG output:350; Drains:85] Intake/Output from this shift:    Labs:  Recent Labs  11/05/13 0820 11/06/13 0437 11/07/13 0520  WBC 14.2* 19.5* 18.8*  HGB 11.5* 12.8 10.7*  HCT 34.3* 38.3 31.7*  PLT 390 459* 396     Recent Labs  11/06/13 0437 11/07/13 0520 11/07/13 0525  NA 140 137  --   K 4.5 4.0  --   CL 101 101  --   CO2 17* 27  --   GLUCOSE 105* 121*  --   BUN 7 7  --   CREATININE 0.48* 0.37*  --   CALCIUM 8.6 8.0*  --   MG 2.0 2.0  --   PHOS 4.3 2.2*  --   PROT 6.1 5.5*  --   ALBUMIN 2.1* 1.8*  --   AST 20 30  --   ALT 15 18  --   ALKPHOS 71 59  --   BILITOT 0.3 0.3  --   PREALBUMIN  --  7.9*  --   TRIG  --   --  168*   Estimated Creatinine Clearance: 78.7 ml/min (by C-G formula based on Cr of 0.37).    Recent Labs  11/07/13 2003 11/08/13 0016 11/08/13 0400  GLUCAP 126* 135* 121*   Insulin Requirements in the past 24 hours:  3 units Novolog SSI  Current Nutrition:  Clinimix E 5/15 at 60 ml/hr and lipids 20% at 5 ml/hr provides 682 Kcal and 48 gm protein   Nutritional Goals:  1500-1700 kcal and 100-121 gm protein Clinimix E 5/15 at 83 ml/hr with lipids 20% at 10 ml/hr on M/W/F will provide 100 gm protein and 1620 kcal  Admit: 58 yo lady admitted with abdominal pain found to have perforated diverticulitis. She had a lap chole with SBR on 7/28.   GI: Post-op ileus.  NPO/TPN.  NG o/p 350ml over past 24 hrs. JP drain o/p 85 ml over past 24 hours. Prealbumin 7.9 (low).  Endo: no h/o DM. CBGs well controlled  Lytes: No labs today.  Renal: SCr 0.37.  UOP 2 ml/kg/hr. MIVF NS with 20mEq K at 85 ml/hr. I/O not charted appropriately.  Pulm: 1.5L [place]  Cards:  HTN, HLD.  BP elevated, HR wnl.  IV lopressor  Hepatobil:  LFTs wnl. TG 168  Neuro: pain score 6-10. Dilaudid PCA.  ID:  Invanz   7/24>> (plan for 7 days post-op)      WBC 18.8 ,Afeb.  Best Practices:  Enox, IV ppi  TPN Access: PICC line  TPN day#:1  Plan:  1) Increase Clinimix E 5/15 to 83 ml/hr and change 20% lipids to 2810ml/hr on Mon/Wed/Fri 2) Continue SSI. Plan to d/c if tolerates TPN at goal rate 3) Decrease IV fluids to 40 ml/hr when TPN advanced tonight. 4) F/u BMET, Phos in a.m.  Thanks for allowing pharmacy to be a part of this patient's care.  Christoper Fabianaron Juston Goheen, PharmD, BCPS  Clinical pharmacist, pager [phone] 11/08/2013,7:11 AM

## 2013-11-09 LAB — GLUCOSE, CAPILLARY
GLUCOSE-CAPILLARY: 139 mg/dL — AB (ref 70–99)
Glucose-Capillary: 128 mg/dL — ABNORMAL HIGH (ref 70–99)
Glucose-Capillary: 144 mg/dL — ABNORMAL HIGH (ref 70–99)

## 2013-11-09 LAB — BASIC METABOLIC PANEL
Anion gap: 10 (ref 5–15)
BUN: 12 mg/dL (ref 6–23)
CHLORIDE: 99 meq/L (ref 96–112)
CO2: 25 mEq/L (ref 19–32)
Calcium: 8.6 mg/dL (ref 8.4–10.5)
Creatinine, Ser: 0.41 mg/dL — ABNORMAL LOW (ref 0.50–1.10)
GFR calc non Af Amer: >90 mL/min (ref >90)
Glucose, Bld: 133 mg/dL — ABNORMAL HIGH (ref 70–99)
POTASSIUM: 3.9 meq/L (ref 3.7–5.3)
Sodium: 134 mEq/L — ABNORMAL LOW (ref 137–147)

## 2013-11-09 LAB — CBC
HCT: 30.6 % — ABNORMAL LOW (ref 36.0–46.0)
Hemoglobin: 10.2 g/dL — ABNORMAL LOW (ref 12.0–15.0)
MCH: 28.3 pg (ref 26.0–34.0)
MCHC: 33.3 g/dL (ref 30.0–36.0)
MCV: 84.8 fL (ref 78.0–100.0)
Platelets: 376 10*3/uL (ref 150–400)
RBC: 3.61 MIL/uL — AB (ref 3.87–5.11)
RDW: 14.6 % (ref 11.5–15.5)
WBC: 17.7 10*3/uL — AB (ref 4.0–10.5)

## 2013-11-09 LAB — CULTURE, ROUTINE-ABSCESS

## 2013-11-09 LAB — PHOSPHORUS: Phosphorus: 3.5 mg/dL (ref 2.3–4.6)

## 2013-11-09 MED ORDER — TRACE MINERALS CR-CU-F-FE-I-MN-MO-SE-ZN IV SOLN
INTRAVENOUS | Status: AC
  Administered 2013-11-09: 19:00:00 via INTRAVENOUS
  Filled 2013-11-09: qty 2000

## 2013-11-09 NOTE — Progress Notes (Signed)
Patient ID: Carol [name], female   DOB: January 18, 1956, 58 y.o.   MRN: [id number] CCS progress note.   4 Days Post-Op  Subjective: Patient doing better. + Flatus and a BM, Moving about better.  No n/v.  Getting cleaned up.    Objective: Vital signs in last 24 hours: Temp:  [98.2 F (36.8 C)-99.2 F (37.3 C)] 98.4 F (36.9 C) (08/01 0506) Pulse Rate:  [80-96] 80 (08/01 0506) Resp:  [15-18] 15 (08/01 0506) BP: (145-172)/(72-81) 154/76 mmHg (08/01 0506) SpO2:  [93 %-99 %] 96 % (08/01 0506) Last BM Date: 11/04/13 (pre-op)  Intake/Output from previous day: 07/31 0701 - 08/01 0700 In: 1204.9 [I.V.:316; IV Piggyback:210; TPN:678.9] Out: 2540 [Urine:2350; Drains:190] Intake/Output this shift: Total I/O In: -  Out: 30 [Drains:30]  General: NAD  Lungs: Clear  Abd: Minimally tender, abd soft.  VAC in place   Extremities: No clinical signs or symptoms of DVT  Neuro: Intact  Lab Results:  CBC    Component Value Date/Time   WBC 17.7* 11/09/2013 0445   RBC 3.61* 11/09/2013 0445   HGB 10.2* 11/09/2013 0445   HCT 30.6* 11/09/2013 0445   PLT 376 11/09/2013 0445   MCV 84.8 11/09/2013 0445   MCH 28.3 11/09/2013 0445   MCHC 33.3 11/09/2013 0445   RDW 14.6 11/09/2013 0445   LYMPHSABS 2.2 11/08/2013 0840   MONOABS 0.9 11/08/2013 0840   EOSABS 0.5 11/08/2013 0840   BASOSABS 0.0 11/08/2013 0840     BMET  Recent Labs  11/08/13 0840 11/09/13 0445  NA 140 134*  K 3.8 3.9  CL 99 99  CO2 30 25  GLUCOSE 122* 133*  BUN 8 12  CREATININE 0.41* 0.41*  CALCIUM 8.6 8.6   PT/INR No results found for this basename: LABPROT, INR,  in the last 72 hours ABG No results found for this basename: PHART, PCO2, PO2, HCO3,  in the last 72 hours  Studies/Results: No results found.  Anti-infectives: Anti-infectives   Start     Dose/Rate Route Frequency Ordered Stop   11/01/13 0030  ertapenem (INVANZ) 1 g in sodium chloride 0.9 % 50 mL IVPB     1 g 100 mL/hr over 30 Minutes Intravenous Daily at bedtime  10/31/13 2356     10/31/13 2115  piperacillin-tazobactam (ZOSYN) IVPB 3.375 g     3.375 g 12.5 mL/hr over 240 Minutes Intravenous  Once 10/31/13 2114 11/06/13 2012      Assessment/Plan: s/p Procedure(s): EXPLORATORY LAPAROTOMY APPENDECTOMY SMALL BOWEL RESECTION D/c NGT Diverticulitis, peritonitis - continue antibiotics for fecal contamination/intraabdominal abscess.  Still has leukocytosis, although afebrile.   Advance diet. Continue mobility. DVT prophylaxis. Metoprolol for HTN.    LOS: 9 days    [phone]--office [facility] 11/09/2013

## 2013-11-09 NOTE — Progress Notes (Signed)
PARENTERAL NUTRITION CONSULT NOTE   Pharmacy Consult for TPN Indication: prolonged ileus  Allergies  Allergen Reactions  . Ivp Dye [Iodinated Diagnostic Agents] Hives and Shortness Of Breath  . Lisinopril Cough  . Sulfa Antibiotics Hives    Patient Measurements: Height: 5\' 3"  (160 cm) Weight: 185 lb (83.915 kg) IBW/kg (Calculated) : 52.4 Adjusted body weight:62 kg Usual body weight: 84 kg   Vital Signs: Temp: 98.4 F (36.9 C) (08/01 0506) Temp src: Oral (08/01 0506) BP: 154/76 mmHg (08/01 0506) Pulse Rate: 80 (08/01 0506) Intake/Output from previous day: 07/31 0701 - 08/01 0700 In: 1204.9 [I.V.:316; IV Piggyback:210; TPN:678.9] Out: 2540 [Urine:2350; Drains:190] Intake/Output from this shift:    Labs:  Recent Labs  11/07/13 0520 11/08/13 0840 11/09/13 0445  WBC 18.8* 17.3* 17.7*  HGB 10.7* 10.7* 10.2*  HCT 31.7* 32.1* 30.6*  PLT 396 391 376     Recent Labs  11/07/13 0520 11/07/13 0525 11/08/13 0840 11/09/13 0445  NA 137  --  140 134*  K 4.0  --  3.8 3.9  CL 101  --  99 99  CO2 27  --  30 25  GLUCOSE 121*  --  122* 133*  BUN 7  --  8 12  CREATININE 0.37*  --  0.41* 0.41*  CALCIUM 8.0*  --  8.6 8.6  MG 2.0  --   --   --   PHOS 2.2*  --   --  3.5  PROT 5.5*  --   --   --   ALBUMIN 1.8*  --   --   --   AST 30  --   --   --   ALT 18  --   --   --   ALKPHOS 59  --   --   --   BILITOT 0.3  --   --   --   PREALBUMIN 7.9*  --   --   --   TRIG  --  168*  --   --    Estimated Creatinine Clearance: 78.7 ml/min (by C-G formula based on Cr of 0.41).    Recent Labs  11/08/13 2019 11/09/13 0003 11/09/13 0411  GLUCAP 115* 139* 128*   Insulin Requirements in the past 24 hours:  3 units Novolog SSI  Current Nutrition:  Clinimix E 5/15 at 83 ml/hr and lipids 20% at 10 ml/hr on Mon/Wed/Fri provides 1620 Kcal and 100 gm protein   Nutritional Goals:  1500-1700 kcal and 100-121 gm protein  Admit: 58 yo lady admitted with abdominal pain found to have  perforated diverticulitis. She had a lap chole with SBR on 7/28.   GI: Post-op ileus. NPO/TPN.  NG clamped 7/31 - allowing ice chips. JP drain o/p 190 ml over past 24 hours. Prealbumin 7.9 (low).  Endo: no h/o DM. CBGs well controlled. Requiring minimal SSI at goal of TPN.  Lytes: Na low, other lytes ok.  Renal: SCr stable.  UOP 1.2 ml/kg/hr. MIVF NS with K at 40 ml/hr. I/O not charted appropriately.  Pulm: RA  Cards:  HTN, HLD.  BP elevated, HR wnl.  IV lopressor  Hepatobil:  LFTs wnl. TG 168  Neuro: pain score 1-7. Dilaudid PCA.  ID:  Invanz   7/24>> (plan for 7 days post-op)      WBC 17.7, Afeb.  Best Practices:  Enox, IV ppi  TPN Access: PICC line  TPN day#: 2  Plan:  1) Continue Clinimix E 5/15 at 83 ml/hr and 20%  lipids at 5910ml/hr on Mon/Wed/Fri 2) D/c CBGs and SSI 3) TPN labs 4) Will f/u ability to initiate diet  Thanks for allowing pharmacy to be a part of this patient's care.  Christoper Fabianaron Mihira Tozzi, PharmD, BCPS Clinical pharmacist, pager [phone] 11/09/2013,7:13 AM

## 2013-11-10 LAB — ANAEROBIC CULTURE

## 2013-11-10 MED ORDER — TRACE MINERALS CR-CU-F-FE-I-MN-MO-SE-ZN IV SOLN
INTRAVENOUS | Status: DC
  Filled 2013-11-10: qty 2000

## 2013-11-10 MED ORDER — TRACE MINERALS CR-CU-F-FE-I-MN-MO-SE-ZN IV SOLN
INTRAVENOUS | Status: AC
  Administered 2013-11-10: 18:00:00 via INTRAVENOUS
  Filled 2013-11-10: qty 1000

## 2013-11-10 NOTE — Progress Notes (Addendum)
PARENTERAL NUTRITION CONSULT NOTE   Pharmacy Consult for TPN Indication: prolonged ileus  Allergies  Allergen Reactions  . Ivp Dye [Iodinated Diagnostic Agents] Hives and Shortness Of Breath  . Lisinopril Cough  . Sulfa Antibiotics Hives    Patient Measurements: Height: 5\' 3"  (160 cm) Weight: 185 lb (83.915 kg) IBW/kg (Calculated) : 52.4 Adjusted body weight:62 kg Usual body weight: 84 kg   Vital Signs: Temp: 98 F (36.7 C) (08/02 0522) Temp src: Oral (08/01 2202) BP: 152/70 mmHg (08/02 0522) Pulse Rate: 71 (08/02 0522) Intake/Output from previous day: 08/01 0701 - 08/02 0700 In: 1026 [TPN:996] Out: 410 [Urine:200; Drains:210] Intake/Output from this shift:    Labs:  Recent Labs  11/08/13 0840 11/09/13 0445  WBC 17.3* 17.7*  HGB 10.7* 10.2*  HCT 32.1* 30.6*  PLT 391 376     Recent Labs  11/08/13 0840 11/09/13 0445  NA 140 134*  K 3.8 3.9  CL 99 99  CO2 30 25  GLUCOSE 122* 133*  BUN 8 12  CREATININE 0.41* 0.41*  CALCIUM 8.6 8.6  PHOS  --  3.5   Estimated Creatinine Clearance: 78.7 ml/min (by C-G formula based on Cr of 0.41).    Recent Labs  11/09/13 0003 11/09/13 0411 11/09/13 0746  GLUCAP 139* 128* 144*   Insulin Requirements in the past 24 hours:  CBGs/SSI discontinued  Current Nutrition:  Clinimix E 5/15 at 83 ml/hr and lipids 20% at 10 ml/hr on Mon/Wed/Fri provides 1620 Kcal and 100 gm protein   Nutritional Goals:  1500-1700 kcal and 100-121 gm protein  Admit: 58 yo lady admitted with abdominal pain found to have perforated diverticulitis. She had a lap chole with SBR on 7/28.   GI: Post-op ileus. NPO/TPN.  Tolerated NGT clamping -d/c 8/2. Pt advanced to full liquids today. BM yesterday. Prealbumin 7.9 (low).  Endo: no h/o DM. CBGs/SSI discontinued.  Lytes: No lytes today.  Renal: SCr stable. MIVF NS with 20mEq K at 40 ml/hr. I/O not charted appropriately.  Pulm: RA  Cards:  HTN, HLD.  BP elevated, HR wnl.  IV  lopressor  Hepatobil:  LFTs wnl. TG 168  Neuro: pain score 5-6. Dilaudid PCA.  ID:  Invanz   7/24>> (plan for 7 days post-op)      WBC 17.7, Afeb.  Best Practices:  Enox, IV ppi  TPN Access: PICC line  TPN day#: 3  Plan:  1) Continue Clinimix E 5/15 at 83 ml/hr and 20% lipids at 910ml/hr on Mon/Wed/Fri 2) TPN labs 3) Will f/u ability to advance diet further. Likely can d/c TPN tomorrow  Thanks for allowing pharmacy to be a part of this patient's care.  Christoper Fabianaron Avalon Coppinger, PharmD, BCPS Clinical pharmacist, pager [phone] 11/10/2013,7:37 AM  Addendum: Dr. Donell BeersByerly called and is advancing pt to regular diet. Would like to wean TPN to 1/2 rate today and then d/c tomorrow.  Plan: 1) Decrease Clinimix E 5/15 to 40 ml/hr.  Christoper Fabianaron Dakwan Pridgen, PharmD, BCPS Clinical pharmacist, pager [phone] 11/10/2013  10:03 AM

## 2013-11-10 NOTE — Progress Notes (Signed)
Patient ID: Carol [name], female   DOB: 06/07/1955, 58 y.o.   MRN: [id number] [facility] progress note.   5 Days Post-Op  Subjective: Pt continues to feel better.  Tolerated full liquids yesterday.    Objective: Vital signs in last 24 hours: Temp:  [98 F (36.7 C)-99.5 F (37.5 C)] 98 F (36.7 C) (08/02 0522) Pulse Rate:  [60-82] 71 (08/02 0522) Resp:  [19-20] 19 (08/02 0522) BP: (151-162)/(63-70) 152/70 mmHg (08/02 0522) SpO2:  [97 %-98 %] 98 % (08/02 0522) Last BM Date: 11/10/13  Intake/Output from previous day: 08/01 0701 - 08/02 0700 In: 1026 [TPN:996] Out: 410 [Urine:200; Drains:210] Intake/Output this shift:    General: NAD  Lungs: breathing comfortably  Abd: Minimally tender, abd soft.  VAC in place   Extremities: No clinical signs or symptoms of DVT  Neuro: Intact  Lab Results:  CBC    Component Value Date/Time   WBC 17.7* 11/09/2013 0445   RBC 3.61* 11/09/2013 0445   HGB 10.2* 11/09/2013 0445   HCT 30.6* 11/09/2013 0445   PLT 376 11/09/2013 0445   MCV 84.8 11/09/2013 0445   MCH 28.3 11/09/2013 0445   MCHC 33.3 11/09/2013 0445   RDW 14.6 11/09/2013 0445   LYMPHSABS 2.2 11/08/2013 0840   MONOABS 0.9 11/08/2013 0840   EOSABS 0.5 11/08/2013 0840   BASOSABS 0.0 11/08/2013 0840     BMET  Recent Labs  11/08/13 0840 11/09/13 0445  NA 140 134*  K 3.8 3.9  CL 99 99  CO2 30 25  GLUCOSE 122* 133*  BUN 8 12  CREATININE 0.41* 0.41*  CALCIUM 8.6 8.6   PT/INR No results found for this basename: LABPROT, INR,  in the last 72 hours ABG No results found for this basename: PHART, PCO2, PO2, HCO3,  in the last 72 hours  Studies/Results: No results found.  Anti-infectives: Anti-infectives   Start     Dose/Rate Route Frequency Ordered Stop   11/01/13 0030  ertapenem (INVANZ) 1 g in sodium chloride 0.9 % 50 mL IVPB     1 g 100 mL/hr over 30 Minutes Intravenous Daily at bedtime 10/31/13 2356     10/31/13 2115  piperacillin-tazobactam (ZOSYN) IVPB 3.375 g     3.375 g 12.5  mL/hr over 240 Minutes Intravenous  Once 10/31/13 2114 11/06/13 2012      Assessment/Plan: s/p Procedure(s): EXPLORATORY LAPAROTOMY APPENDECTOMY SMALL BOWEL RESECTION Diverticulitis, peritonitis - continue antibiotics for fecal contamination/intraabdominal abscess.  Still has leukocytosis, although afebrile.  Recheck CBC tomorrow Advance diet. Continue mobility. DVT prophylaxis. Metoprolol for HTN.   Wean TNA today, hopefully off tomorrow.  For prolonged ileus and severe protein calorie malnutrition.    LOS: 10 days    [phone]--office [facility] 11/10/2013

## 2013-11-11 LAB — COMPREHENSIVE METABOLIC PANEL
ALK PHOS: 154 U/L — AB (ref 39–117)
ALT: 36 U/L — AB (ref 0–35)
AST: 29 U/L (ref 0–37)
Albumin: 2.2 g/dL — ABNORMAL LOW (ref 3.5–5.2)
Anion gap: 14 (ref 5–15)
BILIRUBIN TOTAL: 0.4 mg/dL (ref 0.3–1.2)
BUN: 12 mg/dL (ref 6–23)
CHLORIDE: 99 meq/L (ref 96–112)
CO2: 24 meq/L (ref 19–32)
Calcium: 9.1 mg/dL (ref 8.4–10.5)
Creatinine, Ser: 0.42 mg/dL — ABNORMAL LOW (ref 0.50–1.10)
GLUCOSE: 115 mg/dL — AB (ref 70–99)
Potassium: 3.9 mEq/L (ref 3.7–5.3)
SODIUM: 137 meq/L (ref 137–147)
Total Protein: 6.9 g/dL (ref 6.0–8.3)

## 2013-11-11 LAB — CBC
HCT: 32.8 % — ABNORMAL LOW (ref 36.0–46.0)
HEMOGLOBIN: 10.8 g/dL — AB (ref 12.0–15.0)
MCH: 28.6 pg (ref 26.0–34.0)
MCHC: 32.9 g/dL (ref 30.0–36.0)
MCV: 87 fL (ref 78.0–100.0)
Platelets: 508 10*3/uL — ABNORMAL HIGH (ref 150–400)
RBC: 3.77 MIL/uL — ABNORMAL LOW (ref 3.87–5.11)
RDW: 14.5 % (ref 11.5–15.5)
WBC: 15 10*3/uL — ABNORMAL HIGH (ref 4.0–10.5)

## 2013-11-11 LAB — DIFFERENTIAL
BASOS ABS: 0 10*3/uL (ref 0.0–0.1)
BASOS PCT: 0 % (ref 0–1)
EOS PCT: 3 % (ref 0–5)
Eosinophils Absolute: 0.5 10*3/uL (ref 0.0–0.7)
Lymphocytes Relative: 14 % (ref 12–46)
Lymphs Abs: 2.1 10*3/uL (ref 0.7–4.0)
MONO ABS: 1 10*3/uL (ref 0.1–1.0)
MONOS PCT: 7 % (ref 3–12)
NEUTROS ABS: 11.5 10*3/uL — AB (ref 1.7–7.7)
Neutrophils Relative %: 76 % (ref 43–77)

## 2013-11-11 LAB — URINALYSIS, ROUTINE W REFLEX MICROSCOPIC
Bilirubin Urine: NEGATIVE
GLUCOSE, UA: NEGATIVE mg/dL
Hgb urine dipstick: NEGATIVE
Ketones, ur: NEGATIVE mg/dL
LEUKOCYTES UA: NEGATIVE
Nitrite: NEGATIVE
PROTEIN: NEGATIVE mg/dL
Specific Gravity, Urine: 1.022 (ref 1.005–1.030)
UROBILINOGEN UA: 1 mg/dL (ref 0.0–1.0)
pH: 5 (ref 5.0–8.0)

## 2013-11-11 LAB — PREALBUMIN: Prealbumin: 16.5 mg/dL — ABNORMAL LOW (ref 17.0–34.0)

## 2013-11-11 LAB — MAGNESIUM: Magnesium: 2.2 mg/dL (ref 1.5–2.5)

## 2013-11-11 LAB — PHOSPHORUS: Phosphorus: 4 mg/dL (ref 2.3–4.6)

## 2013-11-11 LAB — TRIGLYCERIDES: Triglycerides: 262 mg/dL — ABNORMAL HIGH (ref <150)

## 2013-11-11 MED ORDER — METHOCARBAMOL 500 MG PO TABS
1000.0000 mg | ORAL_TABLET | Freq: Three times a day (TID) | ORAL | Status: DC
  Administered 2013-11-11 – 2013-11-13 (×8): 1000 mg via ORAL
  Filled 2013-11-11 (×12): qty 2

## 2013-11-11 MED ORDER — OXYCODONE-ACETAMINOPHEN 5-325 MG PO TABS
1.0000 | ORAL_TABLET | ORAL | Status: DC | PRN
  Administered 2013-11-11: 2 via ORAL
  Administered 2013-11-11: 1 via ORAL
  Administered 2013-11-11 – 2013-11-13 (×12): 2 via ORAL
  Filled 2013-11-11 (×14): qty 2

## 2013-11-11 NOTE — Progress Notes (Signed)
PARENTERAL NUTRITION CONSULT NOTE   Pharmacy Consult for TPN Indication: prolonged ileus  Allergies  Allergen Reactions  . Ivp Dye [Iodinated Diagnostic Agents] Hives and Shortness Of Breath  . Lisinopril Cough  . Sulfa Antibiotics Hives    Patient Measurements: Height: 5\' 3"  (160 cm) Weight: 185 lb (83.915 kg) IBW/kg (Calculated) : 52.4 Adjusted body weight:62 kg Usual body weight: 84 kg   Vital Signs: Temp: 98.5 F (36.9 C) (08/03 0555) Temp src: Oral (08/03 0555) BP: 151/70 mmHg (08/03 0555) Pulse Rate: 73 (08/03 0555) Intake/Output from previous day: 08/02 0701 - 08/03 0700 In: 1524 [P.O.:240; I.V.:320; IV Piggyback:300; TPN:664] Out: 1030 [Urine:1000; Drains:30] Intake/Output from this shift:    Labs:  Recent Labs  11/08/13 0840 11/09/13 0445 11/11/13 0509  WBC 17.3* 17.7* 15.0*  HGB 10.7* 10.2* 10.8*  HCT 32.1* 30.6* 32.8*  PLT 391 376 508*     Recent Labs  11/08/13 0840 11/09/13 0445 11/11/13 0509  NA 140 134* 137  K 3.8 3.9 3.9  CL 99 99 99  CO2 30 25 24   GLUCOSE 122* 133* 115*  BUN 8 12 12   CREATININE 0.41* 0.41* 0.42*  CALCIUM 8.6 8.6 9.1  MG  --   --  2.2  PHOS  --  3.5 4.0  PROT  --   --  6.9  ALBUMIN  --   --  2.2*  AST  --   --  29  ALT  --   --  36*  ALKPHOS  --   --  154*  BILITOT  --   --  0.4  TRIG  --   --  262*   Estimated Creatinine Clearance: 78.7 ml/min (by C-G formula based on Cr of 0.42).    Recent Labs  11/09/13 0003 11/09/13 0411 11/09/13 0746  GLUCAP 139* 128* 144*   Insulin Requirements in the past 24 hours:  CBGs/SSI discontinued  Current Nutrition:  Clinimix E 5/15 at 40 ml/hr provides 682 kcal and 48 gm protein   Nutritional Goals:  1500-1700 kcal and 100-121 gm protein  Admit: 58 yo lady admitted with abdominal pain found to have perforated diverticulitis. She had a lap chole with SBR on 7/28.   GI: Post-op ileus. NPO/TPN. Tolerated NGT clamping -d/c 8/2. Pt advanced to full liquids yesterday  and TPN weaned to half. BM yesterday. Prealbumin 7.9 (low).  Endo: no h/o DM. CBGs/SSI discontinued.  Lytes: all lytes WNL this morning  Renal: SCr stable.  I/O does not appear to be charted appropriately.  Pulm: RA  Cards:  HTN, HLD.  BP elevated, HR wnl.  IV lopressor  Hepatobil:  LFTs wnl. TG increased to 262  Neuro: pain score 5-6. Dilaudid PCA.  ID: WBC 15, Afeb. Invanz 7/24>> (plan for 7 days post-op). EColi in abscess- pan sensitive except to ampicillin and Unasyn  Best Practices:  Enox, IV ppi  TPN Access: PICC line  TPN day#: 4  Plan:  1. Continue Clinimix E 5/15 at 40 ml/hr until 1659, then turn down to 4420mL/hr x1 hour, and then off compeltely 2. TPN labs discontinued 3. Pharmacy to sign off. Please re-consult for any other issues.  Jettie Lazare D. Diamonds Lippard, PharmD, BCPS Clinical Pharmacist Pager: [phone] 11/11/2013 8:42 AM

## 2013-11-11 NOTE — Progress Notes (Signed)
Patient ID: Carol [name], female   DOB: 1956/03/26, 58 y.o.   MRN: [id number] 6 Days Post-Op  Subjective: Patient is feeling okay this morning. Her biggest complaint is she has some pain at night. Otherwise her pain is fairly well controlled. She is tolerating a regular diet. She is passing flatus and has had a few small bowel movements.  Objective: Vital signs in last 24 hours: Temp:  [98.5 F (36.9 C)-99.1 F (37.3 C)] 98.5 F (36.9 C) (08/03 0555) Pulse Rate:  [73-79] 73 (08/03 0555) Resp:  [17-18] 18 (08/03 0555) BP: (129-151)/(62-71) 151/70 mmHg (08/03 0555) SpO2:  [96 %-98 %] 97 % (08/03 0555) Last BM Date: 11/10/13  Intake/Output from previous day: 08/02 0701 - 08/03 0700 In: 1524 [P.O.:240; I.V.:320; IV Piggyback:300; TPN:664] Out: 1030 [Urine:1000; Drains:30] Intake/Output this shift:    PE: Abd: Soft, appropriately tender, with wound VAC in place. She does have 2 skin blisters from tape. Her JP drain is serous. She has active bowel sounds.  Lab Results:   Recent Labs  11/09/13 0445 11/11/13 0509  WBC 17.7* 15.0*  HGB 10.2* 10.8*  HCT 30.6* 32.8*  PLT 376 508*   BMET  Recent Labs  11/09/13 0445 11/11/13 0509  NA 134* 137  K 3.9 3.9  CL 99 99  CO2 25 24  GLUCOSE 133* 115*  BUN 12 12  CREATININE 0.41* 0.42*  CALCIUM 8.6 9.1   PT/INR No results found for this basename: LABPROT, INR,  in the last 72 hours CMP     Component Value Date/Time   NA 137 11/11/2013 0509   K 3.9 11/11/2013 0509   CL 99 11/11/2013 0509   CO2 24 11/11/2013 0509   GLUCOSE 115* 11/11/2013 0509   BUN 12 11/11/2013 0509   CREATININE 0.42* 11/11/2013 0509   CALCIUM 9.1 11/11/2013 0509   PROT 6.9 11/11/2013 0509   ALBUMIN 2.2* 11/11/2013 0509   AST 29 11/11/2013 0509   ALT 36* 11/11/2013 0509   ALKPHOS 154* 11/11/2013 0509   BILITOT 0.4 11/11/2013 0509   GFRNONAA >90 11/11/2013 0509   GFRAA >90 11/11/2013 0509   Lipase  No results found for this basename: lipase       Studies/Results: No  results found.  Anti-infectives: Anti-infectives   Start     Dose/Rate Route Frequency Ordered Stop   11/01/13 0030  ertapenem (INVANZ) 1 g in sodium chloride 0.9 % 50 mL IVPB     1 g 100 mL/hr over 30 Minutes Intravenous Daily at bedtime 10/31/13 2356     10/31/13 2115  piperacillin-tazobactam (ZOSYN) IVPB 3.375 g     3.375 g 12.5 mL/hr over 240 Minutes Intravenous  Once 10/31/13 2114 11/06/13 2012       Assessment/Plan  1. POD 6, status post exploratory laparotomy with small bowel resection and drainage of interloop abscess 2. PCM/TNA  3. Leukocytosis  Plan: 1. Her TNA will be weaned to off today. She will continue a regular diet. We'll repeat her CBC in the morning and follow this closely. Continue her IV antibiotic therapy at this time. If her white blood cell count normalizes, she may be stable for discharge home as soon as tomorrow. 2. Back change is scheduled for tomorrow. I will have to discuss with the doctor to see if this is something want to send her home with her not.  LOS: 11 days    Sarah Baez E 11/11/2013, 9:09 AM Pager: [phone]

## 2013-11-11 NOTE — Progress Notes (Signed)
Pt seen this examined after 7pm Doing well Appetite not back to normal yet No BM today Some burping this pm  Pain ok  Alert, nad Soft, mild expected TTP Drain - serous  No fever. Wbc slowly trending down but hanging around 15 If spikes temp or if wbc goes up - will need ct scan ua normal Hopefully home soon  Mary Sellaric M. Andrey CampanileWilson, MD, FACS General, Bariatric, & Minimally Invasive Surgery [facility], [place]

## 2013-11-12 ENCOUNTER — Inpatient Hospital Stay (HOSPITAL_COMMUNITY): Payer: 59

## 2013-11-12 LAB — CBC
HEMATOCRIT: 31.3 % — AB (ref 36.0–46.0)
Hemoglobin: 10 g/dL — ABNORMAL LOW (ref 12.0–15.0)
MCH: 27.4 pg (ref 26.0–34.0)
MCHC: 31.9 g/dL (ref 30.0–36.0)
MCV: 85.8 fL (ref 78.0–100.0)
PLATELETS: 572 10*3/uL — AB (ref 150–400)
RBC: 3.65 MIL/uL — AB (ref 3.87–5.11)
RDW: 14.3 % (ref 11.5–15.5)
WBC: 14.2 10*3/uL — ABNORMAL HIGH (ref 4.0–10.5)

## 2013-11-12 MED ORDER — PANTOPRAZOLE SODIUM 40 MG PO TBEC
40.0000 mg | DELAYED_RELEASE_TABLET | Freq: Every day | ORAL | Status: DC
Start: 2013-11-12 — End: 2013-11-13
  Administered 2013-11-12: 40 mg via ORAL
  Filled 2013-11-12: qty 1

## 2013-11-12 MED ORDER — ONDANSETRON HCL 4 MG/2ML IJ SOLN
4.0000 mg | Freq: Four times a day (QID) | INTRAMUSCULAR | Status: DC | PRN
  Administered 2013-11-12: 4 mg via INTRAVENOUS
  Filled 2013-11-12: qty 2

## 2013-11-12 MED ORDER — ONDANSETRON HCL 4 MG/2ML IJ SOLN: 4.0000 mg | Freq: Four times a day (QID) | INTRAMUSCULAR | Status: DC

## 2013-11-12 NOTE — Progress Notes (Signed)
Wound vac changed and pt tolerated well.  Will continue to monitor. Vanice Sarahhompson, Zachariah Pavek L

## 2013-11-12 NOTE — Progress Notes (Signed)
Patient ID: Carol [name], female   DOB: 11-Nov-1955, 58 y.o.   MRN: [id number] 7 Days Post-Op  Subjective: Patient feels okay today. She is very anxious about y blood cell count is only dropped from 15-->14. She is eating well. Her pain is well-controlled with Percocet.  Objective: Vital signs in last 24 hours: Temp:  [98.2 F (36.8 C)-98.3 F (36.8 C)] 98.2 F (36.8 C) (08/04 0544) Pulse Rate:  [70-83] 70 (08/04 0544) Resp:  [16-19] 16 (08/04 0544) BP: (123-138)/(60-74) 133/67 mmHg (08/04 0544) SpO2:  [96 %-98 %] 98 % (08/04 0544) Last BM Date: 11/10/13  Intake/Output from previous day: 08/03 0701 - 08/04 0700 In: 340 [P.O.:240; IV Piggyback:100] Out: 60 [Drains:60] Intake/Output this shift:    PE: Abd: Soft, appropriately tender, nondistended, active bowel sounds, wound VAC is in place. JP drain with minimal serous output.  Lab Results:   Recent Labs  11/11/13 0509 11/12/13 0535  WBC 15.0* 14.2*  HGB 10.8* 10.0*  HCT 32.8* 31.3*  PLT 508* 572*   BMET  Recent Labs  11/11/13 0509  NA 137  K 3.9  CL 99  CO2 24  GLUCOSE 115*  BUN 12  CREATININE 0.42*  CALCIUM 9.1   PT/INR No results found for this basename: LABPROT, INR,  in the last 72 hours CMP     Component Value Date/Time   NA 137 11/11/2013 0509   K 3.9 11/11/2013 0509   CL 99 11/11/2013 0509   CO2 24 11/11/2013 0509   GLUCOSE 115* 11/11/2013 0509   BUN 12 11/11/2013 0509   CREATININE 0.42* 11/11/2013 0509   CALCIUM 9.1 11/11/2013 0509   PROT 6.9 11/11/2013 0509   ALBUMIN 2.2* 11/11/2013 0509   AST 29 11/11/2013 0509   ALT 36* 11/11/2013 0509   ALKPHOS 154* 11/11/2013 0509   BILITOT 0.4 11/11/2013 0509   GFRNONAA >90 11/11/2013 0509   GFRAA >90 11/11/2013 0509   Lipase  No results found for this basename: lipase       Studies/Results: No results found.  Anti-infectives: Anti-infectives   Start     Dose/Rate Route Frequency Ordered Stop   11/01/13 0030  ertapenem (INVANZ) 1 g in sodium chloride 0.9 % 50 mL IVPB   Status:  Discontinued     1 g 100 mL/hr over 30 Minutes Intravenous Daily at bedtime 10/31/13 2356 11/12/13 1059   10/31/13 2115  piperacillin-tazobactam (ZOSYN) IVPB 3.375 g     3.375 g 12.5 mL/hr over 240 Minutes Intravenous  Once 10/31/13 2114 11/06/13 2012       Assessment/Plan  1. POD 7, status post exploratory laparotomy with small bowel resection and drainage of interloop abscess  2. PCM/TNA, completed 3. Leukocytosis, mild decreased to 14 K.  Plan: 1. The patient is anxious about her minimal drop in her white blood cell count. She is anxious about stopping her antibiotics and going home without confirmation that there is nothing else going on in her abdomen causing her white blood cell count to remain elevated. Therefore, we will obtain a CT scan of her abdomen and pelvis today to rule out any complication or development of fluid collection. 2. Patient will go home with her wound VAC. This is scheduled to be changed today. 3. If the patient's CT scan is normal, she will be discontinued off of all antibiotic therapy and will be stable for discharge home later today. In the meantime, I have stopped her Invanz and we will await her CT scan prior to  determination whether she needs any further antibiotics or not. She has completed a total of 7 days after surgery and a total of 13 days since admission.    LOS: 12 days    Aeson Sawyers E 11/12/2013, 10:59 AM Pager: [phone]

## 2013-11-12 NOTE — Progress Notes (Signed)
Feeling better today but npo for CT. See PA note  Alert, nad Soft, nd. Wound vac inplace  For CT to evaluate persistent wbc (although slowly decreasing) Pt has been approp length of abx. Will stop abx and f/u on ct results If ct ok - anticipate dc Wednesday  Mary SellaEric M. Andrey CampanileWilson, MD, FACS General, Bariatric, & Minimally Invasive Surgery [facility], [place]

## 2013-11-12 NOTE — Progress Notes (Signed)
NUTRITION FOLLOW UP  Intervention:   - Continue to encourage PO intake. - RD will continue to monitor  Nutrition Dx:   Inadequate oral intake related to altered GI function as evidenced by NPO; resolved  Goal:   Pt will meet >90% of estimated nutritional needs; improving  Monitor:   Po intake, labs, weight changes, I/O's  Assessment:   Pt admitted with acute diverticulitis with perforation. S/p ex lap, appendectomy, and SBR with primary anastomosis on 11/05/13.  7/29: Pt reports very good appetite PTA and denies weight loss. Wt hx reveals wt maintenance. She reports that she avoids citrus type foods due to GERD.  Nutrition focused physical exam reveals no sings of fat or muscle depletion.  8/4: - TPN d/c'd yesterday - Pt s/p exploratory laparatory with small bowel resection and drainage of interloop abscess. Wound VAC in place - Pt having flatus and few small bowel movements. - Pt reports that her po intake is improving, and she is starting to feel hungry between meals. She denies the need for nutritional supplementation at this time.   Height: Ht Readings from Last 1 Encounters:  11/01/13 5\' 3"  (1.6 m)    Weight Status:   Wt Readings from Last 1 Encounters:  11/01/13 185 lb (83.915 kg)    Re-estimated needs:  Kcal: 1600-1800 Protein: 110-120 g Fluid: >1.8 L/day  Skin: closed incision on abdomen, wound vac in place  Diet Order: Cardiac   Intake/Output Summary (Last 24 hours) at 11/12/13 1041 Last data filed at 11/12/13 0545  Gross per 24 hour  Intake    340 ml  Output     60 ml  Net    280 ml    Last BM: 8/1   Labs:   Recent Labs Lab 11/06/13 0437 11/07/13 0520 11/08/13 0840 11/09/13 0445 11/11/13 0509  NA 140 137 140 134* 137  K 4.5 4.0 3.8 3.9 3.9  CL 101 101 99 99 99  CO2 17* 27 30 25 24   BUN 7 7 8 12 12   CREATININE 0.48* 0.37* 0.41* 0.41* 0.42*  CALCIUM 8.6 8.0* 8.6 8.6 9.1  MG 2.0 2.0  --   --  2.2  PHOS 4.3 2.2*  --  3.5 4.0  GLUCOSE  105* 121* 122* 133* 115*    CBG (last 3)  No results found for this basename: GLUCAP,  in the last 72 hours  Scheduled Meds: . enoxaparin (LOVENOX) injection  40 mg Subcutaneous Q24H  . ertapenem  1 g Intravenous QHS  . estradiol  0.05 mg Transdermal Q Fri  . latanoprost  1 drop Both Eyes QHS  . methocarbamol  1,000 mg Oral 3 times per day  . metoprolol  5 mg Intravenous 4 times per day  . pantoprazole (PROTONIX) IV  40 mg Intravenous QHS    Continuous Infusions:   Ebbie LatusHaley Hawkins RD, LDN

## 2013-11-13 MED ORDER — METHOCARBAMOL 500 MG PO TABS: 1000.0000 mg | ORAL_TABLET | Freq: Three times a day (TID) | ORAL | Status: DC

## 2013-11-13 MED ORDER — OXYCODONE-ACETAMINOPHEN 5-325 MG PO TABS: 1.0000 | ORAL_TABLET | ORAL | Status: DC | PRN

## 2013-11-13 NOTE — Discharge Summary (Signed)
Patient ID: Carol SnareJill D Pacha MRN: [id number] DOB/AGE: 59-Dec-1957 59 y.o.  Admit date: 10/31/2013 Discharge date: 11/13/2013  Procedures: EXPLORATORY LAPAROTOMY  APPENDECTOMY  SMALL BOWEL RESECTION  by Dr. Jimmye NormanJames Wyatt 11-05-2013  Consults: None  Reason for Admission: [age] yof who had onset acutely right sided/periumbilical abdominal pain today that has been progressive. Nothing was relieving this at home leading her to come to er. She has prior colonoscopy about 8 years ago that showed diverticuli only. She has sister that has had multiple colon surgeries. She denies fever. She had some nausea and emesis when she tried to eat. She had a bm this am.  Admission Diagnoses:  1. Perforated diverticulitis 2. HTN  Hospital Course:  The patient was admitted and made n.p.o. She was placed on IV Invanz with concerns for perforated diverticulitis based off of her CT scan. Should her white blood cell count of 26,000. Over the next several days her pain persisted. Her white blood cell count only minimally decreased to 25,000. She continued to have abdominal pain. After about 5 days with continued pain and no improvement, a repeat CT scan was obtained. This continued to show several small fluid collections that were non-drainable. It also still showed a small area of air. After evaluation of this scan there was concern for perforation of the small bowel as opposed to perforation from diverticulitis. Her white blood cell count finally decreased to 12,000, but her pain remains and even worsened on the following day. The decision was then made to proceed with an operation. She was taken to the operating room where she underwent exploratory laparotomy. She had an incidental appendectomy. She was noted to not have any evidence of acute diverticulitis but appeared to have a perforation in her small bowel. This area was resected. She was started on TNA do to an n.p.o. status. She had a postoperative ileus. Her wound was also  left open from surgery and a wound VAC was placed in her subcutaneous tissue. On postoperative day 4 she began passing flatus and have bowel movements. Her ileus that seemed to resolve and her NG tube was discontinued. Her diet was then able to be advanced as tolerated. Her white blood cell count did remain elevated around 15,000. On postoperative day 7 we repeated a CT scan which was normal. She remained afebrile. Given she completed a total of 13 days of IV Invanz, her antibiotics were discontinued given her normal CT scan findings. On postoperative day 8, the patient was tolerating a solid diet and her pain was well controlled. She was felt stable for discharge home at this time.  Discharge Diagnoses:  Active Problems:   Small bowel perforation with interloop abscess S/p ex lap with appendectomy and SBR    Discharge Medications:   Medication List         aspirin 81 MG tablet  Take 81 mg by mouth at bedtime.     cetirizine 10 MG tablet  Commonly known as:  ZYRTEC  Take 10 mg by mouth at bedtime.     chlorthalidone 25 MG tablet  Commonly known as:  HYGROTON  Take 25 mg by mouth every morning.     ibuprofen 200 MG tablet  Commonly known as:  ADVIL,MOTRIN  Take 400 mg by mouth every 6 (six) hours as needed for moderate pain.     latanoprost 0.005 % ophthalmic solution  Commonly known as:  XALATAN  Place 1 drop into both eyes at bedtime. As directed  methocarbamol 500 MG tablet  Commonly known as:  ROBAXIN  Take 2 tablets (1,000 mg total) by mouth every 8 (eight) hours.     metoprolol succinate 25 MG 24 hr tablet  Commonly known as:  TOPROL-XL  Take 25 mg by mouth every morning.     MINIVELLE 0.05 MG/24HR patch  Generic drug:  estradiol  Place 1 patch onto the skin See admin instructions. Takes usually mon and fri     niacin 1000 MG CR tablet  Commonly known as:  NIASPAN  Take 2 tablets (2,000 mg total) by mouth at bedtime.     omeprazole 20 MG capsule  Commonly  known as:  PRILOSEC  Take 20 mg by mouth daily.     oxyCODONE-acetaminophen 5-325 MG per tablet  Commonly known as:  PERCOCET/ROXICET  Take 1-2 tablets by mouth every 4 (four) hours as needed for moderate pain.     rosuvastatin 40 MG tablet  Commonly known as:  CRESTOR  Take 40 mg by mouth at bedtime.        Discharge Instructions:     Follow-up Information   Follow up with WYATT, Marta Lamas, MD In 3 weeks. Marcelino Duster should be calling you)    Specialty:  General Surgery   Contact information:   [address]  [place] [address] [phone]       Signed: Letha Cape 11/13/2013, 11:03 AM

## 2013-11-13 NOTE — Progress Notes (Signed)
[facility] discharge instructions were given and went over with patient. Patient was also given prescriptions for percocet and robaxin to take to her pharmacy. Patient stated that she did not have any questions. Volunteers called to assist patient to her transportation.

## 2013-11-13 NOTE — Discharge Summary (Signed)
Carol Heffernan M. Rosalie Buenaventura, MD, FACS General, Bariatric, & Minimally Invasive Surgery [facility]  

## 2013-11-13 NOTE — Discharge Instructions (Signed)
[facility]      [facility] °[phone] ° °OPEN ABDOMINAL SURGERY: POST OP INSTRUCTIONS ° °Always review your discharge instruction sheet given to you by the facility where your surgery was performed. ° °IF YOU HAVE DISABILITY OR FAMILY LEAVE FORMS, YOU MUST BRING THEM TO THE OFFICE FOR PROCESSING.  PLEASE DO NOT GIVE THEM TO YOUR DOCTOR. ° °1. A prescription for pain medication may be given to you upon discharge.  Take your pain medication as prescribed, if needed.  If narcotic pain medicine is not needed, then you may take acetaminophen (Tylenol) or ibuprofen (Advil) as needed. °2. Take your usually prescribed medications unless otherwise directed. °3. If you need a refill on your pain medication, please contact your pharmacy. They will contact our office to request authorization.  Prescriptions will not be filled after 5pm or on week-ends. °4. You should follow a light diet the first few days after arrival home, such as soup and crackers, pudding, etc.unless your doctor has advised otherwise. A high-fiber, low fat diet can be resumed as tolerated.   Be sure to include lots of fluids daily. Most patients will experience some swelling and bruising on the chest and neck area.  Ice packs will help.  Swelling and bruising can take several days to resolve °5. Most patients will experience some swelling and bruising in the area of the incision. Ice pack will help. Swelling and bruising can take several days to resolve..  °6. It is common to experience some constipation if taking pain medication after surgery.  Increasing fluid intake and taking a stool softener will usually help or prevent this problem from occurring.  A mild laxative (Milk of Magnesia or Miralax) should be taken according to package directions if there are no bowel movements after 48 hours. °7.  You may have steri-strips (small skin tapes) in place directly over the incision.  These strips should be left on the skin for 7-10 days.  If your  surgeon used skin glue on the incision, you may shower in 24 hours.  The glue will flake off over the next 2-3 weeks.  Any sutures or staples will be removed at the office during your follow-up visit. You may find that a light gauze bandage over your incision may keep your staples from being rubbed or pulled. You may shower and replace the bandage daily. °8. ACTIVITIES:  You may resume regular (light) daily activities beginning the next day--such as daily self-care, walking, climbing stairs--gradually increasing activities as tolerated.  You may have sexual intercourse when it is comfortable.  Refrain from any heavy lifting or straining until approved by your doctor. °a. You may drive when you no longer are taking prescription pain medication, you can comfortably wear a seatbelt, and you can safely maneuver your car and apply brakes °b. Return to Work: ___________________________________ °9. You should see your doctor in the office for a follow-up appointment approximately two weeks after your surgery.  Make sure that you call for this appointment within a day or two after you arrive home to insure a convenient appointment time. °OTHER INSTRUCTIONS:  °_____________________________________________________________ °_____________________________________________________________ ° °WHEN TO CALL YOUR DOCTOR: °1. Fever over 101.0 °2. Inability to urinate °3. Nausea and/or vomiting °4. Extreme swelling or bruising °5. Continued bleeding from incision. °6. Increased pain, redness, or drainage from the incision. °7. Difficulty swallowing or breathing °8. Muscle cramping or spasms. °9. Numbness or tingling in hands or feet or around lips. ° °The clinic staff is available to   answer your questions during regular business hours.  Please don’t hesitate to call and ask to speak to one of the nurses if you have concerns. ° °For further questions, please visit [email or web address] ° °Vacuum-Assisted Closure  Therapy °Vacuum-assisted closure (VAC) therapy uses a device that removes fluid and germs from wounds to help them heal. It is used on wounds that cannot be closed with stitches. They often heal slowly. Vacuum-assisted therapy helps the wound stay clean and healthy while the open wound slowly grows back together. °Vacuum-assisted closure therapy uses a bandage (dressing) that is made of foam. It is put inside the wound. Then, a drape is placed over the wound. This drape sticks to your skin to keep air out, and to protect the wound. A tube is hooked up to a small pump and is attached to the drape. The pump sucks out the fluid and germs. Vacuum-assisted closure therapy can also help reduce the bad smell that comes from the wound. °HOW DOES IT WORK?  °The vacuum pump pulls fluid through the foam dressing. The dressing may wrinkle during this process. The fluid goes into the tube and away from the wound. The fluid then goes into a container. The fluid in the container must be replaced if it is full or at least once a week, even if the container is not full. The pulling from the pump helps to close the wound and bring better circulation to the wound area.  The foam dressing covers and protects the wound. It helps your wound heal faster.  °HOW DOES IT FEEL?  °· You might feel a little pulling when the pump is on. °· You might also feel a mild vibrating sensation.   °· You might feel some discomfort when the dressing is taken off. °CAN I MOVE AROUND WITH VACUUM-ASSISTED CLOSURE THERAPY? °Yes, it has a backup battery which is used when the machine is not plugged in, as long as the battery is working, you can move freely. °WHAT ARE SOME THINGS I MUST KNOW? °· Do not turn off the pump yourself, unless instructed to do so by your healthcare provider, such as for bathing. °· Do not take off the dressing yourself, unless instructed to do so by your caregiver. °· You can wash or shower with the dressing. However, do not take the  pump into the shower. Make sure the wound dressing is protected and covered with plastic. The wound area must stay dry. °· Do not turn off the pump for more than 2 hours. If the pump is off for more than 2 hours, your nurse must change your dressing. °· Check frequently that the machine is on, that the machine indicates the therapy is on, and that all clamps are open. °THE ALARM IS SOUNDING! WHAT SHOULD I DO?  °· Stay calm. °· Do not turn off the pump or do anything with the dressing. °· Call your clinic or caregiver right away if the alarm goes off and you cannot fix the problem. Some reasons the alarm might go off include: °¨ The fluid collection container is full. °¨ The battery is low. °¨ The dressing has a leak. °· Explain to your caregiver what is happening. Follow the instructions you receive. °WHEN SHOULD I CALL FOR HELP?  °· You have severe pain. °· You have difficulty breathing. °· You have bleeding that will not stop. °· Your wound smells bad. °· You have redness, swelling, or fluid leaking from your wound. °· Your alarm goes off and you do not   know what to do. °· You have a fever. °· Your wound itches severely. °· Your dressing changes are often painful or bleeding often occurs. °· You have diarrhea. °· You have a sore throat. °· You have a rash around the dressing or anywhere else on your body. °· You feel nauseous. °· You feel dizzy or weak. °· The VAC machine has been off for more than 2 hours. °HOW DO I GET READY TO GO HOME WITH A PUMP?  °A trained caregiver will talk to you and answer your questions about your vacuum-assisted closure therapy before you go home. He or she will explain what to expect. A caregiver will come to your home to apply the pump and care for your wound.  The at-home caregiver will be available for questions and will come back for the scheduled dressing changes, usually every 48-72 hours (or more often for severely infected wounds). Your at-home caregiver will also come if you  are having an unexpected problem. If you have questions or do not know what to do when you go home, talk to your healthcare provider. °Document Released: 03/10/2008 Document Revised: 11/28/2012 Document Reviewed: 03/11/2011 °ExitCare® Patient Information ©2015 [employer]. This information is not intended to replace advice given to you by your health care provider. Make sure you discuss any questions you have with your health care provider. ° °

## 2013-11-18 ENCOUNTER — Telehealth (INDEPENDENT_AMBULATORY_CARE_PROVIDER_SITE_OTHER): Payer: Self-pay

## 2013-11-18 ENCOUNTER — Other Ambulatory Visit (INDEPENDENT_AMBULATORY_CARE_PROVIDER_SITE_OTHER): Payer: Self-pay

## 2013-11-18 DIAGNOSIS — G8918 Other acute postprocedural pain: Secondary | ICD-10-CM

## 2013-11-18 MED ORDER — OXYCODONE-ACETAMINOPHEN 5-325 MG PO TABS: 1.0000 | ORAL_TABLET | Freq: Four times a day (QID) | ORAL | Status: DC | PRN

## 2013-11-18 NOTE — Telephone Encounter (Signed)
Informed pt that a Percocet Rx will be at the front desk for pickup. Pt verbalized understanding

## 2013-11-18 NOTE — Telephone Encounter (Signed)
Pt s/p exp lap on 11/05/13 by Dr Lindie SpruceWyatt. Pts sister is calling today to get a refill on her Percocet 5/325mg . Pt rates her pain a 6 at this time. Pt denies any n/v, any chills. Pt has had some constipation and a low grade fever 99.8. Pt advised that she can take Tylenol for the fever and if not relieved to give us a call back.  Pt advised that she can try and take Ibuprofen instead of Percocet to help relieve the constipation, drink plenty of fluids especially water, move around as possible. Advised pt that she can also place heat/ice on the area as well for pain relief. Informed pt that I would send Dr Lindie SpruceWyatt a message for a refill request and that we will call her as soon as we receive a response back.

## 2013-11-19 ENCOUNTER — Telehealth (INDEPENDENT_AMBULATORY_CARE_PROVIDER_SITE_OTHER): Payer: Self-pay

## 2013-11-19 NOTE — Telephone Encounter (Signed)
She can have a refill on her Percocet, #40

## 2013-11-19 NOTE — Telephone Encounter (Signed)
LMOM with appt info. 9/1 at 8:30 with Dr Lindie SpruceWyatt

## 2013-11-19 NOTE — Telephone Encounter (Signed)
Message copied by Brennan BaileyBROOKS, Rayvon Brandvold on Tue Nov 19, 2013 11:52 AM ------      Message from: Elliot CousinWITTY, JENNIFER      Created: Mon Nov 18, 2013  3:28 PM       Pt called in for a f/u with Dr. Lindie SpruceWyatt.  She was requesting 12-03-13 but I let her know that he wasn't in the office that day, but I would get you to work her in somewhere , lol.            Just let me know if you want me to call pt.            [name] ------

## 2013-11-22 ENCOUNTER — Telehealth (INDEPENDENT_AMBULATORY_CARE_PROVIDER_SITE_OTHER): Payer: Self-pay

## 2013-11-22 ENCOUNTER — Encounter (INDEPENDENT_AMBULATORY_CARE_PROVIDER_SITE_OTHER): Payer: Self-pay | Admitting: General Surgery

## 2013-11-22 NOTE — Telephone Encounter (Signed)
Called pt back regarding her question. She states her insurance company will not approve the need for VAC. She is requesting letter from Dr Lindie SpruceWyatt to submit to insurance company to try and get them to approve it. Dr Lindie SpruceWyatt typed the letter and it was faxed to [phone].

## 2013-11-22 NOTE — Telephone Encounter (Signed)
Message copied by Brennan BaileyBROOKS, Burma Ketcher on Fri Nov 22, 2013  1:05 PM ------      Message from: Leanne ChangGALLOWAY, WENDY K      Created: Fri Nov 22, 2013  9:13 AM      Regarding: Dr Webb LawsWyatt      Contact: [phone]       Questions about wound vac. Need call back today. ------

## 2013-11-25 ENCOUNTER — Telehealth (INDEPENDENT_AMBULATORY_CARE_PROVIDER_SITE_OTHER): Payer: Self-pay

## 2013-11-25 NOTE — Telephone Encounter (Signed)
Pt s/p exp lap with Dr Lindie SpruceWyatt. [facility] nurse would like to see if the pt can get a sooner appt (she has one scheduled for 9/1). Carol [name] states that pt right side of her abdomen is larger than her left. She states that at times the area is res,tender and warm to touch. Pt has also had low grade fevers, she has been taking Tylenol and they have subsided. Informed Carol [name] that I would send a message to see if Dr Lindie SpruceWyatt could possibly see pt on 11/29/13, or of he would like pt to come in and see on of his partners before. Informed Carol [name] that we would contact her as soon as we received a message. Please advise.

## 2013-11-26 NOTE — Telephone Encounter (Signed)
[name] from [facility] called and was checking on the message she left yesterday regarding a sooner apt for pt.  I advised her that the message has been sent to Dr. Lindie SpruceWyatt and his assistant and she should be hearing something in the next day.  She verbalized understanding.  [name]

## 2013-11-27 ENCOUNTER — Telehealth (INDEPENDENT_AMBULATORY_CARE_PROVIDER_SITE_OTHER): Payer: Self-pay

## 2013-11-27 NOTE — Telephone Encounter (Signed)
Message copied by Brennan BaileyBROOKS, Burech Mcfarland on Wed Nov 27, 2013  1:53 PM ------      Message from: Kerrin ChampagneLOWERY, TENA M      Created: Wed Nov 27, 2013 10:53 AM      Contact: [phone]       Pt thought she was coming in this Friday to see Lindie SpruceWyatt, she stated someone will call her back to see if she can come this Friday ------

## 2013-11-27 NOTE — Telephone Encounter (Signed)
LMOM with appt info for Friday 8/21 at 11:15

## 2013-11-29 ENCOUNTER — Ambulatory Visit (INDEPENDENT_AMBULATORY_CARE_PROVIDER_SITE_OTHER): Payer: 59 | Admitting: General Surgery

## 2013-11-29 ENCOUNTER — Encounter (INDEPENDENT_AMBULATORY_CARE_PROVIDER_SITE_OTHER): Payer: Self-pay | Admitting: General Surgery

## 2013-11-29 ENCOUNTER — Telehealth (INDEPENDENT_AMBULATORY_CARE_PROVIDER_SITE_OTHER): Payer: Self-pay

## 2013-11-29 VITALS — BP 148/82 | HR 68 | Temp 98.1°F | Resp 18

## 2013-11-29 DIAGNOSIS — Z09 Encounter for follow-up examination after completed treatment for conditions other than malignant neoplasm: Secondary | ICD-10-CM

## 2013-11-29 DIAGNOSIS — K5732 Diverticulitis of large intestine without perforation or abscess without bleeding: Secondary | ICD-10-CM

## 2013-11-29 DIAGNOSIS — D649 Anemia, unspecified: Secondary | ICD-10-CM

## 2013-11-29 NOTE — Progress Notes (Signed)
Subjective:     Patient ID: Carol SnareJill D Eisenberg, female   DOB: 21-Mar-1956, 58 y.o.   MRN: [id number]  HPI The patient is doing well status post small bowel resection for interloop small bowel infection and abscess associated with acute diverticulitis with perforation. No colon resection was performed.  The patient has an open wound with a negative pressure wound device in place. She remarks that it is very tender and uncomfortable especially during dressing changes.  Otherwise she states That she's doing well. She is having regular bowel movements. She is eating well.  Review of Systems No high-grade fevers. No chills. No nausea or vomiting.    Objective:   Physical Exam A midline negative pressure wound of eyes was removed. There was brisk bleeding from the excellent granulation tissue. A silver nitrate stick application was required in order to control bleeding on the left side of her open wound. This controlled the bleeding well the wet to dry dressing was applied.  She has some mild to moderate asymmetry of the right half of her abdominal wound which is likely just secondary to the asymmetry of the subcutaneous tissue. There does not appear to be an intra-abdominal process causing protrusion.  There is no deep abdominal tenderness. She has normal active bowel sounds.    Assessment:     Doing well status post small bowel resection for small bowel abscess related to acute perforated diverticulitis.  Because the dressing change for the negative pressure wound advised is so tender and the patient bleeds significantly we're changing to wet to dry dressings with sterile saline and either Kerlix and 4 x 4 gauze to be done twice a day. Home health services will be contacted for the change and because the family is familiar with doing these types of dressings they will do the large portion of these weekly. Home health will come out and examine the wound to give us enough date of the report.     Plan:      Policy the patient again on September 1. She needs a GI referral because of her diverticular disease. Also her white blood cell count was elevated and her hemoglobin was low at the time of discharge. We will recheck that today.  If there are any changes that are needed we will get in contact with the patient. No antibiotics are needed at this time.

## 2013-11-29 NOTE — Telephone Encounter (Signed)
Per Dr Dixon BoosWyatt's request wd vac removed and d/c.Order change for saline moist gauze packing and cover dry dsg bid. Teach family member to do wd care and monitor wd 2-3x a week called [facility]. To begin new wd care tomorrow. Pt sent for cbc today at [facility] per Dr Lavella LemonsWyatts request. Pt to be referred to GI for colonoscopy. Order in epic and given to ref coord with msg pt to call back.  Pt will call back with name and # of GI MD that she wants to be referred to. Pt will keep f/u appt with Dr Lindie SpruceWyatt 12-10-13.

## 2013-11-30 LAB — CBC WITH DIFFERENTIAL/PLATELET
Basophils Absolute: 0.1 10*3/uL (ref 0.0–0.1)
Basophils Relative: 1 % (ref 0–1)
Eosinophils Absolute: 0.2 10*3/uL (ref 0.0–0.7)
Eosinophils Relative: 2 % (ref 0–5)
HEMATOCRIT: 35.2 % — AB (ref 36.0–46.0)
HEMOGLOBIN: 11.6 g/dL — AB (ref 12.0–15.0)
Lymphocytes Relative: 33 % (ref 12–46)
Lymphs Abs: 3.5 10*3/uL (ref 0.7–4.0)
MCH: 27.2 pg (ref 26.0–34.0)
MCHC: 33 g/dL (ref 30.0–36.0)
MCV: 82.4 fL (ref 78.0–100.0)
MONO ABS: 0.9 10*3/uL (ref 0.1–1.0)
MONOS PCT: 8 % (ref 3–12)
Neutro Abs: 6 10*3/uL (ref 1.7–7.7)
Neutrophils Relative %: 56 % (ref 43–77)
Platelets: 514 10*3/uL — ABNORMAL HIGH (ref 150–400)
RBC: 4.27 MIL/uL (ref 3.87–5.11)
RDW: 14 % (ref 11.5–15.5)
WBC: 10.7 10*3/uL — ABNORMAL HIGH (ref 4.0–10.5)

## 2013-12-03 ENCOUNTER — Telehealth (INDEPENDENT_AMBULATORY_CARE_PROVIDER_SITE_OTHER): Payer: Self-pay

## 2013-12-03 NOTE — Telephone Encounter (Signed)
Pt called for CBC results which are normal.  Hgb still just slightly low but much improved.  Dr. Lindie Spruce to be made aware.

## 2013-12-04 NOTE — Telephone Encounter (Signed)
I was aware of the results.  Glad that this has been shared with the patient.  Nothing to change about her treatment.  Let her know that her WBC was normal also, indicating that her infection is controlled.

## 2013-12-06 ENCOUNTER — Telehealth (INDEPENDENT_AMBULATORY_CARE_PROVIDER_SITE_OTHER): Payer: Self-pay

## 2013-12-06 DIAGNOSIS — B372 Candidiasis of skin and nail: Secondary | ICD-10-CM

## 2013-12-06 MED ORDER — NYSTATIN 100000 UNIT/GM EX POWD: 1.0000 g | Freq: Two times a day (BID) | CUTANEOUS | Status: DC

## 2013-12-06 NOTE — Addendum Note (Signed)
Addended by: Brennan Bailey on: 12/06/2013 04:38 PM   Modules accepted: Orders

## 2013-12-06 NOTE — Telephone Encounter (Signed)
Spoke to Dr Derrell Lolling our urgent office doctor and he okayed prescription if the area of concern was in the fold of the pannus. Unlikely yeast infection if it is near her wound. After calling patient back she confirmed it is in her pannus. Advised we will send prescription to her pharmacy.

## 2013-12-06 NOTE — Telephone Encounter (Signed)
Pt called stating her niece that is a nurse advised pt the irritation she has at bottom of abd is a yeast infection. Pt is requesting rx for Nystatin powder be sent to her pharmacy. Pt advised this request will be sent to Dr Lindie Spruce to review. Pt can be reached at [phone].

## 2013-12-10 ENCOUNTER — Encounter (INDEPENDENT_AMBULATORY_CARE_PROVIDER_SITE_OTHER): Payer: 59 | Admitting: General Surgery

## 2013-12-24 ENCOUNTER — Other Ambulatory Visit (INDEPENDENT_AMBULATORY_CARE_PROVIDER_SITE_OTHER): Payer: 59

## 2013-12-24 DIAGNOSIS — I519 Heart disease, unspecified: Secondary | ICD-10-CM

## 2013-12-24 DIAGNOSIS — E785 Hyperlipidemia, unspecified: Secondary | ICD-10-CM

## 2013-12-24 LAB — HEPATIC FUNCTION PANEL
ALBUMIN: 3.7 g/dL (ref 3.5–5.2)
ALK PHOS: 63 U/L (ref 39–117)
ALT: 18 U/L (ref 0–35)
AST: 16 U/L (ref 0–37)
BILIRUBIN DIRECT: 0 mg/dL (ref 0.0–0.3)
BILIRUBIN TOTAL: 0.4 mg/dL (ref 0.2–1.2)
Total Protein: 7.8 g/dL (ref 6.0–8.3)

## 2013-12-25 ENCOUNTER — Telehealth: Payer: Self-pay | Admitting: Cardiology

## 2013-12-25 NOTE — Telephone Encounter (Signed)
Notified of lab results. 

## 2013-12-25 NOTE — Telephone Encounter (Signed)
New message ° ° ° ° °Returning Anita's call °

## 2013-12-26 LAB — NMR LIPOPROFILE WITH LIPIDS
CHOLESTEROL, TOTAL: 124 mg/dL (ref 100–199)
HDL PARTICLE NUMBER: 28.9 umol/L — AB (ref >=30.5)
HDL Size: 9.9 nm (ref >=9.2)
HDL-C: 40 mg/dL (ref >39)
LDL CALC: 56 mg/dL (ref 0–99)
LDL Particle Number: 623 nmol/L (ref <1000)
LDL Size: 20.9 nm (ref >=20.8)
LP-IR SCORE: 47 — AB (ref <=45)
Large HDL-P: 6.6 umol/L (ref >=4.8)
Large VLDL-P: 5.7 nmol/L — ABNORMAL HIGH (ref <=2.7)
Small LDL Particle Number: 388 nmol/L (ref <=527)
TRIGLYCERIDES: 140 mg/dL (ref 0–149)
VLDL SIZE: 49.3 nm — AB (ref <=46.6)

## 2013-12-30 ENCOUNTER — Encounter (INDEPENDENT_AMBULATORY_CARE_PROVIDER_SITE_OTHER): Payer: 59 | Admitting: General Surgery

## 2013-12-31 ENCOUNTER — Other Ambulatory Visit (INDEPENDENT_AMBULATORY_CARE_PROVIDER_SITE_OTHER): Payer: Self-pay

## 2013-12-31 DIAGNOSIS — B372 Candidiasis of skin and nail: Secondary | ICD-10-CM

## 2013-12-31 MED ORDER — NYSTATIN 100000 UNIT/GM EX POWD: 1.0000 g | Freq: Two times a day (BID) | CUTANEOUS | Status: DC

## 2013-12-31 MED ORDER — NYSTATIN 100000 UNIT/GM EX CREA: 1.0000 "application " | TOPICAL_CREAM | Freq: Two times a day (BID) | CUTANEOUS | Status: DC

## 2014-01-06 ENCOUNTER — Other Ambulatory Visit: Payer: Self-pay | Admitting: *Deleted

## 2014-01-06 DIAGNOSIS — Z79899 Other long term (current) drug therapy: Secondary | ICD-10-CM

## 2014-01-06 DIAGNOSIS — E7800 Pure hypercholesterolemia, unspecified: Secondary | ICD-10-CM

## 2014-01-24 ENCOUNTER — Other Ambulatory Visit: Payer: Self-pay

## 2014-01-28 DIAGNOSIS — K5730 Diverticulosis of large intestine without perforation or abscess without bleeding: Secondary | ICD-10-CM | POA: Insufficient documentation

## 2014-03-24 ENCOUNTER — Telehealth (INDEPENDENT_AMBULATORY_CARE_PROVIDER_SITE_OTHER): Payer: Self-pay

## 2014-03-24 ENCOUNTER — Other Ambulatory Visit: Payer: Self-pay

## 2014-03-24 MED ORDER — NIACIN ER (ANTIHYPERLIPIDEMIC) 1000 MG PO TBCR: 2000.0000 mg | EXTENDED_RELEASE_TABLET | Freq: Every day | ORAL | Status: DC

## 2014-03-24 NOTE — Telephone Encounter (Signed)
Pt called to report incision area is very irritated, inflammed, red, and painful.  She has also had some drainage over the weekend.  She has no fever.  She is given an appt to see the physician in urgent office for today, but she wants Dr. Lindie SpruceWyatt to know.  We will send a message.  SL

## 2014-03-26 NOTE — Telephone Encounter (Signed)
I need her to be seen or for me to see her.  Otherwise is she doing okay?  Could be related to sutures in her incision.

## 2014-03-26 NOTE — Telephone Encounter (Signed)
She was seen by Dr. Dwain SarnaWakefield in [place] office on 12/14. He dx the area as yeast and prescribed trx. He wanted her to see you in a week.  Thanks.  SL

## 2014-03-26 NOTE — Telephone Encounter (Signed)
Pt called to inform Dr Lindie SpruceWyatt that she does not feel that the Nystatin powder has been helping. Pt states the drainage has gone away however the area is still red and she would like to come in to have Dr Lindie SpruceWyatt look at this area. Informed her that I would send Dr Lindie SpruceWyatt a message

## 2014-03-26 NOTE — Telephone Encounter (Signed)
Find a time with Marcelino DusterMichelle.  I would be glad to see her

## 2014-03-26 NOTE — Telephone Encounter (Signed)
Called pt with appt 

## 2014-04-23 ENCOUNTER — Other Ambulatory Visit: Payer: Self-pay

## 2014-04-23 MED ORDER — METOPROLOL SUCCINATE ER 25 MG PO TB24: 25.0000 mg | ORAL_TABLET | Freq: Every morning | ORAL | Status: DC

## 2014-04-23 MED ORDER — ROSUVASTATIN CALCIUM 40 MG PO TABS: 40.0000 mg | ORAL_TABLET | Freq: Every day | ORAL | Status: DC

## 2014-07-01 ENCOUNTER — Other Ambulatory Visit (INDEPENDENT_AMBULATORY_CARE_PROVIDER_SITE_OTHER): Payer: 59 | Admitting: *Deleted

## 2014-07-01 DIAGNOSIS — E7800 Pure hypercholesterolemia, unspecified: Secondary | ICD-10-CM

## 2014-07-01 DIAGNOSIS — Z79899 Other long term (current) drug therapy: Secondary | ICD-10-CM

## 2014-07-01 LAB — HEPATIC FUNCTION PANEL
ALBUMIN: 4.2 g/dL (ref 3.5–5.2)
ALT: 20 U/L (ref 0–35)
AST: 20 U/L (ref 0–37)
Alkaline Phosphatase: 63 U/L (ref 39–117)
BILIRUBIN TOTAL: 0.4 mg/dL (ref 0.2–1.2)
Bilirubin, Direct: 0.1 mg/dL (ref 0.0–0.3)
TOTAL PROTEIN: 8 g/dL (ref 6.0–8.3)

## 2014-07-03 ENCOUNTER — Other Ambulatory Visit: Payer: Self-pay

## 2014-07-03 LAB — NMR LIPOPROFILE WITH LIPIDS
Cholesterol, Total: 121 mg/dL (ref 100–199)
HDL PARTICLE NUMBER: 33.1 umol/L (ref >=30.5)
HDL Size: 9.4 nm (ref >=9.2)
HDL-C: 46 mg/dL (ref >39)
LARGE HDL: 4 umol/L — AB (ref >=4.8)
LDL (calc): 51 mg/dL (ref 0–99)
LDL Particle Number: 861 nmol/L (ref <1000)
LDL Size: 20.5 nm (ref >=20.8)
LP-IR Score: 51 — ABNORMAL HIGH (ref <=45)
Large VLDL-P: 2.2 nmol/L (ref <=2.7)
Small LDL Particle Number: 483 nmol/L (ref <=527)
TRIGLYCERIDES: 122 mg/dL (ref 0–149)
VLDL Size: 53 nm — ABNORMAL HIGH (ref <=46.6)

## 2014-07-03 MED ORDER — NIACIN ER (ANTIHYPERLIPIDEMIC) 1000 MG PO TBCR: 2000.0000 mg | EXTENDED_RELEASE_TABLET | Freq: Every day | ORAL | Status: DC

## 2014-07-28 ENCOUNTER — Encounter: Payer: Self-pay | Admitting: Cardiology

## 2014-07-28 ENCOUNTER — Ambulatory Visit (INDEPENDENT_AMBULATORY_CARE_PROVIDER_SITE_OTHER): Payer: 59 | Admitting: Cardiology

## 2014-07-28 VITALS — BP 118/78 | HR 68 | Ht 63.0 in | Wt 188.8 lb

## 2014-07-28 DIAGNOSIS — K5720 Diverticulitis of large intestine with perforation and abscess without bleeding: Secondary | ICD-10-CM | POA: Diagnosis not present

## 2014-07-28 DIAGNOSIS — E785 Hyperlipidemia, unspecified: Secondary | ICD-10-CM

## 2014-07-28 DIAGNOSIS — R0789 Other chest pain: Secondary | ICD-10-CM

## 2014-07-28 DIAGNOSIS — I10 Essential (primary) hypertension: Secondary | ICD-10-CM

## 2014-07-28 NOTE — Progress Notes (Signed)
[address] [place], [place]  [address] Phone: [phone] Fax:  [phone]  Date:  07/28/2014   ID:  Carol [name], DOB July 03, 1955, MRN [id number]  PCP:  Lenora Boys, MD   History of Present Illness: Carol [name] is a 59 y.o. female with hypertension, strong family history of coronary artery disease, hyperlipidemia here for followup.  She is post small bowel resection for small bowel abscess related to acute perforated diverticulitis back in July 2015. Quite significant pain involved with this. Dr. Lindie Spruce performed procedure. During that hospitalization she took some insulin and her hemoglobin A1c was noted to be in the low 6 range. She has remained off of medication but is very motivated to try to get help losing weight and reducing her risk of further diabetes.  Hypertension-amlodipine was used in the past however she is now longer on this medication she is doing well. She did have some mild edema in the past with amlodipine. She seems to be very well maintained on chlorthalidone and metoprolol.  She also denies any return of atypical chest discomfort under her left breast.  Her hyperlipidemia management has been adjusted by [employer], Pharm.D. with recent increase in Niaspan.  Plantar fasciitis has been an issue.    Wt Readings from Last 3 Encounters:  07/28/14 188 lb 12.8 oz (85.639 kg)  11/01/13 185 lb (83.915 kg)  02/14/13 187 lb (84.823 kg)     Past Medical History  Diagnosis Date  . Chest discomfort     , Atypical  . Systolic dysfunction     , Hyperdynamic  . Gastroesophageal reflux disease   . Hyperlipidemia   . Hypertension     Past Surgical History  Procedure Laterality Date  . Total abdominal hysterectomy    . Laparotomy N/A 11/05/2013    Procedure: EXPLORATORY LAPAROTOMY;  Surgeon: Cherylynn Ridges, MD;  Location: [facility] OR;  Service: General;  Laterality: N/A;  . Appendectomy N/A 11/05/2013    Procedure: APPENDECTOMY;  Surgeon:  Cherylynn Ridges, MD;  Location: [facility] OR;  Service: General;  Laterality: N/A;  . Bowel resection N/A 11/05/2013    Procedure: SMALL BOWEL RESECTION;  Surgeon: Cherylynn Ridges, MD;  Location: [facility] OR;  Service: General;  Laterality: N/A;    Current Outpatient Prescriptions  Medication Sig Dispense Refill  . aspirin 81 MG tablet Take 81 mg by mouth at bedtime.     . cetirizine (ZYRTEC) 10 MG tablet Take 10 mg by mouth at bedtime.     . chlorthalidone (HYGROTON) 25 MG tablet Take 25 mg by mouth every morning.    . latanoprost (XALATAN) 0.005 % ophthalmic solution Place 1 drop into both eyes at bedtime. As directed    . metoprolol succinate (TOPROL-XL) 25 MG 24 hr tablet Take 1 tablet (25 mg total) by mouth every morning. 30 tablet 6  . MINIVELLE 0.05 MG/24HR patch Place 1 patch onto the skin See admin instructions. Takes usually mon and fri    . niacin (NIASPAN) 1000 MG CR tablet Take 2 tablets (2,000 mg total) by mouth at bedtime. 60 tablet 8  . omeprazole (PRILOSEC) 20 MG capsule Take 20 mg by mouth daily.    . rosuvastatin (CRESTOR) 40 MG tablet Take 1 tablet (40 mg total) by mouth at bedtime. 30 tablet 6   No current facility-administered medications for this visit.    Allergies:    Allergies  Allergen Reactions  . Ivp Dye [  Iodinated Diagnostic Agents] Hives and Shortness Of Breath  . Lisinopril Cough  . Sulfa Antibiotics Hives    Social History:  The patient  reports that she has never smoked. She does not have any smokeless tobacco history on file. She reports that she drinks alcohol. She reports that she does not use illicit drugs.   FHX: Sister had similar bowel issue.   ROS:  Please see the history of present illness.   Denies any syncope, bleeding, orthopnea, PND   PHYSICAL EXAM: VS:  BP 118/78 mmHg  Pulse 68  Ht 5\' 3"  (1.6 m)  Wt 188 lb 12.8 oz (85.639 kg)  BMI 33.45 kg/m2 Well nourished, well developed, in no acute distress HEENT: normal Neck: no JVD Cardiac:  normal S1,  S2; RRR; no murmur Lungs:  clear to auscultation bilaterally, no wheezing, rhonchi or rales Abd: soft, nontender, no hepatomegalyAbd scar Ext: no edema.  Skin: warm and dry Neuro: no focal abnormalities noted  EKG:  None today LDL-P - 1153 on 4/14 LDL count 51 on 07/01/14   ASSESSMENT AND PLAN:  1. Chest pain-no further episodes. Doing well. Continuing with aggressive primary prevention. 2. Hypertension-currently good control.  On chlorthalidone and metoprolol. 3. Obesity-encourage weight loss. BMI currently 33. Unchanged . She is very motivated to lose weight however. She would like more specific guidance and I have given her a pamphlet for the [facility] exercise program which focuses on health coaching and personalized exercise prescription, dietary modification. Discussed weight loss. Her husband has been a vegan. She went through quite a significant abdominal episode July 2015 as noted above. 4. Hyperlipidemia-Jeremy Smart, Pharm.D has worked with her in the past.  Currently on both Crestor as well as niacin. We will continue with this regimen. Excellent control.  Signed, Donato SchultzMark Skains, MD [facility]  07/28/2014 10:33 AM

## 2014-07-28 NOTE — Patient Instructions (Signed)
The current medical regimen is effective;  continue present plan and medications.  Follow up in 1 year with Dr. Skains.  You will receive a letter in the mail 2 months before you are due.  Please call us when you receive this letter to schedule your follow up appointment.  Thank you for choosing [facility]!!     

## 2014-12-02 ENCOUNTER — Other Ambulatory Visit: Payer: Self-pay

## 2014-12-02 MED ORDER — ROSUVASTATIN CALCIUM 40 MG PO TABS: 40.0000 mg | ORAL_TABLET | Freq: Every day | ORAL | Status: DC

## 2014-12-02 NOTE — Telephone Encounter (Signed)
1. Hyperlipidemia-Jeremy Smart, Pharm.D has worked with her in the past. Currently on both Crestor as well as niacin. We will continue with this regimen. Excellent control.  Signed, Donato Schultz, MD [facility]  07/28/2014 10:33 AM   rosuvastatin (CRESTOR) 40 MG tablet Take 1 tablet (40 mg total) by mouth at bedtime.

## 2014-12-10 ENCOUNTER — Other Ambulatory Visit: Payer: Self-pay

## 2014-12-10 MED ORDER — METOPROLOL SUCCINATE ER 25 MG PO TB24: 25.0000 mg | ORAL_TABLET | Freq: Every morning | ORAL | Status: DC

## 2015-01-01 ENCOUNTER — Other Ambulatory Visit: Payer: Self-pay

## 2015-01-01 DIAGNOSIS — Z1231 Encounter for screening mammogram for malignant neoplasm of breast: Secondary | ICD-10-CM

## 2015-01-29 ENCOUNTER — Ambulatory Visit
Admission: RE | Admit: 2015-01-29 | Discharge: 2015-01-29 | Disposition: A | Discharge status: Home / Self Care | Payer: 59 | Source: Ambulatory Visit

## 2015-01-29 DIAGNOSIS — Z1231 Encounter for screening mammogram for malignant neoplasm of breast: Secondary | ICD-10-CM

## 2015-03-30 ENCOUNTER — Telehealth: Payer: Self-pay | Admitting: Pharmacist

## 2015-03-30 NOTE — Telephone Encounter (Signed)
Received call from patient's MD, Dr. Foy GuadalajaraFried regarding pt's cholesterol medicine. Riki RuskJeremy has followed her in the past. Informed MD that he no longer works in the lipid clinic. Per MD, pt is prediabetic and he wanted to stop niacin d/t glucose effects. Advised that this is ok. Pt will continue on Crestor 40 mg daily. Also updated med list with other changes made by Dr. Foy GuadalajaraFried - d/c chlorthalidone and initiation of candesartan.

## 2015-05-11 ENCOUNTER — Telehealth: Payer: Self-pay

## 2015-05-11 NOTE — Telephone Encounter (Signed)
Prior auth for Crestor 40 mg sent to [employer]

## 2015-05-27 ENCOUNTER — Telehealth: Payer: Self-pay

## 2015-05-27 NOTE — Telephone Encounter (Signed)
Brand name Crestor denied by [facility]. She got a letter from them as well. She has already switched to Rosuvastatin and is tolerating it Ok. Is due for OV and labs in April. Transferred her to scheduling.

## 2015-06-15 ENCOUNTER — Other Ambulatory Visit: Payer: Self-pay | Admitting: *Deleted

## 2015-06-15 MED ORDER — ROSUVASTATIN CALCIUM 40 MG PO TABS: 40.0000 mg | ORAL_TABLET | Freq: Every day | ORAL | Status: DC

## 2015-07-28 ENCOUNTER — Encounter: Payer: Self-pay | Admitting: Cardiology

## 2015-07-28 ENCOUNTER — Ambulatory Visit (INDEPENDENT_AMBULATORY_CARE_PROVIDER_SITE_OTHER): Payer: 59 | Admitting: Cardiology

## 2015-07-28 VITALS — BP 114/60 | HR 52 | Ht 63.0 in | Wt 190.0 lb

## 2015-07-28 DIAGNOSIS — I10 Essential (primary) hypertension: Secondary | ICD-10-CM

## 2015-07-28 NOTE — Patient Instructions (Signed)
Medication Instructions:  The current medical regimen is effective;  continue present plan and medications.  Follow-Up: Follow up as needed with Dr Skains.  If you need a refill on your cardiac medications before your next appointment, please call your pharmacy.  Thank you for choosing [facility]!!     

## 2015-07-28 NOTE — Progress Notes (Signed)
[address] [place], [place]  [id number] Phone: [phone]) [phone] Fax:  [phone]) [phone]  Date:  07/28/2015   ID:  Carol [name] Hutchinson, DOB 1955/07/17, MRN [id number]  PCP:  Ethel RanaHEPLER,MARK, PA-C   History of Present Illness: Carol [name] Tabbert is a 60 y.o. female with hypertension, strong family history of coronary artery disease, hyperlipidemia here for followup.  She is post small bowel resection for small bowel abscess related to acute perforated diverticulitis back in July 2015. Quite significant pain involved with this. Dr. Lindie SpruceWyatt performed procedure. During that hospitalization she took some insulin and her hemoglobin A1c was noted to be in the low 6 range. She has remained off of medication but is very motivated to try to get help losing weight and reducing her risk of further diabetes.  Hypertension-amlodipine was used in the past however she is now longer on this medication she is doing well. She did have some mild edema in the past with amlodipine. She seems to be very well maintained on chlorthalidone and metoprolol.  She also denies any return of atypical chest discomfort under her left breast.  Previously had been on Niaspan. This was discontinued. She is on Crestor 40 mg. She has been watching closely her hemoglobin A1c, glucose levels. Weight loss has been fluctuating. Even with a 20 pound weight loss she still had some elevated glucose levels.  Plantar fasciitis has been an issue.    Wt Readings from Last 3 Encounters:  07/28/15 190 lb (86.183 kg)  07/28/14 188 lb 12.8 oz (85.639 kg)  11/01/13 185 lb (83.915 kg)     Past Medical History  Diagnosis Date  . Chest discomfort     , Atypical  . Systolic dysfunction     , Hyperdynamic  . Gastroesophageal reflux disease   . Hyperlipidemia   . Hypertension     Past Surgical History  Procedure Laterality Date  . Total abdominal hysterectomy    . Laparotomy N/A 11/05/2013    Procedure: EXPLORATORY LAPAROTOMY;   Surgeon: Cherylynn RidgesJames O Wyatt, MD;  [facility];  Service: General;  Laterality: N/A;  . Appendectomy N/A 11/05/2013    Procedure: APPENDECTOMY;  Surgeon: Cherylynn RidgesJames O Wyatt, MD;  [facility];  Service: General;  Laterality: N/A;  . Bowel resection N/A 11/05/2013    Procedure: SMALL BOWEL RESECTION;  Surgeon: Cherylynn RidgesJames O Wyatt, MD;  [facility];  Service: General;  Laterality: N/A;    Current Outpatient Prescriptions  Medication Sig Dispense Refill  . aspirin 81 MG tablet Take 81 mg by mouth at bedtime.     . candesartan (ATACAND) 16 MG tablet Take 16 mg by mouth daily.    . cetirizine (ZYRTEC) 10 MG tablet Take 10 mg by mouth at bedtime.     Marland Kitchen. Fexofenadine-Pseudoephedrine (ALLEGRA-[name] PO) Take 1 tablet by mouth at bedtime as needed (allergies).    . metoprolol succinate (TOPROL-XL) 25 MG 24 hr tablet Take 1 tablet (25 mg total) by mouth every morning. 30 tablet 6  . MINIVELLE 0.05 MG/24HR patch Place 1 patch onto the skin See admin instructions. Takes usually mon and fri    . omeprazole (PRILOSEC) 20 MG capsule Take 20 mg by mouth daily.    . rosuvastatin (CRESTOR) 40 MG tablet Take 1 tablet (40 mg total) by mouth at bedtime. 90 tablet 3  . Timolol Maleate 0.5 % (DAILY) SOLN Place 1 drop into both eyes 2 (two) times daily.    . Travoprost,  BAK Free, (TRAVATAN) 0.004 % SOLN ophthalmic solution Place 1 drop into both eyes at bedtime.     No current facility-administered medications for this visit.    Allergies:    Allergies  Allergen Reactions  . Ivp Dye [Iodinated Diagnostic Agents] Hives and Shortness Of Breath  . Lisinopril Cough  . Sulfa Antibiotics Hives    Social History:  The patient  reports that she has never smoked. She does not have any smokeless tobacco history on file. She reports that she drinks alcohol. She reports that she does not use illicit drugs.   FHX: Sister had similar bowel issue.   ROS:  Please see the history of present illness.   Denies any syncope, bleeding,  orthopnea, PND   PHYSICAL EXAM: VS:  BP 114/60 mmHg  Pulse 52  Ht  (1.6 m)  Wt 190 lb (86.183 kg)  BMI 33.67 kg/m2 Well nourished, well developed, in no acute distress HEENT: normal Neck: no JVD Cardiac:  normal S1, S2; RRR; no murmur Lungs:  clear to auscultation bilaterally, no wheezing, rhonchi or rales Abd: soft, nontender, no hepatomegalyAbd scar Ext: no edema.  Skin: warm and dry Neuro: no focal abnormalities noted  EKG: EKG ordered today area 07/28/15-sinus bradycardia rate 42 with possible left atrial enlargement.  LDL-P - 1153 on 4/14 LDL count 51 on 07/01/14   ASSESSMENT AND PLAN:  1. Chest pain-no further episodes. Doing well. Continuing with aggressive primary prevention. 2. Hypertension-currently good control.  metoprolol. 3. Obesity-encourage weight loss. BMI currently 33. Unchanged . She is very motivated to lose weight however.  Discussed weight loss. Her husband has been a vegan. She went through quite a significant abdominal episode July 2015 as noted above. 4. Hyperlipidemia-previously saw [employer], [address][name] has worked with her in the past.  Currently on both Crestor. No longer on Niaspan. I am comfortable with this. We will continue with this regimen. Excellent control. She is going to have her blood work checked by Lovenia Kim next month.  Signed, Donato Schultz, MD [facility]  07/28/2015 10:08 AM

## 2016-03-15 ENCOUNTER — Other Ambulatory Visit: Payer: Self-pay | Admitting: Physician Assistant

## 2016-03-15 DIAGNOSIS — Z1231 Encounter for screening mammogram for malignant neoplasm of breast: Secondary | ICD-10-CM

## 2016-04-18 ENCOUNTER — Ambulatory Visit: Payer: 59

## 2016-04-28 ENCOUNTER — Ambulatory Visit: Payer: 59

## 2016-05-23 ENCOUNTER — Ambulatory Visit: Payer: 59

## 2016-06-27 DIAGNOSIS — R7303 Prediabetes: Secondary | ICD-10-CM | POA: Diagnosis not present

## 2016-06-27 DIAGNOSIS — E7800 Pure hypercholesterolemia, unspecified: Secondary | ICD-10-CM | POA: Diagnosis not present

## 2016-06-27 DIAGNOSIS — I10 Essential (primary) hypertension: Secondary | ICD-10-CM | POA: Diagnosis not present

## 2016-08-24 DIAGNOSIS — H401131 Primary open-angle glaucoma, bilateral, mild stage: Secondary | ICD-10-CM | POA: Diagnosis not present

## 2016-09-28 DIAGNOSIS — Z124 Encounter for screening for malignant neoplasm of cervix: Secondary | ICD-10-CM | POA: Diagnosis not present

## 2016-09-28 DIAGNOSIS — R232 Flushing: Secondary | ICD-10-CM | POA: Diagnosis not present

## 2016-09-28 DIAGNOSIS — Z01419 Encounter for gynecological examination (general) (routine) without abnormal findings: Secondary | ICD-10-CM | POA: Diagnosis not present

## 2017-01-17 DIAGNOSIS — R7303 Prediabetes: Secondary | ICD-10-CM | POA: Diagnosis not present

## 2017-01-17 DIAGNOSIS — Z23 Encounter for immunization: Secondary | ICD-10-CM | POA: Diagnosis not present

## 2017-01-17 DIAGNOSIS — I10 Essential (primary) hypertension: Secondary | ICD-10-CM | POA: Diagnosis not present

## 2017-01-23 DIAGNOSIS — L237 Allergic contact dermatitis due to plants, except food: Secondary | ICD-10-CM | POA: Diagnosis not present

## 2017-03-08 DIAGNOSIS — H401131 Primary open-angle glaucoma, bilateral, mild stage: Secondary | ICD-10-CM | POA: Diagnosis not present

## 2017-03-08 DIAGNOSIS — E119 Type 2 diabetes mellitus without complications: Secondary | ICD-10-CM | POA: Diagnosis not present

## 2017-05-04 DIAGNOSIS — I10 Essential (primary) hypertension: Secondary | ICD-10-CM | POA: Diagnosis not present

## 2017-05-04 DIAGNOSIS — E119 Type 2 diabetes mellitus without complications: Secondary | ICD-10-CM | POA: Diagnosis not present

## 2017-05-29 DIAGNOSIS — R1032 Left lower quadrant pain: Secondary | ICD-10-CM | POA: Diagnosis not present

## 2017-05-29 DIAGNOSIS — Z8719 Personal history of other diseases of the digestive system: Secondary | ICD-10-CM | POA: Diagnosis not present

## 2017-06-06 ENCOUNTER — Other Ambulatory Visit (HOSPITAL_COMMUNITY): Payer: Self-pay | Admitting: General Surgery

## 2017-06-06 ENCOUNTER — Other Ambulatory Visit: Payer: Self-pay | Admitting: General Surgery

## 2017-06-06 DIAGNOSIS — K5792 Diverticulitis of intestine, part unspecified, without perforation or abscess without bleeding: Secondary | ICD-10-CM

## 2017-06-06 DIAGNOSIS — R109 Unspecified abdominal pain: Secondary | ICD-10-CM

## 2017-06-07 ENCOUNTER — Ambulatory Visit (HOSPITAL_COMMUNITY)
Admission: RE | Admit: 2017-06-07 | Discharge: 2017-06-07 | Disposition: A | Discharge status: Home / Self Care | Payer: 59 | Source: Ambulatory Visit | Attending: General Surgery | Admitting: General Surgery

## 2017-06-07 DIAGNOSIS — K5730 Diverticulosis of large intestine without perforation or abscess without bleeding: Secondary | ICD-10-CM | POA: Insufficient documentation

## 2017-06-07 DIAGNOSIS — R109 Unspecified abdominal pain: Secondary | ICD-10-CM | POA: Insufficient documentation

## 2017-06-07 DIAGNOSIS — K5792 Diverticulitis of intestine, part unspecified, without perforation or abscess without bleeding: Secondary | ICD-10-CM

## 2017-06-07 DIAGNOSIS — I700 Atherosclerosis of aorta: Secondary | ICD-10-CM | POA: Insufficient documentation

## 2017-06-20 DIAGNOSIS — W19XXXA Unspecified fall, initial encounter: Secondary | ICD-10-CM | POA: Diagnosis not present

## 2017-06-20 DIAGNOSIS — M79641 Pain in right hand: Secondary | ICD-10-CM | POA: Diagnosis not present

## 2017-08-03 DIAGNOSIS — S63501A Unspecified sprain of right wrist, initial encounter: Secondary | ICD-10-CM | POA: Diagnosis not present

## 2017-08-08 DIAGNOSIS — M25531 Pain in right wrist: Secondary | ICD-10-CM | POA: Diagnosis not present

## 2017-08-10 DIAGNOSIS — M25531 Pain in right wrist: Secondary | ICD-10-CM | POA: Diagnosis not present

## 2017-09-06 DIAGNOSIS — H401131 Primary open-angle glaucoma, bilateral, mild stage: Secondary | ICD-10-CM | POA: Diagnosis not present

## 2017-09-27 DIAGNOSIS — Z1231 Encounter for screening mammogram for malignant neoplasm of breast: Secondary | ICD-10-CM | POA: Diagnosis not present

## 2017-09-27 DIAGNOSIS — Z01419 Encounter for gynecological examination (general) (routine) without abnormal findings: Secondary | ICD-10-CM | POA: Diagnosis not present

## 2017-09-27 DIAGNOSIS — R232 Flushing: Secondary | ICD-10-CM | POA: Diagnosis not present

## 2017-10-16 DIAGNOSIS — Z1231 Encounter for screening mammogram for malignant neoplasm of breast: Secondary | ICD-10-CM | POA: Diagnosis not present

## 2017-10-31 DIAGNOSIS — I10 Essential (primary) hypertension: Secondary | ICD-10-CM | POA: Diagnosis not present

## 2017-10-31 DIAGNOSIS — E119 Type 2 diabetes mellitus without complications: Secondary | ICD-10-CM | POA: Diagnosis not present

## 2018-04-02 DIAGNOSIS — R1032 Left lower quadrant pain: Secondary | ICD-10-CM | POA: Diagnosis not present

## 2018-04-03 ENCOUNTER — Ambulatory Visit
Admission: RE | Admit: 2018-04-03 | Discharge: 2018-04-03 | Disposition: A | Discharge status: Home / Self Care | Payer: 59 | Source: Ambulatory Visit | Attending: General Surgery | Admitting: General Surgery

## 2018-04-03 DIAGNOSIS — R109 Unspecified abdominal pain: Secondary | ICD-10-CM

## 2018-04-05 ENCOUNTER — Ambulatory Visit
Admission: RE | Admit: 2018-04-05 | Discharge: 2018-04-05 | Disposition: A | Discharge status: Home / Self Care | Payer: 59 | Source: Ambulatory Visit | Attending: General Surgery | Admitting: General Surgery

## 2018-04-05 MED ORDER — IOPAMIDOL (ISOVUE-300) INJECTION 61%
100.0000 mL | Freq: Once | INTRAVENOUS | Status: AC | PRN
  Administered 2018-04-05: 100 mL via INTRAVENOUS

## 2018-04-12 DIAGNOSIS — H401131 Primary open-angle glaucoma, bilateral, mild stage: Secondary | ICD-10-CM | POA: Diagnosis not present

## 2018-05-07 ENCOUNTER — Other Ambulatory Visit: Payer: Self-pay | Admitting: Family Medicine

## 2018-05-07 DIAGNOSIS — Z1231 Encounter for screening mammogram for malignant neoplasm of breast: Secondary | ICD-10-CM

## 2018-11-01 LAB — HEPATIC FUNCTION PANEL
ALT: 21 (ref 7–35)
AST: 24 (ref 13–35)
Bilirubin, Total: 0.4

## 2018-11-01 LAB — BASIC METABOLIC PANEL: Creatinine: 0.6 (ref 0.5–1.1)

## 2019-02-11 ENCOUNTER — Inpatient Hospital Stay (HOSPITAL_COMMUNITY)
Admission: EM | Admit: 2019-02-11 | Discharge: 2019-02-14 | DRG: 392 | Disposition: A | Discharge status: Home Health | Payer: 59 | Attending: Internal Medicine | Admitting: Internal Medicine

## 2019-02-11 ENCOUNTER — Other Ambulatory Visit: Payer: Self-pay

## 2019-02-11 ENCOUNTER — Emergency Department (HOSPITAL_COMMUNITY): Payer: 59

## 2019-02-11 DIAGNOSIS — K529 Noninfective gastroenteritis and colitis, unspecified: Principal | ICD-10-CM

## 2019-02-11 DIAGNOSIS — Z6834 Body mass index (BMI) 34.0-34.9, adult: Secondary | ICD-10-CM

## 2019-02-11 DIAGNOSIS — Z91041 Radiographic dye allergy status: Secondary | ICD-10-CM

## 2019-02-11 DIAGNOSIS — Z9049 Acquired absence of other specified parts of digestive tract: Secondary | ICD-10-CM

## 2019-02-11 DIAGNOSIS — I10 Essential (primary) hypertension: Secondary | ICD-10-CM

## 2019-02-11 DIAGNOSIS — E1169 Type 2 diabetes mellitus with other specified complication: Secondary | ICD-10-CM

## 2019-02-11 DIAGNOSIS — Z20828 Contact with and (suspected) exposure to other viral communicable diseases: Secondary | ICD-10-CM | POA: Diagnosis present

## 2019-02-11 DIAGNOSIS — E119 Type 2 diabetes mellitus without complications: Secondary | ICD-10-CM | POA: Diagnosis not present

## 2019-02-11 DIAGNOSIS — E669 Obesity, unspecified: Secondary | ICD-10-CM | POA: Diagnosis present

## 2019-02-11 DIAGNOSIS — Z809 Family history of malignant neoplasm, unspecified: Secondary | ICD-10-CM

## 2019-02-11 DIAGNOSIS — Z882 Allergy status to sulfonamides status: Secondary | ICD-10-CM

## 2019-02-11 DIAGNOSIS — K219 Gastro-esophageal reflux disease without esophagitis: Secondary | ICD-10-CM

## 2019-02-11 DIAGNOSIS — E6609 Other obesity due to excess calories: Secondary | ICD-10-CM

## 2019-02-11 DIAGNOSIS — Z6833 Body mass index (BMI) 33.0-33.9, adult: Secondary | ICD-10-CM

## 2019-02-11 DIAGNOSIS — Z8249 Family history of ischemic heart disease and other diseases of the circulatory system: Secondary | ICD-10-CM

## 2019-02-11 DIAGNOSIS — Z888 Allergy status to other drugs, medicaments and biological substances status: Secondary | ICD-10-CM

## 2019-02-11 DIAGNOSIS — E785 Hyperlipidemia, unspecified: Secondary | ICD-10-CM

## 2019-02-11 DIAGNOSIS — Z7982 Long term (current) use of aspirin: Secondary | ICD-10-CM

## 2019-02-11 LAB — CBC
HCT: 39.3 % (ref 36.0–46.0)
HCT: 36.4 % (ref 36.0–46.0)
Hemoglobin: 12.7 g/dL (ref 12.0–15.0)
Hemoglobin: 11.7 g/dL — ABNORMAL LOW (ref 12.0–15.0)
MCH: 26.6 pg (ref 26.0–34.0)
MCH: 26.5 pg (ref 26.0–34.0)
MCHC: 32.3 g/dL (ref 30.0–36.0)
MCHC: 32.1 g/dL (ref 30.0–36.0)
MCV: 82.4 fL (ref 80.0–100.0)
MCV: 82.5 fL (ref 80.0–100.0)
Platelets: 465 10*3/uL — ABNORMAL HIGH (ref 150–400)
Platelets: 424 10*3/uL — ABNORMAL HIGH (ref 150–400)
RBC: 4.77 MIL/uL (ref 3.87–5.11)
RBC: 4.41 MIL/uL (ref 3.87–5.11)
RDW: 15 % (ref 11.5–15.5)
RDW: 15.1 % (ref 11.5–15.5)
WBC: 15.3 10*3/uL — ABNORMAL HIGH (ref 4.0–10.5)
WBC: 14.7 10*3/uL — ABNORMAL HIGH (ref 4.0–10.5)
nRBC: 0 % (ref 0.0–0.2)
nRBC: 0 % (ref 0.0–0.2)

## 2019-02-11 LAB — CBG MONITORING, ED: Glucose-Capillary: 88 mg/dL (ref 70–99)

## 2019-02-11 LAB — URINALYSIS, ROUTINE W REFLEX MICROSCOPIC
Bilirubin Urine: NEGATIVE
Glucose, UA: NEGATIVE mg/dL
Hgb urine dipstick: NEGATIVE
Ketones, ur: NEGATIVE mg/dL
Nitrite: NEGATIVE
Protein, ur: NEGATIVE mg/dL
Specific Gravity, Urine: 1.016 (ref 1.005–1.030)
pH: 5 (ref 5.0–8.0)

## 2019-02-11 LAB — LIPID PANEL
Cholesterol: 103 mg/dL (ref 0–200)
HDL: 41 mg/dL (ref >40)
LDL Cholesterol: 46 mg/dL (ref 0–99)
Total CHOL/HDL Ratio: 2.5 RATIO
Triglycerides: 78 mg/dL (ref <150)
VLDL: 16 mg/dL (ref 0–40)

## 2019-02-11 LAB — CREATININE, SERUM
Creatinine, Ser: 0.7 mg/dL (ref 0.44–1.00)
GFR calc Af Amer: >60 mL/min (ref >60)
GFR calc non Af Amer: >60 mL/min (ref >60)

## 2019-02-11 LAB — COMPREHENSIVE METABOLIC PANEL
ALT: 17 U/L (ref 0–44)
AST: 16 U/L (ref 15–41)
Albumin: 3.6 g/dL (ref 3.5–5.0)
Alkaline Phosphatase: 57 U/L (ref 38–126)
Anion gap: 12 (ref 5–15)
BUN: 16 mg/dL (ref 8–23)
CO2: 22 mmol/L (ref 22–32)
Calcium: 9.5 mg/dL (ref 8.9–10.3)
Chloride: 98 mmol/L (ref 98–111)
Creatinine, Ser: 0.71 mg/dL (ref 0.44–1.00)
GFR calc Af Amer: >60 mL/min (ref >60)
GFR calc non Af Amer: >60 mL/min (ref >60)
Glucose, Bld: 120 mg/dL — ABNORMAL HIGH (ref 70–99)
Potassium: 3.6 mmol/L (ref 3.5–5.1)
Sodium: 132 mmol/L — ABNORMAL LOW (ref 135–145)
Total Bilirubin: 0.7 mg/dL (ref 0.3–1.2)
Total Protein: 7.4 g/dL (ref 6.5–8.1)

## 2019-02-11 LAB — LACTIC ACID, PLASMA
Lactic Acid, Venous: 1.1 mmol/L (ref 0.5–1.9)
Lactic Acid, Venous: 1 mmol/L (ref 0.5–1.9)

## 2019-02-11 LAB — SARS CORONAVIRUS 2 (TAT 6-24 HRS): SARS Coronavirus 2: NEGATIVE

## 2019-02-11 LAB — GLUCOSE, CAPILLARY: Glucose-Capillary: 88 mg/dL (ref 70–99)

## 2019-02-11 LAB — HEMOGLOBIN A1C
Hgb A1c MFr Bld: 6.8 % — ABNORMAL HIGH (ref 4.8–5.6)
Mean Plasma Glucose: 148.46 mg/dL

## 2019-02-11 LAB — LIPASE, BLOOD: Lipase: 24 U/L (ref 11–51)

## 2019-02-11 LAB — HIV ANTIBODY (ROUTINE TESTING W REFLEX): HIV Screen 4th Generation wRfx: NONREACTIVE

## 2019-02-11 MED ORDER — SODIUM CHLORIDE 0.9 % IV SOLN
INTRAVENOUS | Status: DC
  Administered 2019-02-11: 12:00:00 via INTRAVENOUS

## 2019-02-11 MED ORDER — MORPHINE SULFATE (PF) 2 MG/ML IV SOLN
2.0000 mg | INTRAVENOUS | Status: DC | PRN
  Administered 2019-02-12: 01:00:00 2 mg via INTRAVENOUS
  Filled 2019-02-11: qty 1

## 2019-02-11 MED ORDER — ACETAMINOPHEN 650 MG RE SUPP: 650.0000 mg | Freq: Four times a day (QID) | RECTAL | Status: DC | PRN

## 2019-02-11 MED ORDER — PANTOPRAZOLE SODIUM 40 MG PO TBEC: 40.0000 mg | DELAYED_RELEASE_TABLET | Freq: Every day | ORAL | Status: DC

## 2019-02-11 MED ORDER — ONDANSETRON HCL 4 MG/2ML IJ SOLN
4.0000 mg | Freq: Once | INTRAMUSCULAR | Status: AC
  Administered 2019-02-11: 12:00:00 4 mg via INTRAVENOUS
  Filled 2019-02-11: qty 2

## 2019-02-11 MED ORDER — ACETAMINOPHEN 325 MG PO TABS: 650.0000 mg | ORAL_TABLET | Freq: Four times a day (QID) | ORAL | Status: DC | PRN

## 2019-02-11 MED ORDER — PIPERACILLIN-TAZOBACTAM 3.375 G IVPB 30 MIN
3.3750 g | Freq: Three times a day (TID) | INTRAVENOUS | Status: DC
  Administered 2019-02-11 – 2019-02-14 (×8): 3.375 g via INTRAVENOUS
  Filled 2019-02-11 (×9): qty 50

## 2019-02-11 MED ORDER — SODIUM CHLORIDE 0.9 % IV SOLN
INTRAVENOUS | Status: DC
  Administered 2019-02-11 – 2019-02-12 (×2): via INTRAVENOUS

## 2019-02-11 MED ORDER — PIPERACILLIN-TAZOBACTAM 3.375 G IVPB 30 MIN
3.3750 g | Freq: Once | INTRAVENOUS | Status: AC
  Administered 2019-02-11: 16:00:00 3.375 g via INTRAVENOUS
  Filled 2019-02-11: qty 50

## 2019-02-11 MED ORDER — SODIUM CHLORIDE 0.9% FLUSH
3.0000 mL | Freq: Once | INTRAVENOUS | Status: AC
  Administered 2019-02-11: 17:00:00 3 mL via INTRAVENOUS

## 2019-02-11 MED ORDER — LABETALOL HCL 5 MG/ML IV SOLN: 10.0000 mg | Freq: Four times a day (QID) | INTRAVENOUS | Status: DC | PRN

## 2019-02-11 MED ORDER — INSULIN ASPART 100 UNIT/ML ~~LOC~~ SOLN: 0.0000 [IU] | Freq: Three times a day (TID) | SUBCUTANEOUS | Status: DC

## 2019-02-11 MED ORDER — ONDANSETRON HCL 4 MG/2ML IJ SOLN: 4.0000 mg | Freq: Four times a day (QID) | INTRAMUSCULAR | Status: DC | PRN

## 2019-02-11 MED ORDER — ENOXAPARIN SODIUM 40 MG/0.4ML ~~LOC~~ SOLN: 40.0000 mg | SUBCUTANEOUS | Status: DC

## 2019-02-11 MED ORDER — ONDANSETRON HCL 4 MG PO TABS: 4.0000 mg | ORAL_TABLET | Freq: Four times a day (QID) | ORAL | Status: DC | PRN

## 2019-02-11 MED ORDER — INSULIN ASPART 100 UNIT/ML ~~LOC~~ SOLN: 0.0000 [IU] | Freq: Every day | SUBCUTANEOUS | Status: DC

## 2019-02-11 NOTE — H&P (Signed)
History and Physical    Carol [name] QQV:[id number] DOB: [birth year] DOA: 02/11/2019  PCP: Corine Shelter, PA-C  Patient coming from: Home I have personally briefly reviewed patient's old medical records in [place]  Chief Complaint: Lower abdominal pain since 3 days.  HPI: Carol [name] is a 63 y.o. female with medical history significant of hypertension, hyperlipidemia, diabetes mellitus, perforated diverticulitis status post colectomy, GERD, morbid obesity presents to emergency department due to worsening lower abdominal pain since 3 days.  Reports pain is 10 out of 10, sometimes radiates to her back, no aggravating or relieving factors, denies association with recent travel, COVID-19 exposure, nausea, vomiting, diarrhea, constipation, decreased appetite, generalized weakness, lethargic, fever, chills, urinary symptoms such as dysuria, hematuria, increased or decreased urinary frequency, melena or hematemesis.  Had regular bowel movement this morning.  Patient reports that her surgeon gave her Cipro and Flagyl prescription and advised to get it refilled if she has any diverticulitis-like symptoms.  Patient reports that she filled her prescription on Friday and has been taking with no improvement.  She called her general surgeon office this morning who advised her to go to the emergency department for further evaluation and management.  Patient lives with her husband, denies smoking, alcohol, street drug use.    ED Course: Upon arrival: Vital signs stable, lipase: WNL, UA positive for leukocytes/bacteria.  CBC shows leukocytosis of 15.3.  CT abdomen/pelvis shows diffuse inflammation.  Patient received IV Zosyn in ED.  [facility] hospitalist consulted for admission.   Review of Systems: As per HPI otherwise negative.    Past Medical History:  Diagnosis Date   Chest discomfort    , Atypical   Gastroesophageal reflux disease    Hyperlipidemia    Hypertension    Systolic dysfunction     , Hyperdynamic    Past Surgical History:  Procedure Laterality Date   APPENDECTOMY N/A 11/05/2013   Procedure: APPENDECTOMY;  Surgeon: Gwenyth Ober, MD;  Location: [place];  Service: General;  Laterality: N/A;   BOWEL RESECTION N/A 11/05/2013   Procedure: SMALL BOWEL RESECTION;  Surgeon: Gwenyth Ober, MD;  Location: [place];  Service: General;  Laterality: N/A;   LAPAROTOMY N/A 11/05/2013   Procedure: EXPLORATORY LAPAROTOMY;  Surgeon: Gwenyth Ober, MD;  Location: [place];  Service: General;  Laterality: N/A;   TOTAL ABDOMINAL HYSTERECTOMY       reports that she has never smoked. She does not have any smokeless tobacco history on file. She reports current alcohol use. She reports that she does not use drugs.  Allergies  Allergen Reactions   Ivp Dye [Iodinated Diagnostic Agents] Hives and Shortness Of Breath    Did ok in OP setting with 13 hr Premedications    Lisinopril Cough   Sulfa Antibiotics Hives    Family History  Problem Relation Age of Onset   Heart disease Mother    Heart attack Mother    Heart disease Father    Cancer Father    Heart attack Brother     Prior to Admission medications   Medication Sig Start Date End Date Taking? Authorizing Provider  aspirin 81 MG tablet Take 81 mg by mouth at bedtime.    Yes [provider]  candesartan (ATACAND) 16 MG tablet Take 16 mg by mouth daily. 06/09/15  Yes [provider]  cetirizine (ZYRTEC) 10 MG tablet Take 10 mg by mouth at bedtime.    Yes [provider]  metFORMIN (GLUCOPHAGE-XR) 750 MG  24 hr tablet Take 750 mg by mouth daily with breakfast.   Yes [provider]  metoprolol succinate (TOPROL-XL) 25 MG 24 hr tablet Take 1 tablet (25 mg total) by mouth every morning. 12/10/14  Yes Jake Bathe, MD  omeprazole (PRILOSEC) 20 MG capsule Take 20 mg by mouth daily.   Yes [provider]  rosuvastatin (CRESTOR) 40 MG tablet Take 1 tablet (40 mg total) by mouth at bedtime.  06/15/15  Yes Jake Bathe, MD  Timolol Maleate 0.5 % (DAILY) SOLN Place 1 drop into both eyes 2 (two) times daily.   Yes [provider]  Travoprost, BAK Free, (TRAVATAN) 0.004 % SOLN ophthalmic solution Place 1 drop into both eyes at bedtime.   Yes [provider]  MINIVELLE 0.05 MG/24HR patch Place 1 patch onto the skin See admin instructions. Takes usually mon and fri 02/05/13   [provider]    Physical Exam: Vitals:   02/11/19 1245 02/11/19 1300 02/11/19 1315 02/11/19 1330  BP:  (!) 126/98 120/72 115/75  Pulse: 72 72 72 74  Resp: (!) Temp:      TempSrc:      SpO2: 96% 98% 96% 97%    Constitutional: NAD, calm, comfortable Eyes: PERRL, lids and conjunctivae normal ENMT: Mucous membranes are moist. Posterior pharynx clear of any exudate or lesions.Normal dentition.  Neck: normal, supple, no masses, no thyromegaly Respiratory: clear to auscultation bilaterally, no wheezing, no crackles. Normal respiratory effort. No accessory muscle use.  Cardiovascular: Regular rate and rhythm, no murmurs / rubs / gallops. No extremity edema. 2+ pedal pulses. No carotid bruits.  Abdomen: Lower abdominal tenderness positive, no guarding, no rigidity, no masses palpated. No hepatosplenomegaly. Bowel sounds positive.  Musculoskeletal: no clubbing / cyanosis. No joint deformity upper and lower extremities. Good ROM, no contractures. Normal muscle tone.  Skin: no rashes, lesions, ulcers. No induration Neurologic: CN 2-12 grossly intact. Sensation intact, DTR normal. Strength 5/5 in all 4.  Psychiatric: Normal judgment and insight. Alert and oriented x 3. Normal mood.    Labs on Admission: I have personally reviewed following labs and imaging studies  CBC: Recent Labs  Lab 02/11/19 1035  WBC 15.3*  HGB 12.7  HCT 39.3  MCV 82.4  PLT 465*   Basic Metabolic Panel: Recent Labs  Lab 02/11/19 1035  NA 132*  K 3.6  CL 98  CO2 22  GLUCOSE 120*  BUN 16    CREATININE 0.71  CALCIUM 9.5   GFR: CrCl cannot be calculated (Unknown ideal weight.). Liver Function Tests: Recent Labs  Lab 02/11/19 1035  AST 16  ALT 17  ALKPHOS 57  BILITOT 0.7  PROT 7.4  ALBUMIN 3.6   Recent Labs  Lab 02/11/19 1035  LIPASE 24   No results for input(s): AMMONIA in the last 168 hours. Coagulation Profile: No results for input(s): INR, PROTIME in the last 168 hours. Cardiac Enzymes: No results for input(s): CKTOTAL, CKMB, CKMBINDEX, TROPONINI in the last 168 hours. BNP (last 3 results) No results for input(s): PROBNP in the last 8760 hours. HbA1C: No results for input(s): HGBA1C in the last 72 hours. CBG: No results for input(s): GLUCAP in the last 168 hours. Lipid Profile: No results for input(s): CHOL, HDL, LDLCALC, TRIG, CHOLHDL, LDLDIRECT in the last 72 hours. Thyroid Function Tests: No results for input(s): TSH, T4TOTAL, FREET4, T3FREE, THYROIDAB in the last 72 hours. Anemia Panel: No results for input(s): VITAMINB12, FOLATE, FERRITIN, TIBC, IRON,  RETICCTPCT in the last 72 hours. Urine analysis:    Component Value Date/Time   COLORURINE YELLOW 02/11/2019 1100   APPEARANCEUR CLEAR 02/11/2019 1100   LABSPEC 1.016 02/11/2019 1100   PHURINE 5.0 02/11/2019 1100   GLUCOSEU NEGATIVE 02/11/2019 1100   HGBUR NEGATIVE 02/11/2019 1100   BILIRUBINUR NEGATIVE 02/11/2019 1100   KETONESUR NEGATIVE 02/11/2019 1100   PROTEINUR NEGATIVE 02/11/2019 1100   UROBILINOGEN 1.0 11/11/2013 1153   NITRITE NEGATIVE 02/11/2019 1100   LEUKOCYTESUR TRACE (A) 02/11/2019 1100    Radiological Exams on Admission: Ct Abdomen Pelvis Wo Contrast  Result Date: 02/11/2019 CLINICAL DATA:  63 year old with abdominal pain. Suspect diverticulitis. History of partial colectomy. EXAM: CT ABDOMEN AND PELVIS WITHOUT CONTRAST TECHNIQUE: Multidetector CT imaging of the abdomen and pelvis was performed following the standard protocol without IV contrast. COMPARISON:  04/05/2018  FINDINGS: Lower chest: Lung bases are clear. Hepatobiliary: High-density material in the gallbladder compatible with stones or sludge. Normal appearance of the liver. No significant biliary dilatation. No acute inflammatory changes involving the gallbladder. Pancreas: Unremarkable. No pancreatic ductal dilatation or surrounding inflammatory changes. Spleen: Normal in size without focal abnormality. Adrenals/Urinary Tract: Normal adrenal glands. Urinary bladder is decompressed. Inflammatory changes around the urinary bladder probably related to the nearby bowel inflammation. Normal appearance of both kidneys. No suspicious renal lesions. Stomach/Bowel: Inflammatory changes in the anterior lower abdomen and upper pelvis, best seen on sequence 3, image 71. There is asymmetric wall thickening involving the sigmoid colon on image 72. Few colonic diverticula involving the sigmoid colon. No evidence for free air or bowel perforation. Negative for an abscess. Contrast is distal to the focal colonic wall thickening. Significant wall thickening involving a loop of small bowel in the anterior lower abdomen near the colonic wall thickening on sequence 3, image 68. Mesenteric edema in the lower abdomen near the colonic and small bowel wall thickening. Normal appearance of stomach and duodenum. Vascular/Lymphatic: Atherosclerotic calcifications in the abdominal aorta without aneurysm. No abdominopelvic lymphadenopathy. Reproductive: Status post hysterectomy. No adnexal masses. Other: No free fluid in the abdomen or pelvis. Irregularity along the anterior abdominal wall is probably associated with postoperative changes and small ventral hernias. Musculoskeletal: Mild disc space narrowing at L2-L3. IMPRESSION: 1. Inflammation in the lower abdomen and upper pelvis. Inflammation involving at least one small bowel loop and asymmetric wall thickening involving a small segment of the sigmoid colon. The nidus for the inflammation is  uncertain and could be related to the colon or small bowel. No evidence for bowel perforation or abscess. No evidence for a bowel obstruction. Recommend follow-up imaging to ensure resolution of the wall thickening and exclude an underlying neoplastic process. 2. High-density material in the gallbladder is most likely related to cholelithiasis. Electronically Signed   By: Richarda OverlieAdam  Henn M.D.   On: 02/11/2019 15:00    Assessment/Plan Active Problems:   Gastroesophageal reflux disease   Hyperlipidemia   Hypertension   Colitis   Obesity   DM (diabetes mellitus) (HCC)   Hx of colectomy   Colitis/enteritis: -CT abdomen/pelvis result as above. -History of perforated diverticulitis s/p colectomy.  Failed outpatient antibiotic therapy. -We will admit patient under observation. -Keep her n.p.o.  Received IV Zosyn in ED- will continue same. -Start on IV fluids, Zofran as needed for nausea and vomiting and morphine as needed for severe pain.  Lipase: WNL.  Patient is afebrile, leukocytosis of 15.3 -We will check lactic acid level.  Hypertension: Blood pressure is stable -We will hold p.o. blood pressure  medicine at this time as patient is n.p.o. -Labetalol as needed. -Monitor blood pressure closely.  Diabetes mellitus: Check A1c -Hold metformin for now and start on sliding scale insulin and monitor blood sugar closely.  Hyperlipidemia: Hold statin for now.  GERD: Continue Protonix  DVT prophylaxis: TED/SCD/Lovenox Code Status: Full code  family Communication: Patient sister present at bedside.  Plan of care discussed with patient and her sister in length and both verbalized understanding and agreed with it. Disposition Plan: TBD Consults called: None Admission status: Observation  Ollen Bowl MD [facility] Pager [phone]  If 7PM-7AM, please contact night-coverage [email or web address] Password [facility]  02/11/2019, 4:13 PM

## 2019-02-11 NOTE — ED Provider Notes (Signed)
[facility] EMERGENCY DEPARTMENT Provider Note   CSN: [id number] Arrival date & time: 02/11/19  1006     History   Chief Complaint Chief Complaint  Patient presents with   Abdominal Pain   Diverticulitis    HPI Carol [name] is a 63 y.o. female.     HPI Patient with history of diverticulitis, partial colectomy now presents with lower abdominal pain, fever, nausea, anorexia. Onset was 4 days ago.  Since onset she has had no relief with ibuprofen, and pain has become persistent.  The pain is diffuse or severe across the lower abdomen. No diarrhea, no urinary complaints. Patient was well prior to onset of illness Patient also has pill in pocket, previously prescribed for exacerbations of presumed diverticulitis. She notes that in spite of taking these antibiotics in addition to the ibuprofen, she has had no change in her condition. Past Medical History:  Diagnosis Date   Chest discomfort    , Atypical   Gastroesophageal reflux disease    Hyperlipidemia    Hypertension    Systolic dysfunction    , Hyperdynamic    Patient Active Problem List   Diagnosis Date Noted   Postop check 11/29/2013   Diverticulitis large intestine 10/31/2013   Chest discomfort    Systolic dysfunction    Gastroesophageal reflux disease    Hyperlipidemia    Hypertension     Past Surgical History:  Procedure Laterality Date   APPENDECTOMY N/A 11/05/2013   Procedure: APPENDECTOMY;  Surgeon: Gwenyth Ober, MD;  Location: [place];  Service: General;  Laterality: N/A;   BOWEL RESECTION N/A 11/05/2013   Procedure: SMALL BOWEL RESECTION;  Surgeon: Gwenyth Ober, MD;  Location: [place];  Service: General;  Laterality: N/A;   LAPAROTOMY N/A 11/05/2013   Procedure: EXPLORATORY LAPAROTOMY;  Surgeon: Gwenyth Ober, MD;  Location: [place];  Service: General;  Laterality: N/A;   TOTAL ABDOMINAL HYSTERECTOMY       OB History   No obstetric history on file.      Home  Medications    Prior to Admission medications   Medication Sig Start Date End Date Taking? Authorizing Provider  aspirin 81 MG tablet Take 81 mg by mouth at bedtime.     [provider]  candesartan (ATACAND) 16 MG tablet Take 16 mg by mouth daily. 06/09/15   [provider]  cetirizine (ZYRTEC) 10 MG tablet Take 10 mg by mouth at bedtime.     [provider]  Fexofenadine-Pseudoephedrine (ALLEGRA-D PO) Take 1 tablet by mouth at bedtime as needed (allergies).    [provider]  metoprolol succinate (TOPROL-XL) 25 MG 24 hr tablet Take 1 tablet (25 mg total) by mouth every morning. 12/10/14   Jerline Pain, MD  MINIVELLE 0.05 MG/24HR patch Place 1 patch onto the skin See admin instructions. Takes usually mon and fri 02/05/13   [provider]  omeprazole (PRILOSEC) 20 MG capsule Take 20 mg by mouth daily.    [provider]  rosuvastatin (CRESTOR) 40 MG tablet Take 1 tablet (40 mg total) by mouth at bedtime. 06/15/15   Jerline Pain, MD  Timolol Maleate 0.5 % (DAILY) SOLN Place 1 drop into both eyes 2 (two) times daily.    [provider]  Travoprost, BAK Free, (TRAVATAN) 0.004 % SOLN ophthalmic solution Place 1 drop into both eyes at bedtime.    [provider]    Family History Family History  Problem Relation Age  of Onset   Heart disease Mother    Heart attack Mother    Heart disease Father    Cancer Father    Heart attack Brother     Social History Social History   Tobacco Use   Smoking status: Never Smoker  Substance Use Topics   Alcohol use: Yes    Comment: Occassional drinker   Drug use: No     Allergies   Ivp dye [iodinated diagnostic agents], Lisinopril, and Sulfa antibiotics   Review of Systems Review of Systems  Constitutional:       Per HPI, otherwise negative  HENT:       Per HPI, otherwise negative  Respiratory:       Per HPI, otherwise negative  Cardiovascular:       Per HPI,  otherwise negative  Gastrointestinal: Positive for abdominal pain and nausea. Negative for vomiting.  Endocrine:       Negative aside from HPI  Genitourinary:       Neg aside from HPI   Musculoskeletal:       Per HPI, otherwise negative  Skin: Negative.   Neurological: Negative for syncope.     Physical Exam Updated Vital Signs BP 115/75    Pulse 74    Temp 98.3 F (36.8 C) (Oral)    Resp 19    SpO2 97%   Physical Exam Vitals signs and nursing note reviewed.  Constitutional:      General: She is not in acute distress.    Appearance: She is well-developed.  HENT:     Head: Normocephalic and atraumatic.  Eyes:     Conjunctiva/sclera: Conjunctivae normal.  Cardiovascular:     Rate and Rhythm: Normal rate and regular rhythm.  Pulmonary:     Effort: Pulmonary effort is normal. No respiratory distress.     Breath sounds: Normal breath sounds. No stridor.  Abdominal:     General: There is no distension.     Tenderness: There is abdominal tenderness in the right lower quadrant, suprapubic area and left lower quadrant.  Skin:    General: Skin is warm and dry.  Neurological:     Mental Status: She is alert and oriented to person, place, and time.     Cranial Nerves: No cranial nerve deficit.      ED Treatments / Results  Labs (all labs ordered are listed, but only abnormal results are displayed) Labs Reviewed  COMPREHENSIVE METABOLIC PANEL - Abnormal; Notable for the following components:      Result Value   Sodium 132 (*)    Glucose, Bld 120 (*)    All other components within normal limits  CBC - Abnormal; Notable for the following components:   WBC 15.3 (*)    Platelets 465 (*)    All other components within normal limits  URINALYSIS, ROUTINE W REFLEX MICROSCOPIC - Abnormal; Notable for the following components:   Leukocytes,Ua TRACE (*)    Bacteria, UA RARE (*)    All other components within normal limits  SARS CORONAVIRUS 2 (TAT 6-24 HRS)  LIPASE, BLOOD     EKG None  Radiology Ct Abdomen Pelvis Wo Contrast  Result Date: 02/11/2019 CLINICAL DATA:  63 year old with abdominal pain. Suspect diverticulitis. History of partial colectomy. EXAM: CT ABDOMEN AND PELVIS WITHOUT CONTRAST TECHNIQUE: Multidetector CT imaging of the abdomen and pelvis was performed following the standard protocol without IV contrast. COMPARISON:  04/05/2018 FINDINGS: Lower chest: Lung bases are clear. Hepatobiliary: High-density material in the gallbladder compatible  with stones or sludge. Normal appearance of the liver. No significant biliary dilatation. No acute inflammatory changes involving the gallbladder. Pancreas: Unremarkable. No pancreatic ductal dilatation or surrounding inflammatory changes. Spleen: Normal in size without focal abnormality. Adrenals/Urinary Tract: Normal adrenal glands. Urinary bladder is decompressed. Inflammatory changes around the urinary bladder probably related to the nearby bowel inflammation. Normal appearance of both kidneys. No suspicious renal lesions. Stomach/Bowel: Inflammatory changes in the anterior lower abdomen and upper pelvis, best seen on sequence 3, image 71. There is asymmetric wall thickening involving the sigmoid colon on image 72. Few colonic diverticula involving the sigmoid colon. No evidence for free air or bowel perforation. Negative for an abscess. Contrast is distal to the focal colonic wall thickening. Significant wall thickening involving a loop of small bowel in the anterior lower abdomen near the colonic wall thickening on sequence 3, image 68. Mesenteric edema in the lower abdomen near the colonic and small bowel wall thickening. Normal appearance of stomach and duodenum. Vascular/Lymphatic: Atherosclerotic calcifications in the abdominal aorta without aneurysm. No abdominopelvic lymphadenopathy. Reproductive: Status post hysterectomy. No adnexal masses. Other: No free fluid in the abdomen or pelvis. Irregularity along the  anterior abdominal wall is probably associated with postoperative changes and small ventral hernias. Musculoskeletal: Mild disc space narrowing at L2-L3. IMPRESSION: 1. Inflammation in the lower abdomen and upper pelvis. Inflammation involving at least one small bowel loop and asymmetric wall thickening involving a small segment of the sigmoid colon. The nidus for the inflammation is uncertain and could be related to the colon or small bowel. No evidence for bowel perforation or abscess. No evidence for a bowel obstruction. Recommend follow-up imaging to ensure resolution of the wall thickening and exclude an underlying neoplastic process. 2. High-density material in the gallbladder is most likely related to cholelithiasis. Electronically Signed   By: Richarda Overlie M.D.   On: 02/11/2019 15:00    Procedures Procedures (including critical care time)  Medications Ordered in ED Medications  sodium chloride flush (NS) 0.9 % injection 3 mL (has no administration in time range)  0.9 %  sodium chloride infusion ( Intravenous New Bag/Given 02/11/19 1200)  piperacillin-tazobactam (ZOSYN) IVPB 3.375 g (has no administration in time range)  ondansetron (ZOFRAN) injection 4 mg (4 mg Intravenous Given 02/11/19 1200)     Initial Impression / Assessment and Plan / ED Course  I have reviewed the triage vital signs and the nursing notes.  Pertinent labs & imaging results that were available during my care of the patient were reviewed by me and considered in my medical decision making (see chart for details).        3:41 PM Patient and her companion are aware of all findings.  We lengthy conversation about findings concerning for colitis, though without evidence for abscess or obstruction. However, given the patient's history of prior partial colectomy, new evidence for colitis, and absence of improvement in spite of ciprofloxacin, Flagyl therapy which is typically appropriate for outpatient therapy, patient will  require initiation of IV antibiotics, admission for further monitoring, management.  Final Clinical Impressions(s) / ED Diagnoses   Final diagnoses:  Colitis     Gerhard Munch, MD 02/11/19 1542

## 2019-02-11 NOTE — ED Triage Notes (Signed)
Hx of diverticulitis with surgery 2015 with bowel resection-- having low abd pain for 3 days-- started taking meds for diverticulitis from another episode. No diarrhea- + severe pain.

## 2019-02-12 DIAGNOSIS — Z7982 Long term (current) use of aspirin: Secondary | ICD-10-CM | POA: Diagnosis not present

## 2019-02-12 DIAGNOSIS — E119 Type 2 diabetes mellitus without complications: Secondary | ICD-10-CM | POA: Diagnosis present

## 2019-02-12 DIAGNOSIS — Z809 Family history of malignant neoplasm, unspecified: Secondary | ICD-10-CM | POA: Diagnosis not present

## 2019-02-12 DIAGNOSIS — K529 Noninfective gastroenteritis and colitis, unspecified: Secondary | ICD-10-CM | POA: Diagnosis present

## 2019-02-12 DIAGNOSIS — Z20828 Contact with and (suspected) exposure to other viral communicable diseases: Secondary | ICD-10-CM | POA: Diagnosis present

## 2019-02-12 DIAGNOSIS — Z8249 Family history of ischemic heart disease and other diseases of the circulatory system: Secondary | ICD-10-CM | POA: Diagnosis not present

## 2019-02-12 DIAGNOSIS — Z6833 Body mass index (BMI) 33.0-33.9, adult: Secondary | ICD-10-CM | POA: Diagnosis not present

## 2019-02-12 DIAGNOSIS — Z91041 Radiographic dye allergy status: Secondary | ICD-10-CM | POA: Diagnosis not present

## 2019-02-12 DIAGNOSIS — Z882 Allergy status to sulfonamides status: Secondary | ICD-10-CM | POA: Diagnosis not present

## 2019-02-12 DIAGNOSIS — E785 Hyperlipidemia, unspecified: Secondary | ICD-10-CM | POA: Diagnosis present

## 2019-02-12 DIAGNOSIS — Z9049 Acquired absence of other specified parts of digestive tract: Secondary | ICD-10-CM | POA: Diagnosis not present

## 2019-02-12 DIAGNOSIS — K219 Gastro-esophageal reflux disease without esophagitis: Secondary | ICD-10-CM | POA: Diagnosis present

## 2019-02-12 DIAGNOSIS — I10 Essential (primary) hypertension: Secondary | ICD-10-CM | POA: Diagnosis present

## 2019-02-12 DIAGNOSIS — Z888 Allergy status to other drugs, medicaments and biological substances status: Secondary | ICD-10-CM | POA: Diagnosis not present

## 2019-02-12 LAB — BASIC METABOLIC PANEL
Anion gap: 10 (ref 5–15)
BUN: 13 mg/dL (ref 8–23)
CO2: 22 mmol/L (ref 22–32)
Calcium: 8.8 mg/dL — ABNORMAL LOW (ref 8.9–10.3)
Chloride: 102 mmol/L (ref 98–111)
Creatinine, Ser: 0.73 mg/dL (ref 0.44–1.00)
GFR calc Af Amer: >60 mL/min (ref >60)
GFR calc non Af Amer: >60 mL/min (ref >60)
Glucose, Bld: 97 mg/dL (ref 70–99)
Potassium: 3.3 mmol/L — ABNORMAL LOW (ref 3.5–5.1)
Sodium: 134 mmol/L — ABNORMAL LOW (ref 135–145)

## 2019-02-12 LAB — GLUCOSE, CAPILLARY
Glucose-Capillary: 103 mg/dL — ABNORMAL HIGH (ref 70–99)
Glucose-Capillary: 92 mg/dL (ref 70–99)
Glucose-Capillary: 81 mg/dL (ref 70–99)
Glucose-Capillary: 82 mg/dL (ref 70–99)
Glucose-Capillary: 102 mg/dL — ABNORMAL HIGH (ref 70–99)

## 2019-02-12 LAB — CBC
HCT: 34.9 % — ABNORMAL LOW (ref 36.0–46.0)
Hemoglobin: 11.1 g/dL — ABNORMAL LOW (ref 12.0–15.0)
MCH: 26.4 pg (ref 26.0–34.0)
MCHC: 31.8 g/dL (ref 30.0–36.0)
MCV: 82.9 fL (ref 80.0–100.0)
Platelets: 404 10*3/uL — ABNORMAL HIGH (ref 150–400)
RBC: 4.21 MIL/uL (ref 3.87–5.11)
RDW: 15.1 % (ref 11.5–15.5)
WBC: 11.8 10*3/uL — ABNORMAL HIGH (ref 4.0–10.5)
nRBC: 0 % (ref 0.0–0.2)

## 2019-02-12 MED ORDER — METOPROLOL SUCCINATE ER 25 MG PO TB24
25.0000 mg | ORAL_TABLET | Freq: Every morning | ORAL | Status: DC
  Administered 2019-02-12 – 2019-02-14 (×3): 25 mg via ORAL
  Filled 2019-02-12 (×3): qty 1

## 2019-02-12 MED ORDER — TIMOLOL MALEATE 0.5 % OP SOLN
1.0000 [drp] | Freq: Two times a day (BID) | OPHTHALMIC | Status: DC
  Administered 2019-02-12 – 2019-02-13 (×4): 1 [drp] via OPHTHALMIC
  Filled 2019-02-12: qty 5

## 2019-02-12 MED ORDER — ROSUVASTATIN CALCIUM 20 MG PO TABS
40.0000 mg | ORAL_TABLET | Freq: Every day | ORAL | Status: DC
  Administered 2019-02-12 – 2019-02-13 (×2): 40 mg via ORAL
  Filled 2019-02-12 (×2): qty 2

## 2019-02-12 MED ORDER — PANTOPRAZOLE SODIUM 40 MG IV SOLR
40.0000 mg | INTRAVENOUS | Status: DC
  Administered 2019-02-12 – 2019-02-13 (×2): 40 mg via INTRAVENOUS
  Filled 2019-02-12 (×2): qty 40

## 2019-02-12 MED ORDER — ASPIRIN EC 81 MG PO TBEC
81.0000 mg | DELAYED_RELEASE_TABLET | Freq: Every day | ORAL | Status: DC
  Administered 2019-02-12 – 2019-02-13 (×2): 81 mg via ORAL
  Filled 2019-02-12 (×2): qty 1

## 2019-02-12 NOTE — Progress Notes (Addendum)
PROGRESS NOTE    Carol [name]  DUK:[id number] DOB: 02/21/1956 DOA: 02/11/2019 PCP: Corine Shelter, PA-C   Brief Narrative:  Carol [name] is a 63 y.o. female with medical history significant of hypertension, hyperlipidemia, diabetes mellitus, perforated diverticulitis status post colectomy, GERD, morbid obesity presented to emergency department due to worsening lower abdominal pain since 3 days, located at lower abdomen, 10 out of 10 with no aggravating or relieving factor.    No nausea, vomiting or diarrhea.  She called her surgeon  Patient reports that her surgeon gave her Cipro and Flagyl prescription and advised to get it refilled if she has any diverticulitis-like symptoms.  Patient reports that she filled her prescription on Friday and has been taking with no improvement.  She called her general surgeon office this morning who advised her to go to the emergency department for further evaluation and management.  Upon arrival to ED, she was hemodynamically stable with mild leukocytosis of 15.3 CT abdomen pelvis showed enteritis and colitis.  She received Zosyn in the ED which was continued and she was hospitalized under [facility].   Assessment & Plan:   Active Problems:   Gastroesophageal reflux disease   Hyperlipidemia   Hypertension   Colitis   Obesity   DM (diabetes mellitus) ([place])   Hx of colectomy  Acute enterocolitis: Feels slightly better.  Still with 4/10 abdominal pain, mostly located in lower abdomen.  No more nausea.  We will start her on clears and advance as tolerated.  Failed outpatient oral therapy.  Continue IV Zosyn.  Essential hypertension: Blood pressure controlled.  Resume beta-blocker.  Monitor.   Diabetes mellitus: Blood sugar controlled.  Continue SSI.    Hyperlipidemia: Resume statin.  GERD: Continue Protonix  DVT prophylaxis: Lovenox Code Status: Full code Family Communication: Sister present at bedside.  Plan of care discussed with patient and her family  member.  Answered several questions. Disposition Plan: Potential discharge tomorrow.  Estimated body mass index is 33.66 kg/m as calculated from the following:   Height as of 07/28/15: 5\' 3"  (1.6 m).   Weight as of 07/28/15: 86.2 kg.      Nutritional status:               Consultants:   None  Procedures:   None  Antimicrobials:   Zosyn   Subjective: Seen and examined.  Still with abdominal pain but improving.  Sister at bedside.  No other complaint.  Objective: Vitals:   02/11/19 2245 02/12/19 0604 02/12/19 0807 02/12/19 1252  BP: (!) 127/54 123/64 (!) 131/55 (!) 126/54  Pulse: 76 76 74 78  Resp: 16 17 18 18   Temp: 98.4 F (36.9 C) 98.4 F (36.9 C) 98.9 F (37.2 C) 98 F (36.7 C)  TempSrc: Oral Oral Oral Oral  SpO2: 95% 96% 92% 96%    Intake/Output Summary (Last 24 hours) at 02/12/2019 1320 Last data filed at 02/12/2019 1253 Gross per 24 hour  Intake 2034.38 ml  Output -  Net 2034.38 ml   There were no vitals filed for this visit.  Examination:  General exam: Appears calm and comfortable  Respiratory system: Clear to auscultation. Respiratory effort normal. Cardiovascular system: S1 & S2 heard, RRR. No JVD, murmurs, rubs, gallops or clicks. No pedal edema. Gastrointestinal system: Abdomen is nondistended, soft and suprapubic, left lower quadrant and right lower quadrant tenderness. No organomegaly or masses felt. Normal bowel sounds heard. Central nervous system: Alert and oriented. No focal neurological deficits. Extremities: Symmetric 5 x  5 power. Skin: No rashes, lesions or ulcers Psychiatry: Judgement and insight appear normal. Mood & affect appropriate.    Data Reviewed: I have personally reviewed following labs and imaging studies  CBC: Recent Labs  Lab 02/11/19 1035 02/11/19 1640 02/12/19 0515  WBC 15.3* 14.7* 11.8*  HGB 12.7 11.7* 11.1*  HCT 39.3 36.4 34.9*  MCV 82.4 82.5 82.9  PLT 465* 424* 404*   Basic Metabolic Panel:  Recent Labs  Lab 02/11/19 1035 02/11/19 1640 02/12/19 0515  NA 132*  --  134*  K 3.6  --  3.3*  CL 98  --  102  CO2 22  --  22  GLUCOSE 120*  --  97  BUN 16  --  13  CREATININE 0.71 0.70 0.73  CALCIUM 9.5  --  8.8*   GFR: CrCl cannot be calculated (Unknown ideal weight.). Liver Function Tests: Recent Labs  Lab 02/11/19 1035  AST 16  ALT 17  ALKPHOS 57  BILITOT 0.7  PROT 7.4  ALBUMIN 3.6   Recent Labs  Lab 02/11/19 1035  LIPASE 24   No results for input(s): AMMONIA in the last 168 hours. Coagulation Profile: No results for input(s): INR, PROTIME in the last 168 hours. Cardiac Enzymes: No results for input(s): CKTOTAL, CKMB, CKMBINDEX, TROPONINI in the last 168 hours. BNP (last 3 results) No results for input(s): PROBNP in the last 8760 hours. HbA1C: Recent Labs    02/11/19 1640  HGBA1C 6.8*   CBG: Recent Labs  Lab 02/11/19 1706 02/11/19 2126 02/12/19 0327 02/12/19 0809 02/12/19 1149  GLUCAP 88 88 103* 92 81   Lipid Profile: Recent Labs    02/11/19 1640  CHOL 103  HDL 41  LDLCALC 46  TRIG 78  CHOLHDL 2.5   Thyroid Function Tests: No results for input(s): TSH, T4TOTAL, FREET4, T3FREE, THYROIDAB in the last 72 hours. Anemia Panel: No results for input(s): VITAMINB12, FOLATE, FERRITIN, TIBC, IRON, RETICCTPCT in the last 72 hours. Sepsis Labs: Recent Labs  Lab 02/11/19 1700 02/11/19 1910  LATICACIDVEN 1.1 1.0    Recent Results (from the past 240 hour(s))  SARS CORONAVIRUS 2 (TAT 6-24 HRS) Nasopharyngeal Nasopharyngeal Swab     Status: None   Collection Time: 02/11/19  3:42 PM   Specimen: Nasopharyngeal Swab  Result Value Ref Range Status   SARS Coronavirus 2 NEGATIVE NEGATIVE Final    Comment: (NOTE) SARS-CoV-2 target nucleic acids are NOT DETECTED. The SARS-CoV-2 RNA is generally detectable in upper and lower respiratory specimens during the acute phase of infection. Negative results do not preclude SARS-CoV-2 infection, do not rule  out co-infections with other pathogens, and should not be used as the sole basis for treatment or other patient management decisions. Negative results must be combined with clinical observations, patient history, and epidemiological information. The expected result is Negative. Fact Sheet for Patients: HairSlick.no Fact Sheet for Healthcare Providers: quierodirigir.com This test is not yet approved or cleared by the [place] FDA and  has been authorized for detection and/or diagnosis of SARS-CoV-2 by FDA under an Emergency Use Authorization (EUA). This EUA will remain  in effect (meaning this test can be used) for the duration of the COVID-19 declaration under Section 56 4(b)(1) of the Act, 21 U.S.C. section 360bbb-3(b)(1), unless the authorization is terminated or revoked sooner. Performed at [facility], [address], [place], [place] [address]       Radiology Studies: Ct Abdomen Pelvis Wo Contrast  Result Date: 02/11/2019 CLINICAL DATA:  63 year old with abdominal pain. Suspect diverticulitis. History of partial colectomy. EXAM: CT ABDOMEN AND PELVIS WITHOUT CONTRAST TECHNIQUE: Multidetector CT imaging of the abdomen and pelvis was performed following the standard protocol without IV contrast. COMPARISON:  04/05/2018 FINDINGS: Lower chest: Lung bases are clear. Hepatobiliary: High-density material in the gallbladder compatible with stones or sludge. Normal appearance of the liver. No significant biliary dilatation. No acute inflammatory changes involving the gallbladder. Pancreas: Unremarkable. No pancreatic ductal dilatation or surrounding inflammatory changes. Spleen: Normal in size without focal abnormality. Adrenals/Urinary Tract: Normal adrenal glands. Urinary bladder is decompressed. Inflammatory changes around the urinary bladder probably related to the nearby bowel inflammation. Normal appearance of both kidneys.  No suspicious renal lesions. Stomach/Bowel: Inflammatory changes in the anterior lower abdomen and upper pelvis, best seen on sequence 3, image 71. There is asymmetric wall thickening involving the sigmoid colon on image 72. Few colonic diverticula involving the sigmoid colon. No evidence for free air or bowel perforation. Negative for an abscess. Contrast is distal to the focal colonic wall thickening. Significant wall thickening involving a loop of small bowel in the anterior lower abdomen near the colonic wall thickening on sequence 3, image 68. Mesenteric edema in the lower abdomen near the colonic and small bowel wall thickening. Normal appearance of stomach and duodenum. Vascular/Lymphatic: Atherosclerotic calcifications in the abdominal aorta without aneurysm. No abdominopelvic lymphadenopathy. Reproductive: Status post hysterectomy. No adnexal masses. Other: No free fluid in the abdomen or pelvis. Irregularity along the anterior abdominal wall is probably associated with postoperative changes and small ventral hernias. Musculoskeletal: Mild disc space narrowing at L2-L3. IMPRESSION: 1. Inflammation in the lower abdomen and upper pelvis. Inflammation involving at least one small bowel loop and asymmetric wall thickening involving a small segment of the sigmoid colon. The nidus for the inflammation is uncertain and could be related to the colon or small bowel. No evidence for bowel perforation or abscess. No evidence for a bowel obstruction. Recommend follow-up imaging to ensure resolution of the wall thickening and exclude an underlying neoplastic process. 2. High-density material in the gallbladder is most likely related to cholelithiasis. Electronically Signed   By: Richarda OverlieAdam  Henn M.D.   On: 02/11/2019 15:00    Scheduled Meds: . enoxaparin (LOVENOX) injection  40 mg Subcutaneous Q24H  . insulin aspart  0-15 Units Subcutaneous TID WC  . insulin aspart  0-5 Units Subcutaneous QHS  . pantoprazole (PROTONIX)  IV  40 mg Intravenous Q24H   Continuous Infusions: . sodium chloride Stopped (02/11/19 1702)  . sodium chloride 100 mL/hr at 02/11/19 1702  . piperacillin-tazobactam 3.375 g (02/12/19 1301)     LOS: 0 days   Time spent: 30 minutes   Carol Clossavi Zeppelin Commisso, MD [facility]  02/12/2019, 1:20 PM   To contact the attending provider between 7A-7P or the covering provider during after hours 7P-7A, please log into the web site [email or web address] and use password [email or web address].

## 2019-02-12 NOTE — Plan of Care (Signed)

## 2019-02-13 LAB — CBC WITH DIFFERENTIAL/PLATELET
Abs Immature Granulocytes: 0.04 10*3/uL (ref 0.00–0.07)
Basophils Absolute: 0.1 10*3/uL (ref 0.0–0.1)
Basophils Relative: 1 %
Eosinophils Absolute: 0.4 10*3/uL (ref 0.0–0.5)
Eosinophils Relative: 4 %
HCT: 35.4 % — ABNORMAL LOW (ref 36.0–46.0)
Hemoglobin: 10.9 g/dL — ABNORMAL LOW (ref 12.0–15.0)
Immature Granulocytes: 0 %
Lymphocytes Relative: 30 %
Lymphs Abs: 2.8 10*3/uL (ref 0.7–4.0)
MCH: 26 pg (ref 26.0–34.0)
MCHC: 30.8 g/dL (ref 30.0–36.0)
MCV: 84.5 fL (ref 80.0–100.0)
Monocytes Absolute: 0.9 10*3/uL (ref 0.1–1.0)
Monocytes Relative: 9 %
Neutro Abs: 5.2 10*3/uL (ref 1.7–7.7)
Neutrophils Relative %: 56 %
Platelets: 402 10*3/uL — ABNORMAL HIGH (ref 150–400)
RBC: 4.19 MIL/uL (ref 3.87–5.11)
RDW: 15 % (ref 11.5–15.5)
WBC: 9.3 10*3/uL (ref 4.0–10.5)
nRBC: 0 % (ref 0.0–0.2)

## 2019-02-13 LAB — BASIC METABOLIC PANEL
Anion gap: 9 (ref 5–15)
BUN: 11 mg/dL (ref 8–23)
CO2: 24 mmol/L (ref 22–32)
Calcium: 8.8 mg/dL — ABNORMAL LOW (ref 8.9–10.3)
Chloride: 105 mmol/L (ref 98–111)
Creatinine, Ser: 0.74 mg/dL (ref 0.44–1.00)
GFR calc Af Amer: >60 mL/min (ref >60)
GFR calc non Af Amer: >60 mL/min (ref >60)
Glucose, Bld: 99 mg/dL (ref 70–99)
Potassium: 3.6 mmol/L (ref 3.5–5.1)
Sodium: 138 mmol/L (ref 135–145)

## 2019-02-13 LAB — GLUCOSE, CAPILLARY
Glucose-Capillary: 89 mg/dL (ref 70–99)
Glucose-Capillary: 107 mg/dL — ABNORMAL HIGH (ref 70–99)
Glucose-Capillary: 96 mg/dL (ref 70–99)
Glucose-Capillary: 76 mg/dL (ref 70–99)

## 2019-02-13 MED ORDER — ENOXAPARIN SODIUM 40 MG/0.4ML ~~LOC~~ SOLN
40.0000 mg | SUBCUTANEOUS | Status: DC
  Administered 2019-02-13: 40 mg via SUBCUTANEOUS
  Filled 2019-02-13: qty 0.4

## 2019-02-13 MED ORDER — PANTOPRAZOLE SODIUM 40 MG PO TBEC
40.0000 mg | DELAYED_RELEASE_TABLET | Freq: Every day | ORAL | Status: DC
  Administered 2019-02-14: 11:00:00 40 mg via ORAL
  Filled 2019-02-13: qty 1

## 2019-02-13 NOTE — Progress Notes (Addendum)
PROGRESS NOTE        PATIENT DETAILS Name: Carol [name] Age: 63 y.o. Sex: female Date of Birth: 01-Sep-1955 Admit Date: 02/11/2019 Admitting Physician Ollen Bowl, MD HTD:SKAJGO, Loraine Leriche, PA-C  Brief Narrative: Patient is a 63 y.o. female with history of HTN, dyslipidemia, DM-2,-prior history of perforated diverticulitis s/p colectomy, morbid obesity-presenting with severe lower abdominal pain-found to have enterocolitis on imaging studies.  Started Cipro/Flagyl in the outpatient setting-but continued to have worsening pain and hence came to the emergency room where she was started on empiric antimicrobial therapy with IV Zosyn and other supportive care and subsequently admitted to the hospitalist service.  See below for further details.  Subjective: Much better-but still having episodes of colicky abdominal pain at times (reluctant to take narcotics)-no nausea vomiting or diarrhea.  Assessment/Plan: Enterocolitis: Improved-abdominal exam is benign-although improved-still having some episodes of severe colicky abdominal pain-cautiously continue with soft diet-continue Zosyn.  We will monitor for 1 additional day on IV antibiotics due to these episodes of colicky pain-if clinical improvement continues-we will plan on discharge on 11/5.  If pain worsens, we will downgrade diet.  DM-2: CBG stable-continue with SSI.  We will plan on resuming oral hypoglycemic agents on discharge  HTN: Controlled-continue with metoprolol  Dyslipidemia: Continue with Crestor  Obesity: Estimated body mass index is 33.66 kg/m as calculated from the following:   Height as of 07/28/15: 5\' 3"  (1.6 m).   Weight as of 07/28/15: 86.2 kg.    Diet: Diet Order            DIET SOFT Room service appropriate? Yes; Fluid consistency: Thin  Diet effective now               DVT Prophylaxis: Prophylactic Lovenox   Code Status: Full code   Family Communication: None at  bedside  Disposition Plan: Remain inpatient  Antimicrobial agents: Anti-infectives (From admission, onward)   Start     Dose/Rate Route Frequency Ordered Stop   02/11/19 2130  piperacillin-tazobactam (ZOSYN) IVPB 3.375 g     3.375 g 12.5 mL/hr over 4 Hours Intravenous Every 8 hours 02/11/19 1719     02/11/19 1545  piperacillin-tazobactam (ZOSYN) IVPB 3.375 g     3.375 g 100 mL/hr over 30 Minutes Intravenous  Once 02/11/19 1534 02/11/19 1702      Procedures: None  CONSULTS:  None  Time spent: 25- minutes-Greater than 50% of this time was spent in counseling, explanation of diagnosis, planning of further management, and coordination of care.  MEDICATIONS: Scheduled Meds:  aspirin EC  81 mg Oral QHS   insulin aspart  0-15 Units Subcutaneous TID WC   insulin aspart  0-5 Units Subcutaneous QHS   metoprolol succinate  25 mg Oral q morning - 10a   [START ON 02/14/2019] pantoprazole  40 mg Oral Daily   rosuvastatin  40 mg Oral QHS   timolol  1 drop Both Eyes BID   Continuous Infusions:  sodium chloride 10 mL/hr at 02/13/19 0914   piperacillin-tazobactam 3.375 g (02/13/19 0616)   PRN Meds:.acetaminophen **OR** acetaminophen, morphine injection, ondansetron **OR** ondansetron (ZOFRAN) IV   PHYSICAL EXAM: Vital signs: Vitals:   02/12/19 1252 02/12/19 1600 02/12/19 2359 02/13/19 0456  BP: (!) 126/54 120/61 119/81 (!) 124/54  Pulse: 78 60 61 62  Resp: 18 18 18 18   Temp: 98 F (36.7  C) 99.1 F (37.3 C) 98 F (36.7 C) 98 F (36.7 C)  TempSrc: Oral Oral Oral Oral  SpO2: 96% 96% 96% 95%   There were no vitals filed for this visit. There is no height or weight on file to calculate BMI.   Gen Exam:Alert awake-not in any distress HEENT:atraumatic, normocephalic Chest: B/L clear to auscultation anteriorly CVS:S1S2 regular Abdomen:soft-mildly tender in the upper abdominal area without any peritoneal signs. Extremities:no edema Neurology: Non focal Skin: no  rash  I have personally reviewed following labs and imaging studies  LABORATORY DATA: CBC: Recent Labs  Lab 02/11/19 1035 02/11/19 1640 02/12/19 0515 02/13/19 0449  WBC 15.3* 14.7* 11.8* 9.3  NEUTROABS  --   --   --  5.2  HGB 12.7 11.7* 11.1* 10.9*  HCT 39.3 36.4 34.9* 35.4*  MCV 82.4 82.5 82.9 84.5  PLT 465* 424* 404* 402*    Basic Metabolic Panel: Recent Labs  Lab 02/11/19 1035 02/11/19 1640 02/12/19 0515 02/13/19 0449  NA 132*  --  134* 138  K 3.6  --  3.3* 3.6  CL 98  --  102 105  CO2 22  --  22 24  GLUCOSE 120*  --  97 99  BUN 16  --  13 11  CREATININE 0.71 0.70 0.73 0.74  CALCIUM 9.5  --  8.8* 8.8*    GFR: CrCl cannot be calculated (Unknown ideal weight.).  Liver Function Tests: Recent Labs  Lab 02/11/19 1035  AST 16  ALT 17  ALKPHOS 57  BILITOT 0.7  PROT 7.4  ALBUMIN 3.6   Recent Labs  Lab 02/11/19 1035  LIPASE 24   No results for input(s): AMMONIA in the last 168 hours.  Coagulation Profile: No results for input(s): INR, PROTIME in the last 168 hours.  Cardiac Enzymes: No results for input(s): CKTOTAL, CKMB, CKMBINDEX, TROPONINI in the last 168 hours.  BNP (last 3 results) No results for input(s): PROBNP in the last 8760 hours.  HbA1C: Recent Labs    02/11/19 1640  HGBA1C 6.8*    CBG: Recent Labs  Lab 02/12/19 0809 02/12/19 1149 02/12/19 1617 02/12/19 2059 02/13/19 0503  GLUCAP 92 81 82 102* 89    Lipid Profile: Recent Labs    02/11/19 1640  CHOL 103  HDL 41  LDLCALC 46  TRIG 78  CHOLHDL 2.5    Thyroid Function Tests: No results for input(s): TSH, T4TOTAL, FREET4, T3FREE, THYROIDAB in the last 72 hours.  Anemia Panel: No results for input(s): VITAMINB12, FOLATE, FERRITIN, TIBC, IRON, RETICCTPCT in the last 72 hours.  Urine analysis:    Component Value Date/Time   COLORURINE YELLOW 02/11/2019 1100   APPEARANCEUR CLEAR 02/11/2019 1100   LABSPEC 1.016 02/11/2019 1100   PHURINE 5.0 02/11/2019 1100    GLUCOSEU NEGATIVE 02/11/2019 1100   HGBUR NEGATIVE 02/11/2019 1100   BILIRUBINUR NEGATIVE 02/11/2019 1100   KETONESUR NEGATIVE 02/11/2019 1100   PROTEINUR NEGATIVE 02/11/2019 1100   UROBILINOGEN 1.0 11/11/2013 1153   NITRITE NEGATIVE 02/11/2019 1100   LEUKOCYTESUR TRACE (A) 02/11/2019 1100    Sepsis Labs: Lactic Acid, Venous    Component Value Date/Time   LATICACIDVEN 1.0 02/11/2019 1910    MICROBIOLOGY: Recent Results (from the past 240 hour(s))  SARS CORONAVIRUS 2 (TAT 6-24 HRS) Nasopharyngeal Nasopharyngeal Swab     Status: None   Collection Time: 02/11/19  3:42 PM   Specimen: Nasopharyngeal Swab  Result Value Ref Range Status   SARS Coronavirus 2 NEGATIVE NEGATIVE Final  Comment: (NOTE) SARS-CoV-2 target nucleic acids are NOT DETECTED. The SARS-CoV-2 RNA is generally detectable in upper and lower respiratory specimens during the acute phase of infection. Negative results do not preclude SARS-CoV-2 infection, do not rule out co-infections with other pathogens, and should not be used as the sole basis for treatment or other patient management decisions. Negative results must be combined with clinical observations, patient history, and epidemiological information. The expected result is Negative. Fact Sheet for Patients: SugarRoll.be Fact Sheet for Healthcare Providers: https://www.woods-mathews.com/ This test is not yet approved or cleared by the [place] FDA and  has been authorized for detection and/or diagnosis of SARS-CoV-2 by FDA under an Emergency Use Authorization (EUA). This EUA will remain  in effect (meaning this test can be used) for the duration of the COVID-19 declaration under Section 56 4(b)(1) of the Act, 21 U.S.C. section 360bbb-3(b)(1), unless the authorization is terminated or revoked sooner. Performed at [facility], [place] [address], [place], [place] [address]     RADIOLOGY  STUDIES/RESULTS: Ct Abdomen Pelvis Wo Contrast  Result Date: 02/11/2019 CLINICAL DATA:  63 year old with abdominal pain. Suspect diverticulitis. History of partial colectomy. EXAM: CT ABDOMEN AND PELVIS WITHOUT CONTRAST TECHNIQUE: Multidetector CT imaging of the abdomen and pelvis was performed following the standard protocol without IV contrast. COMPARISON:  04/05/2018 FINDINGS: Lower chest: Lung bases are clear. Hepatobiliary: High-density material in the gallbladder compatible with stones or sludge. Normal appearance of the liver. No significant biliary dilatation. No acute inflammatory changes involving the gallbladder. Pancreas: Unremarkable. No pancreatic ductal dilatation or surrounding inflammatory changes. Spleen: Normal in size without focal abnormality. Adrenals/Urinary Tract: Normal adrenal glands. Urinary bladder is decompressed. Inflammatory changes around the urinary bladder probably related to the nearby bowel inflammation. Normal appearance of both kidneys. No suspicious renal lesions. Stomach/Bowel: Inflammatory changes in the anterior lower abdomen and upper pelvis, best seen on sequence 3, image 71. There is asymmetric wall thickening involving the sigmoid colon on image 72. Few colonic diverticula involving the sigmoid colon. No evidence for free air or bowel perforation. Negative for an abscess. Contrast is distal to the focal colonic wall thickening. Significant wall thickening involving a loop of small bowel in the anterior lower abdomen near the colonic wall thickening on sequence 3, image 68. Mesenteric edema in the lower abdomen near the colonic and small bowel wall thickening. Normal appearance of stomach and duodenum. Vascular/Lymphatic: Atherosclerotic calcifications in the abdominal aorta without aneurysm. No abdominopelvic lymphadenopathy. Reproductive: Status post hysterectomy. No adnexal masses. Other: No free fluid in the abdomen or pelvis. Irregularity along the anterior  abdominal wall is probably associated with postoperative changes and small ventral hernias. Musculoskeletal: Mild disc space narrowing at L2-L3. IMPRESSION: 1. Inflammation in the lower abdomen and upper pelvis. Inflammation involving at least one small bowel loop and asymmetric wall thickening involving a small segment of the sigmoid colon. The nidus for the inflammation is uncertain and could be related to the colon or small bowel. No evidence for bowel perforation or abscess. No evidence for a bowel obstruction. Recommend follow-up imaging to ensure resolution of the wall thickening and exclude an underlying neoplastic process. 2. High-density material in the gallbladder is most likely related to cholelithiasis. Electronically Signed   By: Markus Daft M.D.   On: 02/11/2019 15:00     LOS: 1 day   Oren Binet, MD  [facility]  If 7PM-7AM, please contact night-coverage  Please page via [email or web address]  Go to [email or web address] and use [place]'s  universal password to access. If you do not have the password, please contact the hospital operator.  Locate the [facility] provider you are looking for under [facility] and page to a number that you can be directly reached. If you still have difficulty reaching the provider, please page the [facility] (Director on Call) for the Hospitalists listed on amion for assistance.  02/13/2019, 11:34 AM

## 2019-02-14 LAB — GLUCOSE, CAPILLARY: Glucose-Capillary: 100 mg/dL — ABNORMAL HIGH (ref 70–99)

## 2019-02-14 MED ORDER — AMOXICILLIN-POT CLAVULANATE 875-125 MG PO TABS: 1.0000 | ORAL_TABLET | Freq: Two times a day (BID) | ORAL | 0 refills | Status: DC

## 2019-02-14 MED ORDER — AMOXICILLIN-POT CLAVULANATE 875-125 MG PO TABS: 1.0000 | ORAL_TABLET | Freq: Two times a day (BID) | ORAL | 0 refills | Status: AC

## 2019-02-14 NOTE — Discharge Summary (Addendum)
PATIENT DETAILS Name: Carol SnareJill D Lofstrom Age: [age] y.o. Sex: female Date of Birth: 1955/11/17 MRN: [id number]. Admitting Physician: Ollen Bowlinka R Pahwani, MD NWG:[id number]:Hepler, Loraine LericheMark, PA-C  Admit Date: 02/11/2019 Discharge date: 02/14/2019  Recommendations for Outpatient Follow-up:  1. Follow up with PCP in 1-2 weeks 2. Please obtain BMP/CBC in one week   Admitted From:  Home  Disposition: SNF   Home Health: Yes  Equipment/Devices: None  Discharge Condition: Stable  CODE STATUS: FULL CODE  Diet recommendation:  Diet Order            Diet - low sodium heart healthy        DIET SOFT Room service appropriate? Yes; Fluid consistency: Thin  Diet effective now               Brief Summary: See H&P, Labs, Consult and Test reports for all details in brief,Patient is a 63 y.o. female with history of HTN, dyslipidemia, DM-2,-prior history of perforated diverticulitis s/p colectomy, morbid obesity-presenting with severe lower abdominal pain-found to have enterocolitis on imaging studies.  She was started on Cipro/Flagyl in the outpatient setting-but continued to have worsening pain and hence came to the emergency room where she was started on empiric antimicrobial therapy with IV Zosyn and other supportive care and subsequently admitted to the hospitalist service.  See below for further details.  Brief Hospital Course: Enterocolitis: Much Improved-abdominal exam is benign-hardly any pain since yesterday. No nausea, vomiting or diarrhea. Transition from Zosyn to Augmentin on discharge. Abdomen very soft and non tender on exam.  DM-2: CBG stable-continue with SSI.  We will plan on resuming oral hypoglycemic agents on discharge  HTN: Controlled-continue with metoprolol  Dyslipidemia: Continue with Crestor  Obesity: Estimated body mass index is 33.66 kg/m as calculated from the following:   Height as of 07/28/15: 5\' 3"  (1.6 m).   Weight as of 07/28/15: 86.2 kg.    Procedures/Studies: None  Discharge Diagnoses:  Active Problems:   Gastroesophageal reflux disease   Hyperlipidemia   Hypertension   Colitis   Obesity   DM (diabetes mellitus) (HCC)   Hx of colectomy  Discharge Instructions:  Activity:  As tolerated  Discharge Instructions    Call MD for:  persistant nausea and vomiting   Complete by: As directed    Call MD for:  redness, tenderness, or signs of infection (pain, swelling, redness, odor or green/yellow discharge around incision site)   Complete by: As directed    Call MD for:  severe uncontrolled pain   Complete by: As directed    Diet - low sodium heart healthy   Complete by: As directed    Discharge instructions   Complete by: As directed    Follow with Primary MD  Lovenia KimHepler, Mark, PA-C in 1-2 weeks  Please get a complete blood count and chemistry panel checked by your Primary MD at your next visit, and again as instructed by your Primary MD.  Get Medicines reviewed and adjusted: Please take all your medications with you for your next visit with your Primary MD  Laboratory/radiological data: Please request your Primary MD to go over all hospital tests and procedure/radiological results at the follow up, please ask your Primary MD to get all Hospital records sent to his/her office.  In some cases, they will be blood work, cultures and biopsy results pending at the time of your discharge. Please request that your primary care M.D. follows up on these results.  Also Note the following: If you experience  worsening of your admission symptoms, develop shortness of breath, life threatening emergency, suicidal or homicidal thoughts you must seek medical attention immediately by calling 911 or calling your MD immediately  if symptoms less severe.  You must read complete instructions/literature along with all the possible adverse reactions/side effects for all the Medicines you take and that have been prescribed to you. Take any  new Medicines after you have completely understood and accpet all the possible adverse reactions/side effects.   Do not drive when taking Pain medications or sleeping medications (Benzodaizepines)  Do not take more than prescribed Pain, Sleep and Anxiety Medications. It is not advisable to combine anxiety,sleep and pain medications without talking with your primary care practitioner  Special Instructions: If you have smoked or chewed Tobacco  in the last 2 yrs please stop smoking, stop any regular Alcohol  and or any Recreational drug use.  Wear Seat belts while driving.  Please note: You were cared for by a hospitalist during your hospital stay. Once you are discharged, your primary care physician will handle any further medical issues. Please note that NO REFILLS for any discharge medications will be authorized once you are discharged, as it is imperative that you return to your primary care physician (or establish a relationship with a primary care physician if you do not have one) for your post hospital discharge needs so that they can reassess your need for medications and monitor your lab values.   Increase activity slowly   Complete by: As directed      Allergies as of 02/14/2019      Reactions   Ivp Dye [iodinated Diagnostic Agents] Hives, Shortness Of Breath   Did ok in OP setting with 13 hr Premedications    Lisinopril Cough   Sulfa Antibiotics Hives      Medication List    TAKE these medications   amoxicillin-clavulanate 875-125 MG tablet Commonly known as: Augmentin Take 1 tablet by mouth 2 (two) times daily for 5 days.   aspirin 81 MG tablet Take 81 mg by mouth at bedtime.   candesartan 16 MG tablet Commonly known as: ATACAND Take 16 mg by mouth daily.   cetirizine 10 MG tablet Commonly known as: ZYRTEC Take 10 mg by mouth at bedtime.   metFORMIN 750 MG 24 hr tablet Commonly known as: GLUCOPHAGE-XR Take 750 mg by mouth daily with breakfast.   metoprolol  succinate 25 MG 24 hr tablet Commonly known as: TOPROL-XL Take 1 tablet (25 mg total) by mouth every morning.   omeprazole 20 MG capsule Commonly known as: PRILOSEC Take 20 mg by mouth daily.   rosuvastatin 40 MG tablet Commonly known as: CRESTOR Take 1 tablet (40 mg total) by mouth at bedtime.   Timolol Maleate 0.5 % (DAILY) Soln Place 1 drop into both eyes 2 (two) times daily.      Follow-up Information    Lovenia Kim, PA-C. Schedule an appointment as soon as possible for a visit in 1 week(s).   Specialty: Physician Assistant Contact information: [address] [place] [address] [phone]          Allergies  Allergen Reactions   Ivp Dye [Iodinated Diagnostic Agents] Hives and Shortness Of Breath    Did ok in OP setting with 13 hr Premedications    Lisinopril Cough   Sulfa Antibiotics Hives      Consultations:  None   Other Procedures/Studies: Ct Abdomen Pelvis Wo Contrast  Result Date: 02/11/2019 CLINICAL DATA:  63 year old with abdominal pain. Suspect diverticulitis. History of partial colectomy. EXAM: CT ABDOMEN AND PELVIS WITHOUT CONTRAST TECHNIQUE: Multidetector CT imaging of the abdomen and pelvis was performed following the standard protocol without IV contrast. COMPARISON:  04/05/2018 FINDINGS: Lower chest: Lung bases are clear. Hepatobiliary: High-density material in the gallbladder compatible with stones or sludge. Normal appearance of the liver. No significant biliary dilatation. No acute inflammatory changes involving the gallbladder. Pancreas: Unremarkable. No pancreatic ductal dilatation or surrounding inflammatory changes. Spleen: Normal in size without focal abnormality. Adrenals/Urinary Tract: Normal adrenal glands. Urinary bladder is decompressed. Inflammatory changes around the urinary bladder probably related to the nearby bowel inflammation. Normal appearance of both kidneys. No suspicious renal lesions. Stomach/Bowel:  Inflammatory changes in the anterior lower abdomen and upper pelvis, best seen on sequence 3, image 71. There is asymmetric wall thickening involving the sigmoid colon on image 72. Few colonic diverticula involving the sigmoid colon. No evidence for free air or bowel perforation. Negative for an abscess. Contrast is distal to the focal colonic wall thickening. Significant wall thickening involving a loop of small bowel in the anterior lower abdomen near the colonic wall thickening on sequence 3, image 68. Mesenteric edema in the lower abdomen near the colonic and small bowel wall thickening. Normal appearance of stomach and duodenum. Vascular/Lymphatic: Atherosclerotic calcifications in the abdominal aorta without aneurysm. No abdominopelvic lymphadenopathy. Reproductive: Status post hysterectomy. No adnexal masses. Other: No free fluid in the abdomen or pelvis. Irregularity along the anterior abdominal wall is probably associated with postoperative changes and small ventral hernias. Musculoskeletal: Mild disc space narrowing at L2-L3. IMPRESSION: 1. Inflammation in the lower abdomen and upper pelvis. Inflammation involving at least one small bowel loop and asymmetric wall thickening involving a small segment of the sigmoid colon. The nidus for the inflammation is uncertain and could be related to the colon or small bowel. No evidence for bowel perforation or abscess. No evidence for a bowel obstruction. Recommend follow-up imaging to ensure resolution of the wall thickening and exclude an underlying neoplastic process. 2. High-density material in the gallbladder is most likely related to cholelithiasis. Electronically Signed   By: Richarda Overlie M.D.   On: 02/11/2019 15:00     TODAY-DAY OF DISCHARGE:  Subjective:   Jashae Wiggs today has no headache,no chest abdominal pain,no new weakness tingling or numbness, feels much better wants to go home today.   Objective:   Blood pressure 118/82, pulse (!) 53,  temperature 97.9 F (36.6 C), temperature source Oral, resp. rate 16, SpO2 98 %. No intake or output data in the 24 hours ending 02/14/19 0913 There were no vitals filed for this visit.  Exam: Awake Alert, Oriented *3, No new F.N deficits, Normal affect [place].AT,PERRAL Supple Neck,No JVD, No cervical lymphadenopathy appriciated.  Symmetrical Chest wall movement, Good air movement bilaterally, CTAB RRR,No Gallops,Rubs or new Murmurs, No Parasternal Heave +ve B.Sounds, Abd Soft, Non tender, No organomegaly appriciated, No rebound -guarding or rigidity. No Cyanosis, Clubbing or edema, No new Rash or bruise   PERTINENT RADIOLOGIC STUDIES: Ct Abdomen Pelvis Wo Contrast  Result Date: 02/11/2019 CLINICAL DATA:  64 year old with abdominal pain. Suspect diverticulitis. History of partial colectomy. EXAM: CT ABDOMEN AND PELVIS WITHOUT CONTRAST TECHNIQUE: Multidetector CT imaging of the abdomen and pelvis was performed following the standard protocol without IV contrast. COMPARISON:  04/05/2018 FINDINGS: Lower chest: Lung bases are clear. Hepatobiliary: High-density material in the gallbladder compatible with stones or sludge. Normal appearance of the liver. No significant biliary dilatation. No  acute inflammatory changes involving the gallbladder. Pancreas: Unremarkable. No pancreatic ductal dilatation or surrounding inflammatory changes. Spleen: Normal in size without focal abnormality. Adrenals/Urinary Tract: Normal adrenal glands. Urinary bladder is decompressed. Inflammatory changes around the urinary bladder probably related to the nearby bowel inflammation. Normal appearance of both kidneys. No suspicious renal lesions. Stomach/Bowel: Inflammatory changes in the anterior lower abdomen and upper pelvis, best seen on sequence 3, image 71. There is asymmetric wall thickening involving the sigmoid colon on image 72. Few colonic diverticula involving the sigmoid colon. No evidence for free air or bowel  perforation. Negative for an abscess. Contrast is distal to the focal colonic wall thickening. Significant wall thickening involving a loop of small bowel in the anterior lower abdomen near the colonic wall thickening on sequence 3, image 68. Mesenteric edema in the lower abdomen near the colonic and small bowel wall thickening. Normal appearance of stomach and duodenum. Vascular/Lymphatic: Atherosclerotic calcifications in the abdominal aorta without aneurysm. No abdominopelvic lymphadenopathy. Reproductive: Status post hysterectomy. No adnexal masses. Other: No free fluid in the abdomen or pelvis. Irregularity along the anterior abdominal wall is probably associated with postoperative changes and small ventral hernias. Musculoskeletal: Mild disc space narrowing at L2-L3. IMPRESSION: 1. Inflammation in the lower abdomen and upper pelvis. Inflammation involving at least one small bowel loop and asymmetric wall thickening involving a small segment of the sigmoid colon. The nidus for the inflammation is uncertain and could be related to the colon or small bowel. No evidence for bowel perforation or abscess. No evidence for a bowel obstruction. Recommend follow-up imaging to ensure resolution of the wall thickening and exclude an underlying neoplastic process. 2. High-density material in the gallbladder is most likely related to cholelithiasis. Electronically Signed   By: Richarda Overlie M.D.   On: 02/11/2019 15:00     PERTINENT LAB RESULTS: CBC: Recent Labs    02/12/19 0515 02/13/19 0449  WBC 11.8* 9.3  HGB 11.1* 10.9*  HCT 34.9* 35.4*  PLT 404* 402*   CMET CMP     Component Value Date/Time   NA 138 02/13/2019 0449   K 3.6 02/13/2019 0449   CL 105 02/13/2019 0449   CO2 24 02/13/2019 0449   GLUCOSE 99 02/13/2019 0449   BUN 11 02/13/2019 0449   CREATININE 0.74 02/13/2019 0449   CALCIUM 8.8 (L) 02/13/2019 0449   PROT 7.4 02/11/2019 1035   ALBUMIN 3.6 02/11/2019 1035   AST 16 02/11/2019 1035   ALT  17 02/11/2019 1035   ALKPHOS 57 02/11/2019 1035   BILITOT 0.7 02/11/2019 1035   GFRNONAA >60 02/13/2019 0449   GFRAA >60 02/13/2019 0449    GFR CrCl cannot be calculated (Unknown ideal weight.). Recent Labs    02/11/19 1035  LIPASE 24   No results for input(s): CKTOTAL, CKMB, CKMBINDEX, TROPONINI in the last 72 hours. Invalid input(s): POCBNP No results for input(s): DDIMER in the last 72 hours. Recent Labs    02/11/19 1640  HGBA1C 6.8*   Recent Labs    02/11/19 1640  CHOL 103  HDL 41  LDLCALC 46  TRIG 78  CHOLHDL 2.5   No results for input(s): TSH, T4TOTAL, T3FREE, THYROIDAB in the last 72 hours.  Invalid input(s): FREET3 No results for input(s): VITAMINB12, FOLATE, FERRITIN, TIBC, IRON, RETICCTPCT in the last 72 hours. Coags: No results for input(s): INR in the last 72 hours.  Invalid input(s): PT Microbiology: Recent Results (from the past 240 hour(s))  SARS CORONAVIRUS 2 (TAT 6-24 HRS) Nasopharyngeal  Nasopharyngeal Swab     Status: None   Collection Time: 02/11/19  3:42 PM   Specimen: Nasopharyngeal Swab  Result Value Ref Range Status   SARS Coronavirus 2 NEGATIVE NEGATIVE Final    Comment: (NOTE) SARS-CoV-2 target nucleic acids are NOT DETECTED. The SARS-CoV-2 RNA is generally detectable in upper and lower respiratory specimens during the acute phase of infection. Negative results do not preclude SARS-CoV-2 infection, do not rule out co-infections with other pathogens, and should not be used as the sole basis for treatment or other patient management decisions. Negative results must be combined with clinical observations, patient history, and epidemiological information. The expected result is Negative. Fact Sheet for Patients: SugarRoll.be Fact Sheet for Healthcare Providers: https://www.woods-mathews.com/ This test is not yet approved or cleared by the [place] FDA and  has been authorized for detection  and/or diagnosis of SARS-CoV-2 by FDA under an Emergency Use Authorization (EUA). This EUA will remain  in effect (meaning this test can be used) for the duration of the COVID-19 declaration under Section 56 4(b)(1) of the Act, 21 U.S.C. section 360bbb-3(b)(1), unless the authorization is terminated or revoked sooner. Performed at [facility], [address], [place], [place] [address]     FURTHER DISCHARGE INSTRUCTIONS:  Get Medicines reviewed and adjusted: Please take all your medications with you for your next visit with your Primary MD  Laboratory/radiological data: Please request your Primary MD to go over all hospital tests and procedure/radiological results at the follow up, please ask your Primary MD to get all Hospital records sent to his/her office.  In some cases, they will be blood work, cultures and biopsy results pending at the time of your discharge. Please request that your primary care M.D. goes through all the records of your hospital data and follows up on these results.  Also Note the following: If you experience worsening of your admission symptoms, develop shortness of breath, life threatening emergency, suicidal or homicidal thoughts you must seek medical attention immediately by calling 911 or calling your MD immediately  if symptoms less severe.  You must read complete instructions/literature along with all the possible adverse reactions/side effects for all the Medicines you take and that have been prescribed to you. Take any new Medicines after you have completely understood and accpet all the possible adverse reactions/side effects.   Do not drive when taking Pain medications or sleeping medications (Benzodaizepines)  Do not take more than prescribed Pain, Sleep and Anxiety Medications. It is not advisable to combine anxiety,sleep and pain medications without talking with your primary care practitioner  Special Instructions: If you have smoked or  chewed Tobacco  in the last 2 yrs please stop smoking, stop any regular Alcohol  and or any Recreational drug use.  Wear Seat belts while driving.  Please note: You were cared for by a hospitalist during your hospital stay. Once you are discharged, your primary care physician will handle any further medical issues. Please note that NO REFILLS for any discharge medications will be authorized once you are discharged, as it is imperative that you return to your primary care physician (or establish a relationship with a primary care physician if you do not have one) for your post hospital discharge needs so that they can reassess your need for medications and monitor your lab values.  Total Time spent coordinating discharge including counseling, education and face to face time equals 35 minutes.  SignedOren Binet 02/14/2019 9:13 AM

## 2019-02-20 ENCOUNTER — Other Ambulatory Visit: Payer: Self-pay | Admitting: General Surgery

## 2019-02-20 DIAGNOSIS — K5712 Diverticulitis of small intestine without perforation or abscess without bleeding: Secondary | ICD-10-CM

## 2019-02-26 ENCOUNTER — Telehealth: Payer: Self-pay

## 2019-02-26 NOTE — Telephone Encounter (Signed)
13-hr prep called to [facility] [place] [facility] [place]  Patient is aware this has been called in, and she was informed she will need to take Prednisone 50mg  PO on 03/27/19 @ 0005, 0600 and 1200; Benadryl 50mg  PO 03/27/19 @ 1200.

## 2019-03-26 ENCOUNTER — Other Ambulatory Visit (INDEPENDENT_AMBULATORY_CARE_PROVIDER_SITE_OTHER): Payer: 59

## 2019-03-26 ENCOUNTER — Ambulatory Visit: Payer: 59 | Admitting: Nurse Practitioner

## 2019-03-26 ENCOUNTER — Encounter: Payer: Self-pay | Admitting: Nurse Practitioner

## 2019-03-26 VITALS — BP 124/77 | HR 73 | Temp 98.0°F | Ht 63.0 in | Wt 196.0 lb

## 2019-03-26 DIAGNOSIS — K5732 Diverticulitis of large intestine without perforation or abscess without bleeding: Secondary | ICD-10-CM | POA: Diagnosis not present

## 2019-03-26 DIAGNOSIS — K529 Noninfective gastroenteritis and colitis, unspecified: Secondary | ICD-10-CM

## 2019-03-26 DIAGNOSIS — Z8379 Family history of other diseases of the digestive system: Secondary | ICD-10-CM

## 2019-03-26 DIAGNOSIS — K219 Gastro-esophageal reflux disease without esophagitis: Secondary | ICD-10-CM

## 2019-03-26 DIAGNOSIS — Z1159 Encounter for screening for other viral diseases: Secondary | ICD-10-CM

## 2019-03-26 LAB — CBC WITH DIFFERENTIAL/PLATELET
Basophils Absolute: 0.1 10*3/uL (ref 0.0–0.1)
Basophils Relative: 1.3 % (ref 0.0–3.0)
Eosinophils Absolute: 0.5 10*3/uL (ref 0.0–0.7)
Eosinophils Relative: 4.2 % (ref 0.0–5.0)
HCT: 37.5 % (ref 36.0–46.0)
Hemoglobin: 12.3 g/dL (ref 12.0–15.0)
Lymphocytes Relative: 34.4 % (ref 12.0–46.0)
Lymphs Abs: 3.7 10*3/uL (ref 0.7–4.0)
MCHC: 32.6 g/dL (ref 30.0–36.0)
MCV: 79 fl (ref 78.0–100.0)
Monocytes Absolute: 0.7 10*3/uL (ref 0.1–1.0)
Monocytes Relative: 6.7 % (ref 3.0–12.0)
Neutro Abs: 5.7 10*3/uL (ref 1.4–7.7)
Neutrophils Relative %: 53.4 % (ref 43.0–77.0)
Platelets: 534 10*3/uL — ABNORMAL HIGH (ref 150.0–400.0)
RBC: 4.75 Mil/uL (ref 3.87–5.11)
RDW: 15.6 % — ABNORMAL HIGH (ref 11.5–15.5)
WBC: 10.7 10*3/uL — ABNORMAL HIGH (ref 4.0–10.5)

## 2019-03-26 LAB — C-REACTIVE PROTEIN: CRP: <1 mg/dL (ref 0.5–20.0)

## 2019-03-26 MED ORDER — ONDANSETRON 4 MG PO TBDP: 4.0000 mg | ORAL_TABLET | Freq: Three times a day (TID) | ORAL | 0 refills | Status: DC | PRN

## 2019-03-26 NOTE — Patient Instructions (Addendum)
If you are age 65 or older, your body mass index should be between 23-30. Your Body mass index is 34.72 kg/m. If this is out of the aforementioned range listed, please consider follow up with your Primary Care Provider.  If you are age 65 or younger, your body mass index should be between 19-25. Your Body mass index is 34.72 kg/m. If this is out of the aformentioned range listed, please consider follow up with your Primary Care Provider.   Your provider has requested that you go to the basement level for lab work before leaving today. Press "B" on the elevator. The lab is located at the first door on the left as you exit the elevator.  You have been scheduled for a colonoscopy. Please follow written instructions given to you at your visit today.  Please pick up your prep supplies at the pharmacy within the next 1-3 days. If you use inhalers (even only as needed), please bring them with you on the day of your procedure.  Ondansetron 4 mg ODT desolve on tongue every 6-8 hours as needed for nasuea.

## 2019-03-26 NOTE — Progress Notes (Deleted)
03/26/2019 Carol [name] [id number] 05-05-1955   HISTORY OF PRESENT ILLNESS: Carol [name] is a 64 year old female with a past medical history of hypertension, DM II, GERD, diverticulitis with perforation s/p colectomy 11/2013. Dr. Lindie Spruce   She presented to [facility] ER 02/12/2019 with lower abdominal pain. CTAP showed enterocolitis.  She received IV Zosyn and her abdominal pain significantly improved. She was discharged home 02/14/2019 on Augmentin 875 mg one po bid x 5 days.   She is scheduled for a CT enterography   Feb 2019 she had an attack ? Diverticulitis tx'd with cipro and Flagyl. Dec 2019 another attack. CT by Dr. Lindie Spruce. Cipro and Flagyl Nov. 2020 she lower abd pain, nausea with a fever. To [facility]. She took Cipro and Flagyl Sat. Monday to ER. Discharged Thurs. Finished Augmentin.   Last week some nausea and lowe abd discomfort. Felt constipated. She took a stool softener. Bland diet. No further pain.   No diarrhea. No rectal bleeding.   02/2014 colonoscopy.  CT enterography tomr  2 colonoscopies in her life time  18-Aug-2008 and Aug 18, 2013, no polyps      Mother died heart dz 30. Bowel perforatin, MI. Sister age 25 with diverticular dz, surgery, post surgical perforation  Father died lung cancer 75.                                                      Abdominal/pelvic CT 02/11/2019: 1. Inflammation in the lower abdomen and upper pelvis. Inflammation involving at least one small bowel loop and asymmetric wall thickening involving a small segment of the sigmoid colon. The nidus for the inflammation is uncertain and could be related to the colon or small bowel. No evidence for bowel perforation or abscess. No evidence for a bowel obstruction. Recommend follow-up imaging to ensure resolution of the wall thickening and exclude an underlying neoplastic process. 2. High-density material in the gallbladder is most likely related to  cholelithiasis.    Past Medical History:  Diagnosis Date  . Chest discomfort    , Atypical  . Gastroesophageal reflux disease   . Hyperlipidemia   . Hypertension   . Systolic dysfunction    , Hyperdynamic   Past Surgical History:  Procedure Laterality Date  . APPENDECTOMY N/A 11/05/2013   Procedure: APPENDECTOMY;  Surgeon: Cherylynn Ridges, MD;  Location: [facility] OR;  Service: General;  Laterality: N/A;  . BOWEL RESECTION N/A 11/05/2013   Procedure: SMALL BOWEL RESECTION;  Surgeon: Cherylynn Ridges, MD;  Location: [facility] OR;  Service: General;  Laterality: N/A;  . LAPAROTOMY N/A 11/05/2013   Procedure: EXPLORATORY LAPAROTOMY;  Surgeon: Cherylynn Ridges, MD;  Location: [facility] OR;  Service: General;  Laterality: N/A;  . TOTAL ABDOMINAL HYSTERECTOMY      reports that she has never smoked. She does not have any smokeless tobacco history on file. She reports current alcohol use. She reports that she does not use drugs. family history includes Cancer in her father; Heart attack in her brother and mother; Heart disease in her father and mother. Allergies  Allergen Reactions  . Ivp Dye [Iodinated Diagnostic Agents] Hives and Shortness Of Breath    Did ok in OP setting with 13 hr Premedications   . Lisinopril Cough  . Sulfa Antibiotics Hives      Outpatient Encounter  Medications as of 03/26/2019  Medication Sig  . aspirin 81 MG tablet Take 81 mg by mouth at bedtime.   . candesartan (ATACAND) 16 MG tablet Take 16 mg by mouth daily.  . cetirizine (ZYRTEC) 10 MG tablet Take 10 mg by mouth at bedtime.   . metFORMIN (GLUCOPHAGE-XR) 750 MG 24 hr tablet Take 750 mg by mouth daily with breakfast.  . metoprolol succinate (TOPROL-XL) 25 MG 24 hr tablet Take 1 tablet (25 mg total) by mouth every morning.  Marland Kitchen omeprazole (PRILOSEC) 20 MG capsule Take 20 mg by mouth daily.  . rosuvastatin (CRESTOR) 40 MG tablet Take 1 tablet (40 mg total) by mouth at bedtime.  . Timolol Maleate 0.5 % (DAILY) SOLN Place 1 drop into both  eyes 2 (two) times daily.   No facility-administered encounter medications on file as of 03/26/2019.     REVIEW OF SYSTEMS  : All other systems reviewed and negative except where noted in the History of Present Illness.   PHYSICAL EXAM: There were no vitals taken for this visit. General: Well developed white female in no acute distress Head: Normocephalic and atraumatic Eyes:  sclerae anicteric,conjunctive pink. Ears: Normal auditory acuity Neck: Supple, no masses.  Lungs: Clear throughout to auscultation Heart: Regular rate and rhythm Abdomen: Soft, nontender, non distended. No masses or hepatomegaly noted. Normal bowel sounds Rectal: *** Musculoskeletal: Symmetrical with no gross deformities  Skin: No lesions on visible extremities Extremities: No edema  Neurological: Alert oriented x 4, grossly nonfocal Cervical Nodes:  No significant cervical adenopathy Psychological:  Alert and cooperative. Normal mood and affect  ASSESSMENT AND PLAN:    CC:  Masneri, Adele Barthel, DO

## 2019-03-27 ENCOUNTER — Ambulatory Visit
Admission: RE | Admit: 2019-03-27 | Discharge: 2019-03-27 | Disposition: A | Discharge status: Home / Self Care | Payer: 59 | Source: Ambulatory Visit | Attending: General Surgery | Admitting: General Surgery

## 2019-03-27 DIAGNOSIS — K5712 Diverticulitis of small intestine without perforation or abscess without bleeding: Secondary | ICD-10-CM

## 2019-03-27 MED ORDER — IOPAMIDOL (ISOVUE-300) INJECTION 61%
100.0000 mL | Freq: Once | INTRAVENOUS | Status: AC | PRN
  Administered 2019-03-27: 100 mL via INTRAVENOUS

## 2019-03-27 NOTE — Progress Notes (Signed)
03/27/2019 Carol [name] [id number] 1956/01/11   HISTORY OF PRESENT ILLNESS: HISTORY OF PRESENT ILLNESS: Carol [name]. Carol [name] is a 63 year old female with a past medical history of hypertension, DM II, GERD and diverticulitis.  She reported having an episode of diverticulitis which resulted in a perforation which required a small bowel resection and appendectomy 11/2013.  She stated a colon resection was not done.  She reports having episodes of diverticulitis treated with Cipro and Flagyl by her PCP February 2019 and December 2019.  She developed nausea, fever and lower abdominal pain.  She was concerned she had recurrence of diverticulitis.  She presented to [facility] emergency room 02/11/2019.  Laboratory studies showed leukocytosis with a WBC 15.3.  Upper natremia with a sodium level of 132.  An abdominal/pelvic CT without contrast identified enterocolitis.  She received IV Zosyn and her abdominal pain significantly improved.  Her WBC decreased to 9.3.  She was discharged home 02/14/2019 on Augmentin 875 mg 1 tab p.o. twice daily for 5 days.  She was seen by her primary care physician who is ordered a CT enterography which is scheduled for 03/27/2019.  She reported having mild nausea with very slight lower abdominal discomfort last week.  At that time, she felt a little constipated.  She took a stool softener and she passed a normal formed bowel movement.  Her lower abdominal pain resolved.  She denies having any diarrhea or rectal bleeding.  She reported having 2 colonoscopies which showed diverticulosis, no polyps in 2010 in 2015.  No family history of colorectal cancer.  However, her mother had a bowel perforation and died at the age of 35 after she developed a postoperative MI.  Her sister underwent colon surgery due to diverticular disease and she developed a post surgical perforation.  Abdominal/pelvic CT without contrast 02/11/2019: 1. Inflammation in the lower abdomen and upper pelvis.  Inflammation involving at least one small bowel loop and asymmetric wall thickening involving a small segment of the sigmoid colon. The nidus for the inflammation is uncertain and could be related to the colon or small bowel. No evidence for bowel perforation or abscess. No evidence for a bowel obstruction. Recommend follow-up imaging to ensure resolution of the wall thickening and exclude an underlying neoplastic process. 2. High-density material in the gallbladder is most likely related to cholelithiasis.  Past Medical History:  Diagnosis Date  . Chest discomfort    , Atypical  . Diabetic acidosis, type II (HCC)   . Gastroesophageal reflux disease   . Glaucoma   . Hyperlipidemia   . Hypertension   . PVC (premature ventricular contraction)   . Systolic dysfunction    , Hyperdynamic   Past Surgical History:  Procedure Laterality Date  . APPENDECTOMY N/A 11/05/2013   Procedure: APPENDECTOMY;  Surgeon: Cherylynn Ridges, MD;  Location: [facility] OR;  Service: General;  Laterality: N/A;  . BOWEL RESECTION N/A 11/05/2013   Procedure: SMALL BOWEL RESECTION;  Surgeon: Cherylynn Ridges, MD;  Location: [facility] OR;  Service: General;  Laterality: N/A;  . LAPAROTOMY N/A 11/05/2013   Procedure: EXPLORATORY LAPAROTOMY;  Surgeon: Cherylynn Ridges, MD;  Location: [facility] OR;  Service: General;  Laterality: N/A;  . TOTAL ABDOMINAL HYSTERECTOMY      reports that she has never smoked. She does not have any smokeless tobacco history on file. She reports current alcohol use. She reports that she does not use drugs. family history includes Cancer in her father; Heart attack  in her brother and mother; Heart disease in her father and mother. Allergies  Allergen Reactions  . Ivp Dye [Iodinated Diagnostic Agents] Hives and Shortness Of Breath    Did ok in OP setting with 13 hr Premedications   . Latanoprost   . Lisinopril Cough  . Sulfa Antibiotics Hives      Outpatient Encounter Medications as of 03/26/2019  Medication  Sig  . aspirin 81 MG tablet Take 81 mg by mouth at bedtime.   . candesartan-hydrochlorothiazide (ATACAND HCT) 16-12.5 MG tablet Take 1 tablet by mouth daily.  . cetirizine (ZYRTEC) 10 MG tablet Take 10 mg by mouth at bedtime.   . clobetasol cream (TEMOVATE) 0.05 % Apply 1 application topically as needed.  Marland Kitchen. estradiol (VIVELLE-DOT) 0.05 MG/24HR patch Place 1 patch onto the skin 2 (two) times a week.  . metFORMIN (GLUCOPHAGE-XR) 750 MG 24 hr tablet Take 750 mg by mouth daily with breakfast.  . metoprolol succinate (TOPROL-XL) 50 MG 24 hr tablet Take 50 mg by mouth daily. Take with or immediately following a meal.  . omeprazole (PRILOSEC) 20 MG capsule Take 20 mg by mouth daily.  . rosuvastatin (CRESTOR) 40 MG tablet Take 1 tablet (40 mg total) by mouth at bedtime.  . Timolol Maleate 0.5 % (DAILY) SOLN Place 1 drop into both eyes 2 (two) times daily.  . ondansetron (ZOFRAN-ODT) 4 MG disintegrating tablet Take 1 tablet (4 mg total) by mouth every 8 (eight) hours as needed for nausea or vomiting.  . [DISCONTINUED] candesartan (ATACAND) 16 MG tablet Take 16 mg by mouth daily.  . [DISCONTINUED] metoprolol succinate (TOPROL-XL) 25 MG 24 hr tablet Take 1 tablet (25 mg total) by mouth every morning.   No facility-administered encounter medications on file as of 03/26/2019.   Family History  Problem Relation Age of Onset  . Heart disease Mother   . Heart attack Mother   . Heart disease Father   . Cancer Father   . Heart attack Brother    Social History   Socioeconomic History  . Marital status: Married    Spouse name: Not on file  . Number of children: Not on file  . Years of education: Not on file  . Highest education level: Not on file  Occupational History  . Not on file  Tobacco Use  . Smoking status: Never Smoker  Substance and Sexual Activity  . Alcohol use: Yes    Comment: Occassional drinker  . Drug use: No  . Sexual activity: Not on file  Other Topics Concern  . Not on file    Social History Narrative  . Not on file   Social Determinants of Health   Financial Resource Strain:   . Difficulty of Paying Living Expenses: Not on file  Food Insecurity:   . Worried About Programme researcher, broadcasting/film/videounning Out of Food in the Last Year: Not on file  . Ran Out of Food in the Last Year: Not on file  Transportation Needs:   . Lack of Transportation (Medical): Not on file  . Lack of Transportation (Non-Medical): Not on file  Physical Activity:   . Days of Exercise per Week: Not on file  . Minutes of Exercise per Session: Not on file  Stress:   . Feeling of Stress : Not on file  Social Connections:   . Frequency of Communication with Friends and Family: Not on file  . Frequency of Social Gatherings with Friends and Family: Not on file  . Attends Religious Services: Not on  file  . Active Member of Clubs or Organizations: Not on file  . Attends Archivist Meetings: Not on file  . Marital Status: Not on file  Intimate Partner Violence:   . Fear of Current or Ex-Partner: Not on file  . Emotionally Abused: Not on file  . Physically Abused: Not on file  . Sexually Abused: Not on file   REVIEW OF SYSTEMS  : All other systems reviewed and negative except where noted in the History of Present Illness.  PHYSICAL EXAM: BP 124/77   Pulse 73   Temp 98 F (36.7 C)   Ht 5\' 3"  (1.6 m)   Wt 196 lb (88.9 kg)   BMI 34.72 kg/m  General: Well developed 63 year old white female in no acute distress Head: Normocephalic and atraumatic Eyes:  Sclerae anicteric,conjunctive pink. Ears: Normal auditory acuity Neck: Supple, no masses.  Lungs: Clear throughout to auscultation Heart: Regular rate and rhythm Abdomen: Soft, trace suprapubic tenderness without rebound or guarding, non distended. No masses or hepatomegaly noted. Normal bowel sounds x 4 quadrants. Rectal: Deferred Musculoskeletal: Symmetrical with no gross deformities  Skin: No lesions on visible extremities Extremities: No edema   Neurological: Alert oriented x 4, grossly nonfocal Cervical Nodes:  No significant cervical adenopathy Psychological:  Alert and cooperative. Normal mood and affect  ASSESSMENT AND PLAN:  62.  63 year old female with a history of diverticulitis with perforation status post small bowel resection and appendectomy 11/2013.  Significant family history of bowel perforations.  See HPI.  Abdominal/pelvic CT scan 02/11/2019 at [facility] showed enterocolitis (formation involving at least one small bowel loop and asymmetric wall thickening involving a segment of the sigmoid colon without perforation or abscess) which resolved after IV and p.o. antibiotics. -Proceed with CT enterography 03/27/2019 as previously ordered -Colonoscopy to be scheduled after CT enterography results reviewed.  Colonoscopy benefits and risk discussed including risk with sedation, risk of bleeding, perforation and infection -CBC, CRP -Patient to call office if her abdominal pain recurs -Avoid constipation, stool softener or MiraLAX as needed  2.  GERD, well controlled   CC:  Masneri, Adele Barthel, DO

## 2019-04-01 ENCOUNTER — Ambulatory Visit (INDEPENDENT_AMBULATORY_CARE_PROVIDER_SITE_OTHER): Payer: 59

## 2019-04-01 DIAGNOSIS — Z1159 Encounter for screening for other viral diseases: Secondary | ICD-10-CM

## 2019-04-02 ENCOUNTER — Encounter: Payer: Self-pay | Admitting: Internal Medicine

## 2019-04-02 LAB — SARS CORONAVIRUS 2 (TAT 6-24 HRS): SARS Coronavirus 2: NEGATIVE

## 2019-04-03 ENCOUNTER — Encounter: Payer: Self-pay | Admitting: Internal Medicine

## 2019-04-03 ENCOUNTER — Other Ambulatory Visit: Payer: Self-pay

## 2019-04-03 ENCOUNTER — Ambulatory Visit (AMBULATORY_SURGERY_CENTER): Payer: 59 | Admitting: Internal Medicine

## 2019-04-03 VITALS — BP 119/47 | HR 58 | Temp 98.4°F | Resp 14 | Ht 63.0 in | Wt 196.0 lb

## 2019-04-03 DIAGNOSIS — K5732 Diverticulitis of large intestine without perforation or abscess without bleeding: Secondary | ICD-10-CM | POA: Diagnosis present

## 2019-04-03 HISTORY — PX: COLONOSCOPY: SHX174

## 2019-04-03 MED ORDER — SODIUM CHLORIDE 0.9 % IV SOLN: 500.0000 mL | Freq: Once | INTRAVENOUS | Status: DC

## 2019-04-03 NOTE — Progress Notes (Signed)
Report to PACU, RN, vss, BBS= Clear.  

## 2019-04-03 NOTE — Progress Notes (Signed)
Temp LC  VS CW  Pt's states no medical or surgical changes since previsit or office visit. Reviewed by admitting RN 

## 2019-04-03 NOTE — Patient Instructions (Addendum)
I saw diverticulosis but no polyps, cancer, inflammation.  Given that you might have onion allergy and ? Of other food allergies let's refer to an allergist.  My office staff will work on that - if you do not hear by early January please contact us.  I appreciate the opportunity to care for you. Gatha Mayer, MD, FACG   YOU HAD AN ENDOSCOPIC PROCEDURE TODAY AT [facility]:   Refer to the procedure report that was given to you for any specific questions about what was found during the examination.  If the procedure report does not answer your questions, please call your gastroenterologist to clarify.  If you requested that your care partner not be given the details of your procedure findings, then the procedure report has been included in a sealed envelope for you to review at your convenience later.  YOU SHOULD EXPECT: Some feelings of bloating in the abdomen. Passage of more gas than usual.  Walking can help get rid of the air that was put into your GI tract during the procedure and reduce the bloating. If you had a lower endoscopy (such as a colonoscopy or flexible sigmoidoscopy) you may notice spotting of blood in your stool or on the toilet paper. If you underwent a bowel prep for your procedure, you may not have a normal bowel movement for a few days.  Please Note:  You might notice some irritation and congestion in your nose or some drainage.  This is from the oxygen used during your procedure.  There is no need for concern and it should clear up in a day or so.  SYMPTOMS TO REPORT IMMEDIATELY:   Following lower endoscopy (colonoscopy or flexible sigmoidoscopy):  Excessive amounts of blood in the stool  Significant tenderness or worsening of abdominal pains  Swelling of the abdomen that is new, acute  Fever of 100F or higher   For urgent or emergent issues, a gastroenterologist can be reached at any hour by calling [phone].   DIET:  We do recommend a  small meal at first, but then you may proceed to your regular diet.  Drink plenty of fluids but you should avoid alcoholic beverages for 24 hours.  ACTIVITY:  You should plan to take it easy for the rest of today and you should NOT DRIVE or use heavy machinery until tomorrow (because of the sedation medicines used during the test).    FOLLOW UP: Our staff will call the number listed on your records 48-72 hours following your procedure to check on you and address any questions or concerns that you may have regarding the information given to you following your procedure. If we do not reach you, we will leave a message.  We will attempt to reach you two times.  During this call, we will ask if you have developed any symptoms of COVID 19. If you develop any symptoms (ie: fever, flu-like symptoms, shortness of breath, cough etc.) before then, please call [phone].  If you test positive for Covid 19 in the 2 weeks post procedure, please call and report this information to [place].    If any biopsies were taken you will be contacted by phone or by letter within the next 1-3 weeks.  Please call us at [phone] if you have not heard about the biopsies in 3 weeks.    SIGNATURES/CONFIDENTIALITY: You and/or your care partner have signed paperwork which will be entered into your electronic medical record.  These signatures  attest to the fact that that the information above on your After Visit Summary has been reviewed and is understood.  Full responsibility of the confidentiality of this discharge information lies with you and/or your care-partner.

## 2019-04-03 NOTE — Op Note (Signed)
[place] Patient Name: Carol [name] Procedure Date: 04/03/2019 11:37 AM MRN: [id number] Endoscopist: Gatha Mayer , MD Age: 63 Referring MD:  Date of Birth: [birth year] Gender: Female Account #: [id number] Procedure:                Colonoscopy Indications:              Suspected diverticulitis, Follow-up of                            diverticulitis Medicines:                Propofol per Anesthesia, Monitored Anesthesia Care Procedure:                Pre-Anesthesia Assessment:                           - Prior to the procedure, a History and Physical                            was performed, and patient medications and                            allergies were reviewed. The patient's tolerance of                            previous anesthesia was also reviewed. The risks                            and benefits of the procedure and the sedation                            options and risks were discussed with the patient.                            All questions were answered, and informed consent                            was obtained. Prior Anticoagulants: The patient has                            taken no previous anticoagulant or antiplatelet                            agents. ASA Grade Assessment: II - A patient with                            mild systemic disease. After reviewing the risks                            and benefits, the patient was deemed in                            satisfactory condition to undergo the procedure.  After obtaining informed consent, the colonoscope                            was passed under direct vision. Throughout the                            procedure, the patient's blood pressure, pulse, and                            oxygen saturations were monitored continuously. The                            Colonoscope was introduced through the anus and                            advanced to the the terminal  ileum, with                            identification of the appendiceal orifice and IC                            valve. The colonoscopy was performed without                            difficulty. The patient tolerated the procedure                            well. The quality of the bowel preparation was                            excellent. The bowel preparation used was Miralax                            via split dose instruction. The terminal ileum,                            ileocecal valve, appendiceal orifice, and rectum                            were photographed. Scope In: 11:54:01 AM Scope Out: 12:03:35 PM Scope Withdrawal Time: 0 hours 6 minutes 29 seconds  Total Procedure Duration: 0 hours 9 minutes 34 seconds  Findings:                 The perianal and digital rectal examinations were                            normal.                           Multiple diverticula were found in the sigmoid                            colon. There was narrowing of the colon in  association with the diverticular opening.                           The terminal ileum appeared normal.                           The exam was otherwise without abnormality on                            direct and retroflexion views. Complications:            No immediate complications. Estimated Blood Loss:     Estimated blood loss: none. Impression:               - Moderate diverticulosis in the sigmoid colon.                            There was narrowing of the colon in association                            with the diverticular opening.                           - The examined portion of the ileum was normal.                           - The examination was otherwise normal on direct                            and retroflexion views. NOT SURE SHE HAD                            DIVERTICULITIS RECENTLY - MAY HAVE BEEN ENTERIC                            INFECTION (BEGAN AFTER BEEF BUT  NO RASH, ETC)                           - No specimens collected. Recommendation:           - Patient has a contact number available for                            emergencies. The signs and symptoms of potential                            delayed complications were discussed with the                            patient. Return to normal activities tomorrow.                            Written discharge instructions were provided to the                            patient.                           -  Resume previous diet. ADVANCE AS TOLERATED                           - Continue present medications.                           - Repeat colonoscopy in 10 years for screening                            purposes.                           - REFER TO ALLERGY AND ASTHMA - ? ONION AND OTHER                            FOOD ALLERGIES - PLEASE EVALUATE Carol Booparl E Wissam Resor, MD 04/03/2019 12:23:34 PM This report has been signed electronically.

## 2019-04-08 ENCOUNTER — Telehealth: Payer: Self-pay | Admitting: *Deleted

## 2019-04-08 ENCOUNTER — Other Ambulatory Visit: Payer: Self-pay

## 2019-04-08 DIAGNOSIS — Z91018 Allergy to other foods: Secondary | ICD-10-CM

## 2019-04-08 NOTE — Telephone Encounter (Signed)
1. Have you developed a fever since your procedure? Yes,ran fever dec 24 & 25 but took advil and no fever or other symptoms since   2.   Have you had an respiratory symptoms (SOB or cough) since your procedure? no  3.   Have you tested positive for COVID 19 since your procedure no  4.   Have you had any family members/close contacts diagnosed with the COVID 19 since your procedure?  no   If yes to any of these questions please route to Joylene John, RN and Alphonsa Gin, Therapist, sports.  Follow up Call-  Call back number 04/03/2019  Post procedure Call Back phone  # [phone]  Permission to leave phone message Yes  Some recent data might be hidden     Patient questions:  Do you have a fever, pain , or abdominal swelling? No. Pain Score  0 *  Have you tolerated food without any problems? Yes.    Have you been able to return to your normal activities? Yes.    Do you have any questions about your discharge instructions: Diet   No. Medications  No. Follow up visit  No.  Do you have questions or concerns about your Care? No.  Actions: * If pain score is 4 or above: No action needed, pain <4.

## 2019-04-16 ENCOUNTER — Ambulatory Visit: Payer: 59 | Admitting: Allergy and Immunology

## 2019-04-16 ENCOUNTER — Other Ambulatory Visit: Payer: Self-pay

## 2019-04-16 ENCOUNTER — Encounter: Payer: Self-pay | Admitting: Allergy and Immunology

## 2019-04-16 VITALS — BP 126/84 | HR 72 | Temp 97.8°F | Resp 18 | Ht 63.0 in | Wt 193.8 lb

## 2019-04-16 DIAGNOSIS — T781XXD Other adverse food reactions, not elsewhere classified, subsequent encounter: Secondary | ICD-10-CM | POA: Diagnosis not present

## 2019-04-16 NOTE — Progress Notes (Signed)
[place] - [place] - [place] - [place]   Dear Dr. Leone Payor,  Thank you for referring [name] to the [facility] [place] on 04/16/2019.   Below is a summation of this patient's evaluation and recommendations.  Thank you for your referral. I will keep you informed about this patient's response to treatment.   If you have any questions please do not hesitate to contact me.   Sincerely,  Jessica Priest, MD Allergy / Immunology [facility]   ______________________________________________________________________    NEW PATIENT NOTE  Referring Provider: Iva Boop, MD Primary Provider: Koren Shiver, DO Date of office visit: 04/16/2019    Subjective:   Chief Complaint:  Carol [name] (DOB: June 24, 1955) is a 64 y.o. female who presents to the clinic on 04/16/2019 with a chief complaint of Diverticulitis and Ulcerative Colitis .     HPI: Vinette presents to this clinic in evaluation of possible food allergy.  Apparently she has several sensitivities directed against environmental exposures.  She states that if she eats salads or any type of greens she gets pain and diarrhea.  If she eats Citrus she gets stomach burning.  If she eats onions she gets tongue swelling and a headache.  If she is exposed to sunblock she gets red skin.  If she is exposed to dishwashing detergent she gets peeling of the skin on her hands.  If she is exposed to various colognes or odor she gets a headache.  If she is exposed to artificial sweetener she gets a headache.  Apparently when she drinks milk she gets "stones" in her mouth and several of these have had a been removed.  She points to her salivary glands concerning the "stones".  Interestingly she can eat cheese with no problem.  In addition to the sensitivities described above she apparently has been having problems with recurrent "colitis".   She has had 3 episodes since February 2019 with her last episode occurring in November 2020.  Her last episode was the worst episode and required hospitalization and a fair amount of antibiotics apparently.  Her "colitis" presents as pain and then she gets nauseated and then she gets severe pain associated with fever.  Past Medical History:  Diagnosis Date  . Angio-edema   . Chest discomfort    , Atypical  . Diabetic acidosis, type II (HCC)   . Diverticulitis - large vs small bowel vs both?   . Gastroesophageal reflux disease   . Glaucoma   . Hyperlipidemia   . Hypertension   . PVC (premature ventricular contraction)   . Systolic dysfunction    , Hyperdynamic    Past Surgical History:  Procedure Laterality Date  . APPENDECTOMY N/A 11/05/2013   Procedure: APPENDECTOMY;  Surgeon: Cherylynn Ridges, MD;  Location: [facility] OR;  Service: General;  Laterality: N/A;  . 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 COLONOSCOPY    . LAPAROTOMY N/A 11/05/2013   Procedure: EXPLORATORY LAPAROTOMY;  Surgeon: Cherylynn Ridges, MD;  Location: [facility] OR;  Service: General;  Laterality: N/A;  . TOTAL ABDOMINAL HYSTERECTOMY      Allergies as of 04/16/2019      Reactions   Ivp Dye [iodinated Diagnostic Agents] Hives, Shortness Of Breath   Did ok in OP setting with 13 hr Premedications    Latanoprost    Lisinopril Cough   Onion  Rash   Rash and GI upset   Sulfa Antibiotics Hives      Medication List      aspirin 81 MG tablet Take 81 mg by mouth at bedtime.   candesartan-hydrochlorothiazide 16-12.5 MG tablet Commonly known as: ATACAND HCT Take 1 tablet by mouth daily.   cetirizine 10 MG tablet Commonly known as: ZYRTEC Take 10 mg by mouth at bedtime.   clobetasol cream 0.05 % Commonly known as: TEMOVATE Apply 1 application topically as needed.   estradiol 0.05 MG/24HR patch Commonly known as: VIVELLE-DOT Place 1 patch onto the skin 2 (two) times a week.   metFORMIN 750 MG  24 hr tablet Commonly known as: GLUCOPHAGE-XR Take 750 mg by mouth daily with breakfast.   metoprolol succinate 50 MG 24 hr tablet Commonly known as: TOPROL-XL Take 50 mg by mouth daily. Take with or immediately following a meal.   omeprazole 20 MG capsule Commonly known as: PRILOSEC Take 20 mg by mouth daily.   ondansetron 4 MG disintegrating tablet Commonly known as: ZOFRAN-ODT Take 1 tablet (4 mg total) by mouth every 8 (eight) hours as needed for nausea or vomiting.   rosuvastatin 40 MG tablet Commonly known as: CRESTOR Take 1 tablet (40 mg total) by mouth at bedtime.   Timolol Maleate 0.5 % (DAILY) Soln Place 1 drop into both eyes 2 (two) times daily.   Travoprost (BAK Free) 0.004 % Soln ophthalmic solution Commonly known as: TRAVATAN Place 1 drop into both eyes at bedtime.       Review of systems negative except as noted in HPI / PMHx or noted below:  Review of Systems  Constitutional: Negative.   HENT: Negative.   Eyes: Negative.   Respiratory: Negative.   Cardiovascular: Negative.   Gastrointestinal: Negative.   Genitourinary: Negative.   Musculoskeletal: Negative.   Skin: Negative.   Neurological: Negative.   Endo/Heme/Allergies: Negative.   Psychiatric/Behavioral: Negative.     Family History  Problem Relation Age of Onset  . Heart disease Mother   . Heart attack Mother   . Asthma Mother   . Heart disease Father   . Cancer Father   . Heart attack Brother   . Asthma Brother   . Colon cancer Neg Hx   . Esophageal cancer Neg Hx   . Rectal cancer Neg Hx   . Stomach cancer Neg Hx     Social History   Socioeconomic History  . Marital status: Married    Spouse name: Not on file  . Number of children: Not on file  . Years of education: Not on file  . Highest education level: Not on file  Occupational History  . Not on file  Tobacco Use  . Smoking status: Never Smoker  . Smokeless tobacco: Never Used  Substance and Sexual Activity  .  Alcohol use: Yes    Comment: Occassional drinker  . Drug use: No  . Sexual activity: Not on file  Other Topics Concern  . Not on file  Social History Narrative  . Not on file   Social Determinants of Health   Financial Resource Strain:   . Difficulty of Paying Living Expenses: Not on file  Food Insecurity:   . Worried About Programme researcher, broadcasting/film/video in the Last Year: Not on file  . Ran Out of Food in the Last Year: Not on file  Transportation Needs:   . Lack of Transportation (Medical): Not on file  . Lack of Transportation (Non-Medical): Not on file  Physical Activity:   . Days of Exercise per Week: Not on file  . Minutes of Exercise per Session: Not on file  Stress:   . Feeling of Stress : Not on file  Social Connections:   . Frequency of Communication with Friends and Family: Not on file  . Frequency of Social Gatherings with Friends and Family: Not on file  . Attends Religious Services: Not on file  . Active Member of Clubs or Organizations: Not on file  . Attends Archivist Meetings: Not on file  . Marital Status: Not on file  Intimate Partner Violence:   . Fear of Current or Ex-Partner: Not on file  . Emotionally Abused: Not on file  . Physically Abused: Not on file  . Sexually Abused: Not on file    Environmental and Social history  Lives in a house with a dry environment, cat and dog located at the household, carpet in the bedroom, no plastic on the bed, no plastic on the pillow, no smoking ongoing inside the household.  She works in an office setting.  Objective:   Vitals:   04/16/19 1406  BP: 126/84  Pulse: 72  Resp: 18  Temp: 97.8 F (36.6 C)  SpO2: 95%   Height: 5\' 3"  (160 cm) Weight: 193 lb 12.8 oz (87.9 kg)  Physical Exam Constitutional:      Appearance: She is not diaphoretic.  HENT:     Head: Normocephalic.     Right Ear: Tympanic membrane, ear canal and external ear normal.     Left Ear: Tympanic membrane, ear canal and external ear  normal.     Nose: Nose normal. No mucosal edema or rhinorrhea.     Mouth/Throat:     Pharynx: Uvula midline. No oropharyngeal exudate.  Eyes:     Conjunctiva/sclera: Conjunctivae normal.  Neck:     Thyroid: No thyromegaly.     Trachea: Trachea normal. No tracheal tenderness or tracheal deviation.  Cardiovascular:     Rate and Rhythm: Normal rate and regular rhythm.     Heart sounds: Normal heart sounds, S1 normal and S2 normal. No murmur.  Pulmonary:     Effort: No respiratory distress.     Breath sounds: Normal breath sounds. No stridor. No wheezing or rales.  Lymphadenopathy:     Head:     Right side of head: No tonsillar adenopathy.     Left side of head: No tonsillar adenopathy.     Cervical: No cervical adenopathy.  Skin:    Findings: No erythema or rash.     Nails: There is no clubbing.  Neurological:     Mental Status: She is alert.     Diagnostics: Allergy skin tests were performed.  She did not demonstrate any hypersensitivity against a screening panel of foods.  Assessment and Plan:    1. Adverse food reaction, subsequent encounter     No objective evidence collected during today's evaluation suggest that a food allergy or an overactive allergic immune system is giving rise to any of the Brizza's symptomatology or adverse effects directed against various food consumptions and her recurrent inflammatory/infectious GI disease.  I have informed her about her results and did not really have much else to offer at this point in time concerning any further evaluation or treatment that is required to address her issues.  Jiles Prows, MD Allergy / Immunology [facility]

## 2019-04-17 ENCOUNTER — Encounter: Payer: Self-pay | Admitting: Allergy and Immunology

## 2019-05-09 DIAGNOSIS — U071 COVID-19: Secondary | ICD-10-CM

## 2019-05-09 HISTORY — DX: COVID-19: U07.1

## 2019-06-03 ENCOUNTER — Encounter: Payer: Self-pay | Admitting: Family Medicine

## 2019-06-10 ENCOUNTER — Other Ambulatory Visit: Payer: Self-pay

## 2019-06-10 ENCOUNTER — Ambulatory Visit: Payer: 59 | Admitting: Family Medicine

## 2019-06-10 ENCOUNTER — Encounter: Payer: Self-pay | Admitting: Family Medicine

## 2019-06-10 VITALS — BP 110/67 | HR 64 | Temp 98.2°F | Resp 16 | Ht 63.75 in | Wt 194.4 lb

## 2019-06-10 DIAGNOSIS — E119 Type 2 diabetes mellitus without complications: Secondary | ICD-10-CM | POA: Diagnosis not present

## 2019-06-10 DIAGNOSIS — K219 Gastro-esophageal reflux disease without esophagitis: Secondary | ICD-10-CM

## 2019-06-10 DIAGNOSIS — I10 Essential (primary) hypertension: Secondary | ICD-10-CM

## 2019-06-10 DIAGNOSIS — E785 Hyperlipidemia, unspecified: Secondary | ICD-10-CM

## 2019-06-10 DIAGNOSIS — E6609 Other obesity due to excess calories: Secondary | ICD-10-CM | POA: Diagnosis not present

## 2019-06-10 DIAGNOSIS — Z6834 Body mass index (BMI) 34.0-34.9, adult: Secondary | ICD-10-CM

## 2019-06-10 LAB — POCT GLYCOSYLATED HEMOGLOBIN (HGB A1C)
HbA1c POC (<> result, manual entry): 6.1 % (ref 4.0–5.6)
HbA1c, POC (controlled diabetic range): 6.1 % (ref 0.0–7.0)
HbA1c, POC (prediabetic range): 6.1 % (ref 5.7–6.4)
Hemoglobin A1C: 6.1 % — AB (ref 4.0–5.6)

## 2019-06-10 MED ORDER — METFORMIN HCL ER 750 MG PO TB24: 750.0000 mg | ORAL_TABLET | Freq: Every day | ORAL | 5 refills | Status: DC

## 2019-06-10 MED ORDER — OMEPRAZOLE 20 MG PO CPDR: 20.0000 mg | DELAYED_RELEASE_CAPSULE | Freq: Every day | ORAL | 5 refills | Status: DC

## 2019-06-10 MED ORDER — ROSUVASTATIN CALCIUM 40 MG PO TABS: 40.0000 mg | ORAL_TABLET | Freq: Every day | ORAL | 5 refills | Status: DC

## 2019-06-10 MED ORDER — METOPROLOL SUCCINATE ER 50 MG PO TB24: 50.0000 mg | ORAL_TABLET | Freq: Every day | ORAL | 5 refills | Status: DC

## 2019-06-10 MED ORDER — CANDESARTAN CILEXETIL-HCTZ 16-12.5 MG PO TABS: 1.0000 | ORAL_TABLET | Freq: Every day | ORAL | 5 refills | Status: DC

## 2019-06-10 NOTE — Progress Notes (Signed)
Patient ID: Carol [name], female  DOB: 06-Sep-1955, 64 y.o.   MRN: [id number] Patient Care Team    Relationship Specialty Notifications Start End  Natalia Leatherwood, DO PCP - General Family Medicine  06/10/19   Jake Bathe, MD Consulting Physician Cardiology  06/12/19   Iva Boop, MD Consulting Physician Gastroenterology  06/12/19   Elwin Mocha, MD Consulting Physician Ophthalmology  06/12/19   Frankey Poot, MD Referring Physician Obstetrics and Gynecology  06/12/19     Chief Complaint  Patient presents with  . Establish Care    Prior PCP [facility]. Will need refills in about 1 month.     Subjective:  Carol [name] is a 64 y.o.  female present for new patient establishment. All past medical history, surgical history, allergies, family history, immunizations, medications and social history were updated in the electronic medical record today. All recent labs, ED visits and hospitalizations within the last year were reviewed.  Type 2 diabetes mellitus without complication, without long-term current use of insulin (HCC)/obesity Pt reports compliance with metformin 750 mg daily. Denies numbness, tingling of extremities, hypo/hyperglycemic events or non-healing wounds. Pt reports BG ranges 117-1 40.  PNA series: Unknown if completed.  Start after her 64th birthday in November. Flu shot: Up-to-date 2020 (recommneded yearly) Foot exam: We will complete at next appointment Eye exam: Up-to-date 01/2019, Dr. Sherryll Burger A1c: 6.9> 6.1 today  Essential hypertension/HTN Pt reports compliance with Toprol-XL 50, candesartan-HCTZ 16-12.5 mg daily, Crestor 40 mg daily and baby aspirin. Blood pressures ranges at home within normal limits. Patient denies chest pain, shortness of breath or lower extremity edema. Pt takes a daily baby ASA. Pt is  prescribed statin. RF: Hypertension, hyperlipidemia, diabetic, obesity, family history.   Gastroesophageal reflux disease without esophagitis Patient  reports symptoms are controlled on her omeprazole 20 mg daily.  She has been unable to discontinue medication in the past without recurrence of symptoms immediately.  Depression screen [facility] 2/9 06/10/2019  Decreased Interest 0  Down, Depressed, Hopeless 0  PHQ - 2 Score 0   No flowsheet data found.      No flowsheet data found. Immunization History  Administered Date(s) Administered  . Influenza,inj,Quad PF,6+ Mos 03/30/2019  . Tdap 08/27/2012    No exam data present  Past Medical History:  Diagnosis Date  . Angio-edema   . Chest discomfort    , Atypical  . Chicken pox   . COVID-19 05/09/2019  . Diabetic acidosis, type II (HCC)   . Diverticulitis - large vs small bowel vs both?   . Gastroesophageal reflux disease   . Glaucoma   . Hyperlipidemia   . Hypertension   . PVC (premature ventricular contraction)   . Systolic dysfunction    , Hyperdynamic   Allergies  Allergen Reactions  . Ivp Dye [Iodinated Diagnostic Agents] Hives and Shortness Of Breath    Did ok in OP setting with 13 hr Premedications   . Latanoprost   . Lisinopril Cough  . Onion Rash    Rash and GI upset  . Sulfa Antibiotics Hives   Past Surgical History:  Procedure Laterality Date  . APPENDECTOMY N/A 11/05/2013   Procedure: APPENDECTOMY;  Surgeon: Cherylynn Ridges, MD;  Location: [facility] OR;  Service: General;  Laterality: N/A;  . 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 COLONOSCOPY  04/03/2019  . LAPAROTOMY N/A 11/05/2013   Procedure: EXPLORATORY LAPAROTOMY;  Surgeon: Cherylynn Ridges, MD;  Location: [facility] OR;  Service: General;  Laterality: N/A;  . TOTAL ABDOMINAL HYSTERECTOMY  2004   Family History  Problem Relation Age of Onset  . Heart disease Mother   . Heart attack Mother   . Asthma Mother   . Early death Mother   . Miscarriages / [place] Mother   . Heart disease Father   . Lung cancer Father   . Heart attack Brother   . Asthma Brother   . Heart disease Brother   .  Kidney disease Brother   . Diverticulitis Sister   . Heart disease Brother   . Heart attack Brother   . Heart disease Brother   . Heart attack Brother   . Early death Maternal Grandmother   . Tuberculosis Maternal Grandmother   . Depression Maternal Grandfather   . Heart disease Paternal Grandmother   . Heart disease Sister   . Colon cancer Neg Hx   . Esophageal cancer Neg Hx   . Rectal cancer Neg Hx   . Stomach cancer Neg Hx    Social History   Social History Narrative   Marital status/children/pets: Married.   Education/employment: Employed as an Glass blower/designer, high school Writer.   Safety:      -smoke alarm in the home:Yes     - wears seatbelt: Yes     - Feels safe in their relationships: Yes    Allergies as of 06/10/2019      Reactions   Ivp Dye [iodinated Diagnostic Agents] Hives, Shortness Of Breath   Did ok in OP setting with 13 hr Premedications    Latanoprost    Lisinopril Cough   Onion Rash   Rash and GI upset   Sulfa Antibiotics Hives      Medication List       Accurate as of June 10, 2019 11:59 PM. If you have any questions, ask your nurse or doctor.        STOP taking these medications   ondansetron 4 MG disintegrating tablet Commonly known as: ZOFRAN-ODT Stopped by: Howard Pouch, DO     TAKE these medications   aspirin 81 MG tablet Take 81 mg by mouth at bedtime.   candesartan-hydrochlorothiazide 16-12.5 MG tablet Commonly known as: ATACAND HCT Take 1 tablet by mouth daily.   cetirizine 10 MG tablet Commonly known as: ZYRTEC Take 10 mg by mouth at bedtime.   clobetasol cream 0.05 % Commonly known as: TEMOVATE Apply 1 application topically 2 (two) times daily. PRN What changed: Another medication with the same name was removed. Continue taking this medication, and follow the directions you see here. Changed by: Howard Pouch, DO   estradiol 0.025 mg/24hr patch Commonly known as: CLIMARA - Dosed in mg/24 hr Place onto the skin. What  changed: Another medication with the same name was removed. Continue taking this medication, and follow the directions you see here. Changed by: Howard Pouch, DO   metFORMIN 750 MG 24 hr tablet Commonly known as: GLUCOPHAGE-XR Take 1 tablet (750 mg total) by mouth daily with breakfast.   metoprolol succinate 50 MG 24 hr tablet Commonly known as: TOPROL-XL Take 1 tablet (50 mg total) by mouth daily. Take with or immediately following a meal.   omeprazole 20 MG capsule Commonly known as: PRILOSEC Take 1 capsule (20 mg total) by mouth daily.   OneTouch Delica Plus [id number] Misc check blood sugar TWICE DAILY   OneTouch Ultra test strip Generic drug: glucose blood test sugars TWICE DAILY as directed  rosuvastatin 40 MG tablet Commonly known as: CRESTOR Take 1 tablet (40 mg total) by mouth at bedtime.   Timolol Maleate 0.5 % (DAILY) Soln Place 1 drop into both eyes 2 (two) times daily.   Travoprost (BAK Free) 0.004 % Soln ophthalmic solution Commonly known as: TRAVATAN Place 1 drop into both eyes at bedtime.       All past medical history, surgical history, allergies, family history, immunizations andmedications were updated in the EMR today and reviewed under the history and medication portions of their EMR.     ROS: 14 pt review of systems performed and negative (unless mentioned in an HPI)  Objective: BP 110/67 (BP Location: Left Arm, Patient Position: Sitting, Cuff Size: Normal)   Pulse 64   Temp 98.2 F (36.8 C) (Temporal)   Resp 16   Ht 5' 3.75" (1.619 m)   Wt 194 lb 6 oz (88.2 kg)   SpO2 95%   BMI 33.63 kg/m  Gen: Afebrile. No acute distress. Nontoxic in appearance, well-developed, well-nourished, pleasant, obese, Caucasian female HENT: AT. [place].  Moist mucous membrane Eyes:Pupils Equal Round Reactive to light, Extraocular movements intact,  Conjunctiva without redness, discharge or icterus. Neck/lymp/endocrine: Supple, no lymphadenopathy, no thyromegaly CV:  RRR 1/6 systolic murmur appreciated, no edema, +2/4 P posterior tibialis pulses.  Chest: CTAB, no wheeze, rhonchi or crackles.  Skin: Warm and well-perfused. Skin intact. Neuro/Msk:  Normal gait. PERLA. EOMi. Alert. Oriented x3.  Psych: Normal affect, dress and demeanor. Normal speech. Normal thought content and judgment.  Assessment/plan: Carol [name] is a 64 y.o. female present for est/cmc Type 2 diabetes mellitus without complication, without long-term current use of insulin (HCC)/obesity Stable. Continue Metformin 750 mg daily Continue monitoring blood glucose daily -PNA series: Unknown if completed.  Start after her 64th birthday in November. Flu shot: Up-to-date 2020 (recommneded yearly) Foot exam: We will complete at next appointment Eye exam: Up-to-date 01/2019, Dr. Sherryll Burger A1c: 6.9> 6.1 today -Follow-up every 4 months  Essential hypertension/HTN Stable. Continue metoprolol XL 50 mg Continue candesartan/HCTZ 16-12.5 mg Continue Crestor 40 mg daily Continue baby aspirin daily Follow-up every 4 months with chronic conditions   Gastroesophageal reflux disease without esophagitis -Stable. -Continue omeprazole 20 mg daily   Return in about 4 months (around 10/10/2019) for CPE (30 min).  Orders Placed This Encounter  Procedures  . POCT glycosylated hemoglobin (Hb A1C)   Meds ordered this encounter  Medications  . candesartan-hydrochlorothiazide (ATACAND HCT) 16-12.5 MG tablet    Sig: Take 1 tablet by mouth daily.    Dispense:  30 tablet    Refill:  5  . metFORMIN (GLUCOPHAGE-XR) 750 MG 24 hr tablet    Sig: Take 1 tablet (750 mg total) by mouth daily with breakfast.    Dispense:  30 tablet    Refill:  5  . metoprolol succinate (TOPROL-XL) 50 MG 24 hr tablet    Sig: Take 1 tablet (50 mg total) by mouth daily. Take with or immediately following a meal.    Dispense:  30 tablet    Refill:  5  . omeprazole (PRILOSEC) 20 MG capsule    Sig: Take 1 capsule (20 mg total) by  mouth daily.    Dispense:  30 capsule    Refill:  5  . rosuvastatin (CRESTOR) 40 MG tablet    Sig: Take 1 tablet (40 mg total) by mouth at bedtime.    Dispense:  30 tablet    Refill:  5   Referral Orders  No referral(s) requested today   Greater than 45 minutes was spent on patient today establishing, reviewing labs, managing multiple chronic conditions new to this provider, prescribing medications and collecting/entering patient history  Note is dictated utilizing voice recognition software. Although note has been proof read prior to signing, occasional typographical errors still can be missed. If any questions arise, please do not hesitate to call for verification.  Electronically signed by: Felix Pacini, DO [facility] [place]

## 2019-06-10 NOTE — Patient Instructions (Signed)
COVID-19 Vaccine Information can be found at: [email or web address] For questions related to vaccine distribution or appointments, please email [email or web address] or call [phone].  Covid Vaccine appointment go to [email or web address]   Great to meet you today.  I have refilled your meds for you . Follow up in 4 months for physical with fasting labs.    Please help [place] help you:  We are honored you have chosen [facility] for your Primary Care home. Below you will find basic instructions that you may need to access in the future. Please help [place] help you by reading the instructions, which cover many of the frequent questions we experience.   Prescription refills and request:  -In order to allow more efficient response time, please call your pharmacy for all refills. They will forward the request electronically to [place]. This allows for the quickest possible response. Request left on a nurse line can take longer to refill, since these are checked as time allows between office patients and other phone calls.  - refill request can take up to 3-5 working days to complete.  - If request is sent electronically and request is appropiate, it is usually completed in 1-2 business days.  - all patients will need to be seen routinely for all chronic medical conditions requiring prescription medications (see follow-up below). If you are overdue for follow up on your condition, you will be asked to make an appointment and we will call in enough medication to cover you until your appointment (up to 30 days).  - all controlled substances will require a face to face visit to request/refill.  - if you desire your prescriptions to go through a new pharmacy, and have an active script at original pharmacy, you will need to call your pharmacy and have scripts transferred to new pharmacy. This is completed between the pharmacy locations and not by  your provider.    Results: Our office handles many outgoing and incoming calls daily. If we have not contacted you within 1 week about your results, please check your mychart to see if there is a message first and if not, then contact our office.  In helping with this matter, you help decrease call volume, and therefore allow [place] to be able to respond to patients needs more efficiently.  We will always attempt to call you with results,  normal or abnormal. However, if we are unable to reach you we will send a message in your my chart with results.   Acute office visits (sick visit):  An acute visit is intended for a new problem and are scheduled in shorter time slots to allow schedule openings for patients with new problems. This is the appropriate visit to discuss a new problem. Problems will not be addressed by phone call or Echart message. Appointment is needed if requesting treatment. In order to provide you with excellent quality medical care with proper time for you to explain your problem, have an exam and receive treatment with instructions, these appointments should be limited to one new problem per visit. If you experience a new problem, in which you desire to be addressed, please make an acute office visit, we save openings on the schedule to accommodate you. Please do not save your new problem for any other type of visit, let us take care of it properly and quickly for you.   Follow up visits:  Depending on your condition(s) your provider will need to see you routinely in order to provide you with quality  care and prescribe medication(s). Most chronic conditions (Example: hypertension, Diabetes, depression/anxiety... etc), require visits a couple times a year. Your provider will instruct you on proper follow up for your personal medical conditions and history. Please make certain to make follow up appointments for your condition as instructed. Failing to do so could result in lapse in your  medication treatment/refills. If you request a refill, and are overdue to be seen on a condition, we will always provide you with a 30 day script (once) to allow you time to schedule.    Medicare wellness (well visit): - we have a wonderful Nurse Maudie Mercury), that will meet with you and provide you will yearly medicare wellness visits. These visits should occur yearly (can not be scheduled less than 1 calendar year apart) and cover preventive health, immunizations, advance directives and screenings you are entitled to yearly through your medicare benefits. Do not miss out on your entitled benefits, this is when medicare will pay for these benefits to be ordered for you.  These are strongly encouraged by your provider and is the appropriate type of visit to make certain you are up to date with all preventive health benefits. If you have not had your medicare wellness exam in the last 12 months, please make certain to schedule one by calling the office and schedule your medicare wellness with Maudie Mercury as soon as possible.   Yearly physical (well visit):  - Adults are recommended to be seen yearly for physicals. Check with your insurance and date of your last physical, most insurances require one calendar year between physicals. Physicals include all preventive health topics, screenings, medical exam and labs that are appropriate for gender/age and history. You may have fasting labs needed at this visit. This is a well visit (not a sick visit), new problems should not be covered during this visit (see acute visit).  - Pediatric patients are seen more frequently when they are younger. Your provider will advise you on well child visit timing that is appropriate for your their age. - This is not a medicare wellness visit. Medicare wellness exams do not have an exam portion to the visit. Some medicare companies allow for a physical, some do not allow a yearly physical. If your medicare allows a yearly physical you can  schedule the medicare wellness with our nurse Maudie Mercury and have your physical with your provider after, on the same day. Please check with insurance for your full benefits.   Late Policy/No Shows:  - all new patients should arrive 15-30 minutes earlier than appointment to allow [place] time  to  obtain all personal demographics,  insurance information and for you to complete office paperwork. - All established patients should arrive 10-15 minutes earlier than appointment time to update all information and be checked in .  - In our best efforts to run on time, if you are late for your appointment you will be asked to either reschedule or if able, we will work you back into the schedule. There will be a wait time to work you back in the schedule,  depending on availability.  - If you are unable to make it to your appointment as scheduled, please call 24 hours ahead of time to allow [place] to fill the time slot with someone else who needs to be seen. If you do not cancel your appointment ahead of time, you may be charged a no show fee.

## 2019-06-12 ENCOUNTER — Encounter: Payer: Self-pay | Admitting: Family Medicine

## 2019-09-27 ENCOUNTER — Other Ambulatory Visit: Payer: Self-pay | Admitting: Family Medicine

## 2019-10-16 ENCOUNTER — Encounter: Payer: 59 | Admitting: Family Medicine

## 2019-10-23 ENCOUNTER — Encounter: Payer: 59 | Admitting: Family Medicine

## 2019-11-05 ENCOUNTER — Ambulatory Visit (INDEPENDENT_AMBULATORY_CARE_PROVIDER_SITE_OTHER): Payer: 59 | Admitting: Family Medicine

## 2019-11-05 ENCOUNTER — Other Ambulatory Visit: Payer: Self-pay

## 2019-11-05 ENCOUNTER — Encounter: Payer: Self-pay | Admitting: Family Medicine

## 2019-11-05 VITALS — BP 114/69 | HR 50 | Temp 98.2°F | Resp 17 | Ht 63.0 in | Wt 197.1 lb

## 2019-11-05 DIAGNOSIS — Z6834 Body mass index (BMI) 34.0-34.9, adult: Secondary | ICD-10-CM | POA: Diagnosis not present

## 2019-11-05 DIAGNOSIS — R5383 Other fatigue: Secondary | ICD-10-CM

## 2019-11-05 DIAGNOSIS — R011 Cardiac murmur, unspecified: Secondary | ICD-10-CM

## 2019-11-05 DIAGNOSIS — Z1159 Encounter for screening for other viral diseases: Secondary | ICD-10-CM | POA: Diagnosis not present

## 2019-11-05 DIAGNOSIS — E785 Hyperlipidemia, unspecified: Secondary | ICD-10-CM | POA: Diagnosis not present

## 2019-11-05 DIAGNOSIS — Z0001 Encounter for general adult medical examination with abnormal findings: Secondary | ICD-10-CM | POA: Diagnosis not present

## 2019-11-05 DIAGNOSIS — Z23 Encounter for immunization: Secondary | ICD-10-CM

## 2019-11-05 DIAGNOSIS — E6609 Other obesity due to excess calories: Secondary | ICD-10-CM

## 2019-11-05 DIAGNOSIS — K219 Gastro-esophageal reflux disease without esophagitis: Secondary | ICD-10-CM | POA: Diagnosis not present

## 2019-11-05 DIAGNOSIS — I10 Essential (primary) hypertension: Secondary | ICD-10-CM

## 2019-11-05 DIAGNOSIS — N951 Menopausal and female climacteric states: Secondary | ICD-10-CM

## 2019-11-05 DIAGNOSIS — E119 Type 2 diabetes mellitus without complications: Secondary | ICD-10-CM

## 2019-11-05 DIAGNOSIS — R001 Bradycardia, unspecified: Secondary | ICD-10-CM

## 2019-11-05 LAB — CBC
HCT: 36.5 % (ref 36.0–46.0)
Hemoglobin: 12.1 g/dL (ref 12.0–15.0)
MCHC: 33 g/dL (ref 30.0–36.0)
MCV: 78.9 fl (ref 78.0–100.0)
Platelets: 434 10*3/uL — ABNORMAL HIGH (ref 150.0–400.0)
RBC: 4.63 Mil/uL (ref 3.87–5.11)
RDW: 16.3 % — ABNORMAL HIGH (ref 11.5–15.5)
WBC: 7.5 10*3/uL (ref 4.0–10.5)

## 2019-11-05 LAB — COMPREHENSIVE METABOLIC PANEL
ALT: 22 U/L (ref 0–35)
AST: 23 U/L (ref 0–37)
Albumin: 4.3 g/dL (ref 3.5–5.2)
Alkaline Phosphatase: 85 U/L (ref 39–117)
BUN: 21 mg/dL (ref 6–23)
CO2: 24 mEq/L (ref 19–32)
Calcium: 10.3 mg/dL (ref 8.4–10.5)
Chloride: 103 mEq/L (ref 96–112)
Creatinine, Ser: 0.6 mg/dL (ref 0.40–1.20)
GFR: 100.43 mL/min (ref >60.00)
Glucose, Bld: 104 mg/dL — ABNORMAL HIGH (ref 70–99)
Potassium: 4.2 mEq/L (ref 3.5–5.1)
Sodium: 136 mEq/L (ref 135–145)
Total Bilirubin: 0.4 mg/dL (ref 0.2–1.2)
Total Protein: 7.5 g/dL (ref 6.0–8.3)

## 2019-11-05 LAB — HEMOGLOBIN A1C: Hgb A1c MFr Bld: 7.2 % — ABNORMAL HIGH (ref 4.6–6.5)

## 2019-11-05 LAB — LIPID PANEL
Cholesterol: 123 mg/dL (ref 0–200)
HDL: 35.2 mg/dL — ABNORMAL LOW (ref >39.00)
LDL Cholesterol: 58 mg/dL (ref 0–99)
NonHDL: 87.84
Total CHOL/HDL Ratio: 3
Triglycerides: 151 mg/dL — ABNORMAL HIGH (ref 0.0–149.0)
VLDL: 30.2 mg/dL (ref 0.0–40.0)

## 2019-11-05 LAB — TSH: TSH: 1.92 u[IU]/mL (ref 0.35–4.50)

## 2019-11-05 MED ORDER — OMEPRAZOLE 20 MG PO CPDR: 20.0000 mg | DELAYED_RELEASE_CAPSULE | Freq: Every day | ORAL | 1 refills | Status: DC

## 2019-11-05 MED ORDER — ROSUVASTATIN CALCIUM 40 MG PO TABS: 40.0000 mg | ORAL_TABLET | Freq: Every day | ORAL | 1 refills | Status: DC

## 2019-11-05 MED ORDER — CANDESARTAN CILEXETIL-HCTZ 16-12.5 MG PO TABS: 1.0000 | ORAL_TABLET | Freq: Every day | ORAL | 1 refills | Status: DC

## 2019-11-05 MED ORDER — CITALOPRAM HYDROBROMIDE 10 MG PO TABS
10.0000 mg | ORAL_TABLET | Freq: Every day | ORAL | 0 refills | Status: DC
Start: 2019-11-05 — End: 2019-11-27

## 2019-11-05 MED ORDER — METFORMIN HCL ER 750 MG PO TB24: 750.0000 mg | ORAL_TABLET | Freq: Every day | ORAL | 1 refills | Status: DC

## 2019-11-05 NOTE — Patient Instructions (Addendum)
Decrease metoprolol to 1/2 tab daily.  I have ordered and echo of your heart> they will call you to schedule.  I have refilled your medications.  ~3-4 weeks follow up with provider.  October shingrix #2 by nurse visit.  4 mos for [facility]  Health Maintenance, Female Adopting a healthy lifestyle and getting preventive care are important in promoting health and wellness. Ask your health care provider about:  The right schedule for you to have regular tests and exams.  Things you can do on your own to prevent diseases and keep yourself healthy. What should I know about diet, weight, and exercise? Eat a healthy diet   Eat a diet that includes plenty of vegetables, fruits, low-fat dairy products, and lean protein.  Do not eat a lot of foods that are high in solid fats, added sugars, or sodium. Maintain a healthy weight Body mass index (BMI) is used to identify weight problems. It estimates body fat based on height and weight. Your health care provider can help determine your BMI and help you achieve or maintain a healthy weight. Get regular exercise Get regular exercise. This is one of the most important things you can do for your health. Most adults should:  Exercise for at least 150 minutes each week. The exercise should increase your heart rate and make you sweat (moderate-intensity exercise).  Do strengthening exercises at least twice a week. This is in addition to the moderate-intensity exercise.  Spend less time sitting. Even light physical activity can be beneficial. Watch cholesterol and blood lipids Have your blood tested for lipids and cholesterol at 65 years of age, then have this test every 5 years. Have your cholesterol levels checked more often if:  Your lipid or cholesterol levels are high.  You are older than 65 years of age.  You are at high risk for heart disease. What should I know about cancer screening? Depending on your health history and family history, you may  need to have cancer screening at various ages. This may include screening for:  Breast cancer.  Cervical cancer.  Colorectal cancer.  Skin cancer.  Lung cancer. What should I know about heart disease, diabetes, and high blood pressure? Blood pressure and heart disease  High blood pressure causes heart disease and increases the risk of stroke. This is more likely to develop in people who have high blood pressure readings, are of African descent, or are overweight.  Have your blood pressure checked: ? Every 3-5 years if you are 76-12 years of age. ? Every year if you are 13 years old or older. Diabetes Have regular diabetes screenings. This checks your fasting blood sugar level. Have the screening done:  Once every three years after age 43 if you are at a normal weight and have a low risk for diabetes.  More often and at a younger age if you are overweight or have a high risk for diabetes. What should I know about preventing infection? Hepatitis B If you have a higher risk for hepatitis B, you should be screened for this virus. Talk with your health care provider to find out if you are at risk for hepatitis B infection. Hepatitis C Testing is recommended for:  Everyone born from [birth year] through 1966.  Anyone with known risk factors for hepatitis C. Sexually transmitted infections (STIs)  Get screened for STIs, including gonorrhea and chlamydia, if: ? You are sexually active and are younger than 65 years of age. ? You are older than 65  years of age and your health care provider tells you that you are at risk for this type of infection. ? Your sexual activity has changed since you were last screened, and you are at increased risk for chlamydia or gonorrhea. Ask your health care provider if you are at risk.  Ask your health care provider about whether you are at high risk for HIV. Your health care provider may recommend a prescription medicine to help prevent HIV infection. If you  choose to take medicine to prevent HIV, you should first get tested for HIV. You should then be tested every 3 months for as long as you are taking the medicine. Pregnancy  If you are about to stop having your period (premenopausal) and you may become pregnant, seek counseling before you get pregnant.  Take 400 to 800 micrograms (mcg) of folic acid every day if you become pregnant.  Ask for birth control (contraception) if you want to prevent pregnancy. Osteoporosis and menopause Osteoporosis is a disease in which the bones lose minerals and strength with aging. This can result in bone fractures. If you are 64 years old or older, or if you are at risk for osteoporosis and fractures, ask your health care provider if you should:  Be screened for bone loss.  Take a calcium or vitamin D supplement to lower your risk of fractures.  Be given hormone replacement therapy (HRT) to treat symptoms of menopause. Follow these instructions at home: Lifestyle  Do not use any products that contain nicotine or tobacco, such as cigarettes, e-cigarettes, and chewing tobacco. If you need help quitting, ask your health care provider.  Do not use street drugs.  Do not share needles.  Ask your health care provider for help if you need support or information about quitting drugs. Alcohol use  Do not drink alcohol if: ? Your health care provider tells you not to drink. ? You are pregnant, may be pregnant, or are planning to become pregnant.  If you drink alcohol: ? Limit how much you use to 0-1 drink a day. ? Limit intake if you are breastfeeding.  Be aware of how much alcohol is in your drink. In the [place] one drink equals one 12 oz bottle of beer (355 mL), one 5 oz glass of wine (148 mL), or one 1 oz glass of hard liquor (44 mL). General instructions  Schedule regular health, dental, and eye exams.  Stay current with your vaccines.  Tell your health care provider if: ? You often feel  depressed. ? You have ever been abused or do not feel safe at home. Summary  Adopting a healthy lifestyle and getting preventive care are important in promoting health and wellness.  Follow your health care provider's instructions about healthy diet, exercising, and getting tested or screened for diseases.  Follow your health care provider's instructions on monitoring your cholesterol and blood pressure. This information is not intended to replace advice given to you by your health care provider. Make sure you discuss any questions you have with your health care provider. Document Revised: 03/21/2018 Document Reviewed: 03/21/2018 Elsevier Patient Education  2020 [employer].

## 2019-11-05 NOTE — Progress Notes (Signed)
This visit occurred during the SARS-CoV-2 public health emergency.  Safety protocols were in place, including screening questions prior to the visit, additional usage of staff PPE, and extensive cleaning of exam room while observing appropriate contact time as indicated for disinfecting solutions.    Patient ID: Carol [name], female  DOB: [birth year], 64 y.o.   MRN: [id number] Patient Care Team    Relationship Specialty Notifications Start End  Natalia LeatherwoodKuneff, Bohdi Leeds A, DO PCP - General Family Medicine  06/10/19   Jake BatheSkains, Mark C, MD Consulting Physician Cardiology  06/12/19   Iva BoopGessner, Carl E, MD Consulting Physician Gastroenterology  06/12/19   Elwin MochaShah, Christopher Tulip, MD Consulting Physician Ophthalmology  06/12/19   Frankey Pootamsay, Laura, MD Referring Physician Obstetrics and Gynecology  06/12/19   Jessica PriestKozlow, Eric J, MD Consulting Physician Allergy and Immunology  06/12/19     Chief Complaint  Patient presents with  . Annual Exam    Fasting. Pap smear around 4 yrs ago. Mammogram- 2020.     Subjective:  Carol SnareJill D [name] is a 64 y.o.  Female  present for CPE. All past medical history, surgical history, allergies, family history, immunizations, medications and social history were updated in the electronic medical record today. All recent labs, ED visits and hospitalizations within the last year were reviewed.  Health maintenance:  Colonoscopy: completed 03/2019-10-year follow-up.  Dr. Leone PayorGessner. Mammogram: completed: 11/26/2018 by gynecology.  Patient desires every 2 years Cervical cancer screening: Hysterectomy Immunizations: tdap UTD 2014, Influenza UTD 2020 (encouraged yearly), PNA series for complete series after 64 again, Pfizer Covid vaccines completed.  Shingrix No. 1 provided today.  Visit in 2-6 months for second dose. Infectious disease screening: HIV completed, Hep C completed DEXA: If not completed by gynecological office would offer her next year with her mammogram Assistive device: None Oxygen use:  None Patient has a Dental home. Hospitalizations/ED visits: Viewed  Type 2 diabetes mellitus without complication, without long-term current use of insulin (HCC)/obesity Pt reports compliance with metformin 750 mg daily. Patient denies dizziness, hyperglycemic or hypoglycemic events. Patient denies numbness, tingling in the extremities or nonhealing wounds of feet.  Pt reports BG ranges  have been running higher with a fasting blood glucose of 185 and the highest random blood glucose of 205 3 hours after her dinner. PNA series: Unknown if completed.  Start after her 64th birthday in November. Flu shot: Up-to-date 2020 (recommneded yearly) Foot exam:  Completed 10/2019 Eye exam: Up-to-date 01/2019, Dr. Sherryll BurgerShah A1c: 6.9> 6.1> collected today  Essential hypertension/HTN Pt reports compliance with Toprol-XL 50, candesartan-HCTZ 16-12.5 mg daily, Crestor 40 mg daily and baby aspirin. Blood pressures ranges at home within normal limits. Patient denies chest pain, shortness of breath, dizziness or lower extremity edema.   Pt takes a daily baby ASA. Pt is  prescribed statin. RF: Hypertension, hyperlipidemia, diabetic, obesity, family history.   Gastroesophageal reflux disease without esophagitis Patient reports symptoms are well controlled on her omeprazole 20 mg daily.  She has been unable to discontinue medication in the past without recurrence of symptoms immediately.  Hot flashes: Patient complains of hot flashes.  She had been on hormone replacement in the past and it was discontinued secondary to benefits versus risks with age.  She is interested in other possible medications to help treat hot flashes.  [facility] 11/05/2019 06/10/2019  Decreased Interest 0 0  Down, Depressed, Hopeless 0 0  PHQ - 2 Score 0 0   No flowsheet data found.   Immunization History  Administered Date(s) Administered  . Influenza,inj,Quad PF,6+ Mos 03/30/2019  . PFIZER SARS-COV-2 Vaccination  07/02/2019, 07/30/2019  . Tdap 08/27/2012  . Zoster Recombinat (Shingrix) 11/05/2019   Past Medical History:  Diagnosis Date  . Angio-edema   . Chest discomfort    , Atypical  . Chicken pox   . COVID-19 05/09/2019  . Diabetic acidosis, type II (HCC)   . Diverticulitis - large vs small bowel vs both?   . Gastroesophageal reflux disease   . Glaucoma   . Hyperlipidemia   . Hypertension   . PVC (premature ventricular contraction)   . Systolic dysfunction    , Hyperdynamic   Allergies  Allergen Reactions  . Ivp Dye [Iodinated Diagnostic Agents] Hives and Shortness Of Breath    Did ok in OP setting with 13 hr Premedications   . Latanoprost   . Lisinopril Cough  . Onion Rash    Rash and GI upset  . Sulfa Antibiotics Hives   Past Surgical History:  Procedure Laterality Date  . APPENDECTOMY N/A 11/05/2013   Procedure: APPENDECTOMY;  Surgeon: Cherylynn Ridges, MD;  [facility];  Service: General;  Laterality: N/A;  . 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 COLONOSCOPY  04/03/2019  . LAPAROTOMY N/A 11/05/2013   Procedure: EXPLORATORY LAPAROTOMY;  Surgeon: Cherylynn Ridges, MD;  Location: [facility] OR;  Service: General;  Laterality: N/A;  . TOTAL ABDOMINAL HYSTERECTOMY  2004   Family History  Problem Relation Age of Onset  . Heart disease Mother   . Heart attack Mother   . Asthma Mother   . Early death Mother   . Miscarriages / [place] Mother   . Heart disease Father   . Lung cancer Father   . Heart attack Brother   . Asthma Brother   . Heart disease Brother   . Kidney disease Brother   . Diverticulitis Sister   . Heart disease Brother   . Heart attack Brother   . Heart disease Brother   . Heart attack Brother   . Early death Maternal Grandmother   . Tuberculosis Maternal Grandmother   . Depression Maternal Grandfather   . Heart disease Paternal Grandmother   . Heart disease Sister   . Colon cancer Neg Hx   . Esophageal cancer Neg Hx   .  Rectal cancer Neg Hx   . Stomach cancer Neg Hx    Social History   Social History Narrative   Marital status/children/pets: Married.   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   Safety:      -smoke alarm in the home:Yes     - wears seatbelt: Yes     - Feels safe in their relationships: Yes    Allergies as of 11/05/2019      Reactions   Ivp Dye [iodinated Diagnostic Agents] Hives, Shortness Of Breath   Did ok in OP setting with 13 hr Premedications    Latanoprost    Lisinopril Cough   Onion Rash   Rash and GI upset   Sulfa Antibiotics Hives      Medication List       Accurate as of November 05, 2019 11:59 PM. If you have any questions, ask your nurse or doctor.        STOP taking these medications   estradiol 0.025 mg/24hr patch Commonly known as: CLIMARA - Dosed in mg/24 hr Stopped by: Felix Pacini, DO     TAKE these medications  aspirin 81 MG tablet Take 81 mg by mouth at bedtime.   candesartan-hydrochlorothiazide 16-12.5 MG tablet Commonly known as: ATACAND HCT Take 1 tablet by mouth daily.   cetirizine 10 MG tablet Commonly known as: ZYRTEC Take 10 mg by mouth at bedtime.   citalopram 10 MG tablet Commonly known as: CeleXA Take 1 tablet (10 mg total) by mouth daily. Started by: Felix Pacini, DO   clobetasol cream 0.05 % Commonly known as: TEMOVATE Apply 1 application topically 2 (two) times daily. PRN   metFORMIN 750 MG 24 hr tablet Commonly known as: GLUCOPHAGE-XR Take 1 tablet (750 mg total) by mouth daily with breakfast.   metoprolol succinate 50 MG 24 hr tablet Commonly known as: TOPROL-XL Take 1 tablet (50 mg total) by mouth daily. Take with or immediately following a meal.   omeprazole 20 MG capsule Commonly known as: PRILOSEC Take 1 capsule (20 mg total) by mouth daily.   OneTouch Delica Plus Lancet33G Misc check blood sugar TWICE DAILY   OneTouch Ultra test strip Generic drug: glucose blood test  sugars TWICE DAILY as directed   rosuvastatin 40 MG tablet Commonly known as: CRESTOR Take 1 tablet (40 mg total) by mouth at bedtime.   Timolol Maleate 0.5 % (DAILY) Soln Place 1 drop into both eyes 2 (two) times daily.   Travoprost (BAK Free) 0.004 % Soln ophthalmic solution Commonly known as: TRAVATAN Place 1 drop into both eyes at bedtime.       All past medical history, surgical history, allergies, family history, immunizations andmedications were updated in the EMR today and reviewed under the history and medication portions of their EMR.     Recent Results (from the past 2160 hour(s))  CBC     Status: Abnormal   Collection Time: 11/05/19 10:13 AM  Result Value Ref Range   WBC 7.5 4.0 - 10.5 K/uL   RBC 4.63 3.87 - 5.11 Mil/uL   Platelets 434.0 (H) 150 - 400 K/uL   Hemoglobin 12.1 12.0 - 15.0 g/dL   HCT 16.1 36 - 46 %   MCV 78.9 78.0 - 100.0 fl   MCHC 33.0 30.0 - 36.0 g/dL   RDW 09.6 (H) 04.5 - 40.9 %  Comprehensive metabolic panel     Status: Abnormal   Collection Time: 11/05/19 10:13 AM  Result Value Ref Range   Sodium 136 135 - 145 mEq/L   Potassium 4.2 3.5 - 5.1 mEq/L   Chloride 103 96 - 112 mEq/L   CO2 24 19 - 32 mEq/L   Glucose, Bld 104 (H) 70 - 99 mg/dL   BUN 21 6 - 23 mg/dL   Creatinine, Ser 8.11 0.40 - 1.20 mg/dL   Total Bilirubin 0.4 0.2 - 1.2 mg/dL   Alkaline Phosphatase 85 39 - 117 U/L   AST 23 0 - 37 U/L   ALT 22 0 - 35 U/L   Total Protein 7.5 6.0 - 8.3 g/dL   Albumin 4.3 3.5 - 5.2 g/dL   GFR 914.78 >29.56 mL/min   Calcium 10.3 8.4 - 10.5 mg/dL  Hemoglobin O1H     Status: Abnormal   Collection Time: 11/05/19 10:13 AM  Result Value Ref Range   Hgb A1c MFr Bld 7.2 (H) 4.6 - 6.5 %    Comment: Glycemic Control Guidelines for People with Diabetes:Non Diabetic:  <6%Goal of Therapy: <7%Additional Action Suggested:  >8%   Lipid panel     Status: Abnormal   Collection Time: 11/05/19 10:13 AM  Result Value Ref  Range   Cholesterol 123 0 - 200 mg/dL     Comment: ATP III Classification       Desirable:  < 200 mg/dL               Borderline High:  200 - 239 mg/dL          High:  > = 229 mg/dL   Triglycerides 798.9 (H) 0 - 149 mg/dL    Comment: Normal:  <211 mg/dLBorderline High:  150 - 199 mg/dL   HDL 94.17 (L) >40.81 mg/dL   VLDL 44.8 0.0 - 18.5 mg/dL   LDL Cholesterol 58 0 - 99 mg/dL   Total CHOL/HDL Ratio 3     Comment:                Men          Women1/2 Average Risk     3.4          3.3Average Risk          5.0          4.42X Average Risk          9.6          7.13X Average Risk          15.0          11.0                       NonHDL 87.84     Comment: NOTE:  Non-HDL goal should be 30 mg/dL higher than patient's LDL goal (i.e. LDL goal of < 70 mg/dL, would have non-HDL goal of < 100 mg/dL)  TSH     Status: None   Collection Time: 11/05/19 10:13 AM  Result Value Ref Range   TSH 1.92 0.35 - 4.50 uIU/mL  Hepatitis C Antibody     Status: None   Collection Time: 11/05/19 10:13 AM  Result Value Ref Range   Hepatitis C Ab NON-REACTIVE NON-REACTI   SIGNAL TO CUT-OFF 0.01 <1.00    Comment: . HCV antibody was non-reactive. There is no laboratory  evidence of HCV infection. . In most cases, no further action is required. However, if recent HCV exposure is suspected, a test for HCV RNA (test code 63149) is suggested. . For additional information please refer to http://education.[facility].com/faq/FAQ22v1 (This link is being provided for informational/ educational purposes only.) .      ROS: 14 pt review of systems performed and negative (unless mentioned in an HPI)  Objective: BP 114/69 (BP Location: Left Arm, Patient Position: Sitting, Cuff Size: Large)   Pulse 50   Temp 98.2 F (36.8 C) (Temporal)   Resp 17   Ht 5\' 3"  (1.6 m)   Wt 197 lb 2 oz (89.4 kg)   SpO2 97%   BMI 34.92 kg/m  Gen: Afebrile. No acute distress. Nontoxic in appearance, well-developed, well-nourished, pleasant, obese female HENT: AT. [place]. Bilateral  TM visualized and normal in appearance, normal external auditory canal. MMM, no oral lesions, adequate dentition. Bilateral nares within normal limits. Throat without erythema, ulcerations or exudates.  No cough on exam, no hoarseness on exam. Eyes:Pupils Equal Round Reactive to light, Extraocular movements intact,  Conjunctiva without redness, discharge or icterus. Neck/lymp/endocrine: Supple, no lymphadenopathy, no thyromegaly CV: RRR 1/6 systolic murmur, no edema, +2/4 P posterior tibialis pulses.  Chest: CTAB, no wheeze, rhonchi or crackles.  Normal respiratory effort.  Good air movement. Abd: Soft.  Flat. NTND. BS present.  No masses palpated. No hepatosplenomegaly. No rebound tenderness or guarding. Skin: No rashes, purpura or petechiae. Warm and well-perfused. Skin intact. Neuro/Msk:  Normal gait. PERLA. EOMi. Alert. Oriented x3.  Cranial nerves II through XII intact. Muscle strength 5/5 upper/lower extremity. DTRs equal bilaterally. Psych: Normal affect, dress and demeanor. Normal speech. Normal thought content and judgment.   No exam data present  Assessment/plan: Carol [name] is a 64 y.o. female present for CPE  Type 2 diabetes mellitus without complication, without long-term current use of insulin (HCC)/obesity Sugars have been running higher than her usual.  A1c collected today with her lab work and will add second agent if necessary. Continue Metformin 750 mg daily Continue monitoring blood glucose daily -PNA series: Unknown if completed.  Start after her 64th birthday in November. Flu shot: Up-to-date 2020 (recommneded yearly) Foot exam:  Completed 10/2019 Eye exam: Up-to-date 01/2019, Dr. Sherryll Burger A1c: 6.9> 6.1> collected today -Follow-up every 4 months  Essential hypertension/HLD/obesity Stable blood pressure, heart rate in the 50s with symptoms of fatigue. Continue metoprolol XL at a decreased dose of half tab daily (25 mg dose) Continue candesartan/HCTZ 16-12.5  mg Continue Crestor 40 mg daily Continue baby aspirin daily CBC, CMP, TSH, lipid panel collected today Follow-up every 4 months with chronic conditions  Gastroesophageal reflux disease without esophagitis -Stable -Continue omeprazole 20 mg daily  Need for hepatitis C screening test - Hepatitis C Antibody Need for shingles vaccine Nurse visit 2-6 months for Shingrix No. 2 administration - Varicella-zoster vaccine IM  Murmur, cardiac/fatigue Newly recognized murmur with history of fatigue will obtain echocardiogram. - ECHOCARDIOGRAM COMPLETE; Future Hot flashes: Discussed options with her today and she would like to try Celexa 10 mg daily. Follow-up in 3 to 4 weeks on hot flashes, can taper up on medication at that time if necessary.  Preventative exam: Patient was encouraged to exercise greater than 150 minutes a week. Patient was encouraged to choose a diet filled with fresh fruits and vegetables, and lean meats. AVS provided to patient today for education/recommendation on gender specific health and safety maintenance. Colonoscopy: completed 03/2019-10-year follow-up.  Dr. Leone Payor. Mammogram: completed: 11/26/2018 by gynecology.  Patient desires every 2 years Cervical cancer screening: Hysterectomy Immunizations: tdap UTD 2014, Influenza UTD 2020 (encouraged yearly), PNA series for complete series after 64 again, Pfizer Covid vaccines completed.  Shingrix No. 1 provided today.  Visit in 2-6 months for second dose. Infectious disease screening: HIV completed, Hep C completed DEXA: If not completed by gynecological office would offer her next year with her mammogram  Return in about 4 months (around 03/07/2020) for CMC (30 min) and cpe 1 yr. Orders Placed This Encounter  Procedures  . Varicella-zoster vaccine IM  . CBC  . Comprehensive metabolic panel  . Hemoglobin A1c  . Lipid panel  . TSH  . Hepatitis C Antibody  . ECHOCARDIOGRAM COMPLETE   Meds ordered this encounter   Medications  . omeprazole (PRILOSEC) 20 MG capsule    Sig: Take 1 capsule (20 mg total) by mouth daily.    Dispense:  90 capsule    Refill:  1  . rosuvastatin (CRESTOR) 40 MG tablet    Sig: Take 1 tablet (40 mg total) by mouth at bedtime.    Dispense:  90 tablet    Refill:  1  . metFORMIN (GLUCOPHAGE-XR) 750 MG 24 hr tablet    Sig: Take 1 tablet (750 mg total) by mouth daily with breakfast.    Dispense:  90 tablet  Refill:  1  . candesartan-hydrochlorothiazide (ATACAND HCT) 16-12.5 MG tablet    Sig: Take 1 tablet by mouth daily.    Dispense:  90 tablet    Refill:  1  . citalopram (CELEXA) 10 MG tablet    Sig: Take 1 tablet (10 mg total) by mouth daily.    Dispense:  90 tablet    Refill:  0   Referral Orders  No referral(s) requested today   Preventative exam/encounter completed today with an additional > 30 Minutes was dedicated to this patient's encounter to include coverage of multiple chronic conditions, greater than 1 new problems, pre-visit review of chart, face-to-face time with patient and post-visit work- which include documentation and prescribing medications and/or ordering test when necessary.     Electronically signed by: Felix Pacini, DO  [facility] [place]

## 2019-11-06 ENCOUNTER — Telehealth: Payer: Self-pay | Admitting: Family Medicine

## 2019-11-06 DIAGNOSIS — N951 Menopausal and female climacteric states: Secondary | ICD-10-CM | POA: Insufficient documentation

## 2019-11-06 LAB — HEPATITIS C ANTIBODY
Hepatitis C Ab: NONREACTIVE
SIGNAL TO CUT-OFF: 0.01 (ref <1.00)

## 2019-11-06 MED ORDER — SITAGLIPTIN PHOSPHATE 25 MG PO TABS
25.0000 mg | ORAL_TABLET | Freq: Every day | ORAL | 5 refills | Status: DC
Start: 2019-11-06 — End: 2019-11-06

## 2019-11-06 MED ORDER — LINAGLIPTIN 5 MG PO TABS: 5.0000 mg | ORAL_TABLET | Freq: Every day | ORAL | 5 refills | Status: DC

## 2019-11-06 NOTE — Telephone Encounter (Signed)
Received fax that Carol [name] is not covered by insurance with a list of covered medications.   Placed on Dr Blenda Bridegroom desk to review.

## 2019-11-06 NOTE — Telephone Encounter (Signed)
Please inform patient the first medication called Januvia was not covered by her insurance.  I have called in a different medication called Tradjenta which seems to be on her formulary. She is to continue the Metformin and started Tradjenta. Please also make sure she received her results  Thanks.

## 2019-11-06 NOTE — Telephone Encounter (Signed)
Pt was called and there was no answer. My chart message sent with results.

## 2019-11-06 NOTE — Telephone Encounter (Signed)
Please inform patient the following information: Liver, kidney and thyroid function are normal Blood cell counts and electrolytes are stable Cholesterol panel looks good and is at goal With a total cholesterol of 123, HDL 35, LDL 58. Diabetes screening/A1c is higher than prior which was 6.1, now at 7.2.     -Continue the Metformin once daily     -Added a medication called Januvia at a low dose, also taken once daily.      - Both these medications are 24-hour formulations, meaning the only need to be taken once daily and they can be taken together if desired.  The echocardiogram of her heart has been ordered and they will call her to get her scheduled.

## 2019-11-06 NOTE — Addendum Note (Signed)
Addended by: Felix Pacini A on: 11/06/2019 05:03 PM   Modules accepted: Orders

## 2019-11-07 NOTE — Telephone Encounter (Signed)
Pt was called given information. Phone number was updated. She verbalized understanding

## 2019-11-22 ENCOUNTER — Other Ambulatory Visit (HOSPITAL_COMMUNITY): Payer: Self-pay | Admitting: *Deleted

## 2019-11-25 ENCOUNTER — Ambulatory Visit (HOSPITAL_COMMUNITY)
Admission: RE | Admit: 2019-11-25 | Discharge: 2019-11-25 | Disposition: A | Discharge status: Home / Self Care | Payer: 59 | Source: Ambulatory Visit | Attending: Family Medicine | Admitting: Family Medicine

## 2019-11-25 ENCOUNTER — Other Ambulatory Visit: Payer: Self-pay

## 2019-11-25 ENCOUNTER — Telehealth: Payer: Self-pay | Admitting: Family Medicine

## 2019-11-25 DIAGNOSIS — R011 Cardiac murmur, unspecified: Secondary | ICD-10-CM | POA: Insufficient documentation

## 2019-11-25 DIAGNOSIS — R5383 Other fatigue: Secondary | ICD-10-CM | POA: Insufficient documentation

## 2019-11-25 DIAGNOSIS — E119 Type 2 diabetes mellitus without complications: Secondary | ICD-10-CM | POA: Insufficient documentation

## 2019-11-25 DIAGNOSIS — R001 Bradycardia, unspecified: Secondary | ICD-10-CM | POA: Diagnosis not present

## 2019-11-25 DIAGNOSIS — I10 Essential (primary) hypertension: Secondary | ICD-10-CM | POA: Insufficient documentation

## 2019-11-25 DIAGNOSIS — E785 Hyperlipidemia, unspecified: Secondary | ICD-10-CM | POA: Diagnosis not present

## 2019-11-25 LAB — ECHOCARDIOGRAM COMPLETE
Area-P 1/2: 3.12 cm2
Calc EF: 74.7 %
S' Lateral: 2.2 cm
Single Plane A2C EF: 74.8 %
Single Plane A4C EF: 73.8 %

## 2019-11-25 NOTE — Telephone Encounter (Signed)
Please inform patient the following information: Her echocardiogram overall looks good.  There are no concerns over her results.   She has great squeezing function of the heart and the mildly impaired relaxation of the heart.  This is a rather common finding with age. Her heart valves are all normal with only a very mild leakage around the mitral valve.

## 2019-11-25 NOTE — Progress Notes (Signed)
  Echocardiogram 2D Echocardiogram has been performed.  Carol [name] 11/25/2019, 9:41 AM

## 2019-11-25 NOTE — Telephone Encounter (Signed)
Patient aware of results.

## 2019-11-27 ENCOUNTER — Encounter: Payer: Self-pay | Admitting: Family Medicine

## 2019-11-27 ENCOUNTER — Other Ambulatory Visit: Payer: Self-pay

## 2019-11-27 ENCOUNTER — Ambulatory Visit (INDEPENDENT_AMBULATORY_CARE_PROVIDER_SITE_OTHER): Payer: 59 | Admitting: Family Medicine

## 2019-11-27 VITALS — BP 103/55 | HR 58 | Temp 98.0°F | Resp 16 | Ht 63.0 in | Wt 196.0 lb

## 2019-11-27 DIAGNOSIS — N951 Menopausal and female climacteric states: Secondary | ICD-10-CM | POA: Diagnosis not present

## 2019-11-27 DIAGNOSIS — Z1231 Encounter for screening mammogram for malignant neoplasm of breast: Secondary | ICD-10-CM

## 2019-11-27 DIAGNOSIS — E2839 Other primary ovarian failure: Secondary | ICD-10-CM | POA: Diagnosis not present

## 2019-11-27 MED ORDER — CITALOPRAM HYDROBROMIDE 20 MG PO TABS: 20.0000 mg | ORAL_TABLET | Freq: Every day | ORAL | 1 refills | Status: DC

## 2019-11-27 NOTE — Patient Instructions (Addendum)
We increased celexa to 20 mg a day. You can finish the bottle of celexa  You currently have by taking two of the celexa 10 mg tabs. The new script will be celexa 20 mg tabs (then only take 1).    Ordered you bone density and mammogram they will call you to schedule.    Menopause Menopause is the normal time of life when menstrual periods stop completely. It is usually confirmed by 12 months without a menstrual period. The transition to menopause (perimenopause) most often happens between the ages of 41 and 54. During perimenopause, hormone levels change in your body, which can cause symptoms and affect your health. Menopause may increase your risk for:  Loss of bone (osteoporosis), which causes bone breaks (fractures).  Depression.  Hardening and narrowing of the arteries (atherosclerosis), which can cause heart attacks and strokes. What are the causes? This condition is usually caused by a natural change in hormone levels that happens as you get older. The condition may also be caused by surgery to remove both ovaries (bilateral oophorectomy). What increases the risk? This condition is more likely to start at an earlier age if you have certain medical conditions or treatments, including:  A tumor of the pituitary gland in the brain.  A disease that affects the ovaries and hormone production.  Radiation treatment for cancer.  Certain cancer treatments, such as chemotherapy or hormone (anti-estrogen) therapy.  Heavy smoking and excessive alcohol use.  Family history of early menopause. This condition is also more likely to develop earlier in women who are very thin. What are the signs or symptoms? Symptoms of this condition include:  Hot flashes.  Irregular menstrual periods.  Night sweats.  Changes in feelings about sex. This could be a decrease in sex drive or an increased comfort around your sexuality.  Vaginal dryness and thinning of the vaginal walls. This may cause  painful intercourse.  Dryness of the skin and development of wrinkles.  Headaches.  Problems sleeping (insomnia).  Mood swings or irritability.  Memory problems.  Weight gain.  Hair growth on the face and chest.  Bladder infections or problems with urinating. How is this diagnosed? This condition is diagnosed based on your medical history, a physical exam, your age, your menstrual history, and your symptoms. Hormone tests may also be done. How is this treated? In some cases, no treatment is needed. You and your health care provider should make a decision together about whether treatment is necessary. Treatment will be based on your individual condition and preferences. Treatment for this condition focuses on managing symptoms. Treatment may include:  Menopausal hormone therapy (MHT).  Medicines to treat specific symptoms or complications.  Acupuncture.  Vitamin or herbal supplements. Before starting treatment, make sure to let your health care provider know if you have a personal or family history of:  Heart disease.  Breast cancer.  Blood clots.  Diabetes.  Osteoporosis. Follow these instructions at home: Lifestyle  Do not use any products that contain nicotine or tobacco, such as cigarettes and e-cigarettes. If you need help quitting, ask your health care provider.  Get at least 30 minutes of physical activity on 5 or more days each week.  Avoid alcoholic and caffeinated beverages, as well as spicy foods. This may help prevent hot flashes.  Get 7-8 hours of sleep each night.  If you have hot flashes, try: ? Dressing in layers. ? Avoiding things that may trigger hot flashes, such as spicy food, warm places, or  stress. ? Taking slow, deep breaths when a hot flash starts. ? Keeping a fan in your home and office.  Find ways to manage stress, such as deep breathing, meditation, or journaling.  Consider going to group therapy with other women who are having  menopause symptoms. Ask your health care provider about recommended group therapy meetings. Eating and drinking  Eat a healthy, balanced diet that contains whole grains, lean protein, low-fat dairy, and plenty of fruits and vegetables.  Your health care provider may recommend adding more soy to your diet. Foods that contain soy include tofu, tempeh, and soy milk.  Eat plenty of foods that contain calcium and vitamin D for bone health. Items that are rich in calcium include low-fat milk, yogurt, beans, almonds, sardines, broccoli, and kale. Medicines  Take over-the-counter and prescription medicines only as told by your health care provider.  Talk with your health care provider before starting any herbal supplements. If prescribed, take vitamins and supplements as told by your health care provider. These may include: ? Calcium. Women age 31 and older should get 1,200 mg (milligrams) of calcium every day. ? Vitamin D. Women need 600-800 International Units of vitamin D each day. ? Vitamins B12 and B6. Aim for 50 micrograms of B12 and 1.5 mg of B6 each day. General instructions  Keep track of your menstrual periods, including: ? When they occur. ? How heavy they are and how long they last. ? How much time passes between periods.  Keep track of your symptoms, noting when they start, how often you have them, and how long they last.  Use vaginal lubricants or moisturizers to help with vaginal dryness and improve comfort during sex.  Keep all follow-up visits as told by your health care provider. This is important. This includes any group therapy or counseling. Contact a health care provider if:  You are still having menstrual periods after age 51.  You have pain during sex.  You have not had a period for 12 months and you develop vaginal bleeding. Get help right away if:  You have: ? Severe depression. ? Excessive vaginal bleeding. ? Pain when you urinate. ? A fast or irregular  heart beat (palpitations). ? Severe headaches. ? Abdomen (abdominal) pain or severe indigestion.  You fell and you think you have a broken bone.  You develop leg or chest pain.  You develop vision problems.  You feel a lump in your breast. Summary  Menopause is the normal time of life when menstrual periods stop completely. It is usually confirmed by 12 months without a menstrual period.  The transition to menopause (perimenopause) most often happens between the ages of 29 and 41.  Symptoms can be managed through medicines, lifestyle changes, and complementary therapies such as acupuncture.  Eat a balanced diet that is rich in nutrients to promote bone health and heart health and to manage symptoms during menopause. This information is not intended to replace advice given to you by your health care provider. Make sure you discuss any questions you have with your health care provider. Document Revised: 03/10/2017 Document Reviewed: 04/30/2016 Elsevier Patient Education  2020 [employer].

## 2019-11-27 NOTE — Progress Notes (Signed)
This visit occurred during the SARS-CoV-2 public health emergency.  Safety protocols were in place, including screening questions prior to the visit, additional usage of staff PPE, and extensive cleaning of exam room while observing appropriate contact time as indicated for disinfecting solutions.    Carol [name] , 12/17/1955, 64 y.o., female MRN: [id number] Patient Care Team    Relationship Specialty Notifications Start End  Natalia Leatherwood, DO PCP - General Family Medicine  06/10/19   Jake Bathe, MD Consulting Physician Cardiology  06/12/19   Iva Boop, MD Consulting Physician Gastroenterology  06/12/19   Elwin Mocha, MD Consulting Physician Ophthalmology  06/12/19   Frankey Poot, MD Referring Physician Obstetrics and Gynecology  06/12/19   Jessica Priest, MD Consulting Physician Allergy and Immunology  06/12/19     Chief Complaint  Patient presents with   Follow-up    hot flashes, was started on Celexa 10mg . She is better during the day but still having issues at night     Subjective: Pt presents for follow-up on hot flashes.  After patient seen last visit she was started on Celexa 10 mg daily.  She reports her symptoms have improved throughout the day, however she still having rather significant hot flashes at night.  Of note she is tolerating the addition of Tradjenta.  Depression screen [facility] 2/9 11/05/2019 06/10/2019  Decreased Interest 0 0  Down, Depressed, Hopeless 0 0  PHQ - 2 Score 0 0    Allergies  Allergen Reactions   Ivp Dye [Iodinated Diagnostic Agents] Hives and Shortness Of Breath    Did ok in OP setting with 13 hr Premedications    Latanoprost    Lisinopril Cough   Onion Rash    Rash and GI upset   Sulfa Antibiotics Hives   Social History   Social History Narrative   Marital status/children/pets: Married.   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   Safety:      -smoke alarm in the home:Yes     -  wears seatbelt: Yes     - Feels safe in their relationships: Yes   Past Medical History:  Diagnosis Date   Angio-edema    Chest discomfort    , Atypical   Chicken pox    COVID-19 05/09/2019   Diabetic acidosis, type II (HCC)    Diverticulitis - large vs small bowel vs both?    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  "Normal "performed by Dr. 2011   COLONOSCOPY  04/03/2019   LAPAROTOMY N/A 11/05/2013   Procedure: EXPLORATORY LAPAROTOMY;  Surgeon: 11/07/2013, MD;  Location: [facility] OR;  Service: General;  Laterality: N/A;   Lexiscan  2010   Patient reports normal Lexiscan completed around 2010 by Dr. 2011.   TOTAL ABDOMINAL HYSTERECTOMY  2004   Family History  Problem Relation Age of Onset   Heart disease Mother    Heart attack Mother    Asthma  Mother    Early death Mother    Miscarriages / Stillbirths Mother    Heart disease Father    Lung cancer Father    Heart attack Brother    Asthma Brother    Heart disease Brother    Kidney disease Brother    Diverticulitis Sister    Heart disease Brother    Heart attack Brother    Heart disease Brother    Heart attack Brother    Early death Maternal Grandmother    Tuberculosis Maternal Grandmother    Depression Maternal Grandfather    Heart disease Paternal Grandmother    Heart disease Sister    Colon cancer Neg Hx    Esophageal cancer Neg Hx    Rectal cancer Neg Hx    Stomach cancer Neg Hx    Allergies as of  11/27/2019      Reactions   Ivp Dye [iodinated Diagnostic Agents] Hives, Shortness Of Breath   Did ok in OP setting with 13 hr Premedications    Latanoprost    Lisinopril Cough   Onion Rash   Rash and GI upset   Sulfa Antibiotics Hives      Medication List       Accurate as of November 27, 2019  6:50 PM. If you have any questions, ask your nurse or doctor.        aspirin 81 MG tablet Take 81 mg by mouth at bedtime.   candesartan-hydrochlorothiazide 16-12.5 MG tablet Commonly known as: ATACAND HCT Take 1 tablet by mouth daily.   cetirizine 10 MG tablet Commonly known as: ZYRTEC Take 10 mg by mouth at bedtime.   citalopram 20 MG tablet Commonly known as: CeleXA Take 1 tablet (20 mg total) by mouth daily. What changed:   medication strength  how much to take Changed by: Felix Pacini, DO   clobetasol cream 0.05 % Commonly known as: TEMOVATE Apply 1 application topically 2 (two) times daily. PRN   linagliptin 5 MG Tabs tablet Commonly known as: TRADJENTA Take 1 tablet (5 mg total) by mouth daily.   metFORMIN 750 MG 24 hr tablet Commonly known as: GLUCOPHAGE-XR Take 1 tablet (750 mg total) by mouth daily with breakfast.   metoprolol succinate 50 MG 24 hr tablet Commonly known as: TOPROL-XL Take 1 tablet (50 mg total) by mouth daily. Take with or immediately following a meal.   omeprazole 20 MG capsule Commonly known as: PRILOSEC Take 1 capsule (20 mg total) by mouth daily.   OneTouch Delica Plus Lancet33G Misc check blood sugar TWICE DAILY   OneTouch Ultra test strip Generic drug: glucose blood test sugars TWICE DAILY as directed   rosuvastatin 40 MG tablet Commonly known as: CRESTOR Take 1 tablet (40 mg total) by mouth at bedtime.   Timolol Maleate 0.5 % (DAILY) Soln Place 1 drop into both eyes 2 (two) times daily.   Travoprost (BAK Free) 0.004 % Soln ophthalmic solution Commonly known as: TRAVATAN Place 1 drop into both eyes at bedtime.        All past medical history, surgical history, allergies, family history, immunizations andmedications were updated in the EMR today and reviewed under the history and medication portions of their EMR.     ROS: Negative, with the exception of above mentioned in HPI   Objective:  BP (!) 103/55 (BP Location: Right Arm, Patient Position: Sitting, Cuff Size: Large)    Pulse (!) 58    Temp 98 F (36.7 C) (Temporal)    Resp  16    Ht 5\' 3"  (1.6 m)    Wt 196 lb (88.9 kg)    SpO2 96%    BMI 34.72 kg/m  Body mass index is 34.72 kg/m. Gen: Afebrile. No acute distress. Nontoxic in appearance, well developed, well nourished.  HENT: AT. [place].  Eyes:Pupils Equal Round Reactive to light, Extraocular movements intact,  Conjunctiva without redness, discharge or icterus. Skin: no rashes, purpura or petechiae.  Neuro: Normal gait. PERLA. EOMi. Alert. Oriented x3  Psych: Normal affect, dress and demeanor. Normal speech. Normal thought content and judgment.  No exam data present No results found. No results found for this or any previous visit (from the past 24 hour(s)).  Assessment/Plan: Carol [name] is a 64 y.o. female present for OV for  Encounter for screening mammogram for malignant neoplasm of breast - MM 3D SCREEN BREAST BILATERAL; Future Estrogen deficiency - DG Bone Density; Future  Hot flashes due to menopause Tolerating Celexa.  Needs more coverage. Increase Celexa to 20 mg daily. Follow-up already scheduled for her chronic routine conditions.   Reviewed expectations re: course of current medical issues.  Discussed self-management of symptoms.  Outlined signs and symptoms indicating need for more acute intervention.  Patient verbalized understanding and all questions were answered.  Patient received an After-Visit Summary.    Orders Placed This Encounter  Procedures   MM 3D SCREEN BREAST BILATERAL   DG Bone Density   Meds ordered this encounter  Medications    citalopram (CELEXA) 20 MG tablet    Sig: Take 1 tablet (20 mg total) by mouth daily.    Dispense:  90 tablet    Refill:  1   Referral Orders  No referral(s) requested today     Note is dictated utilizing voice recognition software. Although note has been proof read prior to signing, occasional typographical errors still can be missed. If any questions arise, please do not hesitate to call for verification.   electronically signed by:  77, DO  [facility] - [place]

## 2019-11-29 ENCOUNTER — Other Ambulatory Visit: Payer: Self-pay | Admitting: Family Medicine

## 2019-12-23 ENCOUNTER — Other Ambulatory Visit: Payer: Self-pay | Admitting: Family Medicine

## 2020-01-23 LAB — HM DIABETES EYE EXAM

## 2020-02-03 ENCOUNTER — Other Ambulatory Visit: Payer: Self-pay

## 2020-02-03 ENCOUNTER — Ambulatory Visit (INDEPENDENT_AMBULATORY_CARE_PROVIDER_SITE_OTHER): Payer: 59

## 2020-02-03 DIAGNOSIS — Z23 Encounter for immunization: Secondary | ICD-10-CM

## 2020-02-06 ENCOUNTER — Ambulatory Visit: Payer: 59

## 2020-02-19 ENCOUNTER — Other Ambulatory Visit: Payer: Self-pay | Admitting: Family Medicine

## 2020-03-10 ENCOUNTER — Telehealth: Payer: Self-pay | Admitting: Family Medicine

## 2020-03-10 NOTE — Telephone Encounter (Signed)
Informed pt to fast for sugars if can. But will check A1c which doesn't require fasting labs. Pt states she will come fasting.  FYI

## 2020-03-10 NOTE — Telephone Encounter (Signed)
Patient has appt 03/11/20 at 0930 and wants to know if she should be fasting for that appt. Please call patient to advise.

## 2020-03-11 ENCOUNTER — Other Ambulatory Visit: Payer: Self-pay

## 2020-03-11 ENCOUNTER — Encounter: Payer: Self-pay | Admitting: Family Medicine

## 2020-03-11 ENCOUNTER — Ambulatory Visit (INDEPENDENT_AMBULATORY_CARE_PROVIDER_SITE_OTHER): Payer: Medicare Other | Admitting: Family Medicine

## 2020-03-11 VITALS — BP 97/60 | HR 50 | Temp 98.5°F | Resp 16 | Ht 63.0 in | Wt 192.0 lb

## 2020-03-11 DIAGNOSIS — I10 Essential (primary) hypertension: Secondary | ICD-10-CM

## 2020-03-11 DIAGNOSIS — K219 Gastro-esophageal reflux disease without esophagitis: Secondary | ICD-10-CM

## 2020-03-11 DIAGNOSIS — N951 Menopausal and female climacteric states: Secondary | ICD-10-CM | POA: Diagnosis not present

## 2020-03-11 DIAGNOSIS — E119 Type 2 diabetes mellitus without complications: Secondary | ICD-10-CM

## 2020-03-11 DIAGNOSIS — Z23 Encounter for immunization: Secondary | ICD-10-CM

## 2020-03-11 DIAGNOSIS — E785 Hyperlipidemia, unspecified: Secondary | ICD-10-CM

## 2020-03-11 DIAGNOSIS — Z1231 Encounter for screening mammogram for malignant neoplasm of breast: Secondary | ICD-10-CM

## 2020-03-11 DIAGNOSIS — E2839 Other primary ovarian failure: Secondary | ICD-10-CM

## 2020-03-11 LAB — POCT GLYCOSYLATED HEMOGLOBIN (HGB A1C)
HbA1c POC (<> result, manual entry): 5.8 % (ref 4.0–5.6)
HbA1c, POC (controlled diabetic range): 5.8 % (ref 0.0–7.0)
HbA1c, POC (prediabetic range): 5.8 % (ref 5.7–6.4)
Hemoglobin A1C: 5.8 % — AB (ref 4.0–5.6)

## 2020-03-11 MED ORDER — LINAGLIPTIN 5 MG PO TABS
5.0000 mg | ORAL_TABLET | Freq: Every day | ORAL | 1 refills | Status: DC
Start: 2020-03-11 — End: 2020-06-29

## 2020-03-11 MED ORDER — METFORMIN HCL ER 750 MG PO TB24
750.0000 mg | ORAL_TABLET | Freq: Every day | ORAL | 1 refills | Status: DC
Start: 2020-03-11 — End: 2020-06-29

## 2020-03-11 MED ORDER — OMEPRAZOLE 20 MG PO CPDR
20.0000 mg | DELAYED_RELEASE_CAPSULE | Freq: Every day | ORAL | 1 refills | Status: DC
Start: 2020-03-11 — End: 2020-06-29

## 2020-03-11 MED ORDER — METOPROLOL SUCCINATE ER 25 MG PO TB24
25.0000 mg | ORAL_TABLET | Freq: Every day | ORAL | 1 refills | Status: DC
Start: 2020-03-11 — End: 2020-06-29

## 2020-03-11 MED ORDER — ROSUVASTATIN CALCIUM 40 MG PO TABS
40.0000 mg | ORAL_TABLET | Freq: Every day | ORAL | 3 refills | Status: DC
Start: 2020-03-11 — End: 2020-11-18

## 2020-03-11 NOTE — Patient Instructions (Signed)
Great to see you today.  A1c looks great at 5.8.

## 2020-03-11 NOTE — Progress Notes (Signed)
This visit occurred during the SARS-CoV-2 public health emergency.  Safety protocols were in place, including screening questions prior to the visit, additional usage of staff PPE, and extensive cleaning of exam room while observing appropriate contact time as indicated for disinfecting solutions.    Patient ID: Carol [name], female  DOB: 01-05-1956, 64 y.o.   MRN: [id number] Patient Care Team    Relationship Specialty Notifications Start End  Natalia Leatherwood, DO PCP - General Family Medicine  06/10/19   Jake Bathe, MD Consulting Physician Cardiology  06/12/19   Iva Boop, MD Consulting Physician Gastroenterology  06/12/19   Elwin Mocha, MD Consulting Physician Ophthalmology  06/12/19   Frankey Poot, MD Referring Physician Obstetrics and Gynecology  06/12/19   Jessica Priest, MD Consulting Physician Allergy and Immunology  06/12/19     Chief Complaint  Patient presents with  . [facility]    Subjective:  Carol [name] is a 64 y.o.  Female  present for Type 2 diabetes mellitus without complication, without long-term current use of insulin (HCC)/obesity Pt reports compliance with metformin 750 mg daily and tradjenta 5 mg QD. Patient denies dizziness, hyperglycemic or hypoglycemic events. Patient denies numbness, tingling in the extremities or nonhealing wounds of feet.  PNA series: prevnar started today Flu shot: Up-to-date 2021 (recommneded yearly) Foot exam:  Completed 10/2019 Eye exam: Up-to-date 02/05/2020, Dr. Sherryll Burger will request records A1c: 6.9> 6.1>7.2>5.8 collected today  Essential hypertension/HTN Pt reports compliance  with Toprol-XL 25, candesartan-HCTZ 16-12.5 mg daily, Crestor 40 mg daily and baby aspirin. Blood pressures ranges at home within normal limits. Patient denies chest pain, shortness of breath, dizziness or lower extremity edema.    Pt takes a daily baby ASA. Pt is  prescribed statin. RF: Hypertension, hyperlipidemia, diabetic, obesity, family history.    Gastroesophageal reflux disease without esophagitis Patient reports symptoms are well controlled on  her omeprazole 20 mg daily.  She has been unable to discontinue medication in the past without recurrence of symptoms immediately.  Hot flashes: Patient reports celexa 20 mg qd has helped a great deal with her hot flashes.   Depression screen [facility] 2/9 11/05/2019 06/10/2019  Decreased Interest 0 0  Down, Depressed, Hopeless 0 0  PHQ - 2 Score 0 0   No flowsheet data found.   Immunization History  Administered Date(s) Administered  . Fluad Quad(high Dose 65+) 02/21/2020  . Influenza,inj,Quad PF,6+ Mos 03/30/2019  . PFIZER SARS-COV-2 Vaccination 07/02/2019, 07/30/2019, 01/19/2020  . Pneumococcal Conjugate-13 03/11/2020  . Tdap 08/27/2012  . Zoster Recombinat (Shingrix) 11/05/2019, 02/03/2020   Past Medical History:  Diagnosis Date  . Angio-edema   . Chest discomfort    , Atypical  . Chicken pox   . COVID-19 05/09/2019  . Diabetic acidosis, type II (HCC)   . Diverticulitis - large vs small bowel vs both?   . Gastroesophageal reflux disease   . Glaucoma   . Hyperlipidemia   . Hypertension   . PVC (premature ventricular contraction)   . Systolic dysfunction    , Hyperdynamic   Allergies  Allergen Reactions  . Ivp Dye [Iodinated Diagnostic Agents] Hives and Shortness Of Breath    Did ok in OP setting with 13 hr Premedications   . Latanoprost   . Lisinopril Cough  . Onion Rash    Rash and GI upset  . Sulfa Antibiotics Hives   Past Surgical History:  Procedure Laterality Date  . APPENDECTOMY N/A 11/05/2013   Procedure: APPENDECTOMY;  Surgeon: Cherylynn Ridges, MD;  [facility];  Service: General;  Laterality: N/A;  . 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 CARDIAC CATHETERIZATION  2010   Patient reports cardiac cath approximately 2010.  "Normal "performed by Dr. Ty Hilts  . COLONOSCOPY  04/03/2019  . LAPAROTOMY N/A 11/05/2013   Procedure:  EXPLORATORY LAPAROTOMY;  Surgeon: Cherylynn Ridges, MD;  Location[facility] OR;  Service: General;  Laterality: N/A;  Carol [name]   Patient reports normal Lexiscan completed around 2010 by Dr. Ty Hilts.  Marland Kitchen TOTAL ABDOMINAL HYSTERECTOMY  2004   Family History  Problem Relation Age of Onset  . Heart disease Mother   . Heart attack Mother   . Asthma Mother   . Early death Mother   . Miscarriages / [place] Mother   . Heart disease Father   . Lung cancer Father   . Heart attack Brother   . Asthma Brother   . Heart disease Brother   . Kidney disease Brother   . Diverticulitis Sister   . Heart disease Brother   . Heart attack Brother   . Heart disease Brother   . Heart attack Brother   . Early death Maternal Grandmother   . Tuberculosis Maternal Grandmother   . Depression Maternal Grandfather   . Heart disease Paternal Grandmother   . Heart disease Sister   . Colon cancer Neg Hx   . Esophageal cancer Neg Hx   . Rectal cancer Neg Hx   . Stomach cancer Neg Hx    Social History   Social History Narrative   Marital status/children/pets: Married.   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   Safety:      -smoke alarm in the home:Yes     - wears seatbelt: Yes     - Feels safe in their relationships: Yes    Allergies as of 03/11/2020      Reactions   Ivp Dye [iodinated Diagnostic Agents] Hives, Shortness Of Breath   Did ok in OP setting with 13 hr Premedications    Latanoprost    Lisinopril Cough   Onion Rash   Rash and GI upset   Sulfa Antibiotics Hives      Medication List       Accurate as of March 11, 2020 10:21 AM. If you have any questions, ask your nurse or doctor.        aspirin 81 MG tablet Take 81 mg by mouth at bedtime.   candesartan-hydrochlorothiazide 16-12.5 MG tablet Commonly known as: ATACAND HCT Take 1 tablet by mouth daily.   cetirizine 10 MG tablet Commonly known as: ZYRTEC Take 10 mg by mouth at bedtime.    citalopram 20 MG tablet Commonly known as: CeleXA Take 1 tablet (20 mg total) by mouth daily.   clobetasol cream 0.05 % Commonly known as: TEMOVATE Apply 1 application topically 2 (two) times daily. PRN   linagliptin 5 MG Tabs tablet Commonly known as: TRADJENTA Take 1 tablet (5 mg total) by mouth daily.   metFORMIN 750 MG 24 hr tablet Commonly known as: GLUCOPHAGE-XR Take 1 tablet (750 mg total) by mouth daily with breakfast.   metoprolol succinate 25 MG 24 hr tablet Commonly known as: TOPROL-XL Take 1 tablet (25 mg total) by mouth daily. Take with or immediately following a meal. What changed:   medication strength  how much to take Changed by: Felix Pacini, DO   omeprazole 20 MG capsule  Commonly known as: PRILOSEC Take 1 capsule (20 mg total) by mouth daily.   OneTouch Delica Plus Lancet33G Misc check blood sugar TWICE DAILY   OneTouch Ultra test strip Generic drug: glucose blood test sugars TWICE DAILY as directed   rosuvastatin 40 MG tablet Commonly known as: CRESTOR Take 1 tablet (40 mg total) by mouth at bedtime.   Timolol Maleate 0.5 % (DAILY) Soln Place 1 drop into both eyes 2 (two) times daily. What changed: Another medication with the same name was removed. Continue taking this medication, and follow the directions you see here. Changed by: Felix Pacinienee Rhegan Trunnell, DO   Travoprost (BAK Free) 0.004 % Soln ophthalmic solution Commonly known as: TRAVATAN Place 1 drop into both eyes at bedtime.       All past medical history, surgical history, allergies, family history, immunizations andmedications were updated in the EMR today and reviewed under the history and medication portions of their EMR.     Recent Results (from the past 2160 hour(s))  POCT HgB A1C     Status: Abnormal   Collection Time: 03/11/20  9:46 AM  Result Value Ref Range   Hemoglobin A1C 5.8 (A) 4.0 - 5.6 %   HbA1c POC (<> result, manual entry) 5.8 4.0 - 5.6 %   HbA1c, POC (prediabetic range)  5.8 5.7 - 6.4 %   HbA1c, POC (controlled diabetic range) 5.8 0.0 - 7.0 %     ROS: 14 pt review of systems performed and negative (unless mentioned in an HPI)  Objective: BP 97/60 (BP Location: Right Arm, Patient Position: Sitting, Cuff Size: Normal)   Pulse (!) 50   Temp 98.5 F (36.9 C) (Oral)   Resp 16   Ht 5\' 3"  (1.6 m)   Wt 192 lb (87.1 kg)   SpO2 99%   BMI 34.01 kg/m  Gen: Afebrile. No acute distress. Pleasant female.  HENT: AT. [place]. Eyes:Pupils Equal Round Reactive to light, Extraocular movements intact,  Conjunctiva without redness, discharge or icterus. Neck/lymp/endocrine: Supple,no lymphadenopathy, no thyromegaly CV: RRR 1/6 SM, no edema, +2/4 P posterior tibialis pulses Chest: CTAB, no wheeze or crackles Skin: no rashes, purpura or petechiae.  Neuro:  Normal gait. PERLA. EOMi. Alert. Oriented x3 Psych: Normal affect, dress and demeanor. Normal speech. Normal thought content and judgment.  Results for orders placed or performed in visit on 03/11/20 (from the past 24 hour(s))  POCT HgB A1C     Status: Abnormal   Collection Time: 03/11/20  9:46 AM  Result Value Ref Range   Hemoglobin A1C 5.8 (A) 4.0 - 5.6 %   HbA1c POC (<> result, manual entry) 5.8 4.0 - 5.6 %   HbA1c, POC (prediabetic range) 5.8 5.7 - 6.4 %   HbA1c, POC (controlled diabetic range) 5.8 0.0 - 7.0 %    Assessment/plan: Rana SnareJill D Phang is a 64 y.o. female present for CPE  Type 2 diabetes mellitus without complication, without long-term current use of insulin (HCC)/obesity Much improved today Continue Metformin 750 mg daily> may be able to decrease next visit Continue tradjenta 5 mg  Continue monitoring blood glucose daily PNA series: prevnar started today Flu shot: Up-to-date 2021 (recommneded yearly) Foot exam:  Completed 10/2019 Eye exam: Up-to-date 02/05/2020, Dr. Sherryll BurgerShah> will request records A1c: 6.9> 6.1>7.2>5.8 collected today -Follow-up every 4 months  Essential  hypertension/HLD/obesity stable continue metoprolol XL at a decreased dose of half tab daily (25 mg dose) Continue candesartan/HCTZ 16-12.5 mg Continue  Crestor 40 mg daily Continue baby aspirin daily Follow-up  every 4 months with chronic conditions  Gastroesophageal reflux disease without esophagitis -stable -continue  omeprazole 20 mg daily  Hot flashes: Much improved Continue Celexa 20 mg daily.   Return in about 4 months (around 06/29/2020) for [facility] (30 min). Orders Placed This Encounter  Procedures  . MM 3D SCREEN BREAST BILATERAL  . DG Bone Density  . Pneumococcal conjugate vaccine 13-valent IM  . POCT HgB A1C   Meds ordered this encounter  Medications  . linagliptin (TRADJENTA) 5 MG TABS tablet    Sig: Take 1 tablet (5 mg total) by mouth daily.    Dispense:  90 tablet    Refill:  1    May be dispensed in 30-day or 90-day scripts pending upon patient preference and insurance.  Total supply 6 months.  . metFORMIN (GLUCOPHAGE-XR) 750 MG 24 hr tablet    Sig: Take 1 tablet (750 mg total) by mouth daily with breakfast.    Dispense:  90 tablet    Refill:  1  . metoprolol succinate (TOPROL-XL) 25 MG 24 hr tablet    Sig: Take 1 tablet (25 mg total) by mouth daily. Take with or immediately following a meal.    Dispense:  90 tablet    Refill:  1    This prescription was filled on 02/19/2020. Any refills authorized will be placed on file.  Marland Kitchen omeprazole (PRILOSEC) 20 MG capsule    Sig: Take 1 capsule (20 mg total) by mouth daily.    Dispense:  90 capsule    Refill:  1  . rosuvastatin (CRESTOR) 40 MG tablet    Sig: Take 1 tablet (40 mg total) by mouth at bedtime.    Dispense:  90 tablet    Refill:  3   Referral Orders  No referral(s) requested today    Electronically signed by: Felix Pacini, DO [facility]

## 2020-03-11 NOTE — Progress Notes (Signed)
c 

## 2020-03-18 ENCOUNTER — Other Ambulatory Visit: Payer: Self-pay

## 2020-03-18 ENCOUNTER — Ambulatory Visit (INDEPENDENT_AMBULATORY_CARE_PROVIDER_SITE_OTHER): Payer: Medicare Other

## 2020-03-18 DIAGNOSIS — E2839 Other primary ovarian failure: Secondary | ICD-10-CM | POA: Diagnosis not present

## 2020-03-18 DIAGNOSIS — E119 Type 2 diabetes mellitus without complications: Secondary | ICD-10-CM

## 2020-03-18 DIAGNOSIS — Z13820 Encounter for screening for osteoporosis: Secondary | ICD-10-CM

## 2020-04-28 ENCOUNTER — Other Ambulatory Visit: Payer: Self-pay | Admitting: Family Medicine

## 2020-04-29 DIAGNOSIS — M722 Plantar fascial fibromatosis: Secondary | ICD-10-CM | POA: Diagnosis not present

## 2020-04-29 DIAGNOSIS — E119 Type 2 diabetes mellitus without complications: Secondary | ICD-10-CM | POA: Diagnosis not present

## 2020-04-29 DIAGNOSIS — M21621 Bunionette of right foot: Secondary | ICD-10-CM | POA: Diagnosis not present

## 2020-05-14 ENCOUNTER — Telehealth (INDEPENDENT_AMBULATORY_CARE_PROVIDER_SITE_OTHER): Payer: Medicare Other | Admitting: Family Medicine

## 2020-05-14 ENCOUNTER — Ambulatory Visit: Payer: Medicare Other

## 2020-05-14 ENCOUNTER — Telehealth: Payer: Self-pay

## 2020-05-14 DIAGNOSIS — R319 Hematuria, unspecified: Secondary | ICD-10-CM | POA: Diagnosis not present

## 2020-05-14 DIAGNOSIS — U071 COVID-19: Secondary | ICD-10-CM | POA: Diagnosis not present

## 2020-05-14 DIAGNOSIS — N390 Urinary tract infection, site not specified: Secondary | ICD-10-CM | POA: Diagnosis not present

## 2020-05-14 NOTE — Telephone Encounter (Signed)
Patient had virtual apppointment with Dr.Kim.   [facility] Day - Client TELEPHONE ADVICE RECORD [employer] Patient Name: Carol [name] Gender: Female DOB: July 10, 1955 Age: 65 Y 2 M 19 D Return Phone Number: [phone] (Primary), [phone] (Secondary) Address: [name] [address] Client [facility] Day - Client Client Site [facility] - Day Physician [place], [place] Contact Type Call Who Is Calling Patient / Member / Family / Caregiver Call Type Triage / Clinical Relationship To Patient Self Return Phone Number [phone] (Primary) Chief Complaint Flank Pain Reason for Call Symptomatic / Request for Health Information Initial Comment Carol [name], stats she has blood in urine and frequency that she discounted yesterday, uti/ kidney/bladder. Having pain in lower back, is currently + for covid via home test yesterday morning Translation No Nurse Assessment Nurse: Carol Deutscher, RN, Carol [name] Date/Time (Eastern Time): 05/14/2020 11:10:40 AM Confirm and document reason for call. If symptomatic, describe symptoms. ---Caller states she has blood in her urine and frequency. This started yesterday and every time she goes she sees blood. No fever but took a home test and is positive for covid. Did a home test yesterday. Has a sore throat and a headache Does the patient have any new or worsening symptoms? ---Yes Will a triage be completed? ---Yes Related visit to physician within the last 2 weeks? ---No Does the PT have any chronic conditions? (i.e. diabetes, asthma, this includes High risk factors for pregnancy, etc.) ---Yes List chronic conditions. ---diabetes Is this a behavioral health or substance abuse call? ---No Guidelines Guideline Title Affirmed Question Affirmed Notes Nurse Date/Time Carol [name] Time) Urinary Symptoms Side (flank) or lower back pain present Carol [name], Carol [name] 05/14/2020 11:13:10 AM Disp.  Time Carol [name] Time) Disposition Final User 05/14/2020 11:17:24 AM See PCP within 24 Hours Yes Carol Deutscher, RN, Carol [name] PLEASE NOTE: All timestamps contained within this report are represented as [place] Standard Time. CONFIDENTIALTY NOTICE: This fax transmission is intended only for the addressee. It contains information that is legally privileged, confidential or otherwise protected from use or disclosure. If you are not the intended recipient, you are strictly prohibited from reviewing, disclosing, copying using or disseminating any of this information or taking any action in reliance on or regarding this information. If you have received this fax in error, please notify us immediately by telephone so that we can arrange for its return to [place]. Phone: [phone], Toll-Free: [phone], Fax: [phone] Page: 2 of 2 Call Id: [id number] Caller Disagree/Comply Comply Caller Understands Yes PreDisposition Call Doctor Care Advice Given Per Guideline SEE PCP WITHIN 24 HOURS: * IF OFFICE WILL BE OPEN: You need to be examined within the next 24 hours. Call your doctor (or NP/PA) when the office opens and make an appointment. PAIN MEDICINES: * For pain relief, you can take either acetaminophen, ibuprofen, or naproxen. CALL BACK IF: * Fever occurs * Unable to urinate and bladder feels full * You become worse CARE ADVICE given per Urinary Symptoms (Adult) guideline. Referrals REFERRED TO PCP OFFICE

## 2020-05-14 NOTE — Patient Instructions (Signed)
Seek in person care promptly today as we discussed.

## 2020-05-14 NOTE — Progress Notes (Signed)
Virtual Visit via Video Note  I connected with Carol [name]  on 05/14/20 at  3:00 PM EST by a video enabled telemedicine application and verified that I am speaking with the correct person using two identifiers.  Location patient: home, [place] Location provider:work or home office Persons participating in the virtual visit: patient, provider  I discussed the limitations of evaluation and management by telemedicine and the availability of in person appointments. The patient expressed understanding and agreed to proceed.   HPI:  Acute telemedicine visit for Hematuria: -Onset:yesterday -Symptoms include: gross hematuria each times she urinates, had some urgency yesterday -Denies: flank pain, fevers, burning with urination, abd pain, NVD, CP, SOB, inability to eat/drink/get out bed -was diagnose with COVID19 yesterday after an exposure - had had mild sore throat, some sinus congestion, feeling tired and a headache since yesterday  -Has tried: nothing -Pertinent past medical history:see below -Pertinent medication allergies:ivp, latanoprost, lisinopril, onion, sulfa -COVID-19 vaccine status: fully vaccinated  ROS: See pertinent positives and negatives per HPI.  Past Medical History:  Diagnosis Date  . Angio-edema   . Chest discomfort    , Atypical  . Chicken pox   . COVID-19 05/09/2019  . Diabetic acidosis, type II (HCC)   . Diverticulitis - large vs small bowel vs both?   . Gastroesophageal reflux disease   . Glaucoma   . Hyperlipidemia   . Hypertension   . PVC (premature ventricular contraction)   . Systolic dysfunction    , Hyperdynamic    Past Surgical History:  Procedure Laterality Date  . APPENDECTOMY N/A 11/05/2013   Procedure: APPENDECTOMY;  Surgeon: Cherylynn Ridges, MD;  Location: [facility] OR;  Service: General;  Laterality: N/A;  . 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 CARDIAC CATHETERIZATION  2010   Patient reports cardiac cath approximately 2010.   "Normal "performed by Dr. Ty Hilts  . COLONOSCOPY  04/03/2019  . LAPAROTOMY N/A 11/05/2013   Procedure: EXPLORATORY LAPAROTOMY;  Surgeon: Cherylynn Ridges, MD;  Location[facility] OR;  Service: General;  Laterality: N/A;  [name]   Patient reports normal Lexiscan completed around 2010 by Dr. Ty Hilts.  Marland Kitchen TOTAL ABDOMINAL HYSTERECTOMY  2004     Current Outpatient Medications:  .  aspirin 81 MG tablet, Take 81 mg by mouth at bedtime. , Disp: , Rfl:  .  candesartan-hydrochlorothiazide (ATACAND HCT) 16-12.5 MG tablet, Take 1 tablet by mouth daily., Disp: 90 tablet, Rfl: 1 .  cetirizine (ZYRTEC) 10 MG tablet, Take 10 mg by mouth at bedtime. , Disp: , Rfl:  .  citalopram (CELEXA) 20 MG tablet, Take 1 tablet (20 mg total) by mouth daily., Disp: 30 tablet, Rfl: 0 .  clobetasol cream (TEMOVATE) 0.05 %, Apply 1 application topically 2 (two) times daily. PRN (Patient not taking: Reported on 11/27/2019), Disp: , Rfl:  .  Lancets (ONETOUCH DELICA PLUS LANCET33G) MISC, check blood sugar TWICE DAILY, Disp: , Rfl:  .  linagliptin (TRADJENTA) 5 MG TABS tablet, Take 1 tablet (5 mg total) by mouth daily., Disp: 90 tablet, Rfl: 1 .  metFORMIN (GLUCOPHAGE-XR) 750 MG 24 hr tablet, Take 1 tablet (750 mg total) by mouth daily with breakfast., Disp: 90 tablet, Rfl: 1 .  metoprolol succinate (TOPROL-XL) 25 MG 24 hr tablet, Take 1 tablet (25 mg total) by mouth daily. Take with or immediately following a meal., Disp: 90 tablet, Rfl: 1 .  omeprazole (PRILOSEC) 20 MG capsule, Take 1 capsule (20 mg total) by  mouth daily., Disp: 90 capsule, Rfl: 1 .  ONETOUCH ULTRA test strip, test sugars TWICE DAILY as directed, Disp: 50 strip, Rfl: 6 .  rosuvastatin (CRESTOR) 40 MG tablet, Take 1 tablet (40 mg total) by mouth at bedtime., Disp: 90 tablet, Rfl: 3 .  Timolol Maleate 0.5 % (DAILY) SOLN, Place 1 drop into both eyes 2 (two) times daily., Disp: , Rfl:  .  Travoprost, BAK Free, (TRAVATAN) 0.004 % SOLN ophthalmic solution, Place 1  drop into both eyes at bedtime., Disp: , Rfl:   EXAM:  VITALS per patient if applicable:  GENERAL: alert, oriented, appears well and in no acute distress  HEENT: atraumatic, conjunttiva clear, no obvious abnormalities on inspection of external nose and ears  NECK: normal movements of the head and neck  LUNGS: on inspection no signs of respiratory distress, breathing rate appears normal, no obvious gross SOB, gasping or wheezing  CV: no obvious cyanosis  MS: moves all visible extremities without noticeable abnormality  PSYCH/NEURO: pleasant and cooperative, no obvious depression or anxiety, speech and thought processing grossly intact  ASSESSMENT AND PLAN:  Discussed the following assessment and plan:  Hematuria, unspecified type  COVID-19  -we discussed possible serious and likely etiologies, options for evaluation and workup, limitations of telemedicine visit vs in person visit, treatment, treatment risks and precautions.  Given her hematuria in the setting of COVID-19 did advise in person evaluation at a higher level of care as will need urine studies and potentially labs or imaging to further evaluate.  Reports due to Covid protocols she was not able to get in with her PCP office.  Discussed options for care and she prefers to start with going to an urgent care that is local to her.  She is opted to go today.  She feels that she can go by private vehicle.  Did advise once she has had an evaluation, that she also discussed COVID treatment with the urgent care provider or make a follow-up video visit once etiology of hematuria is determined managed.  Advised to seek prompt in person care if worsening, new symptoms arise, or if is not improving with treatment. Discussed options for inperson care if PCP office not available. Did let this patient know that I only do telemedicine on Tuesdays and Thursdays for [place]. Advised to schedule follow up visit with PCP or UCC if any further  questions or concerns to avoid delays in care.   I discussed the assessment and treatment plan with the patient. The patient was provided an opportunity to ask questions and all were answered. The patient agreed with the plan and demonstrated an understanding of the instructions.     Terressa Koyanagi, DO

## 2020-05-19 ENCOUNTER — Other Ambulatory Visit: Payer: Self-pay | Admitting: Family Medicine

## 2020-05-25 ENCOUNTER — Ambulatory Visit (INDEPENDENT_AMBULATORY_CARE_PROVIDER_SITE_OTHER): Payer: Medicare Other | Admitting: Family Medicine

## 2020-05-25 ENCOUNTER — Encounter: Payer: Self-pay | Admitting: Family Medicine

## 2020-05-25 ENCOUNTER — Other Ambulatory Visit: Payer: Self-pay

## 2020-05-25 VITALS — BP 114/67 | HR 63 | Temp 98.2°F | Ht 63.0 in | Wt 190.0 lb

## 2020-05-25 DIAGNOSIS — R319 Hematuria, unspecified: Secondary | ICD-10-CM

## 2020-05-25 DIAGNOSIS — U071 COVID-19: Secondary | ICD-10-CM | POA: Diagnosis not present

## 2020-05-25 LAB — POCT URINALYSIS DIPSTICK
Bilirubin, UA: NEGATIVE
Blood, UA: NEGATIVE
Glucose, UA: NEGATIVE
Ketones, UA: NEGATIVE
Leukocytes, UA: NEGATIVE
Nitrite, UA: NEGATIVE
Protein, UA: NEGATIVE
Spec Grav, UA: >=1.03 — AB (ref 1.010–1.025)
Urobilinogen, UA: 0.2 E.U./dL
pH, UA: 6 (ref 5.0–8.0)

## 2020-05-25 NOTE — Patient Instructions (Signed)
Your urine is normal today.

## 2020-05-25 NOTE — Progress Notes (Signed)
This visit occurred during the SARS-CoV-2 public health emergency.  Safety protocols were in place, including screening questions prior to the visit, additional usage of staff PPE, and extensive cleaning of exam room while observing appropriate contact time as indicated for disinfecting solutions.    Carol [name] , 01-Jan-1956, 64 y.o., female MRN: [id number] Patient Care Team    Relationship Specialty Notifications Start End  Natalia Leatherwood, DO PCP - General Family Medicine  06/10/19   Jake Bathe, MD Consulting Physician Cardiology  06/12/19   Iva Boop, MD Consulting Physician Gastroenterology  06/12/19   Elwin Mocha, MD Consulting Physician Ophthalmology  06/12/19   Frankey Poot, MD Referring Physician Obstetrics and Gynecology  06/12/19   Jessica Priest, MD Consulting Physician Allergy and Immunology  06/12/19     Chief Complaint  Patient presents with  . Hematuria     Subjective: Pt presents for an OV to follow up on covid 19 and hematuria/UTI. She was diagnosed with covid 19, 05/13/2020. Pt reports she and her husband had mild symptoms of covid 19 after returning from [place]. She also was seen for hematuria and abd pressure. Diagnosed with UTI and treated with keflex and then called and switched to cipro. She reports since treatment her symptoms have resolved.   Depression screen [facility] 2/9 05/25/2020 11/05/2019 06/10/2019  Decreased Interest 0 0 0  Down, Depressed, Hopeless 0 0 0  PHQ - 2 Score 0 0 0    Allergies  Allergen Reactions  . Ivp Dye [Iodinated Diagnostic Agents] Hives and Shortness Of Breath    Did ok in OP setting with 13 hr Premedications   . Latanoprost   . Lisinopril Cough  . Onion Rash    Rash and GI upset  . Sulfa Antibiotics Hives   Social History   Social History Narrative   Marital status/children/pets: Married.   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   Safety:      -smoke alarm in the home:Yes      - wears seatbelt: Yes     - Feels safe in their relationships: Yes   Past Medical History:  Diagnosis Date  . Angio-edema   . Chest discomfort    , Atypical  . Chicken pox   . COVID-19 05/09/2019  . Diabetic acidosis, type II (HCC)   . Diverticulitis - large vs small bowel vs both?   . Gastroesophageal reflux disease   . Glaucoma   . Hyperlipidemia   . Hypertension   . PVC (premature ventricular contraction)   . Systolic dysfunction    , Hyperdynamic   Past Surgical History:  Procedure Laterality Date  . APPENDECTOMY N/A 11/05/2013   Procedure: APPENDECTOMY;  Surgeon: Cherylynn Ridges, MD;  Location: [facility] OR;  Service: General;  Laterality: N/A;  . 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 CARDIAC CATHETERIZATION  2010   Patient reports cardiac cath approximately 2010.  "Normal "performed by Dr. Ty Hilts  . COLONOSCOPY  04/03/2019  . LAPAROTOMY N/A 11/05/2013   Procedure: EXPLORATORY LAPAROTOMY;  Surgeon: Cherylynn Ridges, MD;  Location: [facility] OR;  Service: General;  Laterality: N/A;  [name]   Patient reports normal Lexiscan completed around 2010 by Dr. Ty Hilts.  Marland Kitchen TOTAL ABDOMINAL HYSTERECTOMY  2004   Family History  Problem Relation Age of Onset  . Heart disease Mother   . Heart attack Mother   . Asthma Mother   .  Early death Mother   . Miscarriages / [place] Mother   . Heart disease Father   . Lung cancer Father   . Heart attack Brother   . Asthma Brother   . Heart disease Brother   . Kidney disease Brother   . Diverticulitis Sister   . Heart disease Brother   . Heart attack Brother   . Heart disease Brother   . Heart attack Brother   . Early death Maternal Grandmother   . Tuberculosis Maternal Grandmother   . Depression Maternal Grandfather   . Heart disease Paternal Grandmother   . Heart disease Sister   . Colon cancer Neg Hx   . Esophageal cancer Neg Hx   . Rectal cancer Neg Hx   . Stomach cancer Neg Hx    Allergies as of  05/25/2020      Reactions   Ivp Dye [iodinated Diagnostic Agents] Hives, Shortness Of Breath   Did ok in OP setting with 13 hr Premedications    Latanoprost    Lisinopril Cough   Onion Rash   Rash and GI upset   Sulfa Antibiotics Hives      Medication List       Accurate as of May 25, 2020  2:36 PM. If you have any questions, ask your nurse or doctor.        aspirin 81 MG tablet Take 81 mg by mouth at bedtime.   candesartan-hydrochlorothiazide 16-12.5 MG tablet Commonly known as: ATACAND HCT Take 1 tablet by mouth daily.   cetirizine 10 MG tablet Commonly known as: ZYRTEC Take 10 mg by mouth at bedtime.   citalopram 20 MG tablet Commonly known as: CELEXA Take 1 tablet (20 mg total) by mouth daily.   clobetasol cream 0.05 % Commonly known as: TEMOVATE Apply 1 application topically 2 (two) times daily. PRN   linagliptin 5 MG Tabs tablet Commonly known as: TRADJENTA Take 1 tablet (5 mg total) by mouth daily.   metFORMIN 750 MG 24 hr tablet Commonly known as: GLUCOPHAGE-XR Take 1 tablet (750 mg total) by mouth daily with breakfast.   metoprolol succinate 25 MG 24 hr tablet Commonly known as: TOPROL-XL Take 1 tablet (25 mg total) by mouth daily. Take with or immediately following a meal.   omeprazole 20 MG capsule Commonly known as: PRILOSEC Take 1 capsule (20 mg total) by mouth daily.   OneTouch Delica Plus Lancet33G Misc check blood sugar TWICE DAILY   OneTouch Ultra test strip Generic drug: glucose blood test sugars TWICE DAILY as directed   rosuvastatin 40 MG tablet Commonly known as: CRESTOR Take 1 tablet (40 mg total) by mouth at bedtime.   Timolol Maleate (Once-Daily) 0.5 % Soln Place 1 drop into both eyes 2 (two) times daily.   Travoprost (BAK Free) 0.004 % Soln ophthalmic solution Commonly known as: TRAVATAN Place 1 drop into both eyes at bedtime.       All past medical history, surgical history, allergies, family history,  immunizations andmedications were updated in the EMR today and reviewed under the history and medication portions of their EMR.     ROS: Negative, with the exception of above mentioned in HPI   Objective:  BP 114/67   Pulse 63   Temp 98.2 F (36.8 C) (Oral)   Ht 5\' 3"  (1.6 m)   Wt 190 lb (86.2 kg)   SpO2 98%   BMI 33.66 kg/m  Body mass index is 33.66 kg/m. Gen: Afebrile. No acute distress. Nontoxic in appearance,  well developed, well nourished.  HENT: AT. [place].no cough, or hoarseness.  Eyes:Pupils Equal Round Reactive to light, Extraocular movements intact,  Conjunctiva without redness, discharge or icterus. Chest: CTAB, no wheeze or crackles. Good air movement, normal resp effort.  Abd: Soft. NTND. BS present MSK: no CVA tenderness.  Skin: no rashes, purpura or petechiae.  Neuro: Normal gait. PERLA. EOMi. Alert. Oriented x3  Psych: Normal affect, dress and demeanor. Normal speech. Normal thought content and judgment.  No exam data present No results found. Results for orders placed or performed in visit on 05/25/20 (from the past 24 hour(s))  POCT Urinalysis Dipstick     Status: Abnormal   Collection Time: 05/25/20  2:35 PM  Result Value Ref Range   Color, UA yellow    Clarity, UA clear    Glucose, UA Negative Negative   Bilirubin, UA neg    Ketones, UA neg    Spec Grav, UA >=1.030 (A) 1.010 - 1.025   Blood, UA neg    pH, UA 6.0 5.0 - 8.0   Protein, UA Negative Negative   Urobilinogen, UA 0.2 0.2 or 1.0 E.U./dL   Nitrite, UA neg    Leukocytes, UA Negative Negative   Appearance     Odor      Assessment/Plan: JAMERICA SNAVELY is a 65 y.o. female present for OV for  Hematuria, unspecified type - POCT Urinalysis Dipstick> normal today - no signs of hematuria or infection remain.   COVID-19 Mild symptoms- complete resolution of illness.     Reviewed expectations re: course of current medical issues.  Discussed self-management of symptoms.  Outlined signs and  symptoms indicating need for more acute intervention.  Patient verbalized understanding and all questions were answered.  Patient received an After-Visit Summary.    Orders Placed This Encounter  Procedures  . POCT Urinalysis Dipstick   No orders of the defined types were placed in this encounter.  Referral Orders  No referral(s) requested today     Note is dictated utilizing voice recognition software. Although note has been proof read prior to signing, occasional typographical errors still can be missed. If any questions arise, please do not hesitate to call for verification.   electronically signed by:  Felix Pacini, DO  [facility] - OR

## 2020-06-01 DIAGNOSIS — M722 Plantar fascial fibromatosis: Secondary | ICD-10-CM | POA: Diagnosis not present

## 2020-06-01 DIAGNOSIS — M21622 Bunionette of left foot: Secondary | ICD-10-CM | POA: Diagnosis not present

## 2020-06-01 DIAGNOSIS — M21621 Bunionette of right foot: Secondary | ICD-10-CM | POA: Diagnosis not present

## 2020-06-01 DIAGNOSIS — E119 Type 2 diabetes mellitus without complications: Secondary | ICD-10-CM | POA: Diagnosis not present

## 2020-06-02 ENCOUNTER — Other Ambulatory Visit: Payer: Self-pay

## 2020-06-02 ENCOUNTER — Encounter: Payer: Self-pay | Admitting: Family Medicine

## 2020-06-02 MED ORDER — CANDESARTAN CILEXETIL-HCTZ 16-12.5 MG PO TABS
1.0000 | ORAL_TABLET | Freq: Every day | ORAL | 0 refills | Status: DC
Start: 2020-06-02 — End: 2020-06-29

## 2020-06-02 MED ORDER — CITALOPRAM HYDROBROMIDE 20 MG PO TABS
20.0000 mg | ORAL_TABLET | Freq: Every day | ORAL | 0 refills | Status: DC
Start: 2020-06-02 — End: 2020-06-29

## 2020-06-12 ENCOUNTER — Ambulatory Visit (INDEPENDENT_AMBULATORY_CARE_PROVIDER_SITE_OTHER): Payer: Medicare Other

## 2020-06-12 ENCOUNTER — Other Ambulatory Visit: Payer: Self-pay

## 2020-06-12 DIAGNOSIS — Z1231 Encounter for screening mammogram for malignant neoplasm of breast: Secondary | ICD-10-CM

## 2020-06-18 ENCOUNTER — Telehealth: Payer: Self-pay | Admitting: Family Medicine

## 2020-06-18 NOTE — Telephone Encounter (Signed)
Patient returned call for mammogram results. I relayed Dr. Alan Ripper message: "Mammogram is normal." Patient voiced happy understanding.

## 2020-06-18 NOTE — Telephone Encounter (Signed)
noted 

## 2020-06-25 ENCOUNTER — Ambulatory Visit: Payer: Medicare Other

## 2020-06-29 ENCOUNTER — Other Ambulatory Visit: Payer: Self-pay

## 2020-06-29 ENCOUNTER — Ambulatory Visit (INDEPENDENT_AMBULATORY_CARE_PROVIDER_SITE_OTHER): Payer: Medicare Other | Admitting: Family Medicine

## 2020-06-29 ENCOUNTER — Encounter: Payer: Self-pay | Admitting: Family Medicine

## 2020-06-29 VITALS — BP 106/71 | HR 60 | Temp 98.2°F | Ht 63.0 in | Wt 191.0 lb

## 2020-06-29 DIAGNOSIS — I519 Heart disease, unspecified: Secondary | ICD-10-CM | POA: Diagnosis not present

## 2020-06-29 DIAGNOSIS — I10 Essential (primary) hypertension: Secondary | ICD-10-CM

## 2020-06-29 DIAGNOSIS — K219 Gastro-esophageal reflux disease without esophagitis: Secondary | ICD-10-CM

## 2020-06-29 DIAGNOSIS — E119 Type 2 diabetes mellitus without complications: Secondary | ICD-10-CM | POA: Diagnosis not present

## 2020-06-29 DIAGNOSIS — E6609 Other obesity due to excess calories: Secondary | ICD-10-CM

## 2020-06-29 DIAGNOSIS — N951 Menopausal and female climacteric states: Secondary | ICD-10-CM

## 2020-06-29 DIAGNOSIS — Z6834 Body mass index (BMI) 34.0-34.9, adult: Secondary | ICD-10-CM

## 2020-06-29 DIAGNOSIS — E785 Hyperlipidemia, unspecified: Secondary | ICD-10-CM

## 2020-06-29 LAB — POCT GLYCOSYLATED HEMOGLOBIN (HGB A1C)
HbA1c POC (<> result, manual entry): 5.8 % (ref 4.0–5.6)
HbA1c, POC (controlled diabetic range): 5.8 % (ref 0.0–7.0)
HbA1c, POC (prediabetic range): 5.8 % (ref 5.7–6.4)
Hemoglobin A1C: 5.8 % — AB (ref 4.0–5.6)

## 2020-06-29 MED ORDER — OMEPRAZOLE 20 MG PO CPDR: 20.0000 mg | DELAYED_RELEASE_CAPSULE | Freq: Every day | ORAL | 1 refills | Status: DC

## 2020-06-29 MED ORDER — LINAGLIPTIN 5 MG PO TABS: 5.0000 mg | ORAL_TABLET | Freq: Every day | ORAL | 1 refills | Status: DC

## 2020-06-29 MED ORDER — METFORMIN HCL ER 750 MG PO TB24: 750.0000 mg | ORAL_TABLET | Freq: Every day | ORAL | 1 refills | Status: DC

## 2020-06-29 MED ORDER — CANDESARTAN CILEXETIL-HCTZ 16-12.5 MG PO TABS: 1.0000 | ORAL_TABLET | Freq: Every day | ORAL | 1 refills | Status: DC

## 2020-06-29 MED ORDER — CITALOPRAM HYDROBROMIDE 20 MG PO TABS: 20.0000 mg | ORAL_TABLET | Freq: Every day | ORAL | 1 refills | Status: DC

## 2020-06-29 MED ORDER — METOPROLOL SUCCINATE ER 25 MG PO TB24: 25.0000 mg | ORAL_TABLET | Freq: Every day | ORAL | 1 refills | Status: DC

## 2020-06-29 NOTE — Patient Instructions (Signed)
Next aptt schedule as your physcial 7/28 or week after. We will perform fasting labs this appt also.    Your a1c and bp look great today!

## 2020-06-29 NOTE — Progress Notes (Signed)
This visit occurred during the SARS-CoV-2 public health emergency.  Safety protocols were in place, including screening questions prior to the visit, additional usage of staff PPE, and extensive cleaning of exam room while observing appropriate contact time as indicated for disinfecting solutions.    Patient ID: Carol [name], female  DOB: 1955-04-15, 65 y.o.   MRN: [id number] Patient Care Team    Relationship Specialty Notifications Start End  Natalia Leatherwood, DO PCP - General Family Medicine  06/10/19   Jake Bathe, MD Consulting Physician Cardiology  06/12/19   Iva Boop, MD Consulting Physician Gastroenterology  06/12/19   Elwin Mocha, MD Consulting Physician Ophthalmology  06/12/19   Frankey Poot, MD Referring Physician Obstetrics and Gynecology  06/12/19   Jessica Priest, MD Consulting Physician Allergy and Immunology  06/12/19     Chief Complaint  Patient presents with  . Follow-up    [facility]; pt is not fasting    Subjective: Carol [name] is a 65 y.o.  Female  present for Type 2 diabetes mellitus without complication, without long-term current use of insulin (HCC)/obesity Pt reports compliance with metformin 750 mg daily and tradjenta 5 mg QD. Patient denies dizziness, hyperglycemic or hypoglycemic events.Patient denies dizziness, hyperglycemic or hypoglycemic events. Patient denies numbness, tingling in the extremities or nonhealing wounds of feet.  PNA series: prevnar 03/11/2020  Flu shot: Up-to-date 2021 (recommneded yearly) Foot exam:  Completed 10/2019 Eye exam: Up-to-date 02/05/2020, Dr. Sherryll Burger will request records again (2nd request) A1c: 6.9> 6.1>7.2>5.8> 5.8 collected today  Essential hypertension/HTN Pt reports compliance  with Toprol-XL 25, candesartan-HCTZ 16-12.5 mg daily, Crestor 40 mg daily and baby aspirin. Blood pressures ranges at home within normal limits. Patient denies chest pain, shortness of breath, dizziness or lower extremity edema.   Pt takes a  daily baby ASA. Pt is  prescribed statin. RF: Hypertension, hyperlipidemia, diabetic, obesity, family history.   Gastroesophageal reflux disease without esophagitis Patient reports symptoms are well controlled on  her omeprazole 20 mg daily.  She has been unable to discontinue medication in the past without recurrence of symptoms immediately.  Hot flashes: Patient reports celexa 20 mg qd has been very helpfull with her hot flashes.   Depression screen [facility] 2/9 06/29/2020 05/25/2020 11/05/2019 06/10/2019  Decreased Interest 1 0 0 0  Down, Depressed, Hopeless 0 0 0 0  PHQ - 2 Score 1 0 0 0  Altered sleeping 3 - - -  Tired, decreased energy 1 - - -  Change in appetite 0 - - -  Feeling bad or failure about yourself  0 - - -  Trouble concentrating 0 - - -  Moving slowly or fidgety/restless 0 - - -  Suicidal thoughts 0 - - -  PHQ-9 Score 5 - - -   GAD 7 : Generalized Anxiety Score 06/29/2020  Nervous, Anxious, on Edge 1  Control/stop worrying 0  Worry too much - different things 0  Trouble relaxing 0  Restless 0  Easily annoyed or irritable 0  Afraid - awful might happen 0  Total GAD 7 Score 1     Immunization History  Administered Date(s) Administered  . Fluad Quad(high Dose 65+) 02/21/2020  . Influenza,inj,Quad PF,6+ Mos 03/30/2019  . PFIZER(Purple Top)SARS-COV-2 Vaccination 07/02/2019, 07/30/2019, 01/19/2020  . Pneumococcal Conjugate-13 03/11/2020  . Tdap 08/27/2012  . Zoster Recombinat (Shingrix) 11/05/2019, 02/03/2020   Past Medical History:  Diagnosis Date  . Angio-edema   . Chest discomfort    ,  Atypical  . Chicken pox   . COVID-19 05/09/2019  . Diabetic acidosis, type II (HCC)   . Diverticulitis - large vs small bowel vs both?   . Gastroesophageal reflux disease   . Glaucoma   . Hyperlipidemia   . Hypertension   . PVC (premature ventricular contraction)   . Systolic dysfunction    , Hyperdynamic   Allergies  Allergen Reactions  . Ivp Dye [Iodinated Diagnostic  Agents] Hives and Shortness Of Breath    Did ok in OP setting with 13 hr Premedications   . Latanoprost   . Lisinopril Cough  . Onion Rash    Rash and GI upset  . Sulfa Antibiotics Hives   Past Surgical History:  Procedure Laterality Date  . APPENDECTOMY N/A 11/05/2013   Procedure: APPENDECTOMY;  Surgeon: Cherylynn Ridges, MD;  Location: [facility] OR;  Service: General;  Laterality: N/A;  . 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 CARDIAC CATHETERIZATION  2010   Patient reports cardiac cath approximately 2010.  "Normal "performed by Dr. Ty Hilts  . COLONOSCOPY  04/03/2019  . LAPAROTOMY N/A 11/05/2013   Procedure: EXPLORATORY LAPAROTOMY;  Surgeon: Cherylynn Ridges, MD;  [facility];  Service: General;  Laterality: N/A;  Carol [name]   Patient reports normal Lexiscan completed around 2010 by Dr. Ty Hilts.  Marland Kitchen TOTAL ABDOMINAL HYSTERECTOMY  2004   Family History  Problem Relation Age of Onset  . Heart disease Mother   . Heart attack Mother   . Asthma Mother   . Early death Mother   . Miscarriages / [place] Mother   . Heart disease Father   . Lung cancer Father   . Heart attack Brother   . Asthma Brother   . Heart disease Brother   . Kidney disease Brother   . Diverticulitis Sister   . Heart disease Brother   . Heart attack Brother   . Heart disease Brother   . Heart attack Brother   . Early death Maternal Grandmother   . Tuberculosis Maternal Grandmother   . Depression Maternal Grandfather   . Heart disease Paternal Grandmother   . Heart disease Sister   . Colon cancer Neg Hx   . Esophageal cancer Neg Hx   . Rectal cancer Neg Hx   . Stomach cancer Neg Hx    Social History   Social History Narrative   Marital status/children/pets: Married.   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   Safety:      -smoke alarm in the home:Yes     - wears seatbelt: Yes     - Feels safe in their relationships: Yes     Allergies as of 06/29/2020      Reactions   Ivp Dye [iodinated Diagnostic Agents] Hives, Shortness Of Breath   Did ok in OP setting with 13 hr Premedications    Latanoprost    Lisinopril Cough   Onion Rash   Rash and GI upset   Sulfa Antibiotics Hives      Medication List       Accurate as of June 29, 2020 10:50 AM. If you have any questions, ask your nurse or doctor.        aspirin 81 MG tablet Take 81 mg by mouth at bedtime.   candesartan-hydrochlorothiazide 16-12.5 MG tablet Commonly known as: ATACAND HCT Take 1 tablet by mouth daily.   cetirizine 10 MG tablet Commonly known as: ZYRTEC Take 10  mg by mouth at bedtime.   citalopram 20 MG tablet Commonly known as: CELEXA Take 1 tablet (20 mg total) by mouth daily.   clobetasol cream 0.05 % Commonly known as: TEMOVATE Apply 1 application topically 2 (two) times daily. PRN   linagliptin 5 MG Tabs tablet Commonly known as: TRADJENTA Take 1 tablet (5 mg total) by mouth daily.   metFORMIN 750 MG 24 hr tablet Commonly known as: GLUCOPHAGE-XR Take 1 tablet (750 mg total) by mouth daily with breakfast.   metoprolol succinate 25 MG 24 hr tablet Commonly known as: TOPROL-XL Take 1 tablet (25 mg total) by mouth daily. Take with or immediately following a meal.   omeprazole 20 MG capsule Commonly known as: PRILOSEC Take 1 capsule (20 mg total) by mouth daily.   OneTouch Delica Plus Lancet33G Misc check blood sugar TWICE DAILY   OneTouch Ultra test strip Generic drug: glucose blood test sugars TWICE DAILY as directed   rosuvastatin 40 MG tablet Commonly known as: CRESTOR Take 1 tablet (40 mg total) by mouth at bedtime.   Timolol Maleate (Once-Daily) 0.5 % Soln Place 1 drop into both eyes 2 (two) times daily.   Travoprost (BAK Free) 0.004 % Soln ophthalmic solution Commonly known as: TRAVATAN Place 1 drop into both eyes at bedtime.       All past medical history, surgical history, allergies, family  history, immunizations andmedications were updated in the EMR today and reviewed under the history and medication portions of their EMR.      ROS: 14 pt review of systems performed and negative (unless mentioned in an HPI)  Objective: BP 106/71   Pulse 60   Temp 98.2 F (36.8 C) (Oral)   Ht 5\' 3"  (1.6 m)   Wt 191 lb (86.6 kg)   SpO2 97%   BMI 33.83 kg/m  Gen: Afebrile. No acute distress. Nontoxic, pleasant obese female.  HENT: AT. [place].no cough.  Eyes:Pupils Equal Round Reactive to light, Extraocular movements intact,  Conjunctiva without redness, discharge or icterus. Neck/lymp/endocrine: Supple,no lymphadenopathy, no thyromegaly CV: RRR 1/6 SM, no edema, +2/4 P posterior tibialis pulses Chest: CTAB, no wheeze or crackles Abd: Soft.. NTND. BS present.  Skin: no rashes, purpura or petechiae.  Neuro:  Normal gait. PERLA. EOMi. Alert. Oriented x3 Psych: Normal affect, dress and demeanor. Normal speech. Normal thought content and judgment.  Results for orders placed or performed in visit on 06/29/20 (from the past 24 hour(s))  POCT HgB A1C     Status: Abnormal   Collection Time: 06/29/20 10:39 AM  Result Value Ref Range   Hemoglobin A1C 5.8 (A) 4.0 - 5.6 %   HbA1c POC (<> result, manual entry) 5.8 4.0 - 5.6 %   HbA1c, POC (prediabetic range) 5.8 5.7 - 6.4 %   HbA1c, POC (controlled diabetic range) 5.8 0.0 - 7.0 %    Assessment/plan: ANJOLI DIEMER is a 65 y.o. female present for CPE  Type 2 diabetes mellitus without complication, without long-term current use of insulin (HCC)/obesity Stable.  Continue Metformin 750 mg daily> may be able to decrease next visit Continue  tradjenta 5 mg  Continue monitoring blood glucose daily PNA series: prevnar completed Flu shot: Up-to-date 2021 (recommneded yearly) Foot exam:  Completed 10/2019 Eye exam: Up-to-date 02/05/2020, Dr. 02/07/2020 A1c: 6.9> 6.1>7.2>5.8> 5.8 collected today -Follow-up every 4 months  Essential  hypertension/HLD/obesity Stable.  Continue  metoprolol XL at a decreased dose of half tab daily (25 mg dose) Continue  candesartan/HCTZ 16-12.5 mg Continue  Crestor 40 mg daily Continue baby aspirin daily Follow-up every 4 months with chronic conditions  Gastroesophageal reflux disease without esophagitis Stable.  Continue   omeprazole 20 mg daily  Hot flashes: stable Continue  Celexa 20 mg daily.   Return in about 4 months (around 11/05/2020) for CPE (30 min), [facility] (30 min). with fasting labs.  Orders Placed This Encounter  Procedures  . POCT HgB A1C   Meds ordered this encounter  Medications  . citalopram (CELEXA) 20 MG tablet    Sig: Take 1 tablet (20 mg total) by mouth daily.    Dispense:  90 tablet    Refill:  1  . candesartan-hydrochlorothiazide (ATACAND HCT) 16-12.5 MG tablet    Sig: Take 1 tablet by mouth daily.    Dispense:  90 tablet    Refill:  1  . omeprazole (PRILOSEC) 20 MG capsule    Sig: Take 1 capsule (20 mg total) by mouth daily.    Dispense:  90 capsule    Refill:  1  . metoprolol succinate (TOPROL-XL) 25 MG 24 hr tablet    Sig: Take 1 tablet (25 mg total) by mouth daily. Take with or immediately following a meal.    Dispense:  90 tablet    Refill:  1  . metFORMIN (GLUCOPHAGE-XR) 750 MG 24 hr tablet    Sig: Take 1 tablet (750 mg total) by mouth daily with breakfast.    Dispense:  90 tablet    Refill:  1  . linagliptin (TRADJENTA) 5 MG TABS tablet    Sig: Take 1 tablet (5 mg total) by mouth daily.    Dispense:  90 tablet    Refill:  1    May be dispensed in 30-day or 90-day scripts pending upon patient preference and insurance.  Total supply 6 months.   Referral Orders  No referral(s) requested today    Electronically signed by: Felix Pacinienee Westen Dinino, DO [facility] [place]

## 2020-08-11 LAB — HM DIABETES EYE EXAM

## 2020-09-10 ENCOUNTER — Encounter: Payer: Self-pay | Admitting: Family Medicine

## 2020-09-10 ENCOUNTER — Telehealth (INDEPENDENT_AMBULATORY_CARE_PROVIDER_SITE_OTHER): Payer: Medicare Other | Admitting: Family Medicine

## 2020-09-10 VITALS — Wt 186.0 lb

## 2020-09-10 DIAGNOSIS — R319 Hematuria, unspecified: Secondary | ICD-10-CM | POA: Diagnosis not present

## 2020-09-10 DIAGNOSIS — R300 Dysuria: Secondary | ICD-10-CM

## 2020-09-10 MED ORDER — NITROFURANTOIN MONOHYD MACRO 100 MG PO CAPS: 100.0000 mg | ORAL_CAPSULE | Freq: Two times a day (BID) | ORAL | 0 refills | Status: DC

## 2020-09-10 NOTE — Progress Notes (Signed)
Virtual Visit via Telephone Note  I connected with Carol [name] on 09/10/20 at  4:00 PM EDT by telephone and verified that I am speaking with the correct person using two identifiers.   I discussed the limitations, risks, security and privacy concerns of performing an evaluation and management service by telephone and the availability of in person appointments. I also discussed with the patient that there may be a patient responsible charge related to this service. The patient expressed understanding and agreed to proceed.  Location patient: [place] Location provider: work or home office Participants present for the call: patient, provider Patient did not have a visit with me in the prior 7 days to address this/these issue(s).   History of Present Illness:  Acute telemedicine visit for Dysuria: -Onset: 2-3 days ago -Symptoms include: dysuria, urgency, frequency, hematuria, some low back pain -Denies: denies fevers, NV, flank pain, vaginal discharge, new sexual partners/concern for STI -Pertinent past medical history: hx UTI -Pertinent medication allergies: Allergies  Allergen Reactions  . Ivp Dye [Iodinated Diagnostic Agents] Hives and Shortness Of Breath    Did ok in OP setting with 13 hr Premedications   . Latanoprost   . Lisinopril Cough  . Onion Rash    Rash and GI upset  . Sulfa Antibiotics Hives   Observations/Objective: Patient sounds cheerful and well on the phone. I do not appreciate any SOB. Speech and thought processing are grossly intact. Patient reported vitals:  Assessment and Plan:  Dysuria  Hematuria, unspecified type  -we discussed possible serious and likely etiologies, options for evaluation and workup, limitations of telemedicine visit vs in person visit, treatment, treatment risks and precautions. Pt prefers to treat via telemedicine empirically rather than in person at this moment. She has a hx of UTI and this feels similar. She prefers to try empiric  treatment with abx for possible cystitis. Macrobid sent. Given two episodes of hematuria in the last several months did advise PCP follow up and advised she may need to see a urologist. Pt agrees to call to schedule after this visit. Scheduled follow up with PCP offered: pt prefers to call PCP office to schedule follow up in 1-2 weeks Advised to seek prompt in person care if worsening, new symptoms arise, or if is not improving with treatment. Advised of options for inperson care in case PCP office not available. Did let the patient know that I only do telemedicine shifts for [place] on Tuesdays and Thursdays and advised a follow up visit with PCP or at an [facility] if has further questions or concerns.   Follow Up Instructions:  I did not refer this patient for an OV with me in the next 24 hours for this/these issue(s).  I discussed the assessment and treatment plan with the patient. The patient was provided an opportunity to ask questions and all were answered. The patient agreed with the plan and demonstrated an understanding of the instructions.   I spent 12 minutes on the date of this visit in the care of this patient. See summary of tasks completed to properly care for this patient in the detailed notes above which also included counseling of above, review of PMH, medications, allergies, evaluation of the patient and ordering and/or  instructing patient on testing and care options.     Terressa Koyanagi, DO

## 2020-09-10 NOTE — Patient Instructions (Signed)
-  I sent the medication(s) we discussed to your pharmacy: Meds ordered this encounter  Medications  . nitrofurantoin, macrocrystal-monohydrate, (MACROBID) 100 MG capsule    Sig: Take 1 capsule (100 mg total) by mouth 2 (two) times daily.    Dispense:  14 capsule    Refill:  0   Schedule follow up with your primary care office in 1-2 weeks in person.   I hope you are feeling better soon!  Seek in person care promptly if your symptoms worsen, new concerns arise or you are not improving with treatment.  It was nice to meet you today. I help [facility] out with telemedicine visits on Tuesdays and Thursdays and am available for visits on those days. If you have any concerns or questions following this visit please schedule a follow up visit with your Primary Care doctor or seek care at a local urgent care clinic to avoid delays in care.

## 2020-09-11 ENCOUNTER — Other Ambulatory Visit (INDEPENDENT_AMBULATORY_CARE_PROVIDER_SITE_OTHER): Payer: Medicare Other

## 2020-09-11 DIAGNOSIS — R300 Dysuria: Secondary | ICD-10-CM

## 2020-09-11 LAB — POCT URINALYSIS DIPSTICK
Bilirubin, UA: NEGATIVE
Glucose, UA: NEGATIVE
Ketones, UA: NEGATIVE
Nitrite, UA: POSITIVE
Protein, UA: POSITIVE — AB
Spec Grav, UA: >=1.03 — AB (ref 1.010–1.025)
Urobilinogen, UA: 1 E.U./dL
pH, UA: 5.5 (ref 5.0–8.0)

## 2020-09-22 ENCOUNTER — Other Ambulatory Visit: Payer: Self-pay

## 2020-09-22 ENCOUNTER — Encounter: Payer: Self-pay | Admitting: Family Medicine

## 2020-09-22 ENCOUNTER — Ambulatory Visit (INDEPENDENT_AMBULATORY_CARE_PROVIDER_SITE_OTHER): Payer: Medicare Other | Admitting: Family Medicine

## 2020-09-22 VITALS — BP 109/68 | HR 71 | Temp 98.6°F | Wt 193.0 lb

## 2020-09-22 DIAGNOSIS — N3000 Acute cystitis without hematuria: Secondary | ICD-10-CM | POA: Diagnosis not present

## 2020-09-22 DIAGNOSIS — R319 Hematuria, unspecified: Secondary | ICD-10-CM

## 2020-09-22 LAB — POCT URINALYSIS DIPSTICK
Bilirubin, UA: NEGATIVE
Blood, UA: NEGATIVE
Glucose, UA: NEGATIVE
Ketones, UA: NEGATIVE
Leukocytes, UA: NEGATIVE
Nitrite, UA: NEGATIVE
Protein, UA: NEGATIVE
Spec Grav, UA: 1.025 (ref 1.010–1.025)
Urobilinogen, UA: 1 E.U./dL
pH, UA: 6 (ref 5.0–8.0)

## 2020-09-22 NOTE — Patient Instructions (Signed)
Great to see you.  I will call you with results.

## 2020-09-22 NOTE — Progress Notes (Signed)
This visit occurred during the SARS-CoV-2 public health emergency.  Safety protocols were in place, including screening questions prior to the visit, additional usage of staff PPE, and extensive cleaning of exam room while observing appropriate contact time as indicated for disinfecting solutions.    Carol [name] , Sep 09, 1955, 65 y.o., female MRN: [id number] Patient Care Team    Relationship Specialty Notifications Start End  Natalia Leatherwood, DO PCP - General Family Medicine  06/10/19   Jake Bathe, MD Consulting Physician Cardiology  06/12/19   Iva Boop, MD Consulting Physician Gastroenterology  06/12/19   Elwin Mocha, MD Consulting Physician Ophthalmology  06/12/19   Frankey Poot, MD Referring Physician Obstetrics and Gynecology  06/12/19   Jessica Priest, MD Consulting Physician Allergy and Immunology  06/12/19     Chief Complaint  Patient presents with   Urinary Tract Infection    Urine recheck     Subjective: Carol [name] is a 65 y.o. female present for UTI recheck.  She reports she was seen by telehealth visit approximately 2 weeks ago and diagnosed with a UTI.  She was treated with Macrobid twice daily.  She admits she missed read the label and has only been taking Macrobid 1 times a day.  She does state her symptoms are resolved.  She denies any fever, chills, nausea or vomit.  She is no longer having any dysuria or hematuria.  She does note some mild lower back pain which has her concerned for any type of kidney dysfunction.  Depression screen [facility] 2/9 06/29/2020 05/25/2020 11/05/2019 06/10/2019  Decreased Interest 1 0 0 0  Down, Depressed, Hopeless 0 0 0 0  PHQ - 2 Score 1 0 0 0  Altered sleeping 3 - - -  Tired, decreased energy 1 - - -  Change in appetite 0 - - -  Feeling bad or failure about yourself  0 - - -  Trouble concentrating 0 - - -  Moving slowly or fidgety/restless 0 - - -  Suicidal thoughts 0 - - -  PHQ-9 Score 5 - - -    Allergies  Allergen  Reactions   Ivp Dye [Iodinated Diagnostic Agents] Hives and Shortness Of Breath    Did ok in OP setting with 13 hr Premedications    Latanoprost    Lisinopril Cough   Onion Rash    Rash and GI upset   Sulfa Antibiotics Hives   Social History   Social History Narrative   Marital status/children/pets: Married.   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   Safety:      -smoke alarm in the home:Yes     - wears seatbelt: Yes     - Feels safe in their relationships: Yes   Past Medical History:  Diagnosis Date   Angio-edema    Chest discomfort    , Atypical   Chicken pox    COVID-19 05/09/2019   Diabetic acidosis, type II (HCC)    Diverticulitis - large vs small bowel vs both?    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  "Normal "performed by Dr. Ty Hilts   COLONOSCOPY  04/03/2019   LAPAROTOMY N/A 11/05/2013   Procedure: EXPLORATORY LAPAROTOMY;  Surgeon: Cherylynn Ridges, MD;  Location: [facility] OR;  Service: General;  Laterality: N/A;   Lexiscan  2010   Patient reports normal Lexiscan completed around 2010 by Dr. Ty Hilts.   TOTAL ABDOMINAL HYSTERECTOMY  2004   Family History  Problem Relation Age of Onset   Heart disease Mother    Heart attack Mother    Asthma Mother    Early death Mother    Miscarriages / Stillbirths Mother    Heart disease Father    Lung cancer Father    Heart attack Brother    Asthma Brother    Heart disease Brother    Kidney disease Brother    Diverticulitis  Sister    Heart disease Brother    Heart attack Brother    Heart disease Brother    Heart attack Brother    Early death Maternal Grandmother    Tuberculosis Maternal Grandmother    Depression Maternal Grandfather    Heart disease Paternal Grandmother    Heart disease Sister    Colon cancer Neg Hx    Esophageal cancer Neg Hx    Rectal cancer Neg Hx    Stomach cancer Neg Hx    Allergies as of 09/22/2020       Reactions   Ivp Dye [iodinated Diagnostic Agents] Hives, Shortness Of Breath   Did ok in OP setting with 13 hr Premedications    Latanoprost    Lisinopril Cough   Onion Rash   Rash and GI upset   Sulfa Antibiotics Hives        Medication List        Accurate as of September 22, 2020  4:55 PM. If you have any questions, ask your nurse or doctor.          aspirin 81 MG tablet Take 81 mg by mouth at bedtime.   candesartan-hydrochlorothiazide 16-12.5 MG tablet Commonly known as: ATACAND HCT Take 1 tablet by mouth daily.   cetirizine 10 MG tablet Commonly known as: ZYRTEC Take 10 mg by mouth at bedtime.   citalopram 20 MG tablet Commonly known as: CELEXA Take 1 tablet (20 mg total) by mouth daily.   linagliptin 5 MG Tabs tablet Commonly known as: TRADJENTA Take 1 tablet (5 mg total) by mouth daily.   metFORMIN 750 MG 24 hr tablet Commonly known as: GLUCOPHAGE-XR Take 1 tablet (750 mg total) by mouth daily with breakfast.   metoprolol succinate 25 MG 24 hr tablet Commonly known as: TOPROL-XL Take 1 tablet (25 mg total) by mouth daily. Take with or immediately following a meal.   nitrofurantoin (macrocrystal-monohydrate) 100 MG capsule Commonly known as: Macrobid Take 1 capsule (100 mg total) by mouth 2 (two) times daily.   omeprazole 20 MG capsule Commonly known as: PRILOSEC Take 1 capsule (20 mg total) by mouth daily.   OneTouch Delica Plus Lancet33G Misc check blood sugar TWICE DAILY   OneTouch Ultra test strip Generic drug: glucose blood test  sugars TWICE DAILY as directed   rosuvastatin 40 MG tablet Commonly known as: CRESTOR Take 1 tablet (40 mg total) by mouth at bedtime.   timolol 0.25 % ophthalmic solution Commonly known as: TIMOPTIC Place 1 drop into the left eye 2 (two) times  daily. What changed: Another medication with the same name was removed. Continue taking this medication, and follow the directions you see here. Changed by: Felix Pacini, DO   Travoprost (BAK Free) 0.004 % Soln ophthalmic solution Commonly known as: TRAVATAN Place 1 drop into both eyes at bedtime.        All past medical history, surgical history, allergies, family history, immunizations andmedications were updated in the EMR today and reviewed under the history and medication portions of their EMR.     ROS: Negative, with the exception of above mentioned in HPI   Objective:  BP 109/68   Pulse 71   Temp 98.6 F (37 C) (Oral)   Wt 193 lb (87.5 kg)   SpO2 97%   BMI 34.19 kg/m  Body mass index is 34.19 kg/m. Gen: Afebrile. No acute distress. Nontoxic in appearance, well developed, well nourished.  HENT: AT. [place].  Eyes:Pupils Equal Round Reactive to light, Extraocular movements intact,  Conjunctiva without redness, discharge or icterus. CV: RRR  Chest: CTAB, no wheeze or crackles.  Abd: Soft.. NTND. BS present.  MSK: No CVA tenderness bilaterally Neuro:Normal gait. PERLA. EOMi. Alert. Oriented x3    No results found. No results found. Results for orders placed or performed in visit on 09/22/20 (from the past 24 hour(s))  POCT Urinalysis Dipstick     Status: None   Collection Time: 09/22/20  2:38 PM  Result Value Ref Range   Color, UA yellow    Clarity, UA clear    Glucose, UA Negative Negative   Bilirubin, UA Negative    Ketones, UA Negative    Spec Grav, UA 1.025 1.010 - 1.025   Blood, UA negative    pH, UA 6.0 5.0 - 8.0   Protein, UA Negative Negative   Urobilinogen, UA 1.0 0.2 or 1.0 E.U./dL   Nitrite, UA negative     Leukocytes, UA Negative Negative   Appearance     Odor      Assessment/Plan: Carol [name] is a 65 y.o. female present for OV for  Acute cystitis with hematuria Vital signs are stable.  Point-of-care urine urinalysis appears normal today. The area of discomfort in her back is more over her SI joint.  She has negative CVA tenderness bilaterally. -Sent for urine culture to ensure complete treatment achieved. - POCT Urinalysis Dipstick - Basic Metabolic Panel (BMET) Follow-up as needed   Reviewed expectations re: course of current medical issues. Discussed self-management of symptoms. Outlined signs and symptoms indicating need for more acute intervention. Patient verbalized understanding and all questions were answered. Patient received an After-Visit Summary.    Orders Placed This Encounter  Procedures   Urinalysis w microscopic + reflex cultur   Basic Metabolic Panel (BMET)   POCT Urinalysis Dipstick   No orders of the defined types were placed in this encounter.  Referral Orders  No referral(s) requested today     Note is dictated utilizing voice recognition software. Although note has been proof read prior to signing, occasional typographical errors still can be missed. If any questions arise, please do not hesitate to call for verification.   electronically signed by:  Felix Pacini, DO  [facility] - OR

## 2020-09-23 LAB — URINALYSIS W MICROSCOPIC + REFLEX CULTURE
Bacteria, UA: NONE SEEN /HPF
Bilirubin Urine: NEGATIVE
Glucose, UA: NEGATIVE
Hgb urine dipstick: NEGATIVE
Hyaline Cast: NONE SEEN /LPF
Ketones, ur: NEGATIVE
Leukocyte Esterase: NEGATIVE
Nitrites, Initial: NEGATIVE
Protein, ur: NEGATIVE
RBC / HPF: NONE SEEN /HPF (ref 0–2)
Specific Gravity, Urine: 1.01 (ref 1.001–1.035)
Squamous Epithelial / HPF: NONE SEEN /HPF (ref <=5)
WBC, UA: NONE SEEN /HPF (ref 0–5)
pH: 5.5 (ref 5.0–8.0)

## 2020-09-23 LAB — BASIC METABOLIC PANEL
BUN: 19 mg/dL (ref 7–25)
CO2: 27 mmol/L (ref 20–32)
Calcium: 10.4 mg/dL (ref 8.6–10.4)
Chloride: 100 mmol/L (ref 98–110)
Creat: 0.68 mg/dL (ref 0.50–0.99)
Glucose, Bld: 106 mg/dL — ABNORMAL HIGH (ref 65–99)
Potassium: 4.5 mmol/L (ref 3.5–5.3)
Sodium: 138 mmol/L (ref 135–146)

## 2020-09-23 LAB — NO CULTURE INDICATED

## 2020-10-29 ENCOUNTER — Other Ambulatory Visit: Payer: Self-pay | Admitting: Family Medicine

## 2020-11-04 ENCOUNTER — Telehealth: Payer: Self-pay

## 2020-11-04 NOTE — Telephone Encounter (Signed)
Patient was called to remind her of CPE scheduled tomorrow morning.  When patient was asked screening questions, her answers were: No exposure to COVID / small sinus drainage since Sunday 7/24, no fever, no congestion, no other symptoms Sinus drainage is NOT on list of symptoms. No COVID testing  Patient can be reached at [phone].  Please ask Dr. Claiborne Billings if approved for in office visit.

## 2020-11-04 NOTE — Telephone Encounter (Signed)
Spoke with provider and Annabelle Harman, pt is to be resched to next due to recent onset of sx

## 2020-11-05 ENCOUNTER — Encounter: Payer: Medicare Other | Admitting: Family Medicine

## 2020-11-18 ENCOUNTER — Other Ambulatory Visit: Payer: Self-pay

## 2020-11-18 ENCOUNTER — Encounter: Payer: Self-pay | Admitting: Family Medicine

## 2020-11-18 ENCOUNTER — Ambulatory Visit (INDEPENDENT_AMBULATORY_CARE_PROVIDER_SITE_OTHER): Payer: Medicare Other | Admitting: Family Medicine

## 2020-11-18 VITALS — BP 107/66 | HR 60 | Temp 98.5°F | Ht 62.5 in | Wt 190.0 lb

## 2020-11-18 DIAGNOSIS — Z0000 Encounter for general adult medical examination without abnormal findings: Secondary | ICD-10-CM | POA: Diagnosis not present

## 2020-11-18 DIAGNOSIS — E119 Type 2 diabetes mellitus without complications: Secondary | ICD-10-CM

## 2020-11-18 DIAGNOSIS — I10 Essential (primary) hypertension: Secondary | ICD-10-CM | POA: Diagnosis not present

## 2020-11-18 DIAGNOSIS — K219 Gastro-esophageal reflux disease without esophagitis: Secondary | ICD-10-CM

## 2020-11-18 DIAGNOSIS — E6609 Other obesity due to excess calories: Secondary | ICD-10-CM

## 2020-11-18 DIAGNOSIS — Z6834 Body mass index (BMI) 34.0-34.9, adult: Secondary | ICD-10-CM

## 2020-11-18 DIAGNOSIS — Z1231 Encounter for screening mammogram for malignant neoplasm of breast: Secondary | ICD-10-CM

## 2020-11-18 DIAGNOSIS — E1169 Type 2 diabetes mellitus with other specified complication: Secondary | ICD-10-CM | POA: Diagnosis not present

## 2020-11-18 DIAGNOSIS — E785 Hyperlipidemia, unspecified: Secondary | ICD-10-CM

## 2020-11-18 MED ORDER — METOPROLOL SUCCINATE ER 25 MG PO TB24: 25.0000 mg | ORAL_TABLET | Freq: Every day | ORAL | 1 refills | Status: DC

## 2020-11-18 MED ORDER — CANDESARTAN CILEXETIL-HCTZ 16-12.5 MG PO TABS: 1.0000 | ORAL_TABLET | Freq: Every day | ORAL | 1 refills | Status: DC

## 2020-11-18 MED ORDER — OMEPRAZOLE 20 MG PO CPDR: 20.0000 mg | DELAYED_RELEASE_CAPSULE | Freq: Every day | ORAL | 1 refills | Status: DC

## 2020-11-18 MED ORDER — CITALOPRAM HYDROBROMIDE 20 MG PO TABS: 20.0000 mg | ORAL_TABLET | Freq: Every day | ORAL | 1 refills | Status: DC

## 2020-11-18 MED ORDER — ROSUVASTATIN CALCIUM 40 MG PO TABS: 40.0000 mg | ORAL_TABLET | Freq: Every day | ORAL | 3 refills | Status: DC

## 2020-11-18 NOTE — Patient Instructions (Signed)
Health Maintenance, Female Adopting a healthy lifestyle and getting preventive care are important in promoting health and wellness. Ask your health care provider about: The right schedule for you to have regular tests and exams. Things you can do on your own to prevent diseases and keep yourself healthy. What should I know about diet, weight, and exercise? Eat a healthy diet  Eat a diet that includes plenty of vegetables, fruits, low-fat dairy products, and lean protein. Do not eat a lot of foods that are high in solid fats, added sugars, or sodium.  Maintain a healthy weight Body mass index (BMI) is used to identify weight problems. It estimates body fat based on height and weight. Your health care provider can help determineyour BMI and help you achieve or maintain a healthy weight. Get regular exercise Get regular exercise. This is one of the most important things you can do for your health. Most adults should: Exercise for at least 150 minutes each week. The exercise should increase your heart rate and make you sweat (moderate-intensity exercise). Do strengthening exercises at least twice a week. This is in addition to the moderate-intensity exercise. Spend less time sitting. Even light physical activity can be beneficial. Watch cholesterol and blood lipids Have your blood tested for lipids and cholesterol at 66 years of age, then havethis test every 5 years. Have your cholesterol levels checked more often if: Your lipid or cholesterol levels are high. You are older than 66 years of age. You are at high risk for heart disease. What should I know about cancer screening? Depending on your health history and family history, you may need to have cancer screening at various ages. This may include screening for: Breast cancer. Cervical cancer. Colorectal cancer. Skin cancer. Lung cancer. What should I know about heart disease, diabetes, and high blood pressure? Blood pressure and heart  disease High blood pressure causes heart disease and increases the risk of stroke. This is more likely to develop in people who have high blood pressure readings, are of African descent, or are overweight. Have your blood pressure checked: Every 3-5 years if you are 18-39 years of age. Every year if you are 40 years old or older. Diabetes Have regular diabetes screenings. This checks your fasting blood sugar level. Have the screening done: Once every three years after age 40 if you are at a normal weight and have a low risk for diabetes. More often and at a younger age if you are overweight or have a high risk for diabetes. What should I know about preventing infection? Hepatitis B If you have a higher risk for hepatitis B, you should be screened for this virus. Talk with your health care provider to find out if you are at risk forhepatitis B infection. Hepatitis C Testing is recommended for: Everyone born from 1945 through 1965. Anyone with known risk factors for hepatitis C. Sexually transmitted infections (STIs) Get screened for STIs, including gonorrhea and chlamydia, if: You are sexually active and are younger than 66 years of age. You are older than 66 years of age and your health care provider tells you that you are at risk for this type of infection. Your sexual activity has changed since you were last screened, and you are at increased risk for chlamydia or gonorrhea. Ask your health care provider if you are at risk. Ask your health care provider about whether you are at high risk for HIV. Your health care provider may recommend a prescription medicine to help   prevent HIV infection. If you choose to take medicine to prevent HIV, you should first get tested for HIV. You should then be tested every 3 months for as long as you are taking the medicine. Pregnancy If you are about to stop having your period (premenopausal) and you may become pregnant, seek counseling before you get  pregnant. Take 400 to 800 micrograms (mcg) of folic acid every day if you become pregnant. Ask for birth control (contraception) if you want to prevent pregnancy. Osteoporosis and menopause Osteoporosis is a disease in which the bones lose minerals and strength with aging. This can result in bone fractures. If you are 65 years old or older, or if you are at risk for osteoporosis and fractures, ask your health care provider if you should: Be screened for bone loss. Take a calcium or vitamin D supplement to lower your risk of fractures. Be given hormone replacement therapy (HRT) to treat symptoms of menopause. Follow these instructions at home: Lifestyle Do not use any products that contain nicotine or tobacco, such as cigarettes, e-cigarettes, and chewing tobacco. If you need help quitting, ask your health care provider. Do not use street drugs. Do not share needles. Ask your health care provider for help if you need support or information about quitting drugs. Alcohol use Do not drink alcohol if: Your health care provider tells you not to drink. You are pregnant, may be pregnant, or are planning to become pregnant. If you drink alcohol: Limit how much you use to 0-1 drink a day. Limit intake if you are breastfeeding. Be aware of how much alcohol is in your drink. In the [place] one drink equals one 12 oz bottle of beer (355 mL), one 5 oz glass of wine (148 mL), or one 1 oz glass of hard liquor (44 mL). General instructions Schedule regular health, dental, and eye exams. Stay current with your vaccines. Tell your health care provider if: You often feel depressed. You have ever been abused or do not feel safe at home. Summary Adopting a healthy lifestyle and getting preventive care are important in promoting health and wellness. Follow your health care provider's instructions about healthy diet, exercising, and getting tested or screened for diseases. Follow your health care provider's  instructions on monitoring your cholesterol and blood pressure. This information is not intended to replace advice given to you by your health care provider. Make sure you discuss any questions you have with your healthcare provider. Document Revised: 03/21/2018 Document Reviewed: 03/21/2018 Elsevier Patient Education  2022 [employer]  

## 2020-11-18 NOTE — Progress Notes (Signed)
This visit occurred during the SARS-CoV-2 public health emergency.  Safety protocols were in place, including screening questions prior to the visit, additional usage of staff PPE, and extensive cleaning of exam room while observing appropriate contact time as indicated for disinfecting solutions.    Patient ID: Carol SnareJill D Prestage, female  DOB: [birth year], 65 y.o.   MRN: [id number] Patient Care Team    Relationship Specialty Notifications Start End  Natalia LeatherwoodKuneff, Korbin Notaro A, DO PCP - General Family Medicine  06/10/19   Jake BatheSkains, Mark C, MD Consulting Physician Cardiology  06/12/19   Iva BoopGessner, Carl E, MD Consulting Physician Gastroenterology  06/12/19   Elwin MochaShah, Christopher Tulip, MD Consulting Physician Ophthalmology  06/12/19   Frankey Pootamsay, Laura, MD Referring Physician Obstetrics and Gynecology  06/12/19   Jessica PriestKozlow, Eric J, MD Consulting Physician Allergy and Immunology  06/12/19     Chief Complaint  Patient presents with   Annual Exam    Pt is not fasting    Subjective: Carol [name] is a 65 y.o.  Female  present for CPE/CMC. All past medical history, surgical history, allergies, family history, immunizations, medications and social history were updated in the electronic medical record today. All recent labs, ED visits and hospitalizations within the last year were reviewed.  Health maintenance:  Colonoscopy: completed 03/2019-10-year follow-up.  Dr. Leone PayorGessner. Mammogram: completed: 06/12/2020- > ordered for next year Cervical cancer screening: Hysterectomy Immunizations: tdap UTD 2014, Influenza UTD (encouraged yearly), PNA series for complete series after 65 again, Pfizer Covid vaccines completed.  Shingrix series completed.  Infectious disease screening: HIV completed, Hep C completed DEXA:utd 2021- normal Assistive device: None Oxygen use: None Patient has a Dental home. Hospitalizations/ED visits: Reviewed  Type 2 diabetes mellitus without complication, without long-term current use of insulin (HCC)/obesity Pt  reports compliance with metformin 750 mg daily and tradjenta 5 mg QD. Patient denies dizziness, hyperglycemic or hypoglycemic events. Patient denies numbness, tingling in the extremities or nonhealing wounds of feet.  PNA series: Prevnar completed 03/2020-Prevnar 20 due 03/2021 Flu shot: Up-to-date 2021 (recommneded yearly) Foot exam:  Completed 11/2020 Eye exam: Up-to-date 08/2020, Dr. Sherryll BurgerShah> will request records A1c: 6.9> 6.1>7.2>5.8 >collected today   Essential hypertension/HTN Pt reports compliance with Toprol-XL 25, candesartan-HCTZ 16-12.5 mg daily, Crestor 40 mg daily and baby aspirin. Patient denies chest pain, shortness of breath, dizziness or lower extremity edema.   Pt takes a daily baby ASA. Pt is  prescribed statin. RF: Hypertension, hyperlipidemia, diabetic, obesity, family history.    Gastroesophageal reflux disease without esophagitis Patient reports symptoms are well controlled on  her omeprazole 20 mg daily.  She has been unable to discontinue medication in the past without recurrence of symptoms immediately.   Hot flashes: Patient reports celexa 20 mg qd is helping with her hot flashes.  [facility] screen [facility] 11/18/2020 06/29/2020 05/25/2020 11/05/2019 06/10/2019  Decreased Interest 0 1 0 0 0  Down, Depressed, Hopeless 0 0 0 0 0  PHQ - 2 Score 0 1 0 0 0  Altered sleeping - 3 - - -  Tired, decreased energy - 1 - - -  Change in appetite - 0 - - -  Feeling bad or failure about yourself  - 0 - - -  Trouble concentrating - 0 - - -  Moving slowly or fidgety/restless - 0 - - -  Suicidal thoughts - 0 - - -  PHQ-9 Score - 5 - - -   GAD 7 : Generalized Anxiety Score 06/29/2020  Nervous, Anxious, on Edge  1  Control/stop worrying 0  Worry too much - different things 0  Trouble relaxing 0  Restless 0  Easily annoyed or irritable 0  Afraid - awful might happen 0  Total GAD 7 Score 1    Immunization History  Administered Date(s) Administered   Fluad Quad(high Dose 65+)  02/21/2020   Influenza,inj,Quad PF,6+ Mos 03/30/2019   PFIZER(Purple Top)SARS-COV-2 Vaccination 07/02/2019, 07/30/2019, 01/19/2020   Pneumococcal Conjugate-13 03/11/2020   Tdap 08/27/2012   Zoster Recombinat (Shingrix) 11/05/2019, 02/03/2020   Past Medical History:  Diagnosis Date   Angio-edema    Chest discomfort    , Atypical   Chicken pox    COVID-19 05/09/2019   Diabetic acidosis, type II (HCC)    Diverticulitis - large vs small bowel vs both?    Gastroesophageal reflux disease    Glaucoma    Hyperlipidemia    Hypertension    PVC (premature ventricular contraction)    Systolic dysfunction    , Hyperdynamic   Allergies  Allergen Reactions   Ivp Dye [Iodinated Diagnostic Agents] Hives and Shortness Of Breath    Did ok in OP setting with 13 hr Premedications    Latanoprost    Lisinopril Cough   Onion Rash    Rash and GI upset   Sulfa Antibiotics Hives   Past Surgical History:  Procedure Laterality Date   APPENDECTOMY N/A 11/05/2013   Procedure: APPENDECTOMY;  Surgeon: Cherylynn Ridges, MD;  Location: [facility] OR;  Service: General;  Laterality: N/A;   BOWEL RESECTION N/A 11/05/2013   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  "Normal "performed by Dr. Ty Hilts   COLONOSCOPY  04/03/2019   LAPAROTOMY N/A 11/05/2013   Procedure: EXPLORATORY LAPAROTOMY;  Surgeon: Cherylynn Ridges, MD;  Location: [facility] OR;  Service: General;  Laterality: N/A;   Lexiscan  2010   Patient reports normal Lexiscan completed around 2010 by Dr. Ty Hilts.   TOTAL ABDOMINAL HYSTERECTOMY  2004   Family History  Problem Relation Age of Onset   Heart disease Mother    Heart attack Mother    Asthma Mother    Early death Mother    Miscarriages / Stillbirths Mother    Heart disease Father    Lung cancer Father    Heart attack Brother    Asthma Brother    Heart disease Brother    Kidney disease Brother    Diverticulitis Sister    Heart  disease Brother    Heart attack Brother    Heart disease Brother    Heart attack Brother    Early death Maternal Grandmother    Tuberculosis Maternal Grandmother    Depression Maternal Grandfather    Heart disease Paternal Grandmother    Heart disease Sister    Colon cancer Neg Hx    Esophageal cancer Neg Hx    Rectal cancer Neg Hx    Stomach cancer Neg Hx    Social History   Social History Narrative   Marital status/children/pets: Married.   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   Safety:      -smoke alarm in the home:Yes     - wears seatbelt: Yes     - Feels safe in their relationships: Yes    Allergies as of 11/18/2020       Reactions   Ivp Dye [iodinated Diagnostic Agents] Hives, Shortness Of Breath   Did ok in OP setting  with 13 hr Premedications    Latanoprost    Lisinopril Cough   Onion Rash   Rash and GI upset   Sulfa Antibiotics Hives        Medication List        Accurate as of November 18, 2020 11:59 PM. If you have any questions, ask your nurse or doctor.          STOP taking these medications    nitrofurantoin (macrocrystal-monohydrate) 100 MG capsule Commonly known as: Macrobid Stopped by: Felix Pacini, DO       TAKE these medications    aspirin 81 MG tablet Take 81 mg by mouth at bedtime.   candesartan-hydrochlorothiazide 16-12.5 MG tablet Commonly known as: ATACAND HCT Take 1 tablet by mouth daily.   cetirizine 10 MG tablet Commonly known as: ZYRTEC Take 10 mg by mouth at bedtime.   citalopram 20 MG tablet Commonly known as: CELEXA Take 1 tablet (20 mg total) by mouth daily.   linagliptin 5 MG Tabs tablet Commonly known as: TRADJENTA Take 1 tablet (5 mg total) by mouth daily.   metFORMIN 750 MG 24 hr tablet Commonly known as: GLUCOPHAGE-XR Take 1 tablet (750 mg total) by mouth daily with breakfast.   metoprolol succinate 25 MG 24 hr tablet Commonly known as: TOPROL-XL Take 1 tablet  (25 mg total) by mouth daily. Take with or immediately following a meal.   omeprazole 20 MG capsule Commonly known as: PRILOSEC Take 1 capsule (20 mg total) by mouth daily.   OneTouch Delica Plus Lancet33G Misc check blood sugar TWICE DAILY   OneTouch Ultra test strip Generic drug: glucose blood test sugars TWICE DAILY as directed   rosuvastatin 40 MG tablet Commonly known as: CRESTOR Take 1 tablet (40 mg total) by mouth at bedtime.   timolol 0.25 % ophthalmic solution Commonly known as: TIMOPTIC Place 1 drop into the left eye 2 (two) times daily.   Travoprost (BAK Free) 0.004 % Soln ophthalmic solution Commonly known as: TRAVATAN Place 1 drop into both eyes at bedtime.        All past medical history, surgical history, allergies, family history, immunizations andmedications were updated in the EMR today and reviewed under the history and medication portions of their EMR.       ROS: 14 pt review of systems performed and negative (unless mentioned in an HPI)  Objective: BP 107/66   Pulse 60   Temp 98.5 F (36.9 C) (Oral)   Ht 5' 2.5" (1.588 m)   Wt 190 lb (86.2 kg)   SpO2 98%   BMI 34.20 kg/m  Gen: Afebrile. No acute distress. Nontoxic in appearance, well-developed, well-nourished, very pleasant obese female HENT: AT. [place]. Bilateral TM visualized and normal in appearance, normal external auditory canal. MMM, no oral lesions, adequate dentition. Bilateral nares within normal limits. Throat without erythema, ulcerations or exudates.  No cough on exam, no hoarseness on exam. Eyes:Pupils Equal Round Reactive to light, Extraocular movements intact,  Conjunctiva without redness, discharge or icterus. Neck/lymp/endocrine: Supple, no lymphadenopathy, no thyromegaly CV: RRR no murmur, no edema, +2/4 P posterior tibialis pulses.  Chest: CTAB, no wheeze, rhonchi or crackles.  Normal respiratory effort.  Good air movement. Abd: Soft.  Obese. NTND. BS present.  No masses palpated.  No hepatosplenomegaly. No rebound tenderness or guarding. Skin: No rashes, purpura or petechiae. Warm and well-perfused. Skin intact. Neuro/Msk:  Normal gait. PERLA. EOMi. Alert. Oriented x3.  Cranial nerves II through XII intact. Muscle strength 5/5 upper/lower  extremity. DTRs equal bilaterally. Psych: Normal affect, dress and demeanor. Normal speech. Normal thought content and judgment. Diabetic Foot Exam - Simple   Simple Foot Form Diabetic Foot exam was performed with the following findings: Yes 11/18/2020  1:13 PM  Visual Inspection No deformities, no ulcerations, no other skin breakdown bilaterally: Yes Sensation Testing Intact to touch and monofilament testing bilaterally: Yes Pulse Check Posterior Tibialis and Dorsalis pulse intact bilaterally: Yes Comments      No results found.  Assessment/plan: HELMA ARGYLE is a 65 y.o. female present for CPE/CMC Type 2 diabetes mellitus with hyperlipidemia (HCC)/obesity Continue metformin 750 mg daily Continue tradjenta 5 mg  Continue Crestor 40 mg daily Continue monitoring blood glucose daily PNA series: prevnar 13 completed 03/2020 Flu shot: Up-to-date 2021 (recommneded yearly) Foot exam:  Completed 11/2020 Eye exam: Up-to-date 02/05/2020, Dr. Sherryll Burger will request records A1c: 6.9> 6.1>7.2>5.8 > A1c collected today -Follow-up every 4 months   Essential hypertension/HLD/obesity Stable Continue metoprolol XL at a decreased dose of half tab daily (25 mg dose) Continue candesartan/HCTZ 16-12.5 mg Continue Crestor 40 mg daily Continue baby aspirin daily cBC, CMP, TSH and lipids collected today Follow-up every 4 months with chronic conditions   Gastroesophageal reflux disease without esophagitis Stable Continue omeprazole 20 mg daily   Hot flashes: Stable Continue Celexa 20 mg daily  Breast cancer screening by mammogram - MM 3D SCREEN BREAST BILATERAL; Future  Routine general medical examination at a health care  facility Colonoscopy: completed 03/2019-10-year follow-up.  Dr. Leone Payor. Mammogram: completed: 06/12/2020- > ordered for next year Cervical cancer screening: Hysterectomy Immunizations: tdap UTD 2014, Influenza UTD (encouraged yearly), PNA series for complete series after 65 again, Pfizer Covid vaccines completed.  Shingrix series completed.  Infectious disease screening: HIV completed, Hep C completed DEXA:utd 2021- normal Patient was encouraged to exercise greater than 150 minutes a week. Patient was encouraged to choose a diet filled with fresh fruits and vegetables, and lean meats. AVS provided to patient today for education/recommendation on gender specific health and safety maintenance. Return in about 4 months (around 03/20/2021) for CMC (30 min).   Orders Placed This Encounter  Procedures   MM 3D SCREEN BREAST BILATERAL   CBC with Differential/Platelet   Comprehensive metabolic panel   Hemoglobin A1c   Lipid panel   TSH   Meds ordered this encounter  Medications   metoprolol succinate (TOPROL-XL) 25 MG 24 hr tablet    Sig: Take 1 tablet (25 mg total) by mouth daily. Take with or immediately following a meal.    Dispense:  90 tablet    Refill:  1   citalopram (CELEXA) 20 MG tablet    Sig: Take 1 tablet (20 mg total) by mouth daily.    Dispense:  90 tablet    Refill:  1   candesartan-hydrochlorothiazide (ATACAND HCT) 16-12.5 MG tablet    Sig: Take 1 tablet by mouth daily.    Dispense:  90 tablet    Refill:  1   omeprazole (PRILOSEC) 20 MG capsule    Sig: Take 1 capsule (20 mg total) by mouth daily.    Dispense:  90 capsule    Refill:  1   rosuvastatin (CRESTOR) 40 MG tablet    Sig: Take 1 tablet (40 mg total) by mouth at bedtime.    Dispense:  90 tablet    Refill:  3   Referral Orders  No referral(s) requested today     Electronically signed by: Felix Pacini, DO  Primary Care-  Aleatha Borer

## 2020-11-19 ENCOUNTER — Encounter: Payer: Self-pay | Admitting: Family Medicine

## 2020-11-19 ENCOUNTER — Telehealth: Payer: Self-pay | Admitting: Family Medicine

## 2020-11-19 DIAGNOSIS — D72829 Elevated white blood cell count, unspecified: Secondary | ICD-10-CM

## 2020-11-19 DIAGNOSIS — R7989 Other specified abnormal findings of blood chemistry: Secondary | ICD-10-CM

## 2020-11-19 LAB — CBC WITH DIFFERENTIAL/PLATELET
Absolute Monocytes: 947 cells/uL (ref 200–950)
Basophils Absolute: 111 cells/uL (ref 0–200)
Basophils Relative: 0.9 %
Eosinophils Absolute: 344 cells/uL (ref 15–500)
Eosinophils Relative: 2.8 %
HCT: 37.2 % (ref 35.0–45.0)
Hemoglobin: 11.5 g/dL — ABNORMAL LOW (ref 11.7–15.5)
Lymphs Abs: 3567 cells/uL (ref 850–3900)
MCH: 24.7 pg — ABNORMAL LOW (ref 27.0–33.0)
MCHC: 30.9 g/dL — ABNORMAL LOW (ref 32.0–36.0)
MCV: 79.8 fL — ABNORMAL LOW (ref 80.0–100.0)
MPV: 9.8 fL (ref 7.5–12.5)
Monocytes Relative: 7.7 %
Neutro Abs: 7331 cells/uL (ref 1500–7800)
Neutrophils Relative %: 59.6 %
Platelets: 441 10*3/uL — ABNORMAL HIGH (ref 140–400)
RBC: 4.66 10*6/uL (ref 3.80–5.10)
RDW: 15.7 % — ABNORMAL HIGH (ref 11.0–15.0)
Total Lymphocyte: 29 %
WBC: 12.3 10*3/uL — ABNORMAL HIGH (ref 3.8–10.8)

## 2020-11-19 LAB — COMPREHENSIVE METABOLIC PANEL
AG Ratio: 1.3 (calc) (ref 1.0–2.5)
ALT: 17 U/L (ref 6–29)
AST: 20 U/L (ref 10–35)
Albumin: 4.4 g/dL (ref 3.6–5.1)
Alkaline phosphatase (APISO): 87 U/L (ref 37–153)
BUN: 21 mg/dL (ref 7–25)
CO2: 24 mmol/L (ref 20–32)
Calcium: 10.5 mg/dL — ABNORMAL HIGH (ref 8.6–10.4)
Chloride: 99 mmol/L (ref 98–110)
Creat: 0.68 mg/dL (ref 0.50–1.05)
Globulin: 3.4 g/dL (calc) (ref 1.9–3.7)
Glucose, Bld: 64 mg/dL — ABNORMAL LOW (ref 65–99)
Potassium: 4.3 mmol/L (ref 3.5–5.3)
Sodium: 138 mmol/L (ref 135–146)
Total Bilirubin: 0.3 mg/dL (ref 0.2–1.2)
Total Protein: 7.8 g/dL (ref 6.1–8.1)

## 2020-11-19 LAB — TSH: TSH: 2.2 mIU/L (ref 0.40–4.50)

## 2020-11-19 LAB — LIPID PANEL
Cholesterol: 131 mg/dL (ref <200)
HDL: 41 mg/dL — ABNORMAL LOW (ref >=50)
LDL Cholesterol (Calc): 64 mg/dL (calc)
Non-HDL Cholesterol (Calc): 90 mg/dL (calc) (ref <130)
Total CHOL/HDL Ratio: 3.2 (calc) (ref <5.0)
Triglycerides: 191 mg/dL — ABNORMAL HIGH (ref <150)

## 2020-11-19 LAB — HEMOGLOBIN A1C
Hgb A1c MFr Bld: 6.4 % of total Hgb — ABNORMAL HIGH (ref <5.7)
Mean Plasma Glucose: 137 mg/dL
eAG (mmol/L): 7.6 mmol/L

## 2020-11-19 MED ORDER — LINAGLIPTIN 5 MG PO TABS: 5.0000 mg | ORAL_TABLET | Freq: Every day | ORAL | 1 refills | Status: DC

## 2020-11-19 MED ORDER — METFORMIN HCL ER 750 MG PO TB24: 750.0000 mg | ORAL_TABLET | Freq: Every day | ORAL | 1 refills | Status: DC

## 2020-11-19 NOTE — Telephone Encounter (Signed)
Spoke with pt regarding labs and instructions.   

## 2020-11-19 NOTE — Telephone Encounter (Signed)
Please call patient Liver, kidney and thyroid function are normal Diabetes screening/A1c is elevated from prior, at 6.4 (was 5.8).  Therefore, I recommend leaving the doses the same for her diabetes meds.  I have called in refills on both of those. Cholesterol panel looks great and is at goal.   -Her calcium level and her total white blood cell count are both elevated.  If she is taking added calcium I would recommend she discontinue calcium supplement for now.  It is okay to continue a vitamin D supplement, if she is taking. -I also recommend she work on ensuring she is maintaining adequate hydration with at least 8 glasses of water a day.  Some of her laboratory changes are possibly reflective of mild dehydration.  -Lab appointment in 2 weeks for iron panel and repeat CBC /calcium levels after hydrating.  If blood cell counts are calcium, still elevated at that time, would need to bring her in for an appointment and discuss possible causes and further investigate.

## 2020-11-23 ENCOUNTER — Encounter: Payer: Self-pay | Admitting: Family Medicine

## 2020-11-23 ENCOUNTER — Other Ambulatory Visit: Payer: Self-pay

## 2020-11-23 ENCOUNTER — Telehealth (INDEPENDENT_AMBULATORY_CARE_PROVIDER_SITE_OTHER): Payer: Medicare Other | Admitting: Family Medicine

## 2020-11-23 VITALS — Temp 98.7°F

## 2020-11-23 DIAGNOSIS — J069 Acute upper respiratory infection, unspecified: Secondary | ICD-10-CM | POA: Diagnosis not present

## 2020-11-23 NOTE — Progress Notes (Signed)
Virtual Visit via Video Note  I connected with Carol [name] on 11/23/20 at  4:00 PM EDT by a video enabled telemedicine application and verified that I am speaking with the correct person using two identifiers.  Location patient: home, [place] Location provider:work or home office Persons participating in the virtual visit: patient, provider  I discussed the limitations of evaluation and management by telemedicine and the availability of in person appointments. The patient expressed understanding and agreed to proceed.  Telemedicine visit is a necessity given the COVID-19 restrictions in place at the current time.  HPI: 65 y/o WF with DM 2,HTN, and class II obesity who is being seen today for "fever, stuffy head, cough, sinus drainage". Onset of fever 3 d/a, Tm 101.  Home covid test NEG. Stuffy nose, PND, cough, mild ST from drainage.  Fatigued.  No SOB, no CP, no LE swelling or pain.  She relates a chronic problem with loose postprandial BMs when she eats certain foods. Some foods cause no problem.  She is unable to provide me with a timeframe for these sx's. She did voice frustration with the fact that she could not be seen in the office for her current symptoms.  Hx of covid 19 infection Jan 2021.  ROS: See pertinent positives and negatives per HPI.  Past Medical History:  Diagnosis Date   Angio-edema    Chest discomfort    , Atypical   Chicken pox    COVID-19 05/09/2019   Diabetic acidosis, type II (HCC)    Diverticulitis - large vs small bowel vs both?    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  "Normal "performed by Dr. Ty Hilts   COLONOSCOPY  04/03/2019   LAPAROTOMY N/A 11/05/2013   Procedure: EXPLORATORY LAPAROTOMY;  Surgeon: Cherylynn Ridges, MD;  Location: [facility] OR;  Service: General;  Laterality: N/A;   Lexiscan  2010   Patient reports normal Lexiscan completed around 2010 by Dr. Ty Hilts.   TOTAL ABDOMINAL HYSTERECTOMY  2004     Current Outpatient Medications:    aspirin 81 MG tablet, Take 81 mg by mouth at bedtime. , Disp: , Rfl:    candesartan-hydrochlorothiazide (ATACAND HCT) 16-12.5 MG tablet, Take 1 tablet by mouth daily., Disp: 90 tablet, Rfl: 1   cetirizine (ZYRTEC) 10 MG tablet, Take 10 mg by mouth at bedtime. , Disp: , Rfl:    citalopram (CELEXA) 20 MG tablet, Take 1 tablet (20 mg total) by mouth daily., Disp: 90 tablet, Rfl: 1   Lancets (ONETOUCH DELICA PLUS LANCET33G) MISC, check blood sugar TWICE DAILY, Disp: , Rfl:    linagliptin (TRADJENTA) 5 MG TABS tablet, Take 1 tablet (5 mg total) by mouth daily., Disp: 90 tablet, Rfl: 1   metFORMIN (GLUCOPHAGE-XR) 750 MG 24 hr tablet, Take 1 tablet (750 mg total) by mouth daily with breakfast., Disp: 90 tablet, Rfl: 1   metoprolol succinate (TOPROL-XL) 25 MG 24 hr tablet, Take 1 tablet (25 mg total) by mouth daily. Take with or immediately  following a meal., Disp: 90 tablet, Rfl: 1   omeprazole (PRILOSEC) 20 MG capsule, Take 1 capsule (20 mg total) by mouth daily., Disp: 90 capsule, Rfl: 1   ONETOUCH ULTRA test strip, test sugars TWICE DAILY as directed, Disp: 50 strip, Rfl: 6   rosuvastatin (CRESTOR) 40 MG tablet, Take 1 tablet (40 mg total) by mouth at bedtime., Disp: 90 tablet, Rfl: 3   timolol (TIMOPTIC) 0.25 % ophthalmic solution, Place 1 drop into the left eye 2 (two) times daily., Disp: , Rfl:    Travoprost, BAK Free, (TRAVATAN) 0.004 % SOLN ophthalmic solution, Place 1 drop into both eyes at bedtime., Disp: , Rfl:   EXAM:  VITALS per patient  if applicable:  Vitals with BMI 11/18/2020 09/22/2020 09/10/2020  Height 5' 2.5" - -  Weight 190 lbs 193 lbs 186 lbs  BMI 34.18 - -  Systolic 107 109 -  Diastolic 66 68 -  Pulse 60 71 -     GENERAL: alert, oriented, appears well and in no acute distress  HEENT: atraumatic, conjunttiva clear, no obvious abnormalities on inspection of external nose and ears  NECK: normal movements of the head and neck  LUNGS: on inspection no signs of respiratory distress, breathing rate appears normal, no obvious gross SOB, gasping or wheezing  CV: no obvious cyanosis  MS: moves all visible extremities without noticeable abnormality  PSYCH/NEURO: pleasant and cooperative, no obvious depression or anxiety, speech and thought processing grossly intact  LABS: none today    Chemistry      Component Value Date/Time   NA 138 11/18/2020 1333   K 4.3 11/18/2020 1333   CL 99 11/18/2020 1333   CO2 24 11/18/2020 1333   BUN 21 11/18/2020 1333   CREATININE 0.68 11/18/2020 1333      Component Value Date/Time   CALCIUM 10.5 (H) 11/18/2020 1333   ALKPHOS 85 11/05/2019 1013   AST 20 11/18/2020 1333   ALT 17 11/18/2020 1333   BILITOT 0.3 11/18/2020 1333     Lab Results  Component Value Date   HGBA1C 6.4 (H) 11/18/2020   ASSESSMENT AND PLAN:  Discussed the following assessment and plan:  URI with cough/congestion, suspect viral etiology. Discussed ongoing care with ibuprofen q6h prn, mucinex dm otc q12 hr prn. Rest, fluids. Carol [name] communicated her frustration with this visit, asked why I'm not treating her for a sinus infection and stated her dissatisfaction with not being able to be seen in person. I explained the viral nature of her illness and the fact that antibiotics are not needed/not helpful for viral infections. I also apologized for not being able to see her in person, informed her that it is an office policy designed to prevent possible contact with/spread of covid 19 virus.  Regarding her  gastrointestinal complaint.  I reassured her that I don't think this is causing her fever (this was one of her concerns). I told her it sounds like an intolerance/absorption problem specific to certain foods and not infectious or inflammatory.  I discussed the assessment and treatment plan with the patient. The patient was provided an opportunity to ask questions and all were answered. The patient agreed with the plan and demonstrated an understanding of the instructions.   F/u: if not improving in 3-4 d  Signed:  Santiago Bumpers, MD           11/23/2020

## 2020-11-25 DIAGNOSIS — R300 Dysuria: Secondary | ICD-10-CM | POA: Diagnosis not present

## 2020-11-25 DIAGNOSIS — R051 Acute cough: Secondary | ICD-10-CM | POA: Diagnosis not present

## 2020-12-04 ENCOUNTER — Ambulatory Visit (INDEPENDENT_AMBULATORY_CARE_PROVIDER_SITE_OTHER): Payer: Medicare Other

## 2020-12-04 DIAGNOSIS — R7989 Other specified abnormal findings of blood chemistry: Secondary | ICD-10-CM | POA: Diagnosis not present

## 2020-12-04 DIAGNOSIS — D72829 Elevated white blood cell count, unspecified: Secondary | ICD-10-CM

## 2020-12-07 LAB — CBC WITH DIFFERENTIAL/PLATELET
Absolute Monocytes: 561 cells/uL (ref 200–950)
Basophils Absolute: 105 cells/uL (ref 0–200)
Basophils Relative: 1.1 %
Eosinophils Absolute: 314 cells/uL (ref 15–500)
Eosinophils Relative: 3.3 %
HCT: 36 % (ref 35.0–45.0)
Hemoglobin: 11.6 g/dL — ABNORMAL LOW (ref 11.7–15.5)
Lymphs Abs: 3021 cells/uL (ref 850–3900)
MCH: 25.2 pg — ABNORMAL LOW (ref 27.0–33.0)
MCHC: 32.2 g/dL (ref 32.0–36.0)
MCV: 78.3 fL — ABNORMAL LOW (ref 80.0–100.0)
MPV: 10.2 fL (ref 7.5–12.5)
Monocytes Relative: 5.9 %
Neutro Abs: 5501 cells/uL (ref 1500–7800)
Neutrophils Relative %: 57.9 %
Platelets: 414 10*3/uL — ABNORMAL HIGH (ref 140–400)
RBC: 4.6 10*6/uL (ref 3.80–5.10)
RDW: 15.7 % — ABNORMAL HIGH (ref 11.0–15.0)
Total Lymphocyte: 31.8 %
WBC: 9.5 10*3/uL (ref 3.8–10.8)

## 2020-12-07 LAB — PTH, INTACT AND CALCIUM
Calcium: 10.5 mg/dL — ABNORMAL HIGH (ref 8.6–10.4)
PTH: 35 pg/mL (ref 16–77)

## 2020-12-07 LAB — IRON,TIBC AND FERRITIN PANEL
%SAT: 15 % (calc) — ABNORMAL LOW (ref 16–45)
Ferritin: 16 ng/mL (ref 16–288)
Iron: 60 ug/dL (ref 45–160)
TIBC: 396 mcg/dL (calc) (ref 250–450)

## 2020-12-07 LAB — VITAMIN D 25 HYDROXY (VIT D DEFICIENCY, FRACTURES): Vit D, 25-Hydroxy: 51 ng/mL (ref 30–100)

## 2020-12-08 ENCOUNTER — Telehealth: Payer: Self-pay | Admitting: Family Medicine

## 2020-12-08 DIAGNOSIS — D509 Iron deficiency anemia, unspecified: Secondary | ICD-10-CM | POA: Insufficient documentation

## 2020-12-08 DIAGNOSIS — D508 Other iron deficiency anemias: Secondary | ICD-10-CM

## 2020-12-08 MED ORDER — POLYSACCHARIDE IRON COMPLEX 150 MG PO TABS: 1.0000 | ORAL_TABLET | Freq: Every day | ORAL | 1 refills | Status: DC

## 2020-12-08 NOTE — Telephone Encounter (Signed)
Please inform patient the following information: Vitamin D levels look excellent at 51. Iron levels in the blood is in normal range, her iron stores and saturations are  low.  Since she has been consistently anemic with low iron, high have called in iron polysaccharide 150 mg tablet for her to start at least 3 times a week.  Caution on any potential constipation that can occur with iron, she may need a stool softener.  Please advise her label will say once a day but I would like her to start out every other day initially.  Her calcium levels are just mildly above goal at 10.5.  Continue to hydrate and avoid using a calcium supplement. Since her vitamin D and parathyroid hormone levels are normal, we will continue to monitor calcium closely to ensure it does not increase further.   Follow-up on both of the above conditions in 3 months and we will recheck levels at that time.

## 2020-12-08 NOTE — Telephone Encounter (Signed)
Spoke with pt regarding labs and instructions.   

## 2020-12-23 ENCOUNTER — Encounter: Payer: Self-pay | Admitting: Family Medicine

## 2020-12-23 ENCOUNTER — Other Ambulatory Visit: Payer: Self-pay | Admitting: Family Medicine

## 2020-12-23 NOTE — Telephone Encounter (Signed)
Spoke with patient regarding results/recommendations,voiced understanding.  

## 2021-01-06 ENCOUNTER — Other Ambulatory Visit: Payer: Self-pay

## 2021-01-06 ENCOUNTER — Encounter: Payer: Self-pay | Admitting: Family Medicine

## 2021-01-06 ENCOUNTER — Ambulatory Visit (INDEPENDENT_AMBULATORY_CARE_PROVIDER_SITE_OTHER): Payer: Medicare Other | Admitting: Family Medicine

## 2021-01-06 VITALS — BP 98/60 | HR 57 | Temp 98.1°F | Wt 191.0 lb

## 2021-01-06 DIAGNOSIS — N3001 Acute cystitis with hematuria: Secondary | ICD-10-CM

## 2021-01-06 DIAGNOSIS — R300 Dysuria: Secondary | ICD-10-CM | POA: Diagnosis not present

## 2021-01-06 DIAGNOSIS — Z23 Encounter for immunization: Secondary | ICD-10-CM

## 2021-01-06 LAB — POCT URINALYSIS DIPSTICK
Bilirubin, UA: NEGATIVE
Glucose, UA: NEGATIVE
Ketones, UA: NEGATIVE
Nitrite, UA: NEGATIVE
Protein, UA: POSITIVE — AB
Spec Grav, UA: 1.025 (ref 1.010–1.025)
Urobilinogen, UA: 0.2 E.U./dL
pH, UA: 5.5 (ref 5.0–8.0)

## 2021-01-06 MED ORDER — NITROFURANTOIN MONOHYD MACRO 100 MG PO CAPS: 100.0000 mg | ORAL_CAPSULE | Freq: Two times a day (BID) | ORAL | 0 refills | Status: DC

## 2021-01-06 NOTE — Progress Notes (Signed)
This visit occurred during the SARS-CoV-2 public health emergency.  Safety protocols were in place, including screening questions prior to the visit, additional usage of staff PPE, and extensive cleaning of exam room while observing appropriate contact time as indicated for disinfecting solutions.   Carol [name] , 1955-04-28, 65 y.o., female MRN: [id number] Patient Care Team    Relationship Specialty Notifications Start End  Natalia Leatherwood, DO PCP - General Family Medicine  06/10/19   Jake Bathe, MD Consulting Physician Cardiology  06/12/19   Iva Boop, MD Consulting Physician Gastroenterology  06/12/19   Elwin Mocha, MD Consulting Physician Ophthalmology  06/12/19   Frankey Poot, MD Referring Physician Obstetrics and Gynecology  06/12/19   Jessica Priest, MD Consulting Physician Allergy and Immunology  06/12/19     Chief Complaint  Patient presents with   Dysuria    Pt c/o dysuria x 2 days;      Subjective: Pt presents for an OV with complaints of dysuria x2 days. She had a uti 3 mos ago,tx at urgent care. She had a normal urinalysis after treatment. Treated with macrobid. She denies fever or chills. She is tolerating po.   09/22/2020: female present for UTI recheck.  She reports she was seen by telehealth visit approximately 2 weeks ago and diagnosed with a UTI.  She was treated with Macrobid twice daily.  She admits she missed read the label and has only been taking Macrobid 1 times a day.  She does state her symptoms are resolved.  She denies any fever, chills, nausea or vomit.  She is no longer having any dysuria or hematuria.  She does note some mild lower back pain which has her concerned for any type of kidney dysfunction. Depression screen [facility] 2/9 11/18/2020 06/29/2020 05/25/2020 11/05/2019 06/10/2019  Decreased Interest 0 1 0 0 0  Down, Depressed, Hopeless 0 0 0 0 0  PHQ - 2 Score 0 1 0 0 0  Altered sleeping - 3 - - -  Tired, decreased energy - 1 - - -  Change in  appetite - 0 - - -  Feeling bad or failure about yourself  - 0 - - -  Trouble concentrating - 0 - - -  Moving slowly or fidgety/restless - 0 - - -  Suicidal thoughts - 0 - - -  PHQ-9 Score - 5 - - -    Allergies  Allergen Reactions   Ivp Dye [Iodinated Diagnostic Agents] Hives and Shortness Of Breath    Did ok in OP setting with 13 hr Premedications    Latanoprost    Lisinopril Cough   Onion Rash    Rash and GI upset   Sulfa Antibiotics Hives   Social History   Social History Narrative   Marital status/children/pets: Married.   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   Safety:      -smoke alarm in the home:Yes     - wears seatbelt: Yes     - Feels safe in their relationships: Yes   Past Medical History:  Diagnosis Date   Angio-edema    Chest discomfort    , Atypical   Chicken pox    COVID-19 05/09/2019   Diabetic acidosis, type II (HCC)    Diverticulitis - large vs small bowel vs both?    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  "Normal "performed by Dr. Ty Hilts   COLONOSCOPY  04/03/2019   LAPAROTOMY N/A 11/05/2013   Procedure: EXPLORATORY LAPAROTOMY;  Surgeon: Cherylynn Ridges, MD;  Location: [facility] OR;  Service: General;  Laterality: N/A;   Lexiscan  2010   Patient reports normal Lexiscan completed around 2010 by Dr. Ty Hilts.   TOTAL ABDOMINAL HYSTERECTOMY  2004   Family History  Problem Relation Age of Onset   Heart disease Mother    Heart attack Mother    Asthma  Mother    Early death Mother    Miscarriages / Stillbirths Mother    Heart disease Father    Lung cancer Father    Heart attack Brother    Asthma Brother    Heart disease Brother    Kidney disease Brother    Diverticulitis Sister    Heart disease Brother    Heart attack Brother    Heart disease Brother    Heart attack Brother    Early death Maternal Grandmother    Tuberculosis Maternal Grandmother    Depression Maternal Grandfather    Heart disease Paternal Grandmother    Heart disease Sister    Colon cancer Neg Hx    Esophageal cancer Neg Hx    Rectal cancer Neg Hx    Stomach cancer Neg Hx    Allergies as of 01/06/2021       Reactions   Ivp Dye [iodinated Diagnostic Agents] Hives, Shortness Of Breath   Did ok in OP setting with 13 hr Premedications    Latanoprost    Lisinopril Cough   Onion Rash   Rash and GI upset   Sulfa Antibiotics Hives        Medication List        Accurate as of January 06, 2021  3:53 PM. If you have any questions, ask your nurse or doctor.          aspirin 81 MG tablet Take 81 mg by mouth at bedtime.   candesartan-hydrochlorothiazide 16-12.5 MG tablet Commonly known as: ATACAND HCT Take 1 tablet by mouth daily.   cetirizine 10 MG tablet Commonly known as: ZYRTEC Take 10 mg by mouth at bedtime.   citalopram 20 MG tablet Commonly known as: CELEXA Take 1 tablet (20 mg total) by mouth daily.   linagliptin 5 MG Tabs tablet Commonly known as: TRADJENTA Take 1 tablet (5 mg total) by mouth daily.   metFORMIN 750 MG 24 hr tablet Commonly known as: GLUCOPHAGE-XR Take 1 tablet (750 mg total) by mouth daily with breakfast.   metoprolol succinate 25 MG 24 hr tablet Commonly known as: TOPROL-XL Take 1 tablet (25 mg total) by mouth daily. Take with or immediately following a meal.   omeprazole 20 MG capsule Commonly known as: PRILOSEC Take 1 capsule (20 mg total) by mouth daily.   OneTouch Delica Plus Lancet33G Misc check  blood sugar TWICE DAILY   OneTouch Ultra test strip Generic drug: glucose blood test sugars TWICE DAILY as directed   Polysaccharide Iron Complex 150 MG Tabs Take 1 tablet by mouth daily.  rosuvastatin 40 MG tablet Commonly known as: CRESTOR Take 1 tablet (40 mg total) by mouth at bedtime.   timolol 0.25 % ophthalmic solution Commonly known as: TIMOPTIC Place 1 drop into the left eye 2 (two) times daily.   Travoprost (BAK Free) 0.004 % Soln ophthalmic solution Commonly known as: TRAVATAN Place 1 drop into both eyes at bedtime.        All past medical history, surgical history, allergies, family history, immunizations andmedications were updated in the EMR today and reviewed under the history and medication portions of their EMR.     ROS: Negative, with the exception of above mentioned in HPI   Objective:  BP 98/60   Pulse (!) 57   Temp 98.1 F (36.7 C) (Oral)   Wt 191 lb (86.6 kg)   SpO2 99%   BMI 34.38 kg/m  Body mass index is 34.38 kg/m. Gen: Afebrile. No acute distress. Nontoxic in appearance, well developed, well nourished. Very pleasant female.  HENT: AT. [place]. MMM Eyes:Pupils Equal Round Reactive to light, Extraocular movements intact,  Conjunctiva without redness, discharge or icterus. Abd: Soft. NTND. BS present.  MSK: no cva tenderness.  Neuro: Normal gait. PERLA. EOMi. Alert. Oriented x3  Psych: Normal affect, dress and demeanor. Normal speech. Normal thought content and judgment.  No results found. No results found. Results for orders placed or performed in visit on 01/06/21 (from the past 24 hour(s))  POCT Urinalysis Dipstick     Status: Abnormal   Collection Time: 01/06/21  3:49 PM  Result Value Ref Range   Color, UA yellow    Clarity, UA cloudy    Glucose, UA Negative Negative   Bilirubin, UA negative    Ketones, UA negative    Spec Grav, UA 1.025 1.010 - 1.025   Blood, UA 3+    pH, UA 5.5 5.0 - 8.0   Protein, UA Positive (A) Negative    Urobilinogen, UA 0.2 0.2 or 1.0 E.U./dL   Nitrite, UA negative    Leukocytes, UA Large (3+) (A) Negative   Appearance     Odor      Assessment/Plan: LAZARIA SCHABEN is a 65 y.o. female present for OV for  Acute cystitis with hematuria/dysuria Point-of-care urinalysis appears infectious today.  Initiated treatment with Macrobid twice daily x7 days.  Will send for urine culture. - POCT Urinalysis Dipstick> urine culture - consider urology referral  versus long-term treatment for recurrent UTI. Patient will be called with laboratory results and plan discussed at that time.  Influenza vaccine administered today  Follow-up as needed  Reviewed expectations re: course of current medical issues. Discussed self-management of symptoms. Outlined signs and symptoms indicating need for more acute intervention. Patient verbalized understanding and all questions were answered. Patient received an After-Visit Summary.    Orders Placed This Encounter  Procedures   Flu Vaccine QUAD High Dose(Fluad)   POCT Urinalysis Dipstick   No orders of the defined types were placed in this encounter.  Referral Orders  No referral(s) requested today     Note is dictated utilizing voice recognition software. Although note has been proof read prior to signing, occasional typographical errors still can be missed. If any questions arise, please do not hesitate to call for verification.   electronically signed by:  Felix Pacini, DO  [facility] - OR

## 2021-01-06 NOTE — Patient Instructions (Signed)
  Great to see you today.    If labs were collected, we will inform you of lab results once received either by echart message or telephone call.   - echart message- for normal results that have been seen by the patient already.   - telephone call: abnormal results or if patient has not viewed results in their echart.  

## 2021-01-09 LAB — URINALYSIS W MICROSCOPIC + REFLEX CULTURE
Bilirubin Urine: NEGATIVE
Glucose, UA: NEGATIVE
Hyaline Cast: NONE SEEN /LPF
Ketones, ur: NEGATIVE
Nitrites, Initial: POSITIVE — AB
RBC / HPF: >=60 /HPF — AB (ref 0–2)
Specific Gravity, Urine: 1.016 (ref 1.001–1.035)
Squamous Epithelial / HPF: NONE SEEN /HPF (ref <=5)
WBC, UA: >=60 /HPF — AB (ref 0–5)
pH: 5.5 (ref 5.0–8.0)

## 2021-01-09 LAB — CULTURE INDICATED

## 2021-01-09 LAB — URINE CULTURE
MICRO NUMBER:: 12438797
SPECIMEN QUALITY:: ADEQUATE

## 2021-01-11 ENCOUNTER — Telehealth: Payer: Self-pay

## 2021-01-11 NOTE — Telephone Encounter (Signed)
Spoke with pt regarding labs and instructions.   

## 2021-01-11 NOTE — Telephone Encounter (Signed)
Returning Carol [name]'s call. Please call patient when available.

## 2021-01-18 ENCOUNTER — Telehealth: Payer: Self-pay | Admitting: Family Medicine

## 2021-01-18 NOTE — Telephone Encounter (Signed)
Left message for patient to schedule Annual Wellness Visit.  Please schedule with Nurse Health Advisor at [facility] [place]. Please call [phone] ask for Kathy 

## 2021-01-27 DIAGNOSIS — H401131 Primary open-angle glaucoma, bilateral, mild stage: Secondary | ICD-10-CM | POA: Diagnosis not present

## 2021-01-27 LAB — HM DIABETES EYE EXAM

## 2021-01-29 ENCOUNTER — Other Ambulatory Visit: Payer: Self-pay | Admitting: Family Medicine

## 2021-01-30 DIAGNOSIS — E7800 Pure hypercholesterolemia, unspecified: Secondary | ICD-10-CM | POA: Diagnosis not present

## 2021-01-30 DIAGNOSIS — Z79899 Other long term (current) drug therapy: Secondary | ICD-10-CM | POA: Diagnosis not present

## 2021-01-30 DIAGNOSIS — I10 Essential (primary) hypertension: Secondary | ICD-10-CM | POA: Diagnosis not present

## 2021-01-30 DIAGNOSIS — Z882 Allergy status to sulfonamides status: Secondary | ICD-10-CM | POA: Diagnosis not present

## 2021-01-30 DIAGNOSIS — R7303 Prediabetes: Secondary | ICD-10-CM | POA: Diagnosis not present

## 2021-01-30 DIAGNOSIS — Z888 Allergy status to other drugs, medicaments and biological substances status: Secondary | ICD-10-CM | POA: Diagnosis not present

## 2021-01-30 DIAGNOSIS — S0181XA Laceration without foreign body of other part of head, initial encounter: Secondary | ICD-10-CM | POA: Diagnosis not present

## 2021-01-30 DIAGNOSIS — Z7982 Long term (current) use of aspirin: Secondary | ICD-10-CM | POA: Diagnosis not present

## 2021-01-30 DIAGNOSIS — Z91041 Radiographic dye allergy status: Secondary | ICD-10-CM | POA: Diagnosis not present

## 2021-01-30 DIAGNOSIS — Z7984 Long term (current) use of oral hypoglycemic drugs: Secondary | ICD-10-CM | POA: Diagnosis not present

## 2021-02-01 ENCOUNTER — Telehealth: Payer: Self-pay

## 2021-02-01 NOTE — Telephone Encounter (Signed)
Spoke with pt who stated that stitches was done Sat night. Please advise on possible work in appt

## 2021-02-01 NOTE — Telephone Encounter (Signed)
Pt called saying she recently fell and hit her head. She has stiches that are due to come out on Thursday. She is having fluid build up under her lower eye lid. She says it is warm to touch but she does not have fever of any kind. Advised pt to call in the AM to see if we can work her in for this and get scheduled for stitch removal.

## 2021-02-02 ENCOUNTER — Encounter: Payer: Self-pay | Admitting: Family Medicine

## 2021-02-02 ENCOUNTER — Other Ambulatory Visit: Payer: Self-pay

## 2021-02-02 ENCOUNTER — Ambulatory Visit (INDEPENDENT_AMBULATORY_CARE_PROVIDER_SITE_OTHER): Payer: Medicare Other | Admitting: Family Medicine

## 2021-02-02 VITALS — BP 118/69 | HR 55 | Temp 98.1°F | Wt 192.0 lb

## 2021-02-02 DIAGNOSIS — S0181XA Laceration without foreign body of other part of head, initial encounter: Secondary | ICD-10-CM | POA: Diagnosis not present

## 2021-02-02 DIAGNOSIS — W19XXXA Unspecified fall, initial encounter: Secondary | ICD-10-CM | POA: Diagnosis not present

## 2021-02-02 NOTE — Patient Instructions (Signed)
  Apply bag balm at least twice daily.  See you in a few days.

## 2021-02-02 NOTE — Telephone Encounter (Signed)
Is scheduled today for eye swelling. Please schedule her for Friday in the 1130 slot for stitch removal. Thanks

## 2021-02-02 NOTE — Telephone Encounter (Signed)
Note reviewed pt ED notes. Pt went in to 01/30/21 and was instructed to remove stitches on day 5.

## 2021-02-02 NOTE — Telephone Encounter (Signed)
Please assist with scheduling pt

## 2021-02-02 NOTE — Progress Notes (Signed)
This visit occurred during the SARS-CoV-2 public health emergency.  Safety protocols were in place, including screening questions prior to the visit, additional usage of staff PPE, and extensive cleaning of exam room while observing appropriate contact time as indicated for disinfecting solutions.    Carol [name] , 14-Jan-1956, 65 y.o., female MRN: [id number] Patient Care Team    Relationship Specialty Notifications Start End  Natalia Leatherwood, DO PCP - General Family Medicine  06/10/19   Jake Bathe, MD Consulting Physician Cardiology  06/12/19   Iva Boop, MD Consulting Physician Gastroenterology  06/12/19   Elwin Mocha, MD Consulting Physician Ophthalmology  06/12/19   Frankey Poot, MD Referring Physician Obstetrics and Gynecology  06/12/19   Jessica Priest, MD Consulting Physician Allergy and Immunology  06/12/19     Chief Complaint  Patient presents with   Eye Injury    Pt reports eye swelling, and redness after stitches being placed 10/22;      Subjective: Pt presents for an OV to follow up on ED visit for facial laceration. She reports she bent over to tie her shoes and lost her balance. She lacerated her face and required sutures. She is here today due to concern over eye swelling since she was seen in ED. She reports she has been placing ice on the area. She denies drainage or bleeding.  Reviewed ED note and CT results.   Depression screen [facility] 2/9 02/02/2021 11/18/2020 06/29/2020 05/25/2020 11/05/2019  Decreased Interest 0 0 1 0 0  Down, Depressed, Hopeless - 0 0 0 0  PHQ - 2 Score 0 0 1 0 0  Altered sleeping - - 3 - -  Tired, decreased energy - - 1 - -  Change in appetite - - 0 - -  Feeling bad or failure about yourself  - - 0 - -  Trouble concentrating - - 0 - -  Moving slowly or fidgety/restless - - 0 - -  Suicidal thoughts - - 0 - -  PHQ-9 Score - - 5 - -    Allergies  Allergen Reactions   Ivp Dye [Iodinated Diagnostic Agents] Hives and Shortness Of  Breath    Did ok in OP setting with 13 hr Premedications    Latanoprost    Lisinopril Cough   Onion Rash    Rash and GI upset   Sulfa Antibiotics Hives   Social History   Social History Narrative   Marital status/children/pets: Married.   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   Safety:      -smoke alarm in the home:Yes     - wears seatbelt: Yes     - Feels safe in their relationships: Yes   Past Medical History:  Diagnosis Date   Angio-edema    Chest discomfort    , Atypical   Chicken pox    COVID-19 05/09/2019   Diabetic acidosis, type II (HCC)    Diverticulitis - large vs small bowel vs both?    Gastroesophageal reflux disease    Glaucoma    Hyperlipidemia    Hypertension    PVC (premature ventricular contraction)    Systolic dysfunction    , Hyperdynamic   Past Surgical History:  Procedure Laterality Date   APPENDECTOMY N/A 11/05/2013   Procedure: APPENDECTOMY;  Surgeon: Cherylynn Ridges, MD;  Location: [facility] OR;  Service: General;  Laterality: N/A;   BOWEL RESECTION N/A 11/05/2013   interloop abscess - NO  resection   CARDIAC CATHETERIZATION  2010   Patient reports cardiac cath approximately 2010.  "Normal "performed by Dr. Ty Hilts   COLONOSCOPY  04/03/2019   LAPAROTOMY N/A 11/05/2013   Procedure: EXPLORATORY LAPAROTOMY;  Surgeon: Cherylynn Ridges, MD;  Location: [facility] OR;  Service: General;  Laterality: N/A;   Lexiscan  2010   Patient reports normal Lexiscan completed around 2010 by Dr. Ty Hilts.   TOTAL ABDOMINAL HYSTERECTOMY  2004   Family History  Problem Relation Age of Onset   Heart disease Mother    Heart attack Mother    Asthma Mother    Early death Mother    Miscarriages / Stillbirths Mother    Heart disease Father    Lung cancer Father    Heart attack Brother    Asthma Brother    Heart disease Brother    Kidney disease Brother    Diverticulitis Sister    Heart disease Brother    Heart attack Brother    Heart disease  Brother    Heart attack Brother    Early death Maternal Grandmother    Tuberculosis Maternal Grandmother    Depression Maternal Grandfather    Heart disease Paternal Grandmother    Heart disease Sister    Colon cancer Neg Hx    Esophageal cancer Neg Hx    Rectal cancer Neg Hx    Stomach cancer Neg Hx    Allergies as of 02/02/2021       Reactions   Ivp Dye [iodinated Diagnostic Agents] Hives, Shortness Of Breath   Did ok in OP setting with 13 hr Premedications    Latanoprost    Lisinopril Cough   Onion Rash   Rash and GI upset   Sulfa Antibiotics Hives        Medication List        Accurate as of February 02, 2021  1:54 PM. If you have any questions, ask your nurse or doctor.          STOP taking these medications    nitrofurantoin (macrocrystal-monohydrate) 100 MG capsule Commonly known as: Macrobid Stopped by: Felix Pacini, DO       TAKE these medications    aspirin 81 MG tablet Take 81 mg by mouth at bedtime.   candesartan-hydrochlorothiazide 16-12.5 MG tablet Commonly known as: ATACAND HCT Take 1 tablet by mouth daily.   cetirizine 10 MG tablet Commonly known as: ZYRTEC Take 10 mg by mouth at bedtime.   citalopram 20 MG tablet Commonly known as: CELEXA Take 1 tablet (20 mg total) by mouth daily.   Ferrex 150 150 MG capsule Generic drug: iron polysaccharides Take by mouth daily. What changed: Another medication with the same name was removed. Continue taking this medication, and follow the directions you see here. Changed by: Felix Pacini, DO   linagliptin 5 MG Tabs tablet Commonly known as: TRADJENTA Take 1 tablet (5 mg total) by mouth daily.   metFORMIN 750 MG 24 hr tablet Commonly known as: GLUCOPHAGE-XR Take 1 tablet (750 mg total) by mouth daily with breakfast.   metoprolol succinate 25 MG 24 hr tablet Commonly known as: TOPROL-XL Take 1 tablet (25 mg total) by mouth daily. Take with or immediately following a meal.   omeprazole 20  MG capsule Commonly known as: PRILOSEC Take 1 capsule (20 mg total) by mouth daily.   OneTouch Delica Plus Lancet33G Misc check blood sugar TWICE DAILY   OneTouch Ultra test strip Generic drug: glucose blood test sugars TWICE DAILY  as directed   rosuvastatin 40 MG tablet Commonly known as: CRESTOR Take 1 tablet (40 mg total) by mouth at bedtime.   timolol 0.25 % ophthalmic solution Commonly known as: TIMOPTIC Place 1 drop into the left eye 2 (two) times daily.   Travoprost (BAK Free) 0.004 % Soln ophthalmic solution Commonly known as: TRAVATAN Place 1 drop into both eyes at bedtime.        All past medical history, surgical history, allergies, family history, immunizations andmedications were updated in the EMR today and reviewed under the history and medication portions of their EMR.     ROS: Negative, with the exception of above mentioned in HPI   Objective:  BP 118/69   Pulse (!) 55   Temp 98.1 F (36.7 C) (Oral)   Wt 192 lb (87.1 kg)   SpO2 95%   BMI 34.56 kg/m  Body mass index is 34.56 kg/m. Gen: Afebrile. No acute distress. Nontoxic in appearance, well developed, well nourished.  HENT: small laceration right forehead with 10 prolene sutures. [place].  Eyes:Pupils Equal Round Reactive to light, Extraocular movements intact,  Conjunctiva without redness, discharge or icterus. Skin: no rashes, purpura or petechiae. Well healing laceration and facial abrasion of right side of face.  Neuro: Normal gait. PERLA. EOMi. Alert. Oriented x3  Psych: Normal affect, dress and demeanor. Normal speech. Normal thought content and judgment.  No results found. No results found. No results found for this or any previous visit (from the past 24 hour(s)).  Assessment/Plan: Carol [name] is a 66 y.o. female present for OV for  Facial laceration, initial encounter/Fall, initial encounter Well healing facial laceration . Sutures intact and well approximated.  As expected bruising  and swelling surrounding eye.  Reassured.  Discussed wound cleansing and BB application.  F/u is scheduled in 2 days for suture removal.   Reviewed expectations re: course of current medical issues. Discussed self-management of symptoms. Outlined signs and symptoms indicating need for more acute intervention. Patient verbalized understanding and all questions were answered. Patient received an After-Visit Summary.    No orders of the defined types were placed in this encounter.  No orders of the defined types were placed in this encounter.  Referral Orders  No referral(s) requested today     Note is dictated utilizing voice recognition software. Although note has been proof read prior to signing, occasional typographical errors still can be missed. If any questions arise, please do not hesitate to call for verification.   electronically signed by:  Felix Pacini, DO  [facility] - [place]

## 2021-02-05 ENCOUNTER — Ambulatory Visit (INDEPENDENT_AMBULATORY_CARE_PROVIDER_SITE_OTHER): Payer: Medicare Other | Admitting: Family Medicine

## 2021-02-05 ENCOUNTER — Other Ambulatory Visit: Payer: Self-pay

## 2021-02-05 ENCOUNTER — Encounter: Payer: Self-pay | Admitting: Family Medicine

## 2021-02-05 VITALS — BP 111/72 | HR 60 | Temp 98.4°F | Ht 63.0 in | Wt 191.0 lb

## 2021-02-05 DIAGNOSIS — W19XXXD Unspecified fall, subsequent encounter: Secondary | ICD-10-CM

## 2021-02-05 DIAGNOSIS — Z5189 Encounter for other specified aftercare: Secondary | ICD-10-CM | POA: Diagnosis not present

## 2021-02-05 NOTE — Progress Notes (Addendum)
This visit occurred during the SARS-CoV-2 public health emergency.  Safety protocols were in place, including screening questions prior to the visit, additional usage of staff PPE, and extensive cleaning of exam room while observing appropriate contact time as indicated for disinfecting solutions.    Carol [name] , 10/19/1955, 65 y.o., female MRN: [id number] Patient Care Team    Relationship Specialty Notifications Start End  Natalia Leatherwood, DO PCP - General Family Medicine  06/10/19   Jake Bathe, MD Consulting Physician Cardiology  06/12/19   Iva Boop, MD Consulting Physician Gastroenterology  06/12/19   Elwin Mocha, MD Consulting Physician Ophthalmology  06/12/19   Frankey Poot, MD Referring Physician Obstetrics and Gynecology  06/12/19   Jessica Priest, MD Consulting Physician Allergy and Immunology  06/12/19     Chief Complaint  Patient presents with   Suture / Staple Removal     Subjective:Carol [name] is a 65 y.o. female present for wound check after fall and suture placement at the ED. The swelling around her eye is improving. She has been placing ointment over her wounds. She denies fever, chills, wound drainage or bleeding.    Prior note:  Pt presents for an OV to follow up on ED visit for facial laceration. She reports she bent over to tie her shoes and lost her balance. She lacerated her face and required sutures. She is here today due to concern over eye swelling since she was seen in ED. She reports she has been placing ice on the area. She denies drainage or bleeding.  Reviewed ED note and CT results.   Depression screen [facility] 2/9 02/02/2021 11/18/2020 06/29/2020 05/25/2020 11/05/2019  Decreased Interest 0 0 1 0 0  Down, Depressed, Hopeless - 0 0 0 0  PHQ - 2 Score 0 0 1 0 0  Altered sleeping - - 3 - -  Tired, decreased energy - - 1 - -  Change in appetite - - 0 - -  Feeling bad or failure about yourself  - - 0 - -  Trouble concentrating - - 0 - -   Moving slowly or fidgety/restless - - 0 - -  Suicidal thoughts - - 0 - -  PHQ-9 Score - - 5 - -    Allergies  Allergen Reactions   Ivp Dye [Iodinated Diagnostic Agents] Hives and Shortness Of Breath    Did ok in OP setting with 13 hr Premedications    Latanoprost    Lisinopril Cough   Onion Rash    Rash and GI upset   Sulfa Antibiotics Hives   Social History   Social History Narrative   Marital status/children/pets: Married.   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   Safety:      -smoke alarm in the home:Yes     - wears seatbelt: Yes     - Feels safe in their relationships: Yes   Past Medical History:  Diagnosis Date   Angio-edema    Chest discomfort    , Atypical   Chicken pox    COVID-19 05/09/2019   Diabetic acidosis, type II (HCC)    Diverticulitis - large vs small bowel vs both?    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  "Normal "performed by Dr. Ty Hilts   COLONOSCOPY  04/03/2019   LAPAROTOMY N/A 11/05/2013   Procedure: EXPLORATORY LAPAROTOMY;  Surgeon: Cherylynn Ridges, MD;  Location: [facility] OR;  Service: General;  Laterality: N/A;   Lexiscan  2010   Patient reports normal Lexiscan completed around 2010 by Dr. Ty Hilts.   TOTAL ABDOMINAL HYSTERECTOMY  2004   Family History  Problem Relation Age of Onset   Heart disease Mother    Heart attack Mother    Asthma Mother    Early death Mother    Miscarriages / Stillbirths Mother    Heart disease Father    Lung cancer  Father    Heart attack Brother    Asthma Brother    Heart disease Brother    Kidney disease Brother    Diverticulitis Sister    Heart disease Brother    Heart attack Brother    Heart disease Brother    Heart attack Brother    Early death Maternal Grandmother    Tuberculosis Maternal Grandmother    Depression Maternal Grandfather    Heart disease Paternal Grandmother    Heart disease Sister    Colon cancer Neg Hx    Esophageal cancer Neg Hx    Rectal cancer Neg Hx    Stomach cancer Neg Hx    Allergies as of 02/05/2021       Reactions   Ivp Dye [iodinated Diagnostic Agents] Hives, Shortness Of Breath   Did ok in OP setting with 13 hr Premedications    Latanoprost    Lisinopril Cough   Onion Rash   Rash and GI upset   Sulfa Antibiotics Hives        Medication List        Accurate as of February 05, 2021 10:49 AM. If you have any questions, ask your nurse or doctor.          aspirin 81 MG tablet Take 81 mg by mouth at bedtime.   candesartan-hydrochlorothiazide 16-12.5 MG tablet Commonly known as: ATACAND HCT Take 1 tablet by mouth daily.   cetirizine 10 MG tablet Commonly known as: ZYRTEC Take 10 mg by mouth at bedtime.   citalopram 20 MG tablet Commonly known as: CELEXA Take 1 tablet (20 mg total) by mouth daily.   Ferrex 150 150 MG capsule Generic drug: iron polysaccharides Take by mouth daily.   linagliptin 5 MG Tabs tablet Commonly known as: TRADJENTA Take 1 tablet (5 mg total) by mouth daily.   metFORMIN 750 MG 24 hr tablet Commonly known as: GLUCOPHAGE-XR Take 1 tablet (750 mg total) by mouth daily with breakfast.   metoprolol succinate 25 MG 24 hr tablet Commonly known as: TOPROL-XL Take 1 tablet (25 mg total) by mouth daily. Take with or immediately following a meal.   omeprazole 20 MG capsule Commonly known as: PRILOSEC Take 1 capsule (20 mg total) by mouth daily.   OneTouch Delica Plus Lancet33G Misc check blood sugar TWICE DAILY    OneTouch Ultra test strip Generic drug: glucose blood test sugars TWICE DAILY as directed   rosuvastatin 40 MG tablet Commonly known as: CRESTOR Take 1 tablet (40 mg total) by mouth at bedtime.   timolol  0.25 % ophthalmic solution Commonly known as: TIMOPTIC Place 1 drop into the left eye 2 (two) times daily.   Travoprost (BAK Free) 0.004 % Soln ophthalmic solution Commonly known as: TRAVATAN Place 1 drop into both eyes at bedtime.        All past medical history, surgical history, allergies, family history, immunizations andmedications were updated in the EMR today and reviewed under the history and medication portions of their EMR.     ROS: Negative, with the exception of above mentioned in HPI   Objective:  BP 111/72   Pulse 60   Temp 98.4 F (36.9 C) (Oral)   Ht 5\' 3"  (1.6 m)   Wt 191 lb (86.6 kg)   SpO2 96%   BMI 33.83 kg/m  Body mass index is 33.83 kg/m. Gen: Afebrile. No acute distress.  HENT: [place]. X5 prolene sutures right forehead. No drainage or erythema.  Eyes:Pupils Equal Round Reactive to light, Extraocular movements intact,  Conjunctiva without redness, discharge or icterus. Neuro: Normal gait. PERLA. EOMi. Alert. Oriented x3 Psych: Normal affect, dress and demeanor. Normal speech. Normal thought content and judgment.    No results found. No results found. No results found for this or any previous visit (from the past 24 hour(s)).  Assessment/Plan: Carol [name] is a 66 y.o. female present for OV for  Visit for wound check/Fall, subsequent encounter No erythema, no drainage.  Removal of x5 prolene sutures. Pt tolerated suture removal well.  Wound is healing well. Good approximation.   Reviewed expectations re: course of current medical issues. Discussed self-management of symptoms. Outlined signs and symptoms indicating need for more acute intervention. Patient verbalized understanding and all questions were answered. Patient received an  After-Visit Summary.    No orders of the defined types were placed in this encounter.  No orders of the defined types were placed in this encounter.  Referral Orders  No referral(s) requested today     Note is dictated utilizing voice recognition software. Although note has been proof read prior to signing, occasional typographical errors still can be missed. If any questions arise, please do not hesitate to call for verification.   electronically signed by:  75, DO  [facility] - OR

## 2021-02-05 NOTE — Patient Instructions (Signed)
  Keep area clean and dry.  Apply ointment twice a day.

## 2021-02-15 ENCOUNTER — Encounter: Payer: Self-pay | Admitting: Family Medicine

## 2021-02-15 ENCOUNTER — Telehealth (INDEPENDENT_AMBULATORY_CARE_PROVIDER_SITE_OTHER): Payer: Medicare Other | Admitting: Family Medicine

## 2021-02-15 ENCOUNTER — Other Ambulatory Visit: Payer: Self-pay

## 2021-02-15 DIAGNOSIS — K5792 Diverticulitis of intestine, part unspecified, without perforation or abscess without bleeding: Secondary | ICD-10-CM | POA: Diagnosis not present

## 2021-02-15 DIAGNOSIS — R197 Diarrhea, unspecified: Secondary | ICD-10-CM | POA: Diagnosis not present

## 2021-02-15 MED ORDER — ONDANSETRON HCL 4 MG PO TABS: 4.0000 mg | ORAL_TABLET | Freq: Three times a day (TID) | ORAL | 0 refills | Status: DC | PRN

## 2021-02-15 MED ORDER — METRONIDAZOLE 500 MG PO TABS: 500.0000 mg | ORAL_TABLET | Freq: Three times a day (TID) | ORAL | 0 refills | Status: AC

## 2021-02-15 MED ORDER — CIPROFLOXACIN HCL 500 MG PO TABS: 500.0000 mg | ORAL_TABLET | Freq: Two times a day (BID) | ORAL | 0 refills | Status: AC

## 2021-02-15 NOTE — Patient Instructions (Signed)
Colitis Colitis is a condition in which the colon is inflamed. It can cause diarrhea, blood in the stool, and abdominal pain. Colitis can last a short time (be acute), or it may last a long time (become chronic). What are the causes? This condition may be caused by: Infections from viruses or bacteria. A reaction to medicine. Certain autoimmune diseases, such as Crohn's disease or ulcerative colitis. Radiation treatment. Decreased blood flow to the bowel (ischemia). What are the signs or symptoms? Symptoms of this condition include: Diarrhea, blood in the stool, or black, tarry stool. Pain in the joints or abdominal pain. Fever or fatigue. Vomiting. Weight loss. Bloating. Having fewer bowel movements than usual. A strong and sudden urge to have a bowel movement. Feeling like the bowel is not empty after a bowel movement. How is this diagnosed? This condition may be diagnosed based on a stool test and a blood test. You may also have other tests, such as: X-rays. CT scan. Colonoscopy. Endoscopy. Biopsy. How is this treated? Treatment for this condition depends on the cause. This condition may be treated with: Steps to rest the bowel, such as not eating or drinking for a period of time. Fluids that are given through an IV. Medicine for pain and diarrhea. Antibiotic medicines. Cortisone medicines. Surgery. Follow these instructions at home: Eating and drinking  Follow instructions from your health care provider about eating or drinking restrictions. Drink enough fluid to keep your urine pale yellow. Work with a dietitian to determine whether certain foods cause your condition to flare up. Avoid foods or drinks that cause flare-ups. Eat a well-balanced diet. General instructions If you were prescribed an antibiotic medicine, take it as told by your health care provider. Do not stop taking the antibiotic even if you start to feel better. Take over-the-counter and prescription  medicines only as told by your health care provider. Keep all follow-up visits. This is important. Contact a health care provider if: Your symptoms do not go away. You develop new symptoms. Get help right away if: You have a fever that does not go away with treatment. You develop chills. You have extreme weakness, fainting, or dehydration. You vomit repeatedly. You develop severe pain in your abdomen. You pass bloody or tarry stool. Summary Colitis is a condition in which the colon is inflamed. Colitis can last a short time (be acute), or it may last a long time (become chronic). Treatment for this condition depends on the cause and may include resting the bowel, taking medicines, or having surgery. If you were prescribed an antibiotic medicine, take it as told by your health care provider. Do not stop taking the antibiotic even if you start to feel better. Get help right away if you develop severe pain in your abdomen. Keep all follow-up visits. This is important. This information is not intended to replace advice given to you by your health care provider. Make sure you discuss any questions you have with your health care provider. Document Revised: 12/03/2019 Document Reviewed: 12/03/2019 Elsevier Patient Education  2022 Elsevier Inc.    Diverticulitis Diverticulitis is when small pouches in your colon (large intestine) get infected or swollen. This causes pain in the belly (abdomen) and watery poop (diarrhea). These pouches are called diverticula. The pouches form in people who have a condition called diverticulosis. What are the causes? This condition may be caused by poop (stool) that gets trapped in the pouches in your colon. The poop lets germs (bacteria) grow in the pouches. This  causes the infection. What increases the risk? You are more likely to get this condition if you have small pouches in your colon. The risk is higher if: You are overweight or very overweight  (obese). You do not exercise enough. You drink alcohol. You smoke or use products with tobacco in them. You eat a diet that has a lot of red meat such as beef, pork, or lamb. You eat a diet that does not have enough fiber in it. You are older than 66 years of age. What are the signs or symptoms? Pain in the belly. Pain is often on the left side, but it may be in other areas. Fever and feeling cold. Feeling like you may vomit. Vomiting. Having cramps. Feeling full. Changes to how often you poop. Blood in your poop. How is this treated? Most cases are treated at home by: Taking over-the-counter pain medicines. Following a clear liquid diet. Taking antibiotic medicines. Resting. Very bad cases may need to be treated at a hospital. This may include: Not eating or drinking. Taking prescription pain medicine. Getting antibiotic medicines through an IV tube. Getting fluid and food through an IV tube. Having surgery. When you are feeling better, your doctor may tell you to have a test to check your colon (colonoscopy). Follow these instructions at home: Medicines Take over-the-counter and prescription medicines only as told by your doctor. These include: Antibiotics. Pain medicines. Fiber pills. Probiotics. Stool softeners. If you were prescribed an antibiotic medicine, take it as told by your doctor. Do not stop taking the antibiotic even if you start to feel better. Ask your doctor if the medicine prescribed to you requires you to avoid driving or using machinery. Eating and drinking  Follow a diet as told by your doctor. When you feel better, your doctor may tell you to change your diet. You may need to eat a lot of fiber. Fiber makes it easier to poop (have a bowel movement). Foods with fiber include: Berries. Beans. Lentils. Green vegetables. Avoid eating red meat. General instructions Do not use any products that contain nicotine or tobacco, such as cigarettes,  e-cigarettes, and chewing tobacco. If you need help quitting, ask your doctor. Exercise 3 or more times a week. Try to get 30 minutes each time. Exercise enough to sweat and make your heart beat faster. Keep all follow-up visits as told by your doctor. This is important. Contact a doctor if: Your pain does not get better. You are not pooping like normal. Get help right away if: Your pain gets worse. Your symptoms do not get better. Your symptoms get worse very fast. You have a fever. You vomit more than one time. You have poop that is: Bloody. Black. Tarry. Summary This condition happens when small pouches in your colon get infected or swollen. Take medicines only as told by your doctor. Follow a diet as told by your doctor. Keep all follow-up visits as told by your doctor. This is important. This information is not intended to replace advice given to you by your health care provider. Make sure you discuss any questions you have with your health care provider. Document Revised: 01/07/2019 Document Reviewed: 01/07/2019 Elsevier Patient Education  2022 [employer].

## 2021-02-15 NOTE — Progress Notes (Signed)
VIRTUAL VISIT VIA VIDEO  I connected with Carol [name] on 02/15/21 at 11:30 AM EST by elemedicine application and verified that I am speaking with the correct person using two identifiers. Location patient: Home Location provider: [facility], Office Persons participating in the virtual visit: Patient, Dr. Claiborne Billings and Reece Agar. Cesar, CMA  I discussed the limitations of evaluation and management by telemedicine and the availability of in person appointments. The patient expressed understanding and agreed to proceed.   SUBJECTIVE Chief Complaint  Patient presents with   Abdominal Pain    Pt c/o abd pain in the umbilical region describes pain to be aching 7/10, low grade fever, diarrhea 5-6 times a day watery x 2 days    HPI: Carol [name] is a 65 y.o. female present for abdominal discomfort and stool habit changes.  Patient reports for the past 2 days she has had cramping and stomach pain surrounding mid abdomen/umbilicus area.  She endorses a fever of 101.9 F at onset.  She has been having more watery/loose stools approximately 5-6 times daily over the last 2 days.  She reports symptoms are consistent with her prior colitis flare.  She feels anything she eats will go straight through her.  She denies any sick contacts.  She is taking Advil for the fever and discomfort.  She denies blood per rectum.  She has a history of colitis and diverticulitis.  ROS: See pertinent positives and negatives per HPI.  Patient Active Problem List   Diagnosis Date Noted   Serum calcium elevated 12/08/2020   Iron deficiency anemia 12/08/2020   Hot flashes due to menopause 11/06/2019   Obesity 02/11/2019   DM (diabetes mellitus) (HCC) 02/11/2019   Diverticulitis large intestine 10/31/2013   Systolic dysfunction    Gastroesophageal reflux disease    Hyperlipidemia    Hypertension     Social History   Tobacco Use   Smoking status: Never    Passive exposure: Never   Smokeless tobacco: Never   Substance Use Topics   Alcohol use: Yes    Comment: Occassional drinker    Current Outpatient Medications:    aspirin 81 MG tablet, Take 81 mg by mouth at bedtime. , Disp: , Rfl:    candesartan-hydrochlorothiazide (ATACAND HCT) 16-12.5 MG tablet, Take 1 tablet by mouth daily., Disp: 90 tablet, Rfl: 1   cetirizine (ZYRTEC) 10 MG tablet, Take 10 mg by mouth at bedtime. , Disp: , Rfl:    ciprofloxacin (CIPRO) 500 MG tablet, Take 1 tablet (500 mg total) by mouth 2 (two) times daily for 10 days., Disp: 20 tablet, Rfl: 0   citalopram (CELEXA) 20 MG tablet, Take 1 tablet (20 mg total) by mouth daily., Disp: 90 tablet, Rfl: 1   FERREX 150 150 MG capsule, Take by mouth daily., Disp: , Rfl:    Lancets (ONETOUCH DELICA PLUS LANCET33G) MISC, check blood sugar TWICE DAILY, Disp: , Rfl:    linagliptin (TRADJENTA) 5 MG TABS tablet, Take 1 tablet (5 mg total) by mouth daily., Disp: 90 tablet, Rfl: 1   metFORMIN (GLUCOPHAGE-XR) 750 MG 24 hr tablet, Take 1 tablet (750 mg total) by mouth daily with breakfast., Disp: 90 tablet, Rfl: 1   metoprolol succinate (TOPROL-XL) 25 MG 24 hr tablet, Take 1 tablet (25 mg total) by mouth daily. Take with or immediately following a meal., Disp: 90 tablet, Rfl: 1   metroNIDAZOLE (FLAGYL) 500 MG tablet, Take 1 tablet (500 mg total) by mouth 3 (three) times daily for  10 days., Disp: 30 tablet, Rfl: 0   omeprazole (PRILOSEC) 20 MG capsule, Take 1 capsule (20 mg total) by mouth daily., Disp: 90 capsule, Rfl: 1   ondansetron (ZOFRAN) 4 MG tablet, Take 1 tablet (4 mg total) by mouth every 8 (eight) hours as needed for nausea or vomiting., Disp: 20 tablet, Rfl: 0   ONETOUCH ULTRA test strip, test sugars TWICE DAILY as directed, Disp: 50 strip, Rfl: 6   rosuvastatin (CRESTOR) 40 MG tablet, Take 1 tablet (40 mg total) by mouth at bedtime., Disp: 90 tablet, Rfl: 3   timolol (TIMOPTIC) 0.25 % ophthalmic solution, Place 1 drop into the left eye 2 (two) times daily., Disp: , Rfl:     Travoprost, BAK Free, (TRAVATAN) 0.004 % SOLN ophthalmic solution, Place 1 drop into both eyes at bedtime., Disp: , Rfl:   Allergies  Allergen Reactions   Ivp Dye [Iodinated Diagnostic Agents] Hives and Shortness Of Breath    Did ok in OP setting with 13 hr Premedications    Latanoprost    Lisinopril Cough   Onion Rash    Rash and GI upset   Sulfa Antibiotics Hives    OBJECTIVE: There were no vitals taken for this visit. Gen: No acute distress. Nontoxic in appearance.  HENT: AT. [place].  MMM.  Chest: Cough not present Skin: no rashes, purpura or petechiae.  Neuro:  Alert. Oriented x3  Psych: Normal affect and demeanor. Normal speech. Normal thought content and judgment.  ASSESSMENT AND PLAN: Carol [name] is a 65 y.o. female present for  Diverticulitis/Diarrhea, unspecified type Discussed symptoms are consistent with diverticulitis/colitis. Given this is a virtual visit, it is very difficult to risk stratify her and Calcate severity of infection.  Agree to start treatment with cipro/flaygl x 10 days.  Zofran for nausea.  Rest. Hydrate. Replace electrolytes.  Clear liquid diet for 24-48hrs> then advanced as tolerated to bland soft.  No dairy for now.  Pt aware if fever returns, pain worsens, BPR or symptoms remain she is to be seen urgently in ed to r/o abscess or perforation.   Howard Pouch, DO 02/15/2021   No follow-ups on file.  No orders of the defined types were placed in this encounter.  Meds ordered this encounter  Medications   ondansetron (ZOFRAN) 4 MG tablet    Sig: Take 1 tablet (4 mg total) by mouth every 8 (eight) hours as needed for nausea or vomiting.    Dispense:  20 tablet    Refill:  0   ciprofloxacin (CIPRO) 500 MG tablet    Sig: Take 1 tablet (500 mg total) by mouth 2 (two) times daily for 10 days.    Dispense:  20 tablet    Refill:  0   metroNIDAZOLE (FLAGYL) 500 MG tablet    Sig: Take 1 tablet (500 mg total) by mouth 3 (three) times daily for 10  days.    Dispense:  30 tablet    Refill:  0    Referral Orders  No referral(s) requested today

## 2021-02-18 ENCOUNTER — Encounter: Payer: Self-pay | Admitting: Family Medicine

## 2021-03-24 ENCOUNTER — Other Ambulatory Visit: Payer: Self-pay

## 2021-03-24 ENCOUNTER — Ambulatory Visit (INDEPENDENT_AMBULATORY_CARE_PROVIDER_SITE_OTHER): Payer: Medicare Other | Admitting: Family Medicine

## 2021-03-24 ENCOUNTER — Encounter: Payer: Self-pay | Admitting: Family Medicine

## 2021-03-24 VITALS — BP 99/61 | HR 48 | Temp 98.3°F | Ht 63.0 in | Wt 185.0 lb

## 2021-03-24 DIAGNOSIS — E1169 Type 2 diabetes mellitus with other specified complication: Secondary | ICD-10-CM | POA: Diagnosis not present

## 2021-03-24 DIAGNOSIS — D508 Other iron deficiency anemias: Secondary | ICD-10-CM

## 2021-03-24 DIAGNOSIS — K219 Gastro-esophageal reflux disease without esophagitis: Secondary | ICD-10-CM

## 2021-03-24 DIAGNOSIS — E119 Type 2 diabetes mellitus without complications: Secondary | ICD-10-CM

## 2021-03-24 DIAGNOSIS — E669 Obesity, unspecified: Secondary | ICD-10-CM

## 2021-03-24 DIAGNOSIS — I10 Essential (primary) hypertension: Secondary | ICD-10-CM

## 2021-03-24 DIAGNOSIS — E785 Hyperlipidemia, unspecified: Secondary | ICD-10-CM

## 2021-03-24 DIAGNOSIS — Z23 Encounter for immunization: Secondary | ICD-10-CM | POA: Diagnosis not present

## 2021-03-24 LAB — CBC WITH DIFFERENTIAL/PLATELET
Basophils Absolute: 0.3 10*3/uL — ABNORMAL HIGH (ref 0.0–0.1)
Basophils Relative: 2.7 % (ref 0.0–3.0)
Eosinophils Absolute: 0.4 10*3/uL (ref 0.0–0.7)
Eosinophils Relative: 3.8 % (ref 0.0–5.0)
HCT: 38.4 % (ref 36.0–46.0)
Hemoglobin: 12.3 g/dL (ref 12.0–15.0)
Lymphocytes Relative: 29.3 % (ref 12.0–46.0)
Lymphs Abs: 2.8 10*3/uL (ref 0.7–4.0)
MCHC: 32 g/dL (ref 30.0–36.0)
MCV: 80.3 fl (ref 78.0–100.0)
Monocytes Absolute: 0.8 10*3/uL (ref 0.1–1.0)
Monocytes Relative: 8.7 % (ref 3.0–12.0)
Neutro Abs: 5.3 10*3/uL (ref 1.4–7.7)
Neutrophils Relative %: 55.5 % (ref 43.0–77.0)
Platelets: 392 10*3/uL (ref 150.0–400.0)
RBC: 4.78 Mil/uL (ref 3.87–5.11)
RDW: 18.2 % — ABNORMAL HIGH (ref 11.5–15.5)
WBC: 9.5 10*3/uL (ref 4.0–10.5)

## 2021-03-24 LAB — HEMOGLOBIN A1C: Hgb A1c MFr Bld: 6.3 % (ref 4.6–6.5)

## 2021-03-24 LAB — VITAMIN D 25 HYDROXY (VIT D DEFICIENCY, FRACTURES): VITD: 38.06 ng/mL (ref 30.00–100.00)

## 2021-03-24 MED ORDER — ROSUVASTATIN CALCIUM 40 MG PO TABS
40.0000 mg | ORAL_TABLET | Freq: Every day | ORAL | 3 refills | Status: DC
Start: 2021-03-24 — End: 2022-01-10

## 2021-03-24 MED ORDER — CANDESARTAN CILEXETIL-HCTZ 16-12.5 MG PO TABS: 1.0000 | ORAL_TABLET | Freq: Every day | ORAL | 1 refills | Status: DC

## 2021-03-24 MED ORDER — LINAGLIPTIN 5 MG PO TABS: 5.0000 mg | ORAL_TABLET | Freq: Every day | ORAL | 1 refills | Status: DC

## 2021-03-24 MED ORDER — METOPROLOL SUCCINATE ER 25 MG PO TB24: 25.0000 mg | ORAL_TABLET | Freq: Every day | ORAL | 1 refills | Status: DC

## 2021-03-24 MED ORDER — METFORMIN HCL ER 750 MG PO TB24: 750.0000 mg | ORAL_TABLET | Freq: Every day | ORAL | 1 refills | Status: DC

## 2021-03-24 MED ORDER — OMEPRAZOLE 20 MG PO CPDR: 20.0000 mg | DELAYED_RELEASE_CAPSULE | Freq: Every day | ORAL | 1 refills | Status: DC

## 2021-03-24 MED ORDER — CITALOPRAM HYDROBROMIDE 20 MG PO TABS: 20.0000 mg | ORAL_TABLET | Freq: Every day | ORAL | 1 refills | Status: DC

## 2021-03-24 NOTE — Patient Instructions (Addendum)
  Great to see you today.  I have refilled the medication(s) we provide.   If labs were collected, we will inform you of lab results once received either by echart message or telephone call.   - echart message- for normal results that have been seen by the patient already.   - telephone call: abnormal results or if patient has not viewed results in their echart.   Next appt in 5.5 months.  

## 2021-03-24 NOTE — Progress Notes (Signed)
This visit occurred during the SARS-CoV-2 public health emergency.  Safety protocols were in place, including screening questions prior to the visit, additional usage of staff PPE, and extensive cleaning of exam room while observing appropriate contact time as indicated for disinfecting solutions.    Patient ID: Carol [name], female  DOB: 03/05/1956, 65 y.o.   MRN: [id number] Patient Care Team    Relationship Specialty Notifications Start End  Natalia Leatherwood, DO PCP - General Family Medicine  06/10/19   Jake Bathe, MD Consulting Physician Cardiology  06/12/19   Iva Boop, MD Consulting Physician Gastroenterology  06/12/19   Elwin Mocha, MD Consulting Physician Ophthalmology  06/12/19   Frankey Poot, MD Referring Physician Obstetrics and Gynecology  06/12/19   Jessica Priest, MD Consulting Physician Allergy and Immunology  06/12/19     Chief Complaint  Patient presents with   iron amenia     F/u; pt is fasting    Subjective: Carol [name] is a 65 y.o.  Female  present for [facility]. All past medical history, surgical history, allergies, family history, immunizations, medications and social history were updated in the electronic medical record today. All recent labs, ED visits and hospitalizations within the last year were reviewed.  Type 2 diabetes mellitus without complication, without long-term current use of insulin (HCC)/obesity Pt reports compliance with metformin 750 mg daily and tradjenta 5 mg QD. Patient denies dizziness, hyperglycemic or hypoglycemic events. Patient denies numbness, tingling in the extremities or nonhealing wounds of feet. .     Essential hypertension/HTN Pt reports compliance with Toprol-XL 25, candesartan-HCTZ 16-12.5 mg daily, Crestor 40 mg daily and baby aspirin. Patient denies chest pain, shortness of breath, dizziness or lower extremity edema.   Pt takes a daily baby ASA. Pt is  prescribed statin. RF: Hypertension, hyperlipidemia, diabetic,  obesity, family history.    Gastroesophageal reflux disease without esophagitis Patient reports symptoms are well controlled on  her omeprazole 20 mg daily.  She has been unable to discontinue medication in the past without recurrence of symptoms immediately.   Hot flashes: Patient reports celexa 20 mg qd is helpful for her hot flashes.  Iron deficiency anemia: Patient was found to be iron deficient with anemia last labs.  She has started the iron 3 times a week and has felt like it has made her feel better.  She states she does notice the day she does not take it she feels more fatigued.  She is here for lab follow-up for this condition.  Hypercalcemia: Patient was found to have very mild hypercalcemia of 10.5, 10.4 is normal.  She was encouraged to hydrate and avoid any added calcium, which she has followed those directions over the last few months.  There is no calcium Mia disorders within her family that she is aware of.  Her PTH was normal.  Depression screen [facility] 2/9 02/02/2021 11/18/2020 06/29/2020 05/25/2020 11/05/2019  Decreased Interest 0 0 1 0 0  Down, Depressed, Hopeless - 0 0 0 0  PHQ - 2 Score 0 0 1 0 0  Altered sleeping - - 3 - -  Tired, decreased energy - - 1 - -  Change in appetite - - 0 - -  Feeling bad or failure about yourself  - - 0 - -  Trouble concentrating - - 0 - -  Moving slowly or fidgety/restless - - 0 - -  Suicidal thoughts - - 0 - -  PHQ-9 Score - - 5 - -  GAD 7 : Generalized Anxiety Score 06/29/2020  Nervous, Anxious, on Edge 1  Control/stop worrying 0  Worry too much - different things 0  Trouble relaxing 0  Restless 0  Easily annoyed or irritable 0  Afraid - awful might happen 0  Total GAD 7 Score 1    Immunization History  Administered Date(s) Administered   Fluad Quad(high Dose 65+) 02/21/2020, 01/06/2021   Influenza,inj,Quad PF,6+ Mos 03/30/2019   PFIZER(Purple Top)SARS-COV-2 Vaccination 07/02/2019, 07/30/2019, 01/19/2020   Pfizer Covid-19  Vaccine Bivalent Booster 72yrs & up 02/12/2021   Pneumococcal Conjugate-13 03/11/2020   Pneumococcal Polysaccharide-23 03/24/2021   Tdap 08/27/2012   Zoster Recombinat (Shingrix) 11/05/2019, 02/03/2020   Past Medical History:  Diagnosis Date   Angio-edema    Chest discomfort    , Atypical   Chicken pox    COVID-19 05/09/2019   Diabetic acidosis, type II (HCC)    Diverticulitis - large vs small bowel vs both?    Gastroesophageal reflux disease    Glaucoma    Hyperlipidemia    Hypertension    PVC (premature ventricular contraction)    Systolic dysfunction    , Hyperdynamic   Allergies  Allergen Reactions   Ivp Dye [Iodinated Diagnostic Agents] Hives and Shortness Of Breath    Did ok in OP setting with 13 hr Premedications    Latanoprost    Lisinopril Cough   Onion Rash    Rash and GI upset   Sulfa Antibiotics Hives   Past Surgical History:  Procedure Laterality Date   APPENDECTOMY N/A 11/05/2013   Procedure: APPENDECTOMY;  Surgeon: Cherylynn Ridges, MD;  Location: [facility] OR;  Service: General;  Laterality: N/A;   BOWEL RESECTION N/A 11/05/2013   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  "Normal "performed by Dr. Ty Hilts   COLONOSCOPY  04/03/2019   LAPAROTOMY N/A 11/05/2013   Procedure: EXPLORATORY LAPAROTOMY;  Surgeon: Cherylynn Ridges, MD;  Location: [facility] OR;  Service: General;  Laterality: N/A;   Lexiscan  2010   Patient reports normal Lexiscan completed around 2010 by Dr. Ty Hilts.   TOTAL ABDOMINAL HYSTERECTOMY  2004   Family History  Problem Relation Age of Onset   Heart disease Mother    Heart attack Mother    Asthma Mother    Early death Mother    Miscarriages / Stillbirths Mother    Heart disease Father    Lung cancer Father    Heart attack Brother    Asthma Brother    Heart disease Brother    Kidney disease Brother    Diverticulitis Sister    Heart disease Brother    Heart attack Brother     Heart disease Brother    Heart attack Brother    Early death Maternal Grandmother    Tuberculosis Maternal Grandmother    Depression Maternal Grandfather    Heart disease Paternal Grandmother    Heart disease Sister    Colon cancer Neg Hx    Esophageal cancer Neg Hx    Rectal cancer Neg Hx    Stomach cancer Neg Hx    Social History   Social History Narrative   Marital status/children/pets: Married.   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   Safety:      -smoke alarm in the home:Yes     - wears seatbelt: Yes     - Feels safe in their relationships: Yes  Allergies as of 03/24/2021       Reactions   Ivp Dye [iodinated Diagnostic Agents] Hives, Shortness Of Breath   Did ok in OP setting with 13 hr Premedications    Latanoprost    Lisinopril Cough   Onion Rash   Rash and GI upset   Sulfa Antibiotics Hives        Medication List        Accurate as of March 24, 2021 11:59 PM. If you have any questions, ask your nurse or doctor.          aspirin 81 MG tablet Take 81 mg by mouth at bedtime.   candesartan-hydrochlorothiazide 16-12.5 MG tablet Commonly known as: ATACAND HCT Take 1 tablet by mouth daily.   cetirizine 10 MG tablet Commonly known as: ZYRTEC Take 10 mg by mouth at bedtime.   citalopram 20 MG tablet Commonly known as: CELEXA Take 1 tablet (20 mg total) by mouth daily.   Ferrex 150 150 MG capsule Generic drug: iron polysaccharides Take 1 capsule (150 mg total) by mouth daily. What changed: how much to take Changed by: Felix Pacini, DO   linagliptin 5 MG Tabs tablet Commonly known as: TRADJENTA Take 1 tablet (5 mg total) by mouth daily.   metFORMIN 750 MG 24 hr tablet Commonly known as: GLUCOPHAGE-XR Take 1 tablet (750 mg total) by mouth daily with breakfast.   metoprolol succinate 25 MG 24 hr tablet Commonly known as: TOPROL-XL Take 1 tablet (25 mg total) by mouth daily. Take with or immediately  following a meal.   omeprazole 20 MG capsule Commonly known as: PRILOSEC Take 1 capsule (20 mg total) by mouth daily.   ondansetron 4 MG tablet Commonly known as: ZOFRAN Take 1 tablet (4 mg total) by mouth every 8 (eight) hours as needed for nausea or vomiting.   OneTouch Delica Plus Lancet33G Misc check blood sugar TWICE DAILY   OneTouch Ultra test strip Generic drug: glucose blood test sugars TWICE DAILY as directed   rosuvastatin 40 MG tablet Commonly known as: CRESTOR Take 1 tablet (40 mg total) by mouth at bedtime.   timolol 0.25 % ophthalmic solution Commonly known as: TIMOPTIC Place 1 drop into the left eye 2 (two) times daily.   Travoprost (BAK Free) 0.004 % Soln ophthalmic solution Commonly known as: TRAVATAN Place 1 drop into both eyes at bedtime.   vitamin C 1000 MG tablet Take 1,000 mg by mouth daily.        All past medical history, surgical history, allergies, family history, immunizations andmedications were updated in the EMR today and reviewed under the history and medication portions of their EMR.       ROS: 14 pt review of systems performed and negative (unless mentioned in an HPI)  Objective: BP 99/61    Pulse (!) 48    Temp 98.3 F (36.8 C) (Oral)    Ht 5\' 3"  (1.6 m)    Wt 185 lb (83.9 kg)    SpO2 97%    BMI 32.77 kg/m  Physical Exam Vitals and nursing note reviewed.  Constitutional:      General: She is not in acute distress.    Appearance: Normal appearance. She is obese. She is not ill-appearing, toxic-appearing or diaphoretic.  HENT:     Head: Normocephalic and atraumatic.     Mouth/Throat:     Mouth: Mucous membranes are moist.  Eyes:     General: No scleral icterus.       Right  eye: No discharge.        Left eye: No discharge.     Extraocular Movements: Extraocular movements intact.     Conjunctiva/sclera: Conjunctivae normal.     Pupils: Pupils are equal, round, and reactive to light.  Cardiovascular:     Rate and Rhythm:  Normal rate and regular rhythm.  Pulmonary:     Effort: Pulmonary effort is normal. No respiratory distress.     Breath sounds: Normal breath sounds. No wheezing, rhonchi or rales.  Musculoskeletal:     Cervical back: Neck supple. No tenderness.  Lymphadenopathy:     Cervical: No cervical adenopathy.  Skin:    General: Skin is warm and dry.     Coloration: Skin is not jaundiced or pale.     Findings: No erythema or rash.  Neurological:     Mental Status: She is alert and oriented to person, place, and time. Mental status is at baseline.     Motor: No weakness.     Gait: Gait normal.  Psychiatric:        Mood and Affect: Mood normal.        Behavior: Behavior normal.        Thought Content: Thought content normal.        Judgment: Judgment normal.     No results found.  Assessment/plan: Carol [name] is a 65 y.o. female present for CPE/CMC Type 2 diabetes mellitus with hyperlipidemia (HCC)/morbid obesity Continue metformin 750 mg daily Continue tradjenta 5 mg  Continue Crestor 40 mg daily Continue monitoring blood glucose daily PNA series: prevnar 13 completed 03/2020> PNA23 provided today Flu shot: Up-to-date 2021 (recommneded yearly) Foot exam:  Completed 11/2020 Eye exam: Up-to-date  5//2022 Dr. Sherryll Burger A1c: 6.9> 6.1>7.2>5.8 > A1c collected today -Follow-up every 4 months   Essential hypertension/HLD/obesity Stable Continue metoprolol XL at a decreased dose of half tab daily (25 mg dose) Continue candesartan/HCTZ 16-12.5 mg Continue Crestor 40 mg daily Continue baby aspirin daily Follow-up every 4 months with chronic conditions   Gastroesophageal reflux disease without esophagitis Stable Continue omeprazole 20 mg daily   Hot flashes: Stable Continue Celexa 20 mg daily  iron deficiency anemia Currently supplementing every other day with iron.  Since she does feel more fatigued on days she does not take iron, we will encourage her to increase iron daily as long as  iron panel is not over supplemented - Iron, TIBC and Ferritin Panel - CBC with Differential/Platelet  Hypercalcemia She is continuing to hydrate.  We will monitor calcium closely.  Reassured her this is very mildly elevated and no further intervention is needed at this time. - PTH, intact and calcium - VITAMIN D 25 Hydroxy (Vit-D Deficiency, Fractures)    Return in about 24 weeks (around 09/08/2021) for CMC (30 min).   Orders Placed This Encounter  Procedures   Pneumococcal polysaccharide vaccine 23-valent greater than or equal to 2yo subcutaneous/IM   Iron, TIBC and Ferritin Panel   CBC with Differential/Platelet   PTH, intact and calcium   VITAMIN D 25 Hydroxy (Vit-D Deficiency, Fractures)   Hemoglobin A1c    Meds ordered this encounter  Medications   candesartan-hydrochlorothiazide (ATACAND HCT) 16-12.5 MG tablet    Sig: Take 1 tablet by mouth daily.    Dispense:  90 tablet    Refill:  1   linagliptin (TRADJENTA) 5 MG TABS tablet    Sig: Take 1 tablet (5 mg total) by mouth daily.    Dispense:  90 tablet  Refill:  1    May be dispensed in 30-day or 90-day scripts pending upon patient preference and insurance.  Total supply 6 months.   metFORMIN (GLUCOPHAGE-XR) 750 MG 24 hr tablet    Sig: Take 1 tablet (750 mg total) by mouth daily with breakfast.    Dispense:  90 tablet    Refill:  1   citalopram (CELEXA) 20 MG tablet    Sig: Take 1 tablet (20 mg total) by mouth daily.    Dispense:  90 tablet    Refill:  1   metoprolol succinate (TOPROL-XL) 25 MG 24 hr tablet    Sig: Take 1 tablet (25 mg total) by mouth daily. Take with or immediately following a meal.    Dispense:  90 tablet    Refill:  1   omeprazole (PRILOSEC) 20 MG capsule    Sig: Take 1 capsule (20 mg total) by mouth daily.    Dispense:  90 capsule    Refill:  1   rosuvastatin (CRESTOR) 40 MG tablet    Sig: Take 1 tablet (40 mg total) by mouth at bedtime.    Dispense:  90 tablet    Refill:  3   FERREX  150 150 MG capsule    Sig: Take 1 capsule (150 mg total) by mouth daily.    Dispense:  90 capsule    Refill:  3    Referral Orders  No referral(s) requested today     Electronically signed by: Felix Pacini, DO [facility]

## 2021-03-25 ENCOUNTER — Other Ambulatory Visit: Payer: Self-pay | Admitting: Family Medicine

## 2021-03-25 DIAGNOSIS — E669 Obesity, unspecified: Secondary | ICD-10-CM | POA: Insufficient documentation

## 2021-03-25 LAB — PTH, INTACT AND CALCIUM
Calcium: 10.5 mg/dL — ABNORMAL HIGH (ref 8.6–10.4)
PTH: 36 pg/mL (ref 16–77)

## 2021-03-25 LAB — IRON,TIBC AND FERRITIN PANEL
%SAT: 24 % (calc) (ref 16–45)
Ferritin: 21 ng/mL (ref 16–288)
Iron: 102 ug/dL (ref 45–160)
TIBC: 432 mcg/dL (calc) (ref 250–450)

## 2021-03-25 MED ORDER — FERREX 150 150 MG PO CAPS: 150.0000 mg | ORAL_CAPSULE | Freq: Every day | ORAL | 3 refills | Status: DC

## 2021-06-17 DIAGNOSIS — M722 Plantar fascial fibromatosis: Secondary | ICD-10-CM | POA: Diagnosis not present

## 2021-06-17 DIAGNOSIS — E139 Other specified diabetes mellitus without complications: Secondary | ICD-10-CM | POA: Diagnosis not present

## 2021-06-23 ENCOUNTER — Ambulatory Visit: Payer: Medicare Other

## 2021-06-24 ENCOUNTER — Other Ambulatory Visit: Payer: Self-pay

## 2021-06-24 ENCOUNTER — Ambulatory Visit (INDEPENDENT_AMBULATORY_CARE_PROVIDER_SITE_OTHER): Payer: Medicare Other

## 2021-06-24 DIAGNOSIS — Z1231 Encounter for screening mammogram for malignant neoplasm of breast: Secondary | ICD-10-CM | POA: Diagnosis not present

## 2021-07-06 ENCOUNTER — Other Ambulatory Visit: Payer: Self-pay

## 2021-07-06 ENCOUNTER — Ambulatory Visit (INDEPENDENT_AMBULATORY_CARE_PROVIDER_SITE_OTHER): Payer: Medicare Other

## 2021-07-06 DIAGNOSIS — Z0000 Encounter for general adult medical examination without abnormal findings: Secondary | ICD-10-CM

## 2021-07-06 NOTE — Progress Notes (Addendum)
? ?Subjective:  ? Carol [name] is a 66 y.o. female who presents for an Initial Medicare Annual Wellness Visit. ? ? ?I connected with  [name] on 07/06/21 by an audio only telemedicine application and verified that I am speaking with the correct person using two identifiers. ?  ?I discussed the limitations, risks, security and privacy concerns of performing an evaluation and management service by telephone and the availability of in person appointments. I also discussed with the patient that there may be a patient responsible charge related to this service. The patient expressed understanding and verbally consented to this telephonic visit. ? ?Location of Patient: home ?Location of Provider:office ? ?List any persons and their role that are participating in the visit with the patient.  ? ? ?Sorina Derrig ?Harlene Salts ? ?Review of Systems    ?Defer to pcp ?Cardiac Risk Factors include: advanced age (>77men, >54 women) ? ?   ?Objective:  ?  ?There were no vitals filed for this visit. ?There is no height or weight on file to calculate BMI. ? ? ?  07/06/2021  ?  1:43 PM 02/11/2019  ?  6:00 PM 11/01/2013  ?  6:00 AM  ?Advanced Directives  ?Does Patient Have a Medical Advance Directive? Yes No Patient does not have advance directive;Patient would not like information  ?Type of Advance Directive Living will;Healthcare Power of Attorney    ?Does patient want to make changes to medical advance directive? No - Patient declined    ?Would patient like information on creating a medical advance directive?  No - Patient declined   ?Pre-existing out of facility DNR order (yellow form or pink MOST form)   No  ? ? ?Current Medications (verified) ?Outpatient Encounter Medications as of 07/06/2021  ?Medication Sig  ? Ascorbic Acid (VITAMIN C) 1000 MG tablet Take 1,000 mg by mouth daily.  ? aspirin 81 MG tablet Take 81 mg by mouth at bedtime.   ? candesartan-hydrochlorothiazide (ATACAND HCT) 16-12.5 MG tablet Take 1 tablet by mouth  daily.  ? cetirizine (ZYRTEC) 10 MG tablet Take 10 mg by mouth at bedtime.   ? citalopram (CELEXA) 20 MG tablet Take 1 tablet (20 mg total) by mouth daily.  ? FERREX 150 150 MG capsule Take 1 capsule (150 mg total) by mouth daily.  ? Lancets (ONETOUCH DELICA PLUS LANCET33G) MISC check blood sugar TWICE DAILY  ? linagliptin (TRADJENTA) 5 MG TABS tablet Take 1 tablet (5 mg total) by mouth daily.  ? metFORMIN (GLUCOPHAGE-XR) 750 MG 24 hr tablet Take 1 tablet (750 mg total) by mouth daily with breakfast.  ? metoprolol succinate (TOPROL-XL) 25 MG 24 hr tablet Take 1 tablet (25 mg total) by mouth daily. Take with or immediately following a meal.  ? omeprazole (PRILOSEC) 20 MG capsule Take 1 capsule (20 mg total) by mouth daily.  ? ondansetron (ZOFRAN) 4 MG tablet Take 1 tablet (4 mg total) by mouth every 8 (eight) hours as needed for nausea or vomiting.  ? ONETOUCH ULTRA test strip test sugars TWICE DAILY as directed  ? rosuvastatin (CRESTOR) 40 MG tablet Take 1 tablet (40 mg total) by mouth at bedtime.  ? timolol (TIMOPTIC) 0.25 % ophthalmic solution Place 1 drop into the left eye 2 (two) times daily.  ? Travoprost, BAK Free, (TRAVATAN) 0.004 % SOLN ophthalmic solution Place 1 drop into both eyes at bedtime.  ? ?No facility-administered encounter medications on file as of 07/06/2021.  ? ? ?Allergies (verified) ?Ivp dye [iodinated  contrast media], Latanoprost, Lisinopril, Onion, and Sulfa antibiotics  ? ?History: ?Past Medical History:  ?Diagnosis Date  ? Angio-edema   ? Chest discomfort   ? , Atypical  ? Chicken pox   ? COVID-19 05/09/2019  ? Diabetic acidosis, type II (HCC)   ? Diverticulitis - large vs small bowel vs both?   ? Gastroesophageal reflux disease   ? Glaucoma   ? Hyperlipidemia   ? Hypertension   ? PVC (premature ventricular contraction)   ? Systolic dysfunction   ? , Hyperdynamic  ? ?Past Surgical History:  ?Procedure Laterality Date  ? APPENDECTOMY N/A 11/05/2013  ? Procedure: APPENDECTOMY;  Surgeon: Cherylynn RidgesJames  O Wyatt, MD;  [facility];  Service: General;  Laterality: N/A;  ? BOWEL RESECTION N/A 11/05/2013  ? 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 CARDIAC CATHETERIZATION  2010  ? Patient reports cardiac cath approximately 2010.  "Normal "performed by Dr. Ty HiltsEdmonds  ? COLONOSCOPY  04/03/2019  ? LAPAROTOMY N/A 11/05/2013  ? Procedure: EXPLORATORY LAPAROTOMY;  Surgeon: Cherylynn RidgesJames O Wyatt, MD;  [facility];  Service: General;  Laterality: N/A;  ? Lexiscan  2010  ? Patient reports normal Lexiscan completed around 2010 by Dr. Ty HiltsEdmonds.  ? TOTAL ABDOMINAL HYSTERECTOMY  2004  ? ?Family History  ?Problem Relation Age of Onset  ? Heart disease Mother   ? Heart attack Mother   ? Asthma Mother   ? Early death Mother   ? Miscarriages / [place] Mother   ? Heart disease Father   ? Lung cancer Father   ? Heart attack Brother   ? Asthma Brother   ? Heart disease Brother   ? Kidney disease Brother   ? Diverticulitis Sister   ? Heart disease Brother   ? Heart attack Brother   ? Heart disease Brother   ? Heart attack Brother   ? Early death Maternal Grandmother   ? Tuberculosis Maternal Grandmother   ? Depression Maternal Grandfather   ? Heart disease Paternal Grandmother   ? Heart disease Sister   ? Colon cancer Neg Hx   ? Esophageal cancer Neg Hx   ? Rectal cancer Neg Hx   ? Stomach cancer Neg Hx   ? ?Social History  ? ?Socioeconomic History  ? Marital status: Married  ?  Spouse name: Not on file  ? Number of children: Not on file  ? Years of education: Not on file  ? Highest education level: Not on file  ?Occupational History  ? Not on file  ?Tobacco Use  ? Smoking status: Never  ?  Passive exposure: Never  ? Smokeless tobacco: Never  ?Vaping Use  ? Vaping Use: Never used  ?Substance and Sexual Activity  ? Alcohol use: Yes  ?  Comment: Occassional drinker  ? Drug use: No  ? Sexual activity: Not Currently  ?Other Topics Concern  ? Not on file  ?Social History Narrative  ? Marital status/children/pets: Married.  ? Education/employment:  Employed as an Print production planneroffice manager, high school Buyer, retailgraduate.  ? Safety:   ?   -smoke alarm in the home:Yes  ?   - wears seatbelt: Yes  ?   - Feels safe in their relationships: Yes  ? ?Social Determinants of Health  ? ?Financial Resource Strain: Low Risk   ? Difficulty of Paying Living Expenses: Not hard at all  ?Food Insecurity: No Food Insecurity  ? Worried About Programme researcher, broadcasting/film/videounning Out of Food in the Last Year: Never true  ? Ran Out of Food  in the Last Year: Never true  ?Transportation Needs: No Transportation Needs  ? Lack of Transportation (Medical): No  ? Lack of Transportation (Non-Medical): No  ?Physical Activity: Sufficiently Active  ? Days of Exercise per Week: 7 days  ? Minutes of Exercise per Session: 30 min  ?Stress: No Stress Concern Present  ? Feeling of Stress : Not at all  ?Social Connections: Unknown  ? Frequency of Communication with Friends and Family: More than three times a week  ? Frequency of Social Gatherings with Friends and Family: Twice a week  ? Attends Religious Services: More than 4 times per year  ? Active Member of Clubs or Organizations: Not on file  ? Attends Banker Meetings: Not on file  ? Marital Status: Married  ? ? ?Tobacco Counseling ?Counseling given: Not Answered ? ? ?Clinical Intake: ? ?Pre-visit preparation completed: No ? ?Pain : No/denies pain ? ?  ? ?Nutritional Risks: None ?Diabetes: Yes ?CBG done?: No ?Did pt. bring in CBG monitor from home?: No ? ?How often do you need to have someone help you when you read instructions, pamphlets, or other written materials from your doctor or pharmacy?: 1 - Never ? ?Diabetic?yes ? ?Interpreter Needed?: No ? ?  ? ? ?Activities of Daily Living ? ?  07/06/2021  ?  1:44 PM  ?In your present state of health, do you have any difficulty performing the following activities:  ?Hearing? 0  ?Vision? 0  ?Difficulty concentrating or making decisions? 0  ?Walking or climbing stairs? 0  ?Dressing or bathing? 0  ?Doing errands, shopping? 0  ?Preparing  Food and eating ? N  ?Using the Toilet? N  ?In the past six months, have you accidently leaked urine? N  ?Do you have problems with loss of bowel control? N  ?Managing your Medications? N  ?Managing your [place]

## 2021-07-06 NOTE — Patient Instructions (Signed)

## 2021-07-26 ENCOUNTER — Ambulatory Visit (INDEPENDENT_AMBULATORY_CARE_PROVIDER_SITE_OTHER): Payer: Medicare Other | Admitting: Family Medicine

## 2021-07-26 ENCOUNTER — Encounter: Payer: Self-pay | Admitting: Family Medicine

## 2021-07-26 VITALS — BP 96/58 | HR 51 | Temp 98.3°F | Ht 63.0 in | Wt 189.0 lb

## 2021-07-26 DIAGNOSIS — I10 Essential (primary) hypertension: Secondary | ICD-10-CM

## 2021-07-26 DIAGNOSIS — D508 Other iron deficiency anemias: Secondary | ICD-10-CM

## 2021-07-26 DIAGNOSIS — K219 Gastro-esophageal reflux disease without esophagitis: Secondary | ICD-10-CM

## 2021-07-26 DIAGNOSIS — E1169 Type 2 diabetes mellitus with other specified complication: Secondary | ICD-10-CM | POA: Diagnosis not present

## 2021-07-26 DIAGNOSIS — E785 Hyperlipidemia, unspecified: Secondary | ICD-10-CM

## 2021-07-26 DIAGNOSIS — E669 Obesity, unspecified: Secondary | ICD-10-CM

## 2021-07-26 DIAGNOSIS — N951 Menopausal and female climacteric states: Secondary | ICD-10-CM

## 2021-07-26 LAB — MICROALBUMIN / CREATININE URINE RATIO
Creatinine,U: 50.5 mg/dL
Microalb Creat Ratio: 1.4 mg/g (ref 0.0–30.0)
Microalb, Ur: <0.7 mg/dL (ref 0.0–1.9)

## 2021-07-26 LAB — POCT GLYCOSYLATED HEMOGLOBIN (HGB A1C)
HbA1c POC (<> result, manual entry): 6.2 % (ref 4.0–5.6)
HbA1c, POC (controlled diabetic range): 6.2 % (ref 0.0–7.0)
HbA1c, POC (prediabetic range): 6.2 % (ref 5.7–6.4)
Hemoglobin A1C: 6.2 % — AB (ref 4.0–5.6)

## 2021-07-26 MED ORDER — OMEPRAZOLE 20 MG PO CPDR: 20.0000 mg | DELAYED_RELEASE_CAPSULE | Freq: Every day | ORAL | 1 refills | Status: DC

## 2021-07-26 MED ORDER — CITALOPRAM HYDROBROMIDE 20 MG PO TABS: 20.0000 mg | ORAL_TABLET | Freq: Every day | ORAL | 1 refills | Status: DC

## 2021-07-26 MED ORDER — METFORMIN HCL ER 750 MG PO TB24: 750.0000 mg | ORAL_TABLET | Freq: Every day | ORAL | 1 refills | Status: DC

## 2021-07-26 MED ORDER — CANDESARTAN CILEXETIL-HCTZ 16-12.5 MG PO TABS: 1.0000 | ORAL_TABLET | Freq: Every day | ORAL | 1 refills | Status: DC

## 2021-07-26 MED ORDER — METOPROLOL SUCCINATE ER 25 MG PO TB24: 25.0000 mg | ORAL_TABLET | Freq: Every day | ORAL | 1 refills | Status: DC

## 2021-07-26 MED ORDER — LINAGLIPTIN 5 MG PO TABS: 5.0000 mg | ORAL_TABLET | Freq: Every day | ORAL | 1 refills | Status: DC

## 2021-07-26 NOTE — Patient Instructions (Signed)
?  Great to see you today.  ?I have refilled the medication(s) we provide.  ? ?If labs were collected, we will inform you of lab results once received either by echart message or telephone call.  ? - echart message- for normal results that have been seen by the patient already.  ? - telephone call: abnormal results or if patient has not viewed results in their echart. ? ? ?Return in about 24 weeks (around 01/10/2022) for cpe (40 min), Routine chronic condition follow-up. ? ?

## 2021-07-26 NOTE — Progress Notes (Signed)
? ?This visit occurred during the SARS-CoV-2 public health emergency.  Safety protocols were in place, including screening questions prior to the visit, additional usage of staff PPE, and extensive cleaning of exam room while observing appropriate contact time as indicated for disinfecting solutions.  ? ? ?Patient ID: Carol [name], female  DOB: 18-Sep-1955, 66 y.o.   MRN: [id number] ?Patient Care Team  ?  Relationship Specialty Notifications Start End  ?Carol Hillock, DO PCP - General Family Medicine  06/10/19   ?Carol Pain, MD Consulting Physician Cardiology  06/12/19   ?Carol Mayer, MD Consulting Physician Gastroenterology  06/12/19   ?Carol Goltz, MD Consulting Physician Ophthalmology  06/12/19   ?Carol Primes, MD Referring Physician Obstetrics and Gynecology  06/12/19   ?Carol Prows, MD Consulting Physician Allergy and Immunology  06/12/19   ? ? ?Chief Complaint  ?Patient presents with  ? Diabetes  ?  Cmc; pt is fasting  ? ? ?Subjective: ?FLARENCE [name] is a 66 y.o.  Female  present for [facility]. ?All past medical history, surgical history, allergies, family history, immunizations, medications and social history were updated in the electronic medical record today. ?All recent labs, ED visits and hospitalizations within the last year were reviewed. ? ?Type 2 diabetes mellitus without complication, without long-term current use of insulin (HCC)/obesity ?Pt reports compliance  with metformin 750 mg daily and tradjenta 5 mg QD. Patient denies dizziness, hyperglycemic or hypoglycemic events. Patient denies numbness, tingling in the extremities or nonhealing wounds of feet.  ?  ?Essential hypertension/HTN ?Pt reports compliance  with Toprol-XL 25, candesartan-HCTZ 16-12.5 mg daily, Crestor 40 mg daily and baby aspirin. Patient denies chest [name], shortness of breath, dizziness or lower extremity edema.  ?RF: Hypertension, hyperlipidemia, diabetic, obesity, family history.  ?  ?Gastroesophageal reflux disease  without esophagitis ?Patient reports symptoms are well controlled on  her omeprazole 20 mg daily.  She has been unable to discontinue medication in the past without recurrence of symptoms immediately. ?  ?Hot flashes: Patient reports celexa 20 mg qd is helping  ? ?Iron deficiency anemia:Patient was found to be iron deficient with anemia last CPE labs, replacement QOD elevated to normal. She continues supplement and feels better. ? ?Hypercalcemia: Patient was found to have very mild hypercalcemia of 10.5, 10.4 is normal.  She was encouraged to hydrate and avoid any added calcium.Marland Kitchen  Her PTH was normal. Repeat was stable.  ? ? ?  07/26/2021  ?  8:09 AM 07/06/2021  ?  1:43 PM 02/02/2021  ?  1:27 PM 11/18/2020  ?  1:04 PM 06/29/2020  ? 10:41 AM  ?Depression screen PHQ 2/9  ?Decreased Interest 0 0 0 0 1  ?Down, Depressed, Hopeless 0 0  0 0  ?PHQ - 2 Score 0 0 0 0 1  ?Altered sleeping 0    3  ?Tired, decreased energy 3    1  ?Change in appetite 0    0  ?Feeling bad or failure about yourself  0    0  ?Trouble concentrating 0    0  ?Moving slowly or fidgety/restless 0    0  ?Suicidal thoughts 0    0  ?PHQ-9 Score 3    5  ? ? ?  07/26/2021  ?  8:11 AM 06/29/2020  ? 10:41 AM  ?GAD 7 : Generalized Anxiety Score  ?Nervous, Anxious, on Edge 0 1  ?Control/stop worrying 0 0  ?Worry too much - different things 0 0  ?  Trouble relaxing 0 0  ?Restless 0 0  ?Easily annoyed or irritable 0 0  ?Afraid - awful might happen 0 0  ?Total GAD 7 Score 0 1  ? ? ?Immunization History  ?Administered Date(s) Administered  ? Fluad Quad(high Dose 65+) 02/21/2020, 01/06/2021  ? Influenza,inj,Quad PF,6+ Mos 03/30/2019  ? PFIZER(Purple Top)SARS-COV-2 Vaccination 07/02/2019, 07/30/2019, 01/19/2020  ? Pension scheme manager 38yrs & up 02/12/2021  ? Pneumococcal Conjugate-13 03/11/2020  ? Pneumococcal Polysaccharide-23 03/24/2021  ? Tdap 08/27/2012  ? Zoster Recombinat (Shingrix) 11/05/2019, 02/03/2020  ? ?Past Medical History:  ?Diagnosis Date   ? Angio-edema   ? Chest discomfort   ? , Atypical  ? Chicken pox   ? COVID-19 05/09/2019  ? Diabetic acidosis, type II ([place])   ? Diverticulitis - large vs small bowel vs both?   ? Gastroesophageal reflux disease   ? Glaucoma   ? Hyperlipidemia   ? Hypertension   ? PVC (premature ventricular contraction)   ? Systolic dysfunction   ? , Hyperdynamic  ? ?Allergies  ?Allergen Reactions  ? Ivp Dye [Iodinated Contrast Media] Hives and Shortness Of Breath  ?  Did ok in OP setting with 13 hr Premedications   ? Latanoprost   ? Lisinopril Cough  ? Onion Rash  ?  Rash and GI upset  ? Sulfa Antibiotics Hives  ? ?Past Surgical History:  ?Procedure Laterality Date  ? APPENDECTOMY N/A 11/05/2013  ? Procedure: APPENDECTOMY;  Surgeon: Gwenyth Ober, MD;  Location: [place];  Service: General;  Laterality: N/A;  ? BOWEL RESECTION N/A 11/05/2013  ? 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 CARDIAC CATHETERIZATION  2010  ? Patient reports cardiac cath approximately 2010.  "Normal "performed by Dr. Ilda Foil  ? COLONOSCOPY  04/03/2019  ? LAPAROTOMY N/A 11/05/2013  ? Procedure: EXPLORATORY LAPAROTOMY;  Surgeon: Gwenyth Ober, MD;  Location: [place];  Service: General;  Laterality: N/A;  ? Lexiscan  2010  ? Patient reports normal Lexiscan completed around 2010 by Dr. Ilda Foil.  ? TOTAL ABDOMINAL HYSTERECTOMY  2004  ? ?Family History  ?Problem Relation Age of Onset  ? Heart disease Mother   ? Heart attack Mother   ? Asthma Mother   ? Early death Mother   ? Miscarriages / [place] Mother   ? Heart disease Father   ? Lung cancer Father   ? Heart attack Brother   ? Asthma Brother   ? Heart disease Brother   ? Kidney disease Brother   ? Diverticulitis Sister   ? Heart disease Brother   ? Heart attack Brother   ? Heart disease Brother   ? Heart attack Brother   ? Early death Maternal Grandmother   ? Tuberculosis Maternal Grandmother   ? Depression Maternal Grandfather   ? Heart disease Paternal Grandmother   ? Heart disease Sister   ? Colon cancer Neg Hx   ?  Esophageal cancer Neg Hx   ? Rectal cancer Neg Hx   ? Stomach cancer Neg Hx   ? ?Social History  ? ?Social History Narrative  ? Marital status/children/pets: Married.  ? Education/employment: Employed as an Glass blower/designer, high school Writer.  ? Safety:   ?   -smoke alarm in the home:Yes  ?   - wears seatbelt: Yes  ?   - Feels safe in their relationships: Yes  ? ? ?Allergies as of 07/26/2021   ? ?   Reactions  ? Ivp Dye [iodinated Contrast Media] Hives, Shortness Of  Breath  ? Did ok in OP setting with 13 hr Premedications   ? Latanoprost   ? Lisinopril Cough  ? Onion Rash  ? Rash and GI upset  ? Sulfa Antibiotics Hives  ? ?  ? ?  ?Medication List  ?  ? ?  ? Accurate as of July 26, 2021  8:23 AM. If you have any questions, ask your nurse or doctor.  ?  ?  ? ?  ? ?STOP taking these medications   ? ?ondansetron 4 MG tablet ?Commonly known as: ZOFRAN ?Stopped by: Howard Pouch, DO ?  ? ?  ? ?TAKE these medications   ? ?aspirin 81 MG tablet ?Take 81 mg by mouth at bedtime. ?  ?candesartan-hydrochlorothiazide 16-12.5 MG tablet ?Commonly known as: ATACAND HCT ?Take 1 tablet by mouth daily. ?  ?cetirizine 10 MG tablet ?Commonly known as: ZYRTEC ?Take 10 mg by mouth at bedtime. ?  ?citalopram 20 MG tablet ?Commonly known as: CELEXA ?Take 1 tablet (20 mg total) by mouth daily. ?  ?Ferrex 150 150 MG capsule ?Generic drug: iron polysaccharides ?Take 1 capsule (150 mg total) by mouth daily. ?  ?linagliptin 5 MG Tabs tablet ?Commonly known as: TRADJENTA ?Take 1 tablet (5 mg total) by mouth daily. ?  ?metFORMIN 750 MG 24 hr tablet ?Commonly known as: GLUCOPHAGE-XR ?Take 1 tablet (750 mg total) by mouth daily with breakfast. ?  ?metoprolol succinate 25 MG 24 hr tablet ?Commonly known as: TOPROL-XL ?Take 1 tablet (25 mg total) by mouth daily. Take with or immediately following a meal. ?  ?omeprazole 20 MG capsule ?Commonly known as: PRILOSEC ?Take 1 capsule (20 mg total) by mouth daily. ?  ?OneTouch Delica Plus 0000000  Misc ?check blood sugar TWICE DAILY ?  ?OneTouch Ultra test strip ?Generic drug: glucose blood ?test sugars TWICE DAILY as directed ?  ?rosuvastatin 40 MG tablet ?Commonly known as: CRESTOR ?Take 1 tablet (40 mg

## 2021-07-27 DIAGNOSIS — M722 Plantar fascial fibromatosis: Secondary | ICD-10-CM | POA: Diagnosis not present

## 2021-08-21 IMAGING — MG MM DIGITAL SCREENING BILAT W/ TOMO AND CAD
8 series · 8 of 24 positions shown · non-contrast
Comparison: Previous exam(s).

CLINICAL DATA: Screening.

EXAM:
DIGITAL SCREENING BILATERAL MAMMOGRAM WITH TOMOSYNTHESIS AND CAD
TECHNIQUE: Bilateral screening digital craniocaudal and mediolateral oblique
mammograms were obtained. Bilateral screening digital breast
tomosynthesis was performed. The images were evaluated with
computer-aided detection.

[R CC synth-2D]
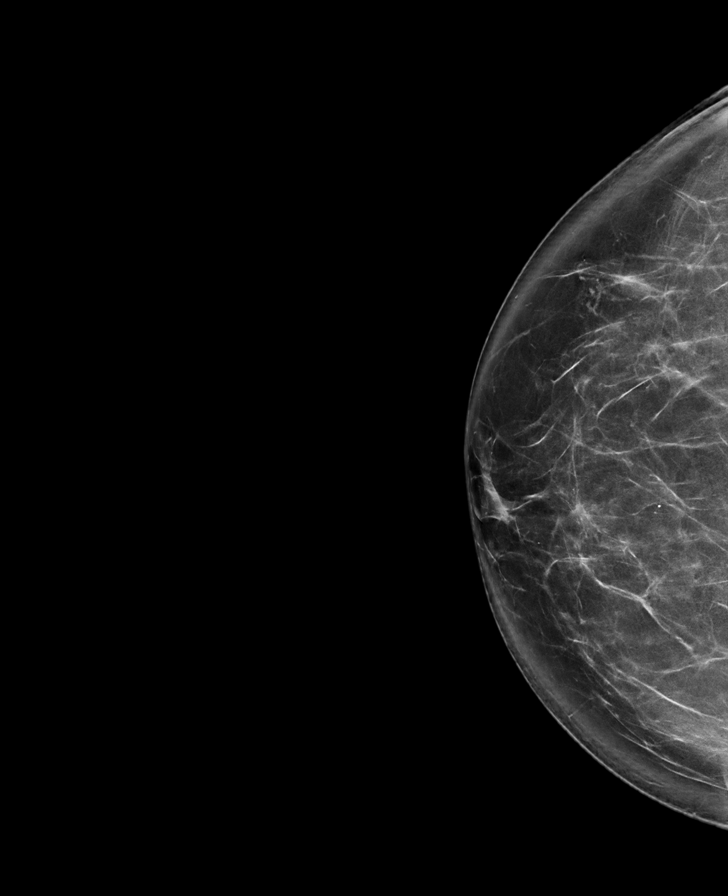

[L MLO synth-2D]
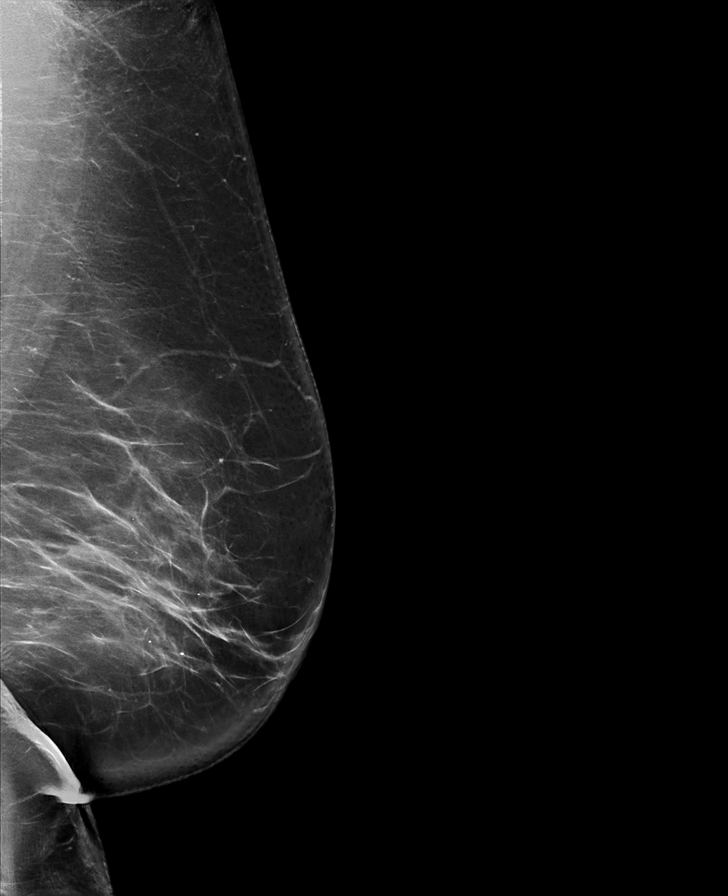

[R MLO synth-2D]
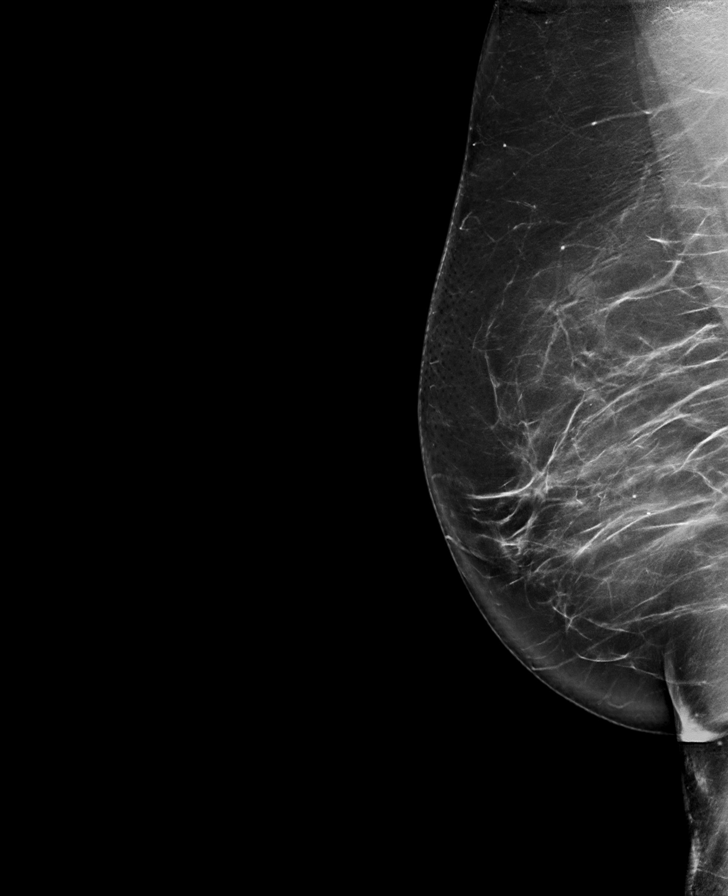

[L CC synth-2D]
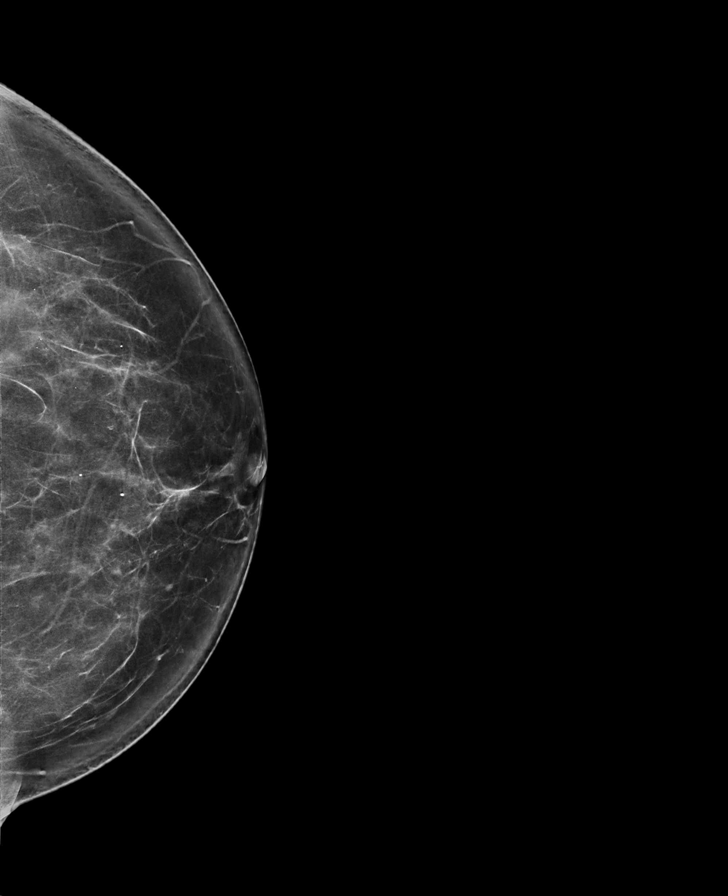

[R CC tomo · tomo slice 45/88.0]
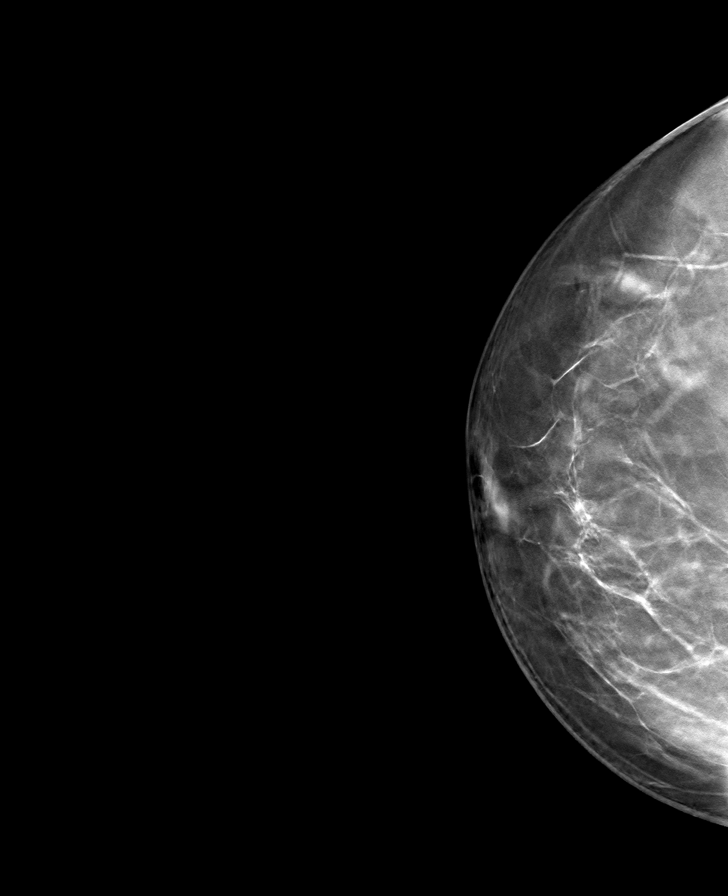

[L CC tomo · tomo slice 41/82.0]
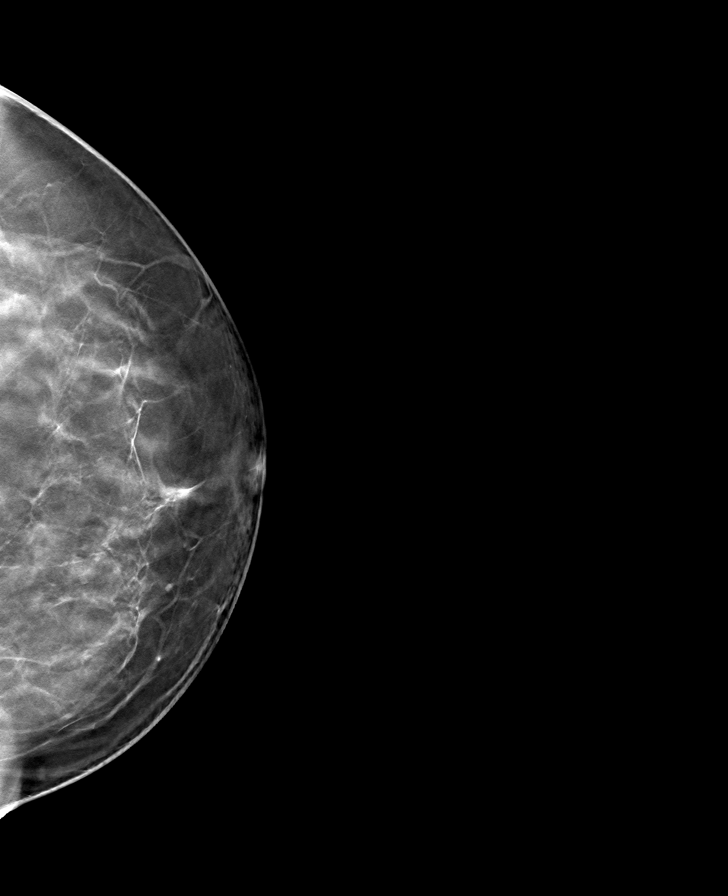

[L MLO tomo · tomo slice 49/98.0]
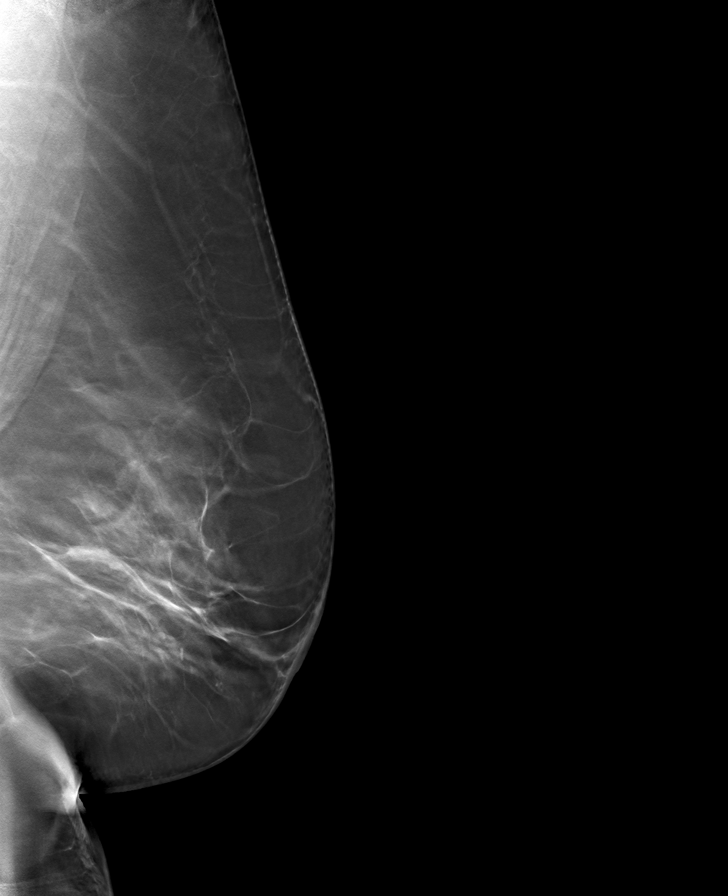

[R MLO tomo · tomo slice 48/95.0]
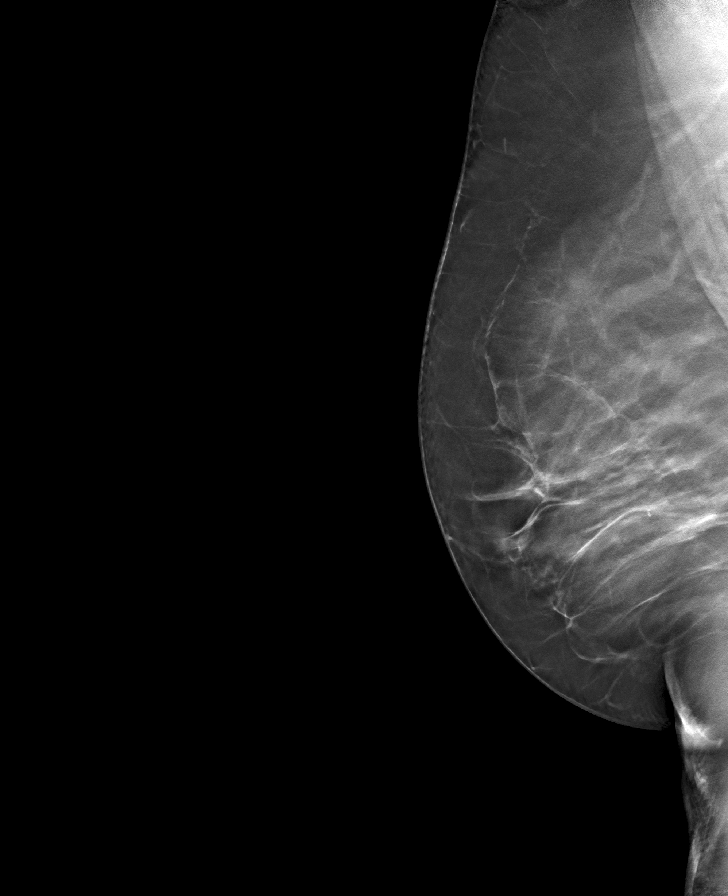

[8 of 24 positions shown; findings below may reference images not displayed]

ACR Breast Density Category b: There are scattered areas of
fibroglandular density.
FINDINGS: There are no findings suspicious for malignancy. The images were
evaluated with computer-aided detection.
IMPRESSION: No mammographic evidence of malignancy. A result letter of this
screening mammogram will be mailed directly to the patient.

RECOMMENDATION:
Screening mammogram in one year. (Code:WJ-[id number])

BI-RADS CATEGORY  1: Negative.

## 2021-09-09 DIAGNOSIS — M792 Neuralgia and neuritis, unspecified: Secondary | ICD-10-CM | POA: Diagnosis not present

## 2021-09-09 DIAGNOSIS — M722 Plantar fascial fibromatosis: Secondary | ICD-10-CM | POA: Diagnosis not present

## 2021-10-11 ENCOUNTER — Ambulatory Visit (INDEPENDENT_AMBULATORY_CARE_PROVIDER_SITE_OTHER): Payer: Medicare Other

## 2021-10-11 ENCOUNTER — Encounter: Payer: Self-pay | Admitting: Family Medicine

## 2021-10-11 ENCOUNTER — Ambulatory Visit (INDEPENDENT_AMBULATORY_CARE_PROVIDER_SITE_OTHER): Payer: Medicare Other | Admitting: Family Medicine

## 2021-10-11 VITALS — BP 107/65 | HR 57 | Temp 98.0°F | Ht 63.0 in | Wt 191.4 lb

## 2021-10-11 DIAGNOSIS — R300 Dysuria: Secondary | ICD-10-CM

## 2021-10-11 DIAGNOSIS — R319 Hematuria, unspecified: Secondary | ICD-10-CM | POA: Diagnosis not present

## 2021-10-11 DIAGNOSIS — S60041A Contusion of right ring finger without damage to nail, initial encounter: Secondary | ICD-10-CM

## 2021-10-11 DIAGNOSIS — N3001 Acute cystitis with hematuria: Secondary | ICD-10-CM

## 2021-10-11 DIAGNOSIS — M7989 Other specified soft tissue disorders: Secondary | ICD-10-CM | POA: Diagnosis not present

## 2021-10-11 LAB — POCT URINALYSIS DIPSTICK
Bilirubin, UA: NEGATIVE
Blood, UA: POSITIVE
Glucose, UA: NEGATIVE
Ketones, UA: NEGATIVE
Nitrite, UA: NEGATIVE
Protein, UA: NEGATIVE
Spec Grav, UA: 1.02 (ref 1.010–1.025)
Urobilinogen, UA: NEGATIVE E.U./dL — AB
pH, UA: 6 (ref 5.0–8.0)

## 2021-10-11 MED ORDER — NITROFURANTOIN MONOHYD MACRO 100 MG PO CAPS: 100.0000 mg | ORAL_CAPSULE | Freq: Two times a day (BID) | ORAL | 0 refills | Status: DC

## 2021-10-11 NOTE — Progress Notes (Signed)
OFFICE VISIT  10/11/2021  CC:  Chief Complaint  Patient presents with   Blood in urine    Has a lot of pressure, urgency and burning. Has not taken any otc meds.    Patient is a 66 y.o. female who presents for blood in urine.  HPI: Acute onset 2 nights ago of frequent urination and urgency.  Has had some dysuria as well.  Some pressure in suprapubic region.  Has noted some gross hematuria. No fever or flank pain.  No nausea.  12/2020 e coli uti, pansensitive  She also notes that she fell after stepping in a hole 2 weeks ago and the next day noted that her right hand hurt.  She did not know exactly how she hurt it during the fall.  It continues to hurt and swell intermittently.   Past Medical History:  Diagnosis Date   Angio-edema    Chest discomfort    , Atypical   Chicken pox    COVID-19 05/09/2019   Diabetic acidosis, type II (HCC)    Diverticulitis - large vs small bowel vs both?    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  "Normal "performed by Dr. Ty Hilts   COLONOSCOPY  04/03/2019   LAPAROTOMY N/A 11/05/2013   Procedure: EXPLORATORY LAPAROTOMY;  Surgeon: Cherylynn Ridges, MD;  Location: [facility] OR;  Service: General;  Laterality: N/A;   Lexiscan  2010   Patient reports normal Lexiscan completed around 2010 by Dr. Ty Hilts.   TOTAL ABDOMINAL HYSTERECTOMY  2004    Outpatient Medications Prior to Visit  Medication Sig Dispense Refill   Ascorbic Acid (VITAMIN C) 1000 MG tablet Take 1,000 mg by mouth daily.      aspirin 81 MG tablet Take 81 mg by mouth at bedtime.      candesartan-hydrochlorothiazide (ATACAND HCT) 16-12.5 MG tablet Take 1 tablet by mouth daily. 90 tablet 1   cetirizine (ZYRTEC) 10 MG tablet Take 10 mg by mouth at bedtime.      citalopram (CELEXA) 20 MG tablet Take 1 tablet (20 mg total) by mouth daily. 90 tablet 1   FERREX 150 150 MG capsule Take 1 capsule (150 mg total) by mouth daily. 90 capsule 3   Lancets (ONETOUCH DELICA PLUS LANCET33G) MISC check blood sugar TWICE DAILY     linagliptin (TRADJENTA) 5 MG TABS tablet Take 1 tablet (5 mg total) by mouth daily. 90 tablet 1   metFORMIN (GLUCOPHAGE-XR) 750 MG 24 hr tablet Take 1 tablet (750 mg total) by mouth daily with breakfast. 90 tablet 1   metoprolol succinate (TOPROL-XL) 25 MG 24 hr tablet Take 1 tablet (25 mg total) by mouth daily. Take with or immediately following a meal. 90 tablet 1   omeprazole (PRILOSEC) 20 MG capsule Take 1 capsule (20 mg total) by mouth daily. 90 capsule 1   ONETOUCH ULTRA test strip test sugars TWICE DAILY as directed 50 strip 6   rosuvastatin (CRESTOR) 40 MG tablet Take 1 tablet (40 mg total)  by mouth at bedtime. 90 tablet 3   timolol (TIMOPTIC) 0.25 % ophthalmic solution Place 1 drop into the left eye 2 (two) times daily.     Travoprost, BAK Free, (TRAVATAN) 0.004 % SOLN ophthalmic solution Place 1 drop into both eyes at bedtime.     No facility-administered medications prior to visit.    Allergies  Allergen Reactions   Ivp Dye [Iodinated Contrast Media] Hives and Shortness Of Breath    Did ok in OP setting with 13 hr Premedications    Latanoprost    Lisinopril Cough   Onion Rash    Rash and GI upset   Sulfa Antibiotics Hives    ROS As per HPI  PE:    10/11/2021    9:57 AM 07/26/2021    8:03 AM 03/24/2021    8:53 AM  Vitals with BMI  Height 5\' 3"  5\' 3"  5\' 3"   Weight 191 lbs 6 oz 189 lbs 185 lbs  BMI 33.91 33.49 32.78  Systolic 107 96 99  Diastolic 65 58 61  Pulse 57 51 48      Physical Exam  Gen: Alert, well appearing.  Patient is oriented to person, place, time, and situation. AFFECT: pleasant, lucid thought and speech. Right hand with some subtle swelling around the distal aspect of the fourth metacarpal dorsally.  Mild tenderness to palpation over this region as well as over the fifth MCP region.  There is no erythema or warmth.  She can make a fist, although this causes some pain, and has normal extension. No tenderness in the anatomic snuffbox. No wrist tenderness or swelling.  LABS:  Last CBC Lab Results  Component Value Date   WBC 9.5 03/24/2021   HGB 12.3 03/24/2021   HCT 38.4 03/24/2021   MCV 80.3 03/24/2021   MCH 25.2 (L) 12/04/2020   RDW 18.2 (H) 03/24/2021   PLT 392.0 03/24/2021   Last metabolic panel Lab Results  Component Value Date   GLUCOSE 64 (L) 11/18/2020   NA 138 11/18/2020   K 4.3 11/18/2020   CL 99 11/18/2020   CO2 24 11/18/2020   BUN 21 11/18/2020   CREATININE 0.68 11/18/2020   GFRNONAA >60 02/13/2019   CALCIUM 10.5 (H) 03/24/2021   PHOS 4.0 11/11/2013   PROT 7.8 11/18/2020   ALBUMIN 4.3 11/05/2019   BILITOT 0.3 11/18/2020   ALKPHOS 85 11/05/2019   AST 20 11/18/2020   ALT 17 11/18/2020   ANIONGAP 9 02/13/2019   Last hemoglobin A1c Lab Results  Component Value Date   HGBA1C 6.2 (A) 07/26/2021   HGBA1C 6.2 07/26/2021   HGBA1C 6.2 07/26/2021   HGBA1C 6.2 07/26/2021   POC CC dipstick UA today: 2+ blood, 3+ LEU, otherwise normal  IMPRESSION AND PLAN:  #1 urinary tract infection with hematuria. Macrobid 100 mg twice daily prescribed.  3-day course recommended but if still trace amount of resolving symptoms at day 3 then she can finish 2 more days of antibiotics. Sent urine for culture and sensitivities.  #2 right hand contusion. Ongoing pain and intermittent swelling. Rule out occult fracture, right hand x-ray ordered today. Recommended buddy taping third through fifth digits.  An After Visit Summary  was printed and given to the patient.  FOLLOW UP: Return if symptoms worsen or fail to improve.  Signed:  07/28/2021, MD           10/11/2021

## 2021-10-14 DIAGNOSIS — E139 Other specified diabetes mellitus without complications: Secondary | ICD-10-CM | POA: Diagnosis not present

## 2021-10-14 LAB — URINE CULTURE
MICRO NUMBER:: 13601314
SPECIMEN QUALITY:: ADEQUATE

## 2021-10-22 ENCOUNTER — Other Ambulatory Visit: Payer: Self-pay | Admitting: Family Medicine

## 2021-11-05 ENCOUNTER — Encounter: Payer: Self-pay | Admitting: Family Medicine

## 2021-11-05 ENCOUNTER — Ambulatory Visit (INDEPENDENT_AMBULATORY_CARE_PROVIDER_SITE_OTHER): Payer: Medicare Other | Admitting: Family Medicine

## 2021-11-05 VITALS — BP 120/70 | HR 61 | Temp 98.3°F | Ht 63.0 in | Wt 198.0 lb

## 2021-11-05 DIAGNOSIS — L2089 Other atopic dermatitis: Secondary | ICD-10-CM | POA: Diagnosis not present

## 2021-11-05 MED ORDER — FLUOCINONIDE 0.05 % EX OINT: 1.0000 | TOPICAL_OINTMENT | Freq: Two times a day (BID) | CUTANEOUS | 2 refills | Status: AC

## 2021-11-05 NOTE — Progress Notes (Signed)
Carol [name] , 19-Dec-1955, 66 y.o., female MRN: [id number] Patient Care Team    Relationship Specialty Notifications Start End  Natalia Leatherwood, DO PCP - General Family Medicine  06/10/19   Jake Bathe, MD Consulting Physician Cardiology  06/12/19   Iva Boop, MD Consulting Physician Gastroenterology  06/12/19   Elwin Mocha, MD Consulting Physician Ophthalmology  06/12/19   Frankey Poot, MD Referring Physician Obstetrics and Gynecology  06/12/19   Jessica Priest, MD Consulting Physician Allergy and Immunology  06/12/19   [facility]  07/26/21     Chief Complaint  Patient presents with   Rash    Pt c/o rash on 3rd right digit with swelling x 2 weeks     Subjective: Pt presents for an OV with complaints of swollen, red rash on her fingers.  She states its been present for over 2 weeks and it is getting worse instead of better.  Typically when she has this type of rash she uses an over-the-counter ointment and the rash resolves.  She denies any known exposures to any chemicals.     07/26/2021    8:09 AM 07/06/2021    1:43 PM 02/02/2021    1:27 PM 11/18/2020    1:04 PM 06/29/2020   10:41 AM  Depression screen PHQ 2/9  Decreased Interest 0 0 0 0 1  Down, Depressed, Hopeless 0 0  0 0  PHQ - 2 Score 0 0 0 0 1  Altered sleeping 0    3  Tired, decreased energy 3    1  Change in appetite 0    0  Feeling bad or failure about yourself  0    0  Trouble concentrating 0    0  Moving slowly or fidgety/restless 0    0  Suicidal thoughts 0    0  PHQ-9 Score 3    5    Allergies  Allergen Reactions   Ivp Dye [Iodinated Contrast Media] Hives and Shortness Of Breath    Did ok in OP setting with 13 hr Premedications    Latanoprost    Lisinopril Cough   Onion Rash    Rash and GI upset   Sulfa Antibiotics Hives   Social History   Social History Narrative   Marital status/children/pets: Married.   Education/employment: Employed as an  Print production planner, high school Buyer, retail.   Safety:      -smoke alarm in the home:Yes     - wears seatbelt: Yes     - Feels safe in their relationships: Yes   Past Medical History:  Diagnosis Date   Angio-edema    Chest discomfort    , Atypical   Chicken pox    COVID-19 05/09/2019   Diabetic acidosis, type II (HCC)    Diverticulitis - large vs small bowel vs both?    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  "Normal "performed by Dr.  Edmonds   COLONOSCOPY  04/03/2019   LAPAROTOMY N/A 11/05/2013   Procedure: EXPLORATORY LAPAROTOMY;  Surgeon: Cherylynn Ridges, MD;  Location: [facility] OR;  Service: General;  Laterality: N/A;   Lexiscan  2010   Patient reports normal Lexiscan completed around 2010 by Dr. Ty Hilts.   TOTAL ABDOMINAL HYSTERECTOMY  2004   Family History  Problem Relation Age of Onset   Heart disease Mother    Heart attack Mother    Asthma Mother    Early death Mother    Miscarriages / Stillbirths Mother    Heart disease Father    Lung cancer Father    Heart attack Brother    Asthma Brother    Heart disease Brother    Kidney disease Brother    Diverticulitis Sister    Heart disease Brother    Heart attack Brother    Heart disease Brother    Heart attack Brother    Early death Maternal Grandmother    Tuberculosis Maternal Grandmother    Depression Maternal Grandfather    Heart disease Paternal Grandmother    Heart disease Sister    Colon cancer Neg Hx    Esophageal cancer Neg Hx    Rectal cancer Neg Hx     Stomach cancer Neg Hx    Allergies as of 11/05/2021       Reactions   Ivp Dye [iodinated Contrast Media] Hives, Shortness Of Breath   Did ok in OP setting with 13 hr Premedications    Latanoprost    Lisinopril Cough   Onion Rash   Rash and GI upset   Sulfa Antibiotics Hives        Medication List        Accurate as of November 05, 2021  4:47 PM. If you have any questions, ask your nurse or doctor.          aspirin 81 MG tablet Take 81 mg by mouth at bedtime.   candesartan-hydrochlorothiazide 16-12.5 MG tablet Commonly known as: ATACAND HCT Take 1 tablet by mouth daily.   cetirizine 10 MG tablet Commonly known as: ZYRTEC Take 10 mg by mouth at bedtime.   citalopram 20 MG tablet Commonly known as: CELEXA Take 1 tablet (20 mg total) by mouth daily.   Ferrex 150 150 MG capsule Generic drug: iron polysaccharides Take 1 capsule (150 mg total) by mouth daily.   fluocinonide ointment 0.05 % Commonly known as: LIDEX Apply 1 Application topically 2 (two) times daily. Started by: Felix Pacini, DO   linagliptin 5 MG Tabs tablet Commonly known as: TRADJENTA Take 1 tablet (5 mg total) by mouth daily.   metFORMIN 750 MG 24 hr tablet Commonly known as: GLUCOPHAGE-XR Take 1 tablet (750 mg total) by mouth daily with breakfast.   metoprolol succinate 25 MG 24 hr tablet Commonly known as: TOPROL-XL Take 1 tablet (25 mg total) by mouth daily. Take with or immediately following a meal.   nitrofurantoin (macrocrystal-monohydrate) 100 MG capsule Commonly known as: Macrobid Take 1 capsule (100 mg total) by mouth 2 (two) times daily.   omeprazole 20 MG capsule Commonly known as: PRILOSEC Take 1 capsule (20 mg total) by mouth daily.   OneTouch Delica Plus Lancet33G Misc check blood sugar TWICE DAILY   OneTouch Ultra test strip Generic drug: glucose blood test sugars TWICE DAILY as directed   rosuvastatin 40 MG tablet Commonly known as: CRESTOR Take 1 tablet (40 mg  total) by mouth at bedtime.   timolol 0.25 % ophthalmic  solution Commonly known as: TIMOPTIC Place 1 drop into the left eye 2 (two) times daily.   Travoprost (BAK Free) 0.004 % Soln ophthalmic solution Commonly known as: TRAVATAN Place 1 drop into both eyes at bedtime.   vitamin C 1000 MG tablet Take 1,000 mg by mouth daily.        All past medical history, surgical history, allergies, family history, immunizations andmedications were updated in the EMR today and reviewed under the history and medication portions of their EMR.     ROS Negative, with the exception of above mentioned in HPI   Objective:  BP 120/70   Pulse 61   Temp 98.3 F (36.8 C) (Oral)   Ht 5\' 3"  (1.6 m)   Wt 198 lb (89.8 kg)   SpO2 96%   BMI 35.07 kg/m  Body mass index is 35.07 kg/m.  Physical Exam Vitals and nursing note reviewed.  Constitutional:      General: She is not in acute distress.    Appearance: Normal appearance. She is normal weight. She is not ill-appearing or toxic-appearing.  Eyes:     Extraocular Movements: Extraocular movements intact.     Conjunctiva/sclera: Conjunctivae normal.     Pupils: Pupils are equal, round, and reactive to light.  Skin:    Findings: Rash present.     Comments: Erythema base with raised red papules between third and fourth fingers of right hand.  Neurological:     Mental Status: She is alert and oriented to person, place, and time. Mental status is at baseline.  Psychiatric:        Mood and Affect: Mood normal.        Behavior: Behavior normal.        Thought Content: Thought content normal.        Judgment: Judgment normal.      No results found. No results found. No results found for this or any previous visit (from the past 24 hour(s)).  Assessment/Plan: Carol [name] is a 66 y.o. female present for OV for  1. Other atopic dermatitis This is recurrent for her throughout the year. No signs of  infection Vanos ointment prescribed twice  daily Encouraged her to use CeraVe healing ointment after showers. Follow-up as needed  Reviewed expectations re: course of current medical issues. Discussed self-management of symptoms. Outlined signs and symptoms indicating need for more acute intervention. Patient verbalized understanding and all questions were answered. Patient received an After-Visit Summary.    No orders of the defined types were placed in this encounter.  Meds ordered this encounter  Medications   fluocinonide ointment (LIDEX) 0.05 %    Sig: Apply 1 Application topically 2 (two) times daily.    Dispense:  30 g    Refill:  2   Referral Orders  No referral(s) requested today     Note is dictated utilizing voice recognition software. Although note has been proof read prior to signing, occasional typographical errors still can be missed. If any questions arise, please do not hesitate to call for verification.   electronically signed by:  Howard Pouch, DO  [place]

## 2021-11-22 ENCOUNTER — Encounter: Payer: Self-pay | Admitting: Family Medicine

## 2021-11-22 ENCOUNTER — Ambulatory Visit (INDEPENDENT_AMBULATORY_CARE_PROVIDER_SITE_OTHER): Payer: Medicare Other | Admitting: Family Medicine

## 2021-11-22 VITALS — BP 103/60 | HR 53 | Temp 97.6°F | Ht 63.0 in | Wt 195.0 lb

## 2021-11-22 DIAGNOSIS — R8290 Unspecified abnormal findings in urine: Secondary | ICD-10-CM

## 2021-11-22 DIAGNOSIS — R300 Dysuria: Secondary | ICD-10-CM | POA: Diagnosis not present

## 2021-11-22 LAB — POCT URINALYSIS DIPSTICK
Bilirubin, UA: NEGATIVE
Blood, UA: 80
Glucose, UA: NEGATIVE
Ketones, UA: NEGATIVE
Nitrite, UA: NEGATIVE
Protein, UA: NEGATIVE
Spec Grav, UA: 1.01 (ref 1.010–1.025)
Urobilinogen, UA: 0.2 E.U./dL
pH, UA: 6 (ref 5.0–8.0)

## 2021-11-22 MED ORDER — CEFDINIR 300 MG PO CAPS: 300.0000 mg | ORAL_CAPSULE | Freq: Two times a day (BID) | ORAL | 0 refills | Status: AC

## 2021-11-22 NOTE — Patient Instructions (Signed)
Azo  "Pyridium " can help with symptoms.  Called in omnicef every 12 hours for 7 days.

## 2021-11-22 NOTE — Progress Notes (Signed)
Carol [name] , 05-Jun-1955, 66 y.o., female MRN: [id number] Patient Care Team    Relationship Specialty Notifications Start End  Natalia Leatherwood, DO PCP - General Family Medicine  06/10/19   Jake Bathe, MD Consulting Physician Cardiology  06/12/19   Iva Boop, MD Consulting Physician Gastroenterology  06/12/19   Elwin Mocha, MD Consulting Physician Ophthalmology  06/12/19   Frankey Poot, MD Referring Physician Obstetrics and Gynecology  06/12/19   Jessica Priest, MD Consulting Physician Allergy and Immunology  06/12/19   [facility]  07/26/21     Chief Complaint  Patient presents with   Dysuria    Pt c/o urinary freq/urgency, dysuria, back pain x 2 days     Subjective: Pt presents for an OV with complaints of urinary frequency and urgency of 2 days duration. She also endorses lower back pain. She has been drinking water to help. Last UTI early July pan-sensitive E.coli. treated w/ macrobid bid by another provider.      07/26/2021    8:09 AM 07/06/2021    1:43 PM 02/02/2021    1:27 PM 11/18/2020    1:04 PM 06/29/2020   10:41 AM  Depression screen PHQ 2/9  Decreased Interest 0 0 0 0 1  Down, Depressed, Hopeless 0 0  0 0  PHQ - 2 Score 0 0 0 0 1  Altered sleeping 0    3  Tired, decreased energy 3    1  Change in appetite 0    0  Feeling bad or failure about yourself  0    0  Trouble concentrating 0    0  Moving slowly or fidgety/restless 0    0  Suicidal thoughts 0    0  PHQ-9 Score 3    5    Allergies  Allergen Reactions   Ivp Dye [Iodinated Contrast Media] Hives and Shortness Of Breath    Did ok in OP setting with 13 hr Premedications    Latanoprost    Lisinopril Cough   Onion Rash    Rash and GI upset   Sulfa Antibiotics Hives   Social History   Social History Narrative   Marital status/children/pets: Married.   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   Safety:      -smoke  alarm in the home:Yes     - wears seatbelt: Yes     - Feels safe in their relationships: Yes   Past Medical History:  Diagnosis Date   Angio-edema    Chest discomfort    , Atypical   Chicken pox    COVID-19 05/09/2019   Diabetic acidosis, type II (HCC)    Diverticulitis - large vs small bowel vs both?    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  "Normal "performed by Dr. Ty Hilts   COLONOSCOPY  04/03/2019   LAPAROTOMY N/A 11/05/2013   Procedure: EXPLORATORY LAPAROTOMY;  Surgeon:  Gwenyth Ober, MD;  Location: [facility];  Service: General;  Laterality: N/ALuvenia Starch   Patient reports normal Lexiscan completed around 2010 by Dr. Ilda Foil.   TOTAL ABDOMINAL HYSTERECTOMY  2004   Family History  Problem Relation Age of Onset   Heart disease Mother    Heart attack Mother    Asthma Mother    Early death Mother    Miscarriages / Stillbirths Mother    Heart disease Father    Lung cancer Father    Heart attack Brother    Asthma Brother    Heart disease Brother    Kidney disease Brother    Diverticulitis Sister    Heart disease Brother    Heart attack Brother    Heart disease Brother    Heart attack Brother    Early death Maternal Grandmother    Tuberculosis Maternal Grandmother    Depression Maternal Grandfather    Heart disease Paternal Grandmother    Heart disease Sister    Colon cancer Neg Hx    Esophageal cancer Neg Hx    Rectal cancer Neg Hx    Stomach cancer Neg Hx    Allergies as of 11/22/2021        Reactions   Ivp Dye [iodinated Contrast Media] Hives, Shortness Of Breath   Did ok in OP setting with 13 hr Premedications    Latanoprost    Lisinopril Cough   Onion Rash   Rash and GI upset   Sulfa Antibiotics Hives        Medication List        Accurate as of November 22, 2021 10:50 AM. If you have any questions, ask your nurse or doctor.          aspirin 81 MG tablet Take 81 mg by mouth at bedtime.   candesartan-hydrochlorothiazide 16-12.5 MG tablet Commonly known as: ATACAND HCT Take 1 tablet by mouth daily.   cefdinir 300 MG capsule Commonly known as: OMNICEF Take 1 capsule (300 mg total) by mouth 2 (two) times daily for 7 days. Started by: Howard Pouch, DO   cetirizine 10 MG tablet Commonly known as: ZYRTEC Take 10 mg by mouth at bedtime.   citalopram 20 MG tablet Commonly known as: CELEXA Take 1 tablet (20 mg total) by mouth daily.   Ferrex 150 150 MG capsule Generic drug: iron polysaccharides Take 1 capsule (150 mg total) by mouth daily.   fluocinonide ointment 0.05 % Commonly known as: LIDEX Apply 1 Application topically 2 (two) times daily.   linagliptin 5 MG Tabs tablet Commonly known as: TRADJENTA Take 1 tablet (5 mg total) by mouth daily.   metFORMIN 750 MG 24 hr tablet Commonly known as: GLUCOPHAGE-XR Take 1 tablet (750 mg total) by mouth daily with breakfast.   metoprolol succinate 25 MG 24 hr tablet Commonly known as: TOPROL-XL Take 1 tablet (25 mg total) by mouth daily. Take with or immediately following a meal.   nitrofurantoin (macrocrystal-monohydrate) 100 MG capsule Commonly known as: Macrobid Take 1 capsule (100 mg total) by mouth 2 (two) times daily.   omeprazole 20 MG capsule Commonly known as: PRILOSEC Take 1 capsule (20 mg total) by mouth daily.   OneTouch Delica Plus 0000000 Misc check blood sugar TWICE DAILY   OneTouch Ultra test strip Generic drug: glucose blood test sugars TWICE DAILY as directed   rosuvastatin  40 MG tablet Commonly known as: CRESTOR Take 1 tablet (40 mg total) by mouth at bedtime.  timolol 0.25 % ophthalmic solution Commonly known as: TIMOPTIC Place 1 drop into the left eye 2 (two) times daily.   Travoprost (BAK Free) 0.004 % Soln ophthalmic solution Commonly known as: TRAVATAN Place 1 drop into both eyes at bedtime.   vitamin C 1000 MG tablet Take 1,000 mg by mouth daily.        All past medical history, surgical history, allergies, family history, immunizations andmedications were updated in the EMR today and reviewed under the history and medication portions of their EMR.     Review of Systems  Constitutional:  Negative for chills, fever and malaise/fatigue.  Genitourinary:  Positive for dysuria, frequency and urgency. Negative for flank pain and hematuria.  Musculoskeletal:  Positive for back pain.  Skin:  Negative for rash.   Negative, with the exception of above mentioned in HPI   Objective:  BP 103/60   Pulse (!) 53   Temp 97.6 F (36.4 C) (Oral)   Ht 5\' 3"  (1.6 m)   Wt 195 lb (88.5 kg)   SpO2 98%   BMI 34.54 kg/m  Body mass index is 34.54 kg/m. Physical Exam Vitals and nursing note reviewed.  Constitutional:      General: She is not in acute distress.    Appearance: Normal appearance. She is normal weight. She is not ill-appearing or toxic-appearing.  HENT:     Mouth/Throat:     Mouth: Mucous membranes are moist.  Eyes:     Extraocular Movements: Extraocular movements intact.     Conjunctiva/sclera: Conjunctivae normal.     Pupils: Pupils are equal, round, and reactive to light.  Cardiovascular:     Rate and Rhythm: Normal rate and regular rhythm.  Abdominal:     Tenderness: There is abdominal tenderness (suprapubic). There is no right CVA tenderness or left CVA tenderness.  Musculoskeletal:     Right lower leg: No edema.     Left lower leg: No edema.  Skin:    Findings: No rash.  Neurological:     Mental Status: She is alert and  oriented to person, place, and time. Mental status is at baseline.  Psychiatric:        Mood and Affect: Mood normal.        Behavior: Behavior normal.        Thought Content: Thought content normal.        Judgment: Judgment normal.     No results found. No results found. Results for orders placed or performed in visit on 11/22/21 (from the past 24 hour(s))  POCT Urinalysis Dipstick     Status: Abnormal   Collection Time: 11/22/21 10:34 AM  Result Value Ref Range   Color, UA light yellow    Clarity, UA cloudy    Glucose, UA Negative Negative   Bilirubin, UA neg    Ketones, UA neg    Spec Grav, UA 1.010 1.010 - 1.025   Blood, UA 80 ery/uL    pH, UA 6.0 5.0 - 8.0   Protein, UA Negative Negative   Urobilinogen, UA 0.2 0.2 or 1.0 E.U./dL   Nitrite, UA neg    Leukocytes, UA Moderate (2+) (A) Negative   Appearance     Odor      Assessment/Plan: JEROLDINE PRIME is a 66 y.o. female present for OV for   Dysuria/Abnormal urine Appears infectious by poct today with 2+ leuks.  Elected to treat with omnicef BID hydrate - POCT Urinalysis Dipstick - Urinalysis w microscopic + reflex cultur -  f/u 2 weeks if needed  Reviewed expectations re: course of current medical issues. Discussed self-management of symptoms. Outlined signs and symptoms indicating need for more acute intervention. Patient verbalized understanding and all questions were answered. Patient received an After-Visit Summary.    Orders Placed This Encounter  Procedures   Urinalysis w microscopic + reflex cultur   POCT Urinalysis Dipstick   Meds ordered this encounter  Medications   cefdinir (OMNICEF) 300 MG capsule    Sig: Take 1 capsule (300 mg total) by mouth 2 (two) times daily for 7 days.    Dispense:  14 capsule    Refill:  0   Referral Orders  No referral(s) requested today     Note is dictated utilizing voice recognition software. Although note has been proof read prior to signing, occasional  typographical errors still can be missed. If any questions arise, please do not hesitate to call for verification.   electronically signed by:  Felix Pacini, DO  [facility] - OR

## 2021-11-25 ENCOUNTER — Telehealth: Payer: Self-pay | Admitting: Family Medicine

## 2021-11-25 LAB — URINALYSIS W MICROSCOPIC + REFLEX CULTURE
Bacteria, UA: NONE SEEN /HPF
Bilirubin Urine: NEGATIVE
Glucose, UA: NEGATIVE
Hyaline Cast: NONE SEEN /LPF
Ketones, ur: NEGATIVE
Nitrites, Initial: NEGATIVE
Protein, ur: NEGATIVE
RBC / HPF: NONE SEEN /HPF (ref 0–2)
Specific Gravity, Urine: 1.006 (ref 1.001–1.035)
Squamous Epithelial / HPF: NONE SEEN /HPF (ref <=5)
WBC, UA: >=60 /HPF — AB (ref 0–5)
pH: 5.5 (ref 5.0–8.0)

## 2021-11-25 LAB — URINE CULTURE
MICRO NUMBER:: 13779403
SPECIMEN QUALITY:: ADEQUATE

## 2021-11-25 LAB — CULTURE INDICATED

## 2021-11-25 NOTE — Telephone Encounter (Signed)
Please inform patient her urine culture resulted with E. coli infection.  The antibiotic prescribed to her we will treat appropriately.

## 2021-11-25 NOTE — Telephone Encounter (Signed)
Spoke with patiert regarding results/recommendations,voiced understanding. She would like to do another urine sample in a few weeks to ensure infection is gone.  Please advise if okay.

## 2021-11-26 NOTE — Telephone Encounter (Signed)
Spoke with pt regarding labs and instructions.   

## 2021-11-26 NOTE — Telephone Encounter (Signed)
I agree we need to recollect a specimen in a couple weeks after treatment to ensure complete resolution of bacteria since she has had a recurrent infection. She can either be a virtual visit and come in for a lab appointment only earlier in the day to drop off the urine urine or she can be a regular scheduled appointment the week of Labor Day. Would not recommended repeating prior to that date, as it may read a false negative.

## 2021-12-27 ENCOUNTER — Ambulatory Visit (INDEPENDENT_AMBULATORY_CARE_PROVIDER_SITE_OTHER): Payer: Medicare Other | Admitting: Family Medicine

## 2021-12-27 ENCOUNTER — Encounter: Payer: Self-pay | Admitting: Family Medicine

## 2021-12-27 VITALS — BP 114/70 | HR 63 | Temp 97.7°F | Ht 63.0 in | Wt 193.0 lb

## 2021-12-27 DIAGNOSIS — R300 Dysuria: Secondary | ICD-10-CM | POA: Diagnosis not present

## 2021-12-27 DIAGNOSIS — E1169 Type 2 diabetes mellitus with other specified complication: Secondary | ICD-10-CM | POA: Diagnosis not present

## 2021-12-27 DIAGNOSIS — N390 Urinary tract infection, site not specified: Secondary | ICD-10-CM | POA: Diagnosis not present

## 2021-12-27 DIAGNOSIS — E785 Hyperlipidemia, unspecified: Secondary | ICD-10-CM | POA: Diagnosis not present

## 2021-12-27 LAB — POCT URINALYSIS DIPSTICK
Bilirubin, UA: NEGATIVE
Blood, UA: NEGATIVE
Glucose, UA: NEGATIVE
Ketones, UA: NEGATIVE
Leukocytes, UA: NEGATIVE
Nitrite, UA: NEGATIVE
Odor: POSITIVE
Protein, UA: NEGATIVE
Spec Grav, UA: 1.025 (ref 1.010–1.025)
Urobilinogen, UA: 0.2 E.U./dL
pH, UA: 6 (ref 5.0–8.0)

## 2021-12-27 MED ORDER — ONETOUCH DELICA PLUS LANCET33G MISC: 11 refills | Status: DC

## 2021-12-27 MED ORDER — NITROFURANTOIN MONOHYD MACRO 100 MG PO CAPS: 100.0000 mg | ORAL_CAPSULE | Freq: Every day | ORAL | 1 refills | Status: DC

## 2021-12-27 MED ORDER — ONETOUCH ULTRA VI STRP: ORAL_STRIP | 6 refills | Status: DC

## 2021-12-27 MED ORDER — NITROFURANTOIN MONOHYD MACRO 100 MG PO CAPS: 100.0000 mg | ORAL_CAPSULE | Freq: Two times a day (BID) | ORAL | 0 refills | Status: DC

## 2021-12-27 NOTE — Patient Instructions (Signed)
Urinary Tract Infection, Adult A urinary tract infection (UTI) is an infection of any part of the urinary tract. The urinary tract includes: The kidneys. The ureters. The bladder. The urethra. These organs make, store, and get rid of pee (urine) in the body. What are the causes? This infection is caused by germs (bacteria) in your genital area. These germs grow and cause swelling (inflammation) of your urinary tract. What increases the risk? The following factors may make you more likely to develop this condition: Using a small, thin tube (catheter) to drain pee. Not being able to control when you pee or poop (incontinence). Being female. If you are female, these things can increase the risk: Using these methods to prevent pregnancy: A medicine that kills sperm (spermicide). A device that blocks sperm (diaphragm). Having low levels of a female hormone (estrogen). Being pregnant. You are more likely to develop this condition if: You have genes that add to your risk. You are sexually active. You take antibiotic medicines. You have trouble peeing because of: A prostate that is bigger than normal, if you are female. A blockage in the part of your body that drains pee from the bladder. A kidney stone. A nerve condition that affects your bladder. Not getting enough to drink. Not peeing often enough. You have other conditions, such as: Diabetes. A weak disease-fighting system (immune system). Sickle cell disease. Gout. Injury of the spine. What are the signs or symptoms? Symptoms of this condition include: Needing to pee right away. Peeing small amounts often. Pain or burning when peeing. Blood in the pee. Pee that smells bad or not like normal. Trouble peeing. Pee that is cloudy. Fluid coming from the vagina, if you are female. Pain in the belly or lower back. Other symptoms include: Vomiting. Not feeling hungry. Feeling mixed up (confused). This may be the first symptom in  older adults. Being tired and grouchy (irritable). A fever. Watery poop (diarrhea). How is this treated? Taking antibiotic medicine. Taking other medicines. Drinking enough water. In some cases, you may need to see a specialist. Follow these instructions at home:  Medicines Take over-the-counter and prescription medicines only as told by your doctor. If you were prescribed an antibiotic medicine, take it as told by your doctor. Do not stop taking it even if you start to feel better. General instructions Make sure you: Pee until your bladder is empty. Do not hold pee for a long time. Empty your bladder after sex. Wipe from front to back after peeing or pooping if you are a female. Use each tissue one time when you wipe. Drink enough fluid to keep your pee pale yellow. Keep all follow-up visits. Contact a doctor if: You do not get better after 1-2 days. Your symptoms go away and then come back. Get help right away if: You have very bad back pain. You have very bad pain in your lower belly. You have a fever. You have chills. You feeling like you will vomit or you vomit. Summary A urinary tract infection (UTI) is an infection of any part of the urinary tract. This condition is caused by germs in your genital area. There are many risk factors for a UTI. Treatment includes antibiotic medicines. Drink enough fluid to keep your pee pale yellow. This information is not intended to replace advice given to you by your health care provider. Make sure you discuss any questions you have with your health care provider. Document Revised: 11/08/2019 Document Reviewed: 11/08/2019 Elsevier Patient Education    2023 [employer]  

## 2021-12-27 NOTE — Progress Notes (Signed)
Carol SnareJill D [name] , 07/08/1955, 66 y.o., female MRN: [id number] Patient Care Team    Relationship Specialty Notifications Start End  Natalia LeatherwoodKuneff, Chiron Campione A, DO PCP - General Family Medicine  06/10/19   Jake BatheSkains, Mark C, MD Consulting Physician Cardiology  06/12/19   Iva BoopGessner, Carl E, MD Consulting Physician Gastroenterology  06/12/19   Elwin MochaShah, Christopher Tulip, MD Consulting Physician Ophthalmology  06/12/19   Frankey Pootamsay, Laura, MD Referring Physician Obstetrics and Gynecology  06/12/19   Jessica PriestKozlow, Eric J, MD Consulting Physician Allergy and Immunology  06/12/19   [facility]  07/26/21     Chief Complaint  Patient presents with   Dysuria    Pt c/o urinary freq/urgency, dysuria, back pain x 3 days     Subjective: Pt presents for an OV with complaints of urinary frequency and urgency of 3 days duration. Last UTI 11/2021 and 10/2021, 1st tx with macrobid and 2nd tx with omnicef both infections were pan-sensitive E.coli. She endorses complete resolution of symptoms after abx use.  She also endorses lower back pain. She has been drinking water to help      07/26/2021    8:09 AM 07/06/2021    1:43 PM 02/02/2021    1:27 PM 11/18/2020    1:04 PM 06/29/2020   10:41 AM  Depression screen PHQ 2/9  Decreased Interest 0 0 0 0 1  Down, Depressed, Hopeless 0 0  0 0  PHQ - 2 Score 0 0 0 0 1  Altered sleeping 0    3  Tired, decreased energy 3    1  Change in appetite 0    0  Feeling bad or failure about yourself  0    0  Trouble concentrating 0    0  Moving slowly or fidgety/restless 0    0  Suicidal thoughts 0    0  PHQ-9 Score 3    5    Allergies  Allergen Reactions   Ivp Dye [Iodinated Contrast Media] Hives and Shortness Of Breath    Did ok in OP setting with 13 hr Premedications    Latanoprost    Lisinopril Cough   Onion Rash    Rash and GI upset   Sulfa Antibiotics Hives   Social History   Social History Narrative   Marital status/children/pets: Married.    Education/employment: Employed as an Print production planneroffice manager, high school Buyer, retailgraduate.   Safety:      -smoke alarm in the home:Yes     - wears seatbelt: Yes     - Feels safe in their relationships: Yes   Past Medical History:  Diagnosis Date   Angio-edema    Chest discomfort    , Atypical   Chicken pox    COVID-19 05/09/2019   Diabetic acidosis, type II (HCC)    Diverticulitis - large vs small bowel vs both?    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  "Normal "performed by Dr. Ty HiltsEdmonds  COLONOSCOPY  04/03/2019   LAPAROTOMY N/A 11/05/2013   Procedure: EXPLORATORY LAPAROTOMY;  Surgeon: Cherylynn Ridges, MD;  Location: [facility] OR;  Service: General;  Laterality: N/A;   Lexiscan  2010   Patient reports normal Lexiscan completed around 2010 by Dr. Ty Hilts.   TOTAL ABDOMINAL HYSTERECTOMY  2004   Family History  Problem Relation Age of Onset   Heart disease Mother    Heart attack Mother    Asthma Mother    Early death Mother    Miscarriages / Stillbirths Mother    Heart disease Father    Lung cancer Father    Heart attack Brother    Asthma Brother    Heart disease Brother    Kidney disease Brother    Diverticulitis Sister    Heart disease Brother    Heart attack Brother    Heart disease Brother    Heart attack Brother    Early death Maternal Grandmother    Tuberculosis Maternal Grandmother    Depression Maternal Grandfather    Heart disease Paternal Grandmother    Heart disease Sister    Colon cancer Neg Hx    Esophageal  cancer Neg Hx    Rectal cancer Neg Hx    Stomach cancer Neg Hx    Allergies as of 12/27/2021       Reactions   Ivp Dye [iodinated Contrast Media] Hives, Shortness Of Breath   Did ok in OP setting with 13 hr Premedications    Latanoprost    Lisinopril Cough   Onion Rash   Rash and GI upset   Sulfa Antibiotics Hives        Medication List        Accurate as of December 27, 2021  3:29 PM. If you have any questions, ask your nurse or doctor.          aspirin 81 MG tablet Take 81 mg by mouth at bedtime.   candesartan-hydrochlorothiazide 16-12.5 MG tablet Commonly known as: ATACAND HCT Take 1 tablet by mouth daily.   cetirizine 10 MG tablet Commonly known as: ZYRTEC Take 10 mg by mouth at bedtime.   citalopram 20 MG tablet Commonly known as: CELEXA Take 1 tablet (20 mg total) by mouth daily.   Ferrex 150 150 MG capsule Generic drug: iron polysaccharides Take 1 capsule (150 mg total) by mouth daily.   fluocinonide ointment 0.05 % Commonly known as: LIDEX Apply 1 Application topically 2 (two) times daily.   linagliptin 5 MG Tabs tablet Commonly known as: TRADJENTA Take 1 tablet (5 mg total) by mouth daily.   metFORMIN 750 MG 24 hr tablet Commonly known as: GLUCOPHAGE-XR Take 1 tablet (750 mg total) by mouth daily with breakfast.   metoprolol succinate 25 MG 24 hr tablet Commonly known as: TOPROL-XL Take 1 tablet (25 mg total) by mouth daily. Take with or immediately following a meal.   nitrofurantoin (macrocrystal-monohydrate) 100 MG capsule Commonly known as: Macrobid Take 1 capsule (100 mg total) by mouth 2 (two) times daily. What changed: Another medication with the same name was added. Make sure you understand how and when to take each. Changed by: Felix Pacini, DO   nitrofurantoin (macrocrystal-monohydrate) 100 MG capsule Commonly known as: Macrobid Take 1 capsule (100 mg total) by mouth at bedtime. What changed: You were already taking a  medication with the same name, and this prescription was added. Make sure you understand how and when to take each. Changed by: Felix Pacini, DO   omeprazole  20 MG capsule Commonly known as: PRILOSEC Take 1 capsule (20 mg total) by mouth daily.   OneTouch Delica Plus [id number] Misc check blood sugar TWICE DAILY   OneTouch Ultra test strip Generic drug: glucose blood test sugars TWICE DAILY as directed   rosuvastatin 40 MG tablet Commonly known as: CRESTOR Take 1 tablet (40 mg total) by mouth at bedtime.   timolol 0.25 % ophthalmic solution Commonly known as: TIMOPTIC Place 1 drop into the left eye 2 (two) times daily.   Travoprost (BAK Free) 0.004 % Soln ophthalmic solution Commonly known as: TRAVATAN Place 1 drop into both eyes at bedtime.   vitamin C 1000 MG tablet Take 1,000 mg by mouth daily.        All past medical history, surgical history, allergies, family history, immunizations andmedications were updated in the EMR today and reviewed under the history and medication portions of their EMR.     Review of Systems  Constitutional:  Negative for chills, fever and malaise/fatigue.  Genitourinary:  Positive for dysuria, frequency and urgency. Negative for flank pain and hematuria.  Musculoskeletal:  Positive for back pain.  Skin:  Negative for rash.   Negative, with the exception of above mentioned in HPI   Objective:  BP 114/70   Pulse 63   Temp 97.7 F (36.5 C) (Oral)   Ht 5\' 3"  (1.6 m)   Wt 193 lb (87.5 kg)   SpO2 98%   BMI 34.19 kg/m  Body mass index is 34.19 kg/m. Physical Exam Vitals and nursing note reviewed.  Constitutional:      General: She is not in acute distress.    Appearance: Normal appearance. She is normal weight. She is not ill-appearing or toxic-appearing.  HENT:     Mouth/Throat:     Mouth: Mucous membranes are moist.  Eyes:     Extraocular Movements: Extraocular movements intact.     Conjunctiva/sclera: Conjunctivae normal.      Pupils: Pupils are equal, round, and reactive to light.  Cardiovascular:     Rate and Rhythm: Normal rate and regular rhythm.  Abdominal:     Tenderness: There is abdominal tenderness (suprapubic). There is no right CVA tenderness or left CVA tenderness.  Musculoskeletal:     Right lower leg: No edema.     Left lower leg: No edema.  Skin:    Findings: No rash.  Neurological:     Mental Status: She is alert and oriented to person, place, and time. Mental status is at baseline.  Psychiatric:        Mood and Affect: Mood normal.        Behavior: Behavior normal.        Thought Content: Thought content normal.        Judgment: Judgment normal.     No results found. No results found. Results for orders placed or performed in visit on 12/27/21 (from the past 24 hour(s))  POCT Urinalysis Dipstick     Status: Normal   Collection Time: 12/27/21  3:18 PM  Result Value Ref Range   Color, UA yellow    Clarity, UA cloudy    Glucose, UA Negative Negative   Bilirubin, UA neg    Ketones, UA neg    Spec Grav, UA 1.025 1.010 - 1.025   Blood, UA neg    pH, UA 6.0 5.0 - 8.0   Protein, UA Negative Negative   Urobilinogen, UA 0.2 0.2 or 1.0 E.U./dL   Nitrite, UA neg  Leukocytes, UA Negative Negative   Appearance     Odor pos      Assessment/Plan: Carol [name] is a 66 y.o. female present for OV for   Dysuria/Abnormal urine - uro if positive culture again for recurrent UTI - proph abx macrobid prescribed to start if culture is positive today.  - tx with 5d macrobid BID while waiting on culture.  Rest, hydrate.  F/u prn   Reviewed expectations re: course of current medical issues. Discussed self-management of symptoms. Outlined signs and symptoms indicating need for more acute intervention. Patient verbalized understanding and all questions were answered. Patient received an After-Visit Summary.    Orders Placed This Encounter  Procedures   Urinalysis w microscopic + reflex  cultur   POCT Urinalysis Dipstick   Meds ordered this encounter  Medications   nitrofurantoin, macrocrystal-monohydrate, (MACROBID) 100 MG capsule    Sig: Take 1 capsule (100 mg total) by mouth 2 (two) times daily.    Dispense:  10 capsule    Refill:  0   nitrofurantoin, macrocrystal-monohydrate, (MACROBID) 100 MG capsule    Sig: Take 1 capsule (100 mg total) by mouth at bedtime.    Dispense:  90 capsule    Refill:  1   glucose blood (ONETOUCH ULTRA) test strip    Sig: test sugars TWICE DAILY as directed    Dispense:  50 strip    Refill:  6   Lancets (ONETOUCH DELICA PLUS LANCET33G) MISC    Sig: check blood sugar TWICE DAILY    Dispense:  100 each    Refill:  11   Referral Orders  No referral(s) requested today     Note is dictated utilizing voice recognition software. Although note has been proof read prior to signing, occasional typographical errors still can be missed. If any questions arise, please do not hesitate to call for verification.   electronically signed by:  Felix Pacini, DO  [facility] - OR

## 2021-12-30 LAB — URINALYSIS W MICROSCOPIC + REFLEX CULTURE
Bilirubin Urine: NEGATIVE
Glucose, UA: NEGATIVE
Hgb urine dipstick: NEGATIVE
Hyaline Cast: NONE SEEN /LPF
Ketones, ur: NEGATIVE
Nitrites, Initial: NEGATIVE
Protein, ur: NEGATIVE
RBC / HPF: NONE SEEN /HPF (ref 0–2)
Specific Gravity, Urine: 1.017 (ref 1.001–1.035)
pH: <=5 (ref 5.0–8.0)

## 2021-12-30 LAB — URINE CULTURE
MICRO NUMBER:: 13936880
SPECIMEN QUALITY:: ADEQUATE

## 2021-12-30 LAB — CULTURE INDICATED

## 2021-12-31 ENCOUNTER — Telehealth: Payer: Self-pay

## 2021-12-31 DIAGNOSIS — N390 Urinary tract infection, site not specified: Secondary | ICD-10-CM

## 2021-12-31 NOTE — Telephone Encounter (Signed)
-----   Message from Ma Hillock, DO sent at 12/31/2021  8:34 AM EDT ----- UTI positive with same bacteria as before.  Take abx as prescribed and then start the once nightly script to prevent infection.   Staff:Please place referral to urology for recurrent infections.

## 2022-01-04 ENCOUNTER — Other Ambulatory Visit: Payer: Self-pay | Admitting: Family Medicine

## 2022-01-10 ENCOUNTER — Encounter: Payer: Self-pay | Admitting: Family Medicine

## 2022-01-10 ENCOUNTER — Ambulatory Visit (INDEPENDENT_AMBULATORY_CARE_PROVIDER_SITE_OTHER): Payer: Medicare Other | Admitting: Family Medicine

## 2022-01-10 VITALS — BP 109/65 | HR 51 | Temp 98.4°F | Ht 63.5 in | Wt 193.4 lb

## 2022-01-10 DIAGNOSIS — K219 Gastro-esophageal reflux disease without esophagitis: Secondary | ICD-10-CM

## 2022-01-10 DIAGNOSIS — N951 Menopausal and female climacteric states: Secondary | ICD-10-CM

## 2022-01-10 DIAGNOSIS — E669 Obesity, unspecified: Secondary | ICD-10-CM | POA: Diagnosis not present

## 2022-01-10 DIAGNOSIS — E611 Iron deficiency: Secondary | ICD-10-CM | POA: Diagnosis not present

## 2022-01-10 DIAGNOSIS — I10 Essential (primary) hypertension: Secondary | ICD-10-CM

## 2022-01-10 DIAGNOSIS — D508 Other iron deficiency anemias: Secondary | ICD-10-CM | POA: Diagnosis not present

## 2022-01-10 DIAGNOSIS — E785 Hyperlipidemia, unspecified: Secondary | ICD-10-CM | POA: Diagnosis not present

## 2022-01-10 DIAGNOSIS — N390 Urinary tract infection, site not specified: Secondary | ICD-10-CM | POA: Diagnosis not present

## 2022-01-10 DIAGNOSIS — E1169 Type 2 diabetes mellitus with other specified complication: Secondary | ICD-10-CM

## 2022-01-10 LAB — TSH: TSH: 1.45 u[IU]/mL (ref 0.35–5.50)

## 2022-01-10 LAB — LIPID PANEL
Cholesterol: 136 mg/dL (ref 0–200)
HDL: 34.6 mg/dL — ABNORMAL LOW (ref >39.00)
LDL Cholesterol: 63 mg/dL (ref 0–99)
NonHDL: 101.69
Total CHOL/HDL Ratio: 4
Triglycerides: 192 mg/dL — ABNORMAL HIGH (ref 0.0–149.0)
VLDL: 38.4 mg/dL (ref 0.0–40.0)

## 2022-01-10 LAB — HEMOGLOBIN A1C: Hgb A1c MFr Bld: 6.7 % — ABNORMAL HIGH (ref 4.6–6.5)

## 2022-01-10 LAB — CBC
HCT: 37.5 % (ref 36.0–46.0)
Hemoglobin: 12.2 g/dL (ref 12.0–15.0)
MCHC: 32.4 g/dL (ref 30.0–36.0)
MCV: 84.2 fl (ref 78.0–100.0)
Platelets: 377 10*3/uL (ref 150.0–400.0)
RBC: 4.46 Mil/uL (ref 3.87–5.11)
RDW: 15.8 % — ABNORMAL HIGH (ref 11.5–15.5)
WBC: 9.4 10*3/uL (ref 4.0–10.5)

## 2022-01-10 LAB — COMPREHENSIVE METABOLIC PANEL WITH GFR
ALT: 21 U/L (ref 0–35)
AST: 18 U/L (ref 0–37)
Albumin: 4.3 g/dL (ref 3.5–5.2)
Alkaline Phosphatase: 80 U/L (ref 39–117)
BUN: 24 mg/dL — ABNORMAL HIGH (ref 6–23)
CO2: 27 meq/L (ref 19–32)
Calcium: 10 mg/dL (ref 8.4–10.5)
Chloride: 102 meq/L (ref 96–112)
Creatinine, Ser: 0.68 mg/dL (ref 0.40–1.20)
GFR: 90.46 mL/min
Glucose, Bld: 114 mg/dL — ABNORMAL HIGH (ref 70–99)
Potassium: 4.3 meq/L (ref 3.5–5.1)
Sodium: 138 meq/L (ref 135–145)
Total Bilirubin: 0.4 mg/dL (ref 0.2–1.2)
Total Protein: 7.4 g/dL (ref 6.0–8.3)

## 2022-01-10 LAB — MICROALBUMIN / CREATININE URINE RATIO
Creatinine,U: 33.1 mg/dL
Microalb Creat Ratio: 2.1 mg/g (ref 0.0–30.0)
Microalb, Ur: <0.7 mg/dL (ref 0.0–1.9)

## 2022-01-10 MED ORDER — METFORMIN HCL ER 750 MG PO TB24: 750.0000 mg | ORAL_TABLET | Freq: Every day | ORAL | 1 refills | Status: DC

## 2022-01-10 MED ORDER — CITALOPRAM HYDROBROMIDE 20 MG PO TABS: 20.0000 mg | ORAL_TABLET | Freq: Every day | ORAL | 1 refills | Status: DC

## 2022-01-10 MED ORDER — OMEPRAZOLE 20 MG PO CPDR: 20.0000 mg | DELAYED_RELEASE_CAPSULE | Freq: Every day | ORAL | 1 refills | Status: DC

## 2022-01-10 MED ORDER — ROSUVASTATIN CALCIUM 40 MG PO TABS: 40.0000 mg | ORAL_TABLET | Freq: Every day | ORAL | 3 refills | Status: DC

## 2022-01-10 MED ORDER — METOPROLOL SUCCINATE ER 25 MG PO TB24: 25.0000 mg | ORAL_TABLET | Freq: Every day | ORAL | 1 refills | Status: DC

## 2022-01-10 MED ORDER — CANDESARTAN CILEXETIL-HCTZ 16-12.5 MG PO TABS: 1.0000 | ORAL_TABLET | Freq: Every day | ORAL | 1 refills | Status: DC

## 2022-01-10 MED ORDER — FERREX 150 150 MG PO CAPS: 150.0000 mg | ORAL_CAPSULE | Freq: Every day | ORAL | 3 refills | Status: DC

## 2022-01-10 NOTE — Progress Notes (Signed)
Patient ID: Carol [name], female  DOB: [birth year], 66 y.o.   MRN: [id number] Patient Care Team    Relationship Specialty Notifications Start End  Ma Hillock, DO PCP - General Family Medicine  06/10/19   Jerline Pain, MD Consulting Physician Cardiology  06/12/19   Gatha Mayer, MD Consulting Physician Gastroenterology  06/12/19   Danice Goltz, MD Consulting Physician Ophthalmology  06/12/19   Sonda Primes, MD Referring Physician Obstetrics and Gynecology  06/12/19   Jiles Prows, MD Consulting Physician Allergy and Immunology  06/12/19   Specialists, [facility]  07/26/21     Chief Complaint  Patient presents with   Annual Exam    Pt is fasting    Subjective: Carol [name] is a 66 y.o.  Female  present for [facility]. All past medical history, surgical history, allergies, family history, immunizations, medications and social history were updated in the electronic medical record today. All recent labs, ED visits and hospitalizations within the last year were reviewed.  Type 2 diabetes mellitus without complication, without long-term current use of insulin (HCC)/obesity Pt reports compliance with metformin 750 mg daily and tradjenta 5 mg QD. Patient denies dizziness, hyperglycemic or hypoglycemic events. Patient denies numbness, tingling in the extremities or nonhealing wounds of feet.    Essential hypertension/HTN Pt reports compliance with Toprol-XL 25, candesartan-HCTZ 16-12.5 mg daily, Crestor 40 mg daily and baby aspirin. Patient denies chest pain, shortness of breath, dizziness or lower extremity edema.  RF: Hypertension, hyperlipidemia, diabetic, obesity, family history.    Gastroesophageal reflux disease without esophagitis Patient reports symptoms are well controlled on  her omeprazole 20 mg daily.  She has been unable to discontinue medication in the past without recurrence of symptoms immediately.   Hot flashes: Patient reports celexa 20 mg  qd is being a great deal  Iron deficiency anemia: Continues the iron supplement every other day.  Labs are due today.  Hypercalcemia: Patient was found to have very mild hypercalcemia of 10.5, 10.4 is normal.  She was encouraged to hydrate and avoid any added calcium.Marland Kitchen  Her PTH was normal.  Repeat have been stable  Recurrent UTI: Patient has been suffering from recurrent UTIs.  She recently got over a UTI and has been taking Macrobid.  We started Macrobid nightly for prophylaxis.  Urology referral had been placed 12/31/2021-she has not heard from them as of yet.     01/10/2022    8:25 AM 07/26/2021    8:09 AM 07/06/2021    1:43 PM 02/02/2021    1:27 PM 11/18/2020    1:04 PM  Depression screen PHQ 2/9  Decreased Interest 0 0 0 0 0  Down, Depressed, Hopeless 0 0 0  0  PHQ - 2 Score 0 0 0 0 0  Altered sleeping  0     Tired, decreased energy  3     Change in appetite  0     Feeling bad or failure about yourself   0     Trouble concentrating  0     Moving slowly or fidgety/restless  0     Suicidal thoughts  0     PHQ-9 Score  3         07/26/2021    8:11 AM 06/29/2020   10:41 AM  GAD 7 : Generalized Anxiety Score  Nervous, Anxious, on Edge 0 1  Control/stop worrying 0 0  Worry too much - different  things 0 0  Trouble relaxing 0 0  Restless 0 0  Easily annoyed or irritable 0 0  Afraid - awful might happen 0 0  Total GAD 7 Score 0 1    Immunization History  Administered Date(s) Administered   Fluad Quad(high Dose 65+) 02/21/2020, 01/06/2021   Influenza,inj,Quad PF,6+ Mos 03/30/2019   PFIZER(Purple Top)SARS-COV-2 Vaccination 07/02/2019, 07/30/2019, 01/19/2020   Pfizer Covid-19 Vaccine Bivalent Booster 58yr & up 02/12/2021   Pneumococcal Conjugate-13 03/11/2020   Pneumococcal Polysaccharide-23 03/24/2021   Tdap 08/27/2012   Zoster Recombinat (Shingrix) 11/05/2019, 02/03/2020   Past Medical History:  Diagnosis Date   Angio-edema    Chest discomfort    , Atypical   Chicken  pox    COVID-19 05/09/2019   Diabetic acidosis, type II (HCC)    Diverticulitis - large vs small bowel vs both?    Gastroesophageal reflux disease    Glaucoma    Hyperlipidemia    Hypertension    PVC (premature ventricular contraction)    Systolic dysfunction    , Hyperdynamic   Allergies  Allergen Reactions   Ivp Dye [Iodinated Contrast Media] Hives and Shortness Of Breath    Did ok in OP setting with 13 hr Premedications    Latanoprost    Lisinopril Cough   Onion Rash    Rash and GI upset   Sulfa Antibiotics Hives   Past Surgical History:  Procedure Laterality Date   APPENDECTOMY N/A 11/05/2013   Procedure: APPENDECTOMY;  Surgeon: JGwenyth Ober MD;  Location[facility]  Service: General;  Laterality: N/A;   BOWEL RESECTION N/A 11/05/2013   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  "Normal "performed by Dr. EIlda Foil  COLONOSCOPY  04/03/2019   LAPAROTOMY N/A 11/05/2013   Procedure: EXPLORATORY LAPAROTOMY;  Surgeon: JGwenyth Ober MD;  Location: [facility]  Service: General;  Laterality: N/A;   Lexiscan  2010   Patient reports normal Lexiscan completed around 2010 by Dr. EIlda Foil   TOTAL ABDOMINAL HYSTERECTOMY  2004   Family History  Problem Relation Age of Onset   Heart disease Mother    Heart attack Mother    Asthma Mother    Early death Mother    Miscarriages / Stillbirths Mother    Heart disease Father    Lung cancer Father    Heart attack Brother    Asthma Brother    Heart disease Brother    Kidney disease Brother    Diverticulitis Sister    Heart disease Brother    Heart attack Brother    Heart disease Brother    Heart attack Brother    Early death Maternal Grandmother    Tuberculosis Maternal Grandmother    Depression Maternal Grandfather    Heart disease Paternal Grandmother    Heart disease Sister    Colon cancer Neg Hx    Esophageal cancer Neg Hx    Rectal cancer Neg Hx    Stomach  cancer Neg Hx    Social History   Social History Narrative   Marital status/children/pets: Married.   Education/employment: Employed as an oGlass blower/designer high school gWriter   Safety:      -smoke alarm in the home:Yes     - wears seatbelt: Yes     - Feels safe in their relationships: Yes    Allergies as of 01/10/2022       Reactions   Ivp Dye [iodinated Contrast  Media] Hives, Shortness Of Breath   Did ok in OP setting with 13 hr Premedications    Latanoprost    Lisinopril Cough   Onion Rash   Rash and GI upset   Sulfa Antibiotics Hives        Medication List        Accurate as of January 10, 2022  9:41 AM. If you have any questions, ask your nurse or doctor.          aspirin 81 MG tablet Take 81 mg by mouth at bedtime.   candesartan-hydrochlorothiazide 16-12.5 MG tablet Commonly known as: ATACAND HCT Take 1 tablet by mouth daily.   cetirizine 10 MG tablet Commonly known as: ZYRTEC Take 10 mg by mouth at bedtime.   citalopram 20 MG tablet Commonly known as: CELEXA Take 1 tablet (20 mg total) by mouth daily.   Ferrex 150 150 MG capsule Generic drug: iron polysaccharides Take 1 capsule (150 mg total) by mouth daily.   fluocinonide ointment 0.05 % Commonly known as: LIDEX Apply 1 Application topically 2 (two) times daily.   linagliptin 5 MG Tabs tablet Commonly known as: TRADJENTA Take 1 tablet (5 mg total) by mouth daily.   metFORMIN 750 MG 24 hr tablet Commonly known as: GLUCOPHAGE-XR Take 1 tablet (750 mg total) by mouth daily with breakfast.   metoprolol succinate 25 MG 24 hr tablet Commonly known as: TOPROL-XL Take 1 tablet (25 mg total) by mouth daily. Take with or immediately following a meal.   nitrofurantoin (macrocrystal-monohydrate) 100 MG capsule Commonly known as: Macrobid Take 1 capsule (100 mg total) by mouth at bedtime. What changed: Another medication with the same name was removed. Continue taking this medication, and follow  the directions you see here. Changed by: Howard Pouch, DO   omeprazole 20 MG capsule Commonly known as: PRILOSEC Take 1 capsule (20 mg total) by mouth daily.   OneTouch Delica Plus [id number] Misc check blood sugar TWICE DAILY   OneTouch Ultra test strip Generic drug: glucose blood test sugars TWICE DAILY as directed   PROBIOTIC BLEND PO Take 2 capsules by mouth daily.   rosuvastatin 40 MG tablet Commonly known as: CRESTOR Take 1 tablet (40 mg total) by mouth at bedtime.   timolol 0.25 % ophthalmic solution Commonly known as: TIMOPTIC Place 1 drop into the left eye 2 (two) times daily.   Travoprost (BAK Free) 0.004 % Soln ophthalmic solution Commonly known as: TRAVATAN Place 1 drop into both eyes at bedtime.   vitamin C 1000 MG tablet Take 1,000 mg by mouth daily.        All past medical history, surgical history, allergies, family history, immunizations andmedications were updated in the EMR today and reviewed under the history and medication portions of their EMR.       ROS: 14 pt review of systems performed and negative (unless mentioned in an HPI)  Objective: BP 109/65   Pulse (!) 51   Temp 98.4 F (36.9 C)   Ht 5' 3.5" (1.613 m)   Wt 193 lb 6.4 oz (87.7 kg)   SpO2 97%   BMI 33.72 kg/m  Physical Exam Vitals and nursing note reviewed.  Constitutional:      General: She is not in acute distress.    Appearance: Normal appearance. She is obese. She is not ill-appearing, toxic-appearing or diaphoretic.  HENT:     Head: Normocephalic and atraumatic.  Eyes:     General: No scleral icterus.  Right eye: No discharge.        Left eye: No discharge.     Extraocular Movements: Extraocular movements intact.     Conjunctiva/sclera: Conjunctivae normal.     Pupils: Pupils are equal, round, and reactive to light.  Neck:     Thyroid: No thyromegaly.  Cardiovascular:     Rate and Rhythm: Normal rate and regular rhythm.     Heart sounds: Murmur heard.   Pulmonary:     Effort: Pulmonary effort is normal. No respiratory distress.     Breath sounds: Normal breath sounds. No wheezing, rhonchi or rales.  Musculoskeletal:     Cervical back: Neck supple. No tenderness.     Right lower leg: No edema.     Left lower leg: No edema.  Lymphadenopathy:     Cervical: No cervical adenopathy.  Skin:    General: Skin is warm and dry.     Coloration: Skin is not jaundiced or pale.     Findings: No erythema or rash.  Neurological:     Mental Status: She is alert and oriented to person, place, and time. Mental status is at baseline.     Motor: No weakness.     Gait: Gait normal.  Psychiatric:        Mood and Affect: Mood normal.        Behavior: Behavior normal.        Thought Content: Thought content normal.        Judgment: Judgment normal.      No results found.  Assessment/plan: Carol [name] is a 66 y.o. female present for [facility] Type 2 diabetes mellitus with hyperlipidemia (HCC)/morbid obesity Been stable Continue metformin 750 mg daily Continue tradjenta 5 mg -refill after lab results and appropriate dose. Continue Crestor 40 mg daily Continue monitoring blood glucose daily PNA series: prevnar 13 completed 03/2020> PNA23 provided today Flu shot: Up-to-date 2021 (recommneded yearly) Foot exam:  Completed 07/26/2021 Eye exam: Up-to-date  5//2022 Dr. Manuella Ghazi records requested A1c: 6.9> 6.1>7.2>5.8 > 6.4 >6.3 >6.2> collected  A1c today Urine microalbumin collected today -Follow-up every 4-6 months   Essential hypertension/HLD/obesity Stable Continue metoprolol XL at a decreased dose of half tab daily (25 mg dose) Continue candesartan/HCTZ 16-12.5 mg Continue Crestor 40 mg daily Continue baby aspirin daily CBC, CMP, TSH and lipids collected today Follow-up every 6 months with chronic conditions   Gastroesophageal reflux disease without esophagitis Stable Continue omeprazole 20 mg daily   Hot flashes: Stable Continue Celexa 20 mg  daily  iron deficiency anemia Currently supplementing iron polysaccharide Iron panel collected today  Hypercalcemia She is continuing to hydrate.  CMP collected today  Recurrent UTI: Culture collected today We will check on urology referral for her Continue Macrobid nightly  Return in about 24 weeks (around 06/27/2022) for Routine chronic condition follow-up.   Orders Placed This Encounter  Procedures   Urine Culture   Comp Met (CMET)   Lipid panel   TSH   Microalbumin / creatinine urine ratio   CBC   Hemoglobin A1c   Iron, TIBC and Ferritin Panel    Meds ordered this encounter  Medications   candesartan-hydrochlorothiazide (ATACAND HCT) 16-12.5 MG tablet    Sig: Take 1 tablet by mouth daily.    Dispense:  90 tablet    Refill:  1   citalopram (CELEXA) 20 MG tablet    Sig: Take 1 tablet (20 mg total) by mouth daily.    Dispense:  90 tablet  Refill:  1   FERREX 150 150 MG capsule    Sig: Take 1 capsule (150 mg total) by mouth daily.    Dispense:  90 capsule    Refill:  3   metFORMIN (GLUCOPHAGE-XR) 750 MG 24 hr tablet    Sig: Take 1 tablet (750 mg total) by mouth daily with breakfast.    Dispense:  90 tablet    Refill:  1   metoprolol succinate (TOPROL-XL) 25 MG 24 hr tablet    Sig: Take 1 tablet (25 mg total) by mouth daily. Take with or immediately following a meal.    Dispense:  90 tablet    Refill:  1   omeprazole (PRILOSEC) 20 MG capsule    Sig: Take 1 capsule (20 mg total) by mouth daily.    Dispense:  90 capsule    Refill:  1   rosuvastatin (CRESTOR) 40 MG tablet    Sig: Take 1 tablet (40 mg total) by mouth at bedtime.    Dispense:  90 tablet    Refill:  3    Referral Orders  No referral(s) requested today     Electronically signed by: Howard Pouch, [place]

## 2022-01-10 NOTE — Patient Instructions (Addendum)
Return in about 24 weeks (around 06/27/2022) for Routine chronic condition follow-up.        Great to see you today.  I have refilled the medication(s) we provide.   If labs were collected, we will inform you of lab results once received either by echart message or telephone call.   - echart message- for normal results that have been seen by the patient already.   - telephone call: abnormal results or if patient has not viewed results in their echart.  

## 2022-01-11 LAB — IRON,TIBC AND FERRITIN PANEL
%SAT: 17 % (calc) (ref 16–45)
Ferritin: 31 ng/mL (ref 16–288)
Iron: 67 ug/dL (ref 45–160)
TIBC: 387 mcg/dL (calc) (ref 250–450)

## 2022-01-12 ENCOUNTER — Telehealth: Payer: Self-pay | Admitting: Family Medicine

## 2022-01-12 LAB — URINE CULTURE
MICRO NUMBER:: 13998431
SPECIMEN QUALITY:: ADEQUATE

## 2022-01-12 NOTE — Telephone Encounter (Signed)
Pt advised of results. Contact info given for [facility].

## 2022-01-12 NOTE — Telephone Encounter (Signed)
Great news- urine culture has no growth! Continue the macrobid qhs

## 2022-02-02 DIAGNOSIS — H401131 Primary open-angle glaucoma, bilateral, mild stage: Secondary | ICD-10-CM | POA: Diagnosis not present

## 2022-02-02 LAB — HM DIABETES EYE EXAM

## 2022-02-07 DIAGNOSIS — N3020 Other chronic cystitis without hematuria: Secondary | ICD-10-CM | POA: Diagnosis not present

## 2022-02-07 DIAGNOSIS — R3982 Chronic bladder pain: Secondary | ICD-10-CM | POA: Diagnosis not present

## 2022-02-23 DIAGNOSIS — N3020 Other chronic cystitis without hematuria: Secondary | ICD-10-CM | POA: Diagnosis not present

## 2022-02-23 DIAGNOSIS — R3982 Chronic bladder pain: Secondary | ICD-10-CM | POA: Diagnosis not present

## 2022-02-23 DIAGNOSIS — R278 Other lack of coordination: Secondary | ICD-10-CM | POA: Diagnosis not present

## 2022-03-21 ENCOUNTER — Other Ambulatory Visit: Payer: Self-pay

## 2022-03-21 ENCOUNTER — Ambulatory Visit (INDEPENDENT_AMBULATORY_CARE_PROVIDER_SITE_OTHER): Payer: Medicare Other | Admitting: Family Medicine

## 2022-03-21 ENCOUNTER — Emergency Department (HOSPITAL_COMMUNITY)
Admission: EM | Admit: 2022-03-21 | Discharge: 2022-03-22 | Disposition: A | Discharge status: Home / Self Care | Payer: Medicare Other | Attending: Emergency Medicine | Admitting: Emergency Medicine

## 2022-03-21 ENCOUNTER — Encounter: Payer: Self-pay | Admitting: Family Medicine

## 2022-03-21 ENCOUNTER — Encounter (HOSPITAL_COMMUNITY): Payer: Self-pay

## 2022-03-21 VITALS — BP 108/67 | HR 71 | Temp 98.4°F | Ht 63.5 in | Wt 195.0 lb

## 2022-03-21 DIAGNOSIS — Z79899 Other long term (current) drug therapy: Secondary | ICD-10-CM | POA: Diagnosis not present

## 2022-03-21 DIAGNOSIS — Z7982 Long term (current) use of aspirin: Secondary | ICD-10-CM | POA: Insufficient documentation

## 2022-03-21 DIAGNOSIS — R100 Acute abdomen: Secondary | ICD-10-CM

## 2022-03-21 DIAGNOSIS — K219 Gastro-esophageal reflux disease without esophagitis: Secondary | ICD-10-CM | POA: Insufficient documentation

## 2022-03-21 DIAGNOSIS — R1012 Left upper quadrant pain: Secondary | ICD-10-CM

## 2022-03-21 DIAGNOSIS — R1032 Left lower quadrant pain: Secondary | ICD-10-CM | POA: Diagnosis present

## 2022-03-21 DIAGNOSIS — K8020 Calculus of gallbladder without cholecystitis without obstruction: Secondary | ICD-10-CM | POA: Diagnosis not present

## 2022-03-21 LAB — CBC WITH DIFFERENTIAL/PLATELET
Abs Immature Granulocytes: 0.06 10*3/uL (ref 0.00–0.07)
Basophils Absolute: 0.1 10*3/uL (ref 0.0–0.1)
Basophils Relative: 1 %
Eosinophils Absolute: 0.3 10*3/uL (ref 0.0–0.5)
Eosinophils Relative: 3 %
HCT: 39.5 % (ref 36.0–46.0)
Hemoglobin: 12.7 g/dL (ref 12.0–15.0)
Immature Granulocytes: 0 %
Lymphocytes Relative: 29 %
Lymphs Abs: 3.9 10*3/uL (ref 0.7–4.0)
MCH: 27.3 pg (ref 26.0–34.0)
MCHC: 32.2 g/dL (ref 30.0–36.0)
MCV: 84.8 fL (ref 80.0–100.0)
Monocytes Absolute: 0.9 10*3/uL (ref 0.1–1.0)
Monocytes Relative: 6 %
Neutro Abs: 8.2 10*3/uL — ABNORMAL HIGH (ref 1.7–7.7)
Neutrophils Relative %: 61 %
Platelets: 436 10*3/uL — ABNORMAL HIGH (ref 150–400)
RBC: 4.66 MIL/uL (ref 3.87–5.11)
RDW: 14.5 % (ref 11.5–15.5)
WBC: 13.4 10*3/uL — ABNORMAL HIGH (ref 4.0–10.5)
nRBC: 0 % (ref 0.0–0.2)

## 2022-03-21 LAB — COMPREHENSIVE METABOLIC PANEL
ALT: 21 U/L (ref 0–44)
AST: 24 U/L (ref 15–41)
Albumin: 3.9 g/dL (ref 3.5–5.0)
Alkaline Phosphatase: 73 U/L (ref 38–126)
Anion gap: 10 (ref 5–15)
BUN: 15 mg/dL (ref 8–23)
CO2: 26 mmol/L (ref 22–32)
Calcium: 10.1 mg/dL (ref 8.9–10.3)
Chloride: 101 mmol/L (ref 98–111)
Creatinine, Ser: 0.8 mg/dL (ref 0.44–1.00)
GFR, Estimated: >60 mL/min (ref >60)
Glucose, Bld: 115 mg/dL — ABNORMAL HIGH (ref 70–99)
Potassium: 3.9 mmol/L (ref 3.5–5.1)
Sodium: 137 mmol/L (ref 135–145)
Total Bilirubin: 0.4 mg/dL (ref 0.3–1.2)
Total Protein: 8.1 g/dL (ref 6.5–8.1)

## 2022-03-21 LAB — D-DIMER, QUANTITATIVE: D-Dimer, Quant: 0.27 ug/mL-FEU (ref 0.00–0.50)

## 2022-03-21 MED ORDER — DIPHENHYDRAMINE HCL 50 MG/ML IJ SOLN
50.0000 mg | Freq: Once | INTRAMUSCULAR | Status: DC
  Filled 2022-03-21: qty 1

## 2022-03-21 MED ORDER — DIPHENHYDRAMINE HCL 25 MG PO CAPS
50.0000 mg | ORAL_CAPSULE | Freq: Once | ORAL | Status: DC
  Filled 2022-03-21: qty 2

## 2022-03-21 MED ORDER — PREDNISONE 5 MG PO TABS
50.0000 mg | ORAL_TABLET | Freq: Four times a day (QID) | ORAL | Status: DC
  Administered 2022-03-21 (×2): 50 mg via ORAL
  Filled 2022-03-21 (×2): qty 2
  Filled 2022-03-21: qty 3

## 2022-03-21 NOTE — Progress Notes (Signed)
Carol [name] , 1955/12/08, 66 y.o., female MRN: [id number] Patient Care Team    Relationship Specialty Notifications Start End  Natalia Leatherwood, DO PCP - General Family Medicine  06/10/19   Jake Bathe, MD Consulting Physician Cardiology  06/12/19   Iva Boop, MD Consulting Physician Gastroenterology  06/12/19   Elwin Mocha, MD Consulting Physician Ophthalmology  06/12/19   Frankey Poot, MD Referring Physician Obstetrics and Gynecology  06/12/19   Jessica Priest, MD Consulting Physician Allergy and Immunology  06/12/19   Specialists, [facility]  07/26/21     Chief Complaint  Patient presents with   Wrist Pain    Pt c/o L wrist and thumb pain x 2 weeks   Abdominal Pain    Pt c/o LUQ abd pain described as severe sharp pain x 2 days     Subjective: Pt presents for an OV with complaints of abd pain - started Saturday.  No nause, vomit or diarrhea. No fever. Severe pain that is constant. Worse with any movement. H/o bowel resection and diverticulitis.      01/10/2022    8:25 AM 07/26/2021    8:09 AM 07/06/2021    1:43 PM 02/02/2021    1:27 PM 11/18/2020    1:04 PM  Depression screen PHQ 2/9  Decreased Interest 0 0 0 0 0  Down, Depressed, Hopeless 0 0 0  0  PHQ - 2 Score 0 0 0 0 0  Altered sleeping  0     Tired, decreased energy  3     Change in appetite  0     Feeling bad or failure about yourself   0     Trouble concentrating  0     Moving slowly or fidgety/restless  0     Suicidal thoughts  0     PHQ-9 Score  3       Allergies  Allergen Reactions   Ivp Dye [Iodinated Contrast Media] Hives and Shortness Of Breath    Did ok in OP setting with 13 hr Premedications    Latanoprost    Lisinopril Cough   Onion Rash    Rash and GI upset   Sulfa Antibiotics Hives   Social History   Social History Narrative   Marital status/children/pets: Married.   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    Safety:      -smoke alarm in the home:Yes     - wears seatbelt: Yes     - Feels safe in their relationships: Yes   Past Medical History:  Diagnosis Date   Angio-edema    Chest discomfort    , Atypical   Chicken pox    COVID-19 05/09/2019   Diabetic acidosis, type II (HCC)    Diverticulitis - large vs small bowel vs both?    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  "Normal "performed by Dr. Ty Hilts  COLONOSCOPY  04/03/2019   LAPAROTOMY N/A 11/05/2013   Procedure: EXPLORATORY LAPAROTOMY;  Surgeon: Cherylynn Ridges, MD;  Location: [facility] OR;  Service: General;  Laterality: N/A;   Lexiscan  2010   Patient reports normal Lexiscan completed around 2010 by Dr. Ty Hilts.   TOTAL ABDOMINAL HYSTERECTOMY  2004   Family History  Problem Relation Age of Onset   Heart disease Mother    Heart attack Mother    Asthma Mother    Early death Mother    Miscarriages / Stillbirths Mother    Heart disease Father    Lung cancer Father    Heart attack Brother    Asthma Brother    Heart disease Brother    Kidney disease Brother    Diverticulitis Sister    Heart disease Brother    Heart attack Brother    Heart disease Brother    Heart attack Brother    Early death Maternal Grandmother    Tuberculosis Maternal Grandmother    Depression Maternal Grandfather    Heart disease Paternal Grandmother    Heart disease Sister    Colon cancer Neg Hx    Esophageal cancer Neg Hx    Rectal cancer Neg Hx    Stomach cancer Neg Hx    Allergies as  of 03/21/2022       Reactions   Ivp Dye [iodinated Contrast Media] Hives, Shortness Of Breath   Did ok in OP setting with 13 hr Premedications    Latanoprost    Lisinopril Cough   Onion Rash   Rash and GI upset   Sulfa Antibiotics Hives        Medication List        Accurate as of March 21, 2022  2:23 PM. If you have any questions, ask your nurse or doctor.          aspirin 81 MG tablet Take 81 mg by mouth at bedtime.   candesartan-hydrochlorothiazide 16-12.5 MG tablet Commonly known as: ATACAND HCT Take 1 tablet by mouth daily.   cetirizine 10 MG tablet Commonly known as: ZYRTEC Take 10 mg by mouth at bedtime.   citalopram 20 MG tablet Commonly known as: CELEXA Take 1 tablet (20 mg total) by mouth daily.   Ferrex 150 150 MG capsule Generic drug: iron polysaccharides Take 1 capsule (150 mg total) by mouth daily.   fluocinonide ointment 0.05 % Commonly known as: LIDEX Apply 1 Application topically 2 (two) times daily.   linagliptin 5 MG Tabs tablet Commonly known as: TRADJENTA Take 1 tablet (5 mg total) by mouth daily.   metFORMIN 750 MG 24 hr tablet Commonly known as: GLUCOPHAGE-XR Take 1 tablet (750 mg total) by mouth daily with breakfast.   metoprolol succinate 25 MG 24 hr tablet Commonly known as: TOPROL-XL Take 1 tablet (25 mg total) by mouth daily. Take with or immediately following a meal.   nitrofurantoin (macrocrystal-monohydrate) 100 MG capsule Commonly known as: Macrobid Take 1 capsule (100 mg total) by mouth at bedtime.   omeprazole 20 MG capsule Commonly known as: PRILOSEC Take 1 capsule (20 mg total) by mouth daily.   OneTouch Delica Plus [id number] Misc check blood sugar TWICE DAILY   OneTouch Ultra test strip Generic drug: glucose blood test sugars TWICE DAILY as directed   PROBIOTIC BLEND PO Take 2 capsules by mouth daily.   rosuvastatin 40 MG tablet Commonly known as: CRESTOR Take 1 tablet (40 mg total) by mouth at  bedtime.   timolol 0.25 %  ophthalmic solution Commonly known as: TIMOPTIC Place 1 drop into the left eye 2 (two) times daily.   Travoprost (BAK Free) 0.004 % Soln ophthalmic solution Commonly known as: TRAVATAN Place 1 drop into both eyes at bedtime.   vitamin C 1000 MG tablet Take 1,000 mg by mouth daily.        All past medical history, surgical history, allergies, family history, immunizations andmedications were updated in the EMR today and reviewed under the history and medication portions of their EMR.     ROS Negative, with the exception of above mentioned in HPI   Objective:  BP 108/67   Pulse 71   Temp 98.4 F (36.9 C) (Oral)   Ht 5' 3.5" (1.613 m)   Wt 195 lb (88.5 kg)   SpO2 95%   BMI 34.00 kg/m  Body mass index is 34 kg/m. Physical Exam Vitals and nursing note reviewed.  Constitutional:      General: She is not in acute distress.    Appearance: Normal appearance. She is obese. She is not ill-appearing or toxic-appearing.     Comments: In severe pain  HENT:     Head: Normocephalic and atraumatic.  Eyes:     Extraocular Movements: Extraocular movements intact.     Conjunctiva/sclera: Conjunctivae normal.     Pupils: Pupils are equal, round, and reactive to light.  Cardiovascular:     Rate and Rhythm: Normal rate and regular rhythm.  Pulmonary:     Effort: Pulmonary effort is normal.     Breath sounds: Normal breath sounds.  Abdominal:     General: Bowel sounds are decreased.     Palpations: Abdomen is soft. There is no shifting dullness, fluid wave or hepatomegaly.     Tenderness: There is abdominal tenderness in the left upper quadrant. There is guarding. There is no right CVA tenderness or left CVA tenderness.       Comments: Severe TTP LUQ, swelling present. No skin changes  Skin:    General: Skin is warm and dry.     Coloration: Skin is not pale.     Findings: No bruising, erythema or rash.  Neurological:     Mental Status: She is alert  and oriented to person, place, and time. Mental status is at baseline.  Psychiatric:        Mood and Affect: Mood normal.        Behavior: Behavior normal.        Thought Content: Thought content normal.        Judgment: Judgment normal.      No results found. No results found. No results found for this or any previous visit (from the past 24 hour(s)).  Assessment/Plan: Carol [name] is a 66 y.o. female present for OV for  Left upper quadrant abdominal pain Acute abdomen Considered labs and CT STAT- however pts exam is concerning for an acute abd. Pt agreed to present to ED- she wanted her husband to drive her.  We will call triage nurse . Sent to ED stat- husband will drive her. She has had a bowel resection in the past> concern of adhesions/ischemia or splenic infarct with her pain level.  Extreme LUQ pain.   Reviewed expectations re: course of current medical issues. Discussed self-management of symptoms. Outlined signs and symptoms indicating need for more acute intervention. Patient verbalized understanding and all questions were answered. Patient received an After-Visit Summary.    No orders of the defined types were placed in this  encounter.  No orders of the defined types were placed in this encounter.  Referral Orders  No referral(s) requested today     Note is dictated utilizing voice recognition software. Although note has been proof read prior to signing, occasional typographical errors still can be missed. If any questions arise, please do not hesitate to call for verification.   electronically signed by:  Howard Pouch, DO  [place]

## 2022-03-21 NOTE — ED Provider Triage Note (Signed)
Emergency Medicine Provider Triage Evaluation Note  Carol [name] , a 66 y.o. female  was evaluated in triage.  Pt complains of LUQ pain that has been persistent x3 days. Denies N/V. Had last BM today. Hx of diverticulitis, perforated bowel. States pain is constant worse with deep breaths.   Review of Systems  Positive: Abdominal pain Negative: N/V  Physical Exam  There were no vitals taken for this visit. Gen:   Awake, no distress   Resp:  Normal effort  MSK:   Moves extremities without difficulty  Other:  LUQ pain w/splinting and guarding  Medical Decision Making  Medically screening exam initiated at 4:36 PM.  Appropriate orders placed.  AARIAN CLEAVER was informed that the remainder of the evaluation will be completed by another provider, this initial triage assessment does not replace that evaluation, and the importance of remaining in the ED until their evaluation is complete.     Pete Pelt, [place] 03/21/22 1637

## 2022-03-21 NOTE — Patient Instructions (Signed)
No follow-ups on file.        Great to see you today.  I have refilled the medication(s) we provide.   If labs were collected, we will inform you of lab results once received either by echart message or telephone call.   - echart message- for normal results that have been seen by the patient already.   - telephone call: abnormal results or if patient has not viewed results in their echart.  

## 2022-03-22 ENCOUNTER — Emergency Department (HOSPITAL_COMMUNITY): Payer: Medicare Other

## 2022-03-22 DIAGNOSIS — K8020 Calculus of gallbladder without cholecystitis without obstruction: Secondary | ICD-10-CM | POA: Diagnosis not present

## 2022-03-22 MED ORDER — SUCRALFATE 1 G PO TABS: 1.0000 g | ORAL_TABLET | Freq: Three times a day (TID) | ORAL | 0 refills | Status: DC

## 2022-03-22 MED ORDER — DIPHENHYDRAMINE HCL 50 MG/ML IJ SOLN
50.0000 mg | Freq: Once | INTRAMUSCULAR | Status: AC
  Administered 2022-03-22: 50 mg via INTRAVENOUS

## 2022-03-22 MED ORDER — IOHEXOL 350 MG/ML SOLN
75.0000 mL | Freq: Once | INTRAVENOUS | Status: AC | PRN
  Administered 2022-03-22: 75 mL via INTRAVENOUS

## 2022-03-22 MED ORDER — METHYLPREDNISOLONE SODIUM SUCC 40 MG IJ SOLR
40.0000 mg | Freq: Once | INTRAMUSCULAR | Status: AC
  Administered 2022-03-22: 40 mg via INTRAVENOUS
  Filled 2022-03-22: qty 1

## 2022-03-22 NOTE — Discharge Instructions (Addendum)
You were seen in the ER today for abdominal pain. Based on labs and imaging, there is no concern at this time for diverticulitis or any type of bowel perforation. Continue taking omperazole for acid reflux as prescribed and begin taking sucralfate that has been sent in to your pharmacy.   Please make sure you follow up with your primary care provider to discuss ER visit and further evaluation and treatment of symptoms.

## 2022-03-22 NOTE — ED Provider Notes (Signed)
[facility] EMERGENCY DEPARTMENT Provider Note   CSN: [id number] Arrival date & time: 03/21/22  1439     History Chief Complaint  Patient presents with   Abdominal Pain    Carol [name] is a 66 y.o. female.   Abdominal Pain Associated symptoms: no chest pain, no constipation, no diarrhea, no dysuria, no fever, no hematuria, no nausea and no vomiting   Patient presented to the ER complaining of 3 days of left lower quadrant pain.  She reports a history of diverticulitis, perforated bowels and is concerned about current symptoms being fairly similar to past episode of diverticulitis.  She denies nausea, vomiting, fever, right lower quadrant pain, urinary symptoms, chest pain, shortness of breath.  Patient was given a dose of prednisone while in triage and has a known contrast allergy.     Home Medications Prior to Admission medications   Medication Sig Start Date End Date Taking? Authorizing Provider  sucralfate (CARAFATE) 1 g tablet Take 1 tablet (1 g total) by mouth 4 (four) times daily -  with meals and at bedtime. 03/22/22 04/21/22 Yes Luvenia Heller, PA-C  Ascorbic Acid (VITAMIN C) 1000 MG tablet Take 1,000 mg by mouth daily.    [provider]  aspirin 81 MG tablet Take 81 mg by mouth at bedtime.     [provider]  candesartan-hydrochlorothiazide (ATACAND HCT) 16-12.5 MG tablet Take 1 tablet by mouth daily. 01/10/22   Kuneff, Renee A, DO  cetirizine (ZYRTEC) 10 MG tablet Take 10 mg by mouth at bedtime.     [provider]  citalopram (CELEXA) 20 MG tablet Take 1 tablet (20 mg total) by mouth daily. 01/10/22   Kuneff, Renee A, DO  FERREX 150 150 MG capsule Take 1 capsule (150 mg total) by mouth daily. 01/10/22   Kuneff, Renee A, DO  fluocinonide ointment (LIDEX) [id number] % Apply 1 Application topically 2 (two) times daily. 11/05/21   Kuneff, Renee A, DO  glucose blood (ONETOUCH ULTRA) test strip test sugars TWICE DAILY as directed 12/27/21    Raoul Pitch, Renee A, DO  Lancets (ONETOUCH DELICA PLUS [id number]) MISC check blood sugar TWICE DAILY 12/27/21   Kuneff, Renee A, DO  linagliptin (TRADJENTA) 5 MG TABS tablet Take 1 tablet (5 mg total) by mouth daily. 07/26/21   Kuneff, Renee A, DO  metFORMIN (GLUCOPHAGE-XR) 750 MG 24 hr tablet Take 1 tablet (750 mg total) by mouth daily with breakfast. 01/10/22   Kuneff, Renee A, DO  metoprolol succinate (TOPROL-XL) 25 MG 24 hr tablet Take 1 tablet (25 mg total) by mouth daily. Take with or immediately following a meal. 01/10/22   Kuneff, Renee A, DO  nitrofurantoin, macrocrystal-monohydrate, (MACROBID) 100 MG capsule Take 1 capsule (100 mg total) by mouth at bedtime. Patient not taking: Reported on 03/21/2022 12/27/21   Howard Pouch A, DO  omeprazole (PRILOSEC) 20 MG capsule Take 1 capsule (20 mg total) by mouth daily. 01/10/22   Kuneff, Renee A, DO  Probiotic Product (PROBIOTIC BLEND PO) Take 2 capsules by mouth daily.    [provider]  rosuvastatin (CRESTOR) 40 MG tablet Take 1 tablet (40 mg total) by mouth at bedtime. 01/10/22   Kuneff, Renee A, DO  timolol (TIMOPTIC) 0.25 % ophthalmic solution Place 1 drop into the left eye 2 (two) times daily. 08/21/20   [provider]  Travoprost, BAK Free, (TRAVATAN) 0.004 % SOLN ophthalmic solution Place 1 drop into both eyes at bedtime.    [provider]      Allergies    Ivp dye [iodinated contrast media], Latanoprost, Lisinopril, Onion, and Sulfa antibiotics    Review of Systems   Review of Systems  Constitutional:  Negative for fever.  Respiratory:  Negative for chest tightness.   Cardiovascular:  Negative for chest pain.  Gastrointestinal:  Positive for abdominal pain. Negative for constipation, diarrhea, nausea and vomiting.  Genitourinary:  Negative for dysuria, hematuria and urgency.  Neurological:  Negative for light-headedness and headaches.  All other systems reviewed and are negative.   Physical Exam Updated Vital  Signs BP (!) 141/63 (BP Location: Left Arm)   Pulse 64   Temp 97.6 F (36.4 C) (Oral)   Resp 16   SpO2 92%  Physical Exam Vitals and nursing note reviewed.  Constitutional:      Appearance: She is well-developed.  Cardiovascular:     Rate and Rhythm: Normal rate and regular rhythm.     Heart sounds: Normal heart sounds.  Pulmonary:     Effort: Pulmonary effort is normal.     Breath sounds: Normal breath sounds.  Abdominal:     General: Abdomen is flat. Bowel sounds are normal. There is no distension. There are no signs of injury.     Palpations: Abdomen is soft. There is no hepatomegaly, splenomegaly, mass or pulsatile mass.     Tenderness: There is abdominal tenderness in the left upper quadrant and left lower quadrant. There is no guarding. Negative signs include Murphy's sign, Rovsing's sign and McBurney's sign.     Hernia: No hernia is present.  Skin:    General: Skin is warm and dry.  Neurological:     General: No focal deficit present.     Mental Status: She is alert.     ED Results / Procedures / Treatments   Labs (all labs ordered are listed, but only abnormal results are displayed) Labs Reviewed  COMPREHENSIVE METABOLIC PANEL - Abnormal; Notable for the following components:      Result Value   Glucose, Bld 115 (*)    All other components within normal limits  CBC WITH DIFFERENTIAL/PLATELET - Abnormal; Notable for the following components:   WBC 13.4 (*)    Platelets 436 (*)    Neutro Abs 8.2 (*)    All other components within normal limits  D-DIMER, QUANTITATIVE  URINALYSIS, ROUTINE W REFLEX MICROSCOPIC    EKG None  Radiology CT ABDOMEN PELVIS W CONTRAST  Result Date: 03/22/2022 CLINICAL DATA:  66 year old female with history of left upper quadrant abdominal pain. Left-sided flank pain. EXAM: CT ABDOMEN AND PELVIS WITH CONTRAST TECHNIQUE: Multidetector CT imaging of the abdomen and pelvis was performed using the standard protocol following bolus  administration of intravenous contrast. RADIATION DOSE REDUCTION: This exam was performed according to the departmental dose-optimization program which includes automated exposure control, adjustment of the mA and/or kV according to patient size and/or use of iterative reconstruction technique. CONTRAST:  73mL OMNIPAQUE IOHEXOL 350 MG/ML SOLN COMPARISON:  CT of the abdomen and pelvis 03/27/2019. FINDINGS: Lower chest: Unremarkable. Hepatobiliary: No suspicious cystic or solid hepatic lesions. No intra or extrahepatic biliary ductal dilatation. Some noncalcified gallstones lie dependently near the neck of the gallbladder. Gallbladder is nearly completely decompressed, but otherwise unremarkable in appearance. Pancreas: No pancreatic mass. No pancreatic ductal dilatation. No pancreatic or peripancreatic fluid collections or inflammatory changes. Spleen: Unremarkable. Adrenals/Urinary Tract: Bilateral kidneys and adrenal glands are normal in appearance. No hydroureteronephrosis. Urinary bladder is normal in appearance. Stomach/Bowel: The  appearance of the stomach is normal. There is no pathologic dilatation of small bowel or colon. Numerous colonic diverticuli are noted, without surrounding inflammatory changes to indicate an acute diverticulitis at this time. The appendix is not confidently identified and may be surgically absent. Regardless, there are no inflammatory changes noted adjacent to the cecum to suggest the presence of an acute appendicitis at this time. Postoperative changes of partial small bowel resection are noted in the region of the distal ileum. Vascular/Lymphatic: Aortic atherosclerosis, without evidence of aneurysm or dissection in the abdominal or pelvic vasculature. No lymphadenopathy noted in the abdomen or pelvis. Reproductive: Status post hysterectomy. Ovaries are not confidently identified may be surgically absent or atrophic. Other: No significant volume of ascites.  No pneumoperitoneum.  Musculoskeletal: There are no aggressive appearing lytic or blastic lesions noted in the visualized portions of the skeleton. IMPRESSION: 1. No acute findings are noted in the abdomen or pelvis to account for the patient's symptoms. 2. Cholelithiasis without evidence of acute cholecystitis. 3. Colonic diverticulosis without evidence of acute diverticulitis at this time. 4. Aortic atherosclerosis. Electronically Signed   By: Trudie Reed M.D.   On: 03/22/2022 06:26    Procedures Procedures   Medications Ordered in ED Medications  diphenhydrAMINE (BENADRYL) injection 50 mg (50 mg Intravenous Given 03/22/22 0455)  methylPREDNISolone sodium succinate (SOLU-MEDROL) 40 mg/mL injection 40 mg (40 mg Intravenous Given 03/22/22 0507)  iohexol (OMNIPAQUE) 350 MG/ML injection 75 mL (75 mLs Intravenous Contrast Given 03/22/22 0618)    ED Course/ Medical Decision Making/ A&P                           Medical Decision Making Risk Prescription drug management.   This patient presents to the ED for concern of left lower quadrant and left upper quadrant pain.  Differential diagnosis includes splenic infarct, bowel obstruction, gastroenteritis, diverticulosis, diverticulitis, C. difficile.    Additional history obtained:  External records from outside source obtained and reviewed including CT under abdominal from 03/27/2019 showing evidence of diverticulosis.   Lab Tests:  I Ordered, and personally interpreted labs.  The pertinent results include: Elevated white count at 13.4.  Normal D-dimer.   Imaging Studies ordered:  I ordered imaging studies including CT abdomen pelvis with contrast I independently visualized and interpreted imaging which showed no acute findings I agree with the radiologist interpretation   Medicines ordered and prescription drug management:  I ordered medication including prednisone and Benadryl for CT imaging I have reviewed the patients home medicines and have  made adjustments as needed   Problem List / ED Course:  Patient presented to the ER  with 3 days of LLQ pain. Patient was primarily concerned due to history of diverticulosis and diverticulitis and felt that discomfort was similar. Patient otherwise healthy with no associated signs or symptoms. Workup was reassuring with no acute abnormalities noted on labs. Imaging was reassuring with no signs of diverticulitis or bowel perforation. Patient's symptoms could be due to poorly controlled GERD. Discussed reassuring findings with patient. Patient verbalized understanding results and was agreeable to plan to discharge home. Return precautions discussed and patient verbalized understanding.   Final Clinical Impression(s) / ED Diagnoses Final diagnoses:  Left upper quadrant abdominal pain  Gastroesophageal reflux disease, unspecified whether esophagitis present    Rx / DC Orders ED Discharge Orders          Ordered    sucralfate (CARAFATE) 1 g tablet  3 times  daily with meals & bedtime        03/22/22 0704              Luvenia Heller, PA-C 03/22/22 [id number]    Maudie Flakes, MD 03/22/22 [phone]

## 2022-03-22 NOTE — ED Notes (Signed)
Patient transported to CT 

## 2022-03-24 ENCOUNTER — Encounter: Payer: Self-pay | Admitting: Family Medicine

## 2022-03-24 ENCOUNTER — Ambulatory Visit (INDEPENDENT_AMBULATORY_CARE_PROVIDER_SITE_OTHER): Payer: Medicare Other | Admitting: Family Medicine

## 2022-03-24 VITALS — BP 122/57 | HR 48 | Temp 98.1°F | Ht 63.0 in | Wt 195.0 lb

## 2022-03-24 DIAGNOSIS — R1012 Left upper quadrant pain: Secondary | ICD-10-CM | POA: Diagnosis not present

## 2022-03-24 LAB — CBC WITH DIFFERENTIAL/PLATELET
Basophils Absolute: 0 10*3/uL (ref 0.0–0.1)
Basophils Relative: 0.2 % (ref 0.0–3.0)
Eosinophils Absolute: 0.3 10*3/uL (ref 0.0–0.7)
Eosinophils Relative: 2.9 % (ref 0.0–5.0)
HCT: 37.5 % (ref 36.0–46.0)
Hemoglobin: 12.2 g/dL (ref 12.0–15.0)
Lymphocytes Relative: 35 % (ref 12.0–46.0)
Lymphs Abs: 4.2 10*3/uL — ABNORMAL HIGH (ref 0.7–4.0)
MCHC: 32.7 g/dL (ref 30.0–36.0)
MCV: 83.2 fl (ref 78.0–100.0)
Monocytes Absolute: 1.1 10*3/uL — ABNORMAL HIGH (ref 0.1–1.0)
Monocytes Relative: 8.8 % (ref 3.0–12.0)
Neutro Abs: 6.4 10*3/uL (ref 1.4–7.7)
Neutrophils Relative %: 53.1 % (ref 43.0–77.0)
Platelets: 454 10*3/uL — ABNORMAL HIGH (ref 150.0–400.0)
RBC: 4.51 Mil/uL (ref 3.87–5.11)
RDW: 15.5 % (ref 11.5–15.5)
WBC: 12.1 10*3/uL — ABNORMAL HIGH (ref 4.0–10.5)

## 2022-03-24 LAB — C-REACTIVE PROTEIN: CRP: <1 mg/dL (ref 0.5–20.0)

## 2022-03-24 LAB — LIPASE: Lipase: 37 U/L (ref 11.0–59.0)

## 2022-03-24 MED ORDER — PREDNISONE 20 MG PO TABS: 20.0000 mg | ORAL_TABLET | Freq: Every day | ORAL | 0 refills | Status: DC

## 2022-03-24 NOTE — Progress Notes (Signed)
Carol [name] , 04-22-1955, 66 y.o., female MRN: [id number] Patient Care Team    Relationship Specialty Notifications Start End  Ma Hillock, DO PCP - General Family Medicine  06/10/19   Jerline Pain, MD Consulting Physician Cardiology  06/12/19   Gatha Mayer, MD Consulting Physician Gastroenterology  06/12/19   Danice Goltz, MD Consulting Physician Ophthalmology  06/12/19   Sonda Primes, MD Referring Physician Obstetrics and Gynecology  06/12/19   Jiles Prows, MD Consulting Physician Allergy and Immunology  06/12/19   [facility]  07/26/21     Chief Complaint  Patient presents with   Abdominal Pain    Some improvement;      Subjective:Carol [name] is a 66 y.o. female presents today to follow-up after ED visit for abdominal pain.  Patient was seen here 3 days ago for severe abdominal pain and was encouraged to present to the ED for emergent evaluation.  In the emergency room she had a mildly elevated white blood cell count.  CMP was normal.  She underwent CT of the abdomen which was normal with the exception of cholelithiasis and the presence of diverticulosis without diverticulitis.  She reports since that time her pain has mildly improved, but still present.  Prior note: pt presents for an OV with complaints of abd pain - started Saturday.  No nausea, vomit or diarrhea. No fever. Severe pain that is constant. Worse with any movement. H/o bowel resection and diverticulitis.      01/10/2022    8:25 AM 07/26/2021    8:09 AM 07/06/2021    1:43 PM 02/02/2021    1:27 PM 11/18/2020    1:04 PM  Depression screen PHQ 2/9  Decreased Interest 0 0 0 0 0  Down, Depressed, Hopeless 0 0 0  0  PHQ - 2 Score 0 0 0 0 0  Altered sleeping  0     Tired, decreased energy  3     Change in appetite  0     Feeling bad or failure about yourself   0     Trouble concentrating  0     Moving slowly or fidgety/restless  0     Suicidal thoughts  0      PHQ-9 Score  3       Allergies  Allergen Reactions   Ivp Dye [Iodinated Contrast Media] Hives and Shortness Of Breath    Did ok in OP setting with 13 hr Premedications    Latanoprost    Lisinopril Cough   Onion Rash    Rash and GI upset   Sulfa Antibiotics Hives   Social History   Social History Narrative   Marital status/children/pets: Married.   Education/employment: Employed as an Glass blower/designer, high school Writer.   Safety:      -smoke alarm in the home:Yes     - wears seatbelt: Yes     - Feels safe in their relationships: Yes   Past Medical History:  Diagnosis Date   Angio-edema    Chest discomfort    , Atypical   Chicken pox    COVID-19 05/09/2019   Diabetic acidosis, type II (HCC)    Diverticulitis - large vs small bowel vs both?    Gastroesophageal reflux disease    Glaucoma    Hyperlipidemia    Hypertension    PVC (premature ventricular contraction)    Systolic dysfunction    ,  Hyperdynamic   Past Surgical History:  Procedure Laterality Date   APPENDECTOMY N/A 11/05/2013   Procedure: APPENDECTOMY;  Surgeon: Gwenyth Ober, MD;  Location: [place];  Service: General;  Laterality: N/A;   BOWEL RESECTION N/A 11/05/2013   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  "Normal "performed by Dr. Ilda Foil   COLONOSCOPY  04/03/2019   LAPAROTOMY N/A 11/05/2013   Procedure: EXPLORATORY LAPAROTOMY;  Surgeon: Gwenyth Ober, MD;  Location: [place];  Service: General;  Laterality: N/A;   Lexiscan  2010   Patient reports normal Lexiscan completed around 2010 by Dr. Ilda Foil.   TOTAL ABDOMINAL HYSTERECTOMY  2004   Family History  Problem Relation Age of Onset   Heart disease Mother    Heart attack Mother    Asthma Mother    Early death Mother    Miscarriages / Stillbirths Mother    Heart disease Father    Lung cancer Father    Heart attack Brother    Asthma Brother    Heart disease Brother     Kidney disease Brother    Diverticulitis Sister    Heart disease Brother    Heart attack Brother    Heart disease Brother    Heart attack Brother    Early death Maternal Grandmother    Tuberculosis Maternal Grandmother    Depression Maternal Grandfather    Heart disease Paternal Grandmother    Heart disease Sister    Colon cancer Neg Hx    Esophageal cancer Neg Hx    Rectal cancer Neg Hx    Stomach cancer Neg Hx    Allergies as of 03/24/2022       Reactions   Ivp Dye [iodinated Contrast Media] Hives, Shortness Of Breath   Did ok in OP setting with 13 hr Premedications    Latanoprost    Lisinopril Cough   Onion Rash   Rash and GI upset   Sulfa Antibiotics Hives        Medication List        Accurate as of March 24, 2022  8:56 AM. If you have any questions, ask your nurse or doctor.          aspirin 81 MG tablet Take 81 mg by mouth at bedtime.   candesartan-hydrochlorothiazide 16-12.5 MG tablet Commonly known as: ATACAND HCT Take 1 tablet by mouth daily.   cetirizine 10 MG tablet Commonly known as: ZYRTEC Take 10 mg by mouth at bedtime.   citalopram 20 MG tablet Commonly known as: CELEXA Take 1 tablet (20 mg total) by mouth daily.   Ferrex 150 150 MG capsule Generic drug: iron polysaccharides Take 1 capsule (150 mg total) by mouth daily.   fluocinonide ointment 0.05 % Commonly known as: LIDEX Apply 1 Application topically 2 (two) times daily.   linagliptin 5 MG Tabs tablet Commonly known as: TRADJENTA Take 1 tablet (5 mg total) by mouth daily.   metFORMIN 750 MG 24 hr tablet Commonly known as: GLUCOPHAGE-XR Take 1 tablet (750 mg total) by mouth daily with breakfast.   metoprolol succinate 25 MG 24 hr tablet Commonly known as: TOPROL-XL Take 1 tablet (25 mg total) by mouth daily. Take with or immediately following a meal.   nitrofurantoin (macrocrystal-monohydrate) 100 MG capsule Commonly known as: Macrobid Take 1 capsule (100 mg total) by  mouth at bedtime.   omeprazole 20 MG capsule Commonly known as: PRILOSEC Take 1 capsule (  20 mg total) by mouth daily.   OneTouch Delica Plus 0000000 Misc check blood sugar TWICE DAILY   OneTouch Ultra test strip Generic drug: glucose blood test sugars TWICE DAILY as directed   PROBIOTIC BLEND PO Take 2 capsules by mouth daily.   rosuvastatin 40 MG tablet Commonly known as: CRESTOR Take 1 tablet (40 mg total) by mouth at bedtime.   sucralfate 1 g tablet Commonly known as: Carafate Take 1 tablet (1 g total) by mouth 4 (four) times daily -  with meals and at bedtime.   timolol 0.25 % ophthalmic solution Commonly known as: TIMOPTIC Place 1 drop into the left eye 2 (two) times daily.   Travoprost (BAK Free) 0.004 % Soln ophthalmic solution Commonly known as: TRAVATAN Place 1 drop into both eyes at bedtime.   vitamin C 1000 MG tablet Take 1,000 mg by mouth daily.        All past medical history, surgical history, allergies, family history, immunizations andmedications were updated in the EMR today and reviewed under the history and medication portions of their EMR.     Review of Systems  Constitutional:  Negative for chills and fever.  Gastrointestinal:  Positive for abdominal pain. Negative for blood in stool, constipation, diarrhea, heartburn, nausea and vomiting.   Negative, with the exception of above mentioned in HPI   Objective:  BP (!) 122/57   Pulse (!) 48   Temp 98.1 F (36.7 C) (Oral)   Ht 5\' 3"  (1.6 m)   Wt 195 lb (88.5 kg)   SpO2 98%   BMI 34.54 kg/m  Body mass index is 34.54 kg/m. Physical Exam Vitals and nursing note reviewed.  Constitutional:      General: She is not in acute distress.    Appearance: Normal appearance. She is normal weight. She is not ill-appearing or toxic-appearing.  Eyes:     Extraocular Movements: Extraocular movements intact.     Conjunctiva/sclera: Conjunctivae normal.     Pupils: Pupils are equal, round, and  reactive to light.  Abdominal:     General: Abdomen is flat. Bowel sounds are normal. There is no distension. There are no signs of injury.     Palpations: Abdomen is soft.     Tenderness: There is abdominal tenderness (Mild tenderness to palpation today) in the left upper quadrant. There is no guarding or rebound.  Neurological:     Mental Status: She is alert and oriented to person, place, and time. Mental status is at baseline.  Psychiatric:        Mood and Affect: Mood normal.        Behavior: Behavior normal.        Thought Content: Thought content normal.        Judgment: Judgment normal.      No results found. No results found. No results found for this or any previous visit (from the past 24 hour(s)).  Assessment/Plan: Carol [name] is a 66 y.o. female present for OV for  Left upper quadrant abdominal pain Discussed her CT results with her in more detail.  She does have evidence of cholelithiasis in the gallbladder neck.  I suspect she may have had a gallbladder stone blocking the pancreatic duct.  Pain is resolving. We discussed if it was a gallstone, and there is evidence of additional gallstones in the gallbladder neck, this may occur again in the future. If that is the case, then at that time I would recommend she consider cholecystectomy. I would  like to obtain a lipase and a CRP.  Knowingly the labs may be normal now, but if elevated may suggest pancreatic etiology/gallstones. She would like to proceed with lab work today. Short course of prednisone prescribed to help with inflammation.  Reviewed expectations re: course of current medical issues. Discussed self-management of symptoms. Outlined signs and symptoms indicating need for more acute intervention. Patient verbalized understanding and all questions were answered. Patient received an After-Visit Summary.    No orders of the defined types were placed in this encounter.  No orders of the defined types were  placed in this encounter.  Referral Orders  No referral(s) requested today     Note is dictated utilizing voice recognition software. Although note has been proof read prior to signing, occasional typographical errors still can be missed. If any questions arise, please do not hesitate to call for verification.   electronically signed by:  Felix Pacini, DO  [facility] [place]

## 2022-03-24 NOTE — Patient Instructions (Signed)
Cholelithiasis  Cholelithiasis happens when gallstones form in the gallbladder. The gallbladder stores bile. Bile is a fluid that helps digest fats. Bile can harden and form into gallstones. If they cause a blockage, they can cause pain (gallbladder attack). What are the causes? This condition may be caused by: Some blood diseases, such as sickle cell anemia. Too much of a fat-like substance (cholesterol) in your bile. Not enough bile salts in your bile. These salts help the body absorb and digest fats. The gallbladder not emptying fully or often enough. This is common in pregnant women. What increases the risk? The following factors may make you more likely to develop this condition: Being female. Being pregnant many times. Eating a lot of fried foods, fat, and refined carbs (refined carbohydrates). Being very overweight (obese). Being older than age 40. Using medicines with female hormones in them for a long time. Losing weight fast. Having gallstones in your family. Having some health problems, such as diabetes, Crohn's disease, or liver disease. What are the signs or symptoms? Often, there may be gallstones but no symptoms. These gallstones are called silent gallstones. If a gallstone causes a blockage, you may get sudden pain. The pain: Can be in the upper right part of your belly (abdomen). Normally comes at night or after you eat. Can last an hour or more. Can spread to your right shoulder, back, or chest. Can feel like discomfort, burning, or fullness in the upper part of your belly (indigestion). If the blockage lasts more than a few hours, you can get an infection or swelling. You may: Feel like you may vomit. Vomit. Feel bloated. Have belly pain for 5 hours or more. Feel tender in your belly, often in the upper right part and under your ribs. Have fever or chills. Have skin or the white parts of your eyes turn yellow (jaundice). Have dark pee (urine) or pale poop  (stool). How is this treated? Treatment for this condition depends on how bad you feel. If you have symptoms, you may need: Home care, if symptoms are not very bad. Do not eat for 12-24 hours. Drink only water and clear liquids. Start to eat simple or clear foods after 1 or 2 days. Try broths and crackers. You may need medicines for pain or stomach upset or both. If you have an infection, you will need antibiotics. A hospital stay, if you have very bad pain or a very bad infection. Surgery to remove your gallbladder. You may need this if: Gallstones keep coming back. You have very bad symptoms. Medicines to break up gallstones. Medicines: Are best for small gallstones. May be used for up to 6-12 months. A procedure to find and take out gallstones or to break up gallstones. Follow these instructions at home: Medicines Take over-the-counter and prescription medicines only as told by your doctor. If you were prescribed an antibiotic medicine, take it as told by your doctor. Do not stop taking the antibiotic even if you start to feel better. Ask your doctor if the medicine prescribed to you requires you to avoid driving or using machinery. Eating and drinking Drink enough fluid to keep your urine pale yellow. Drink water or clear fluids. This is important when you have pain. Eat healthy foods. Choose: Fewer fatty foods, such as fried foods. Fewer refined carbs. Avoid breads and grains that are highly processed, such as white bread and white rice. Choose whole grains, such as whole-wheat bread and brown rice. More fiber. Almonds, fresh fruit, and   beans are healthy sources. General instructions Keep a healthy weight. Keep all follow-up visits as told by your doctor. This is important. Where to find more information National Institute of Diabetes and Digestive and Kidney Diseases: www.niddk.nih.gov Contact a doctor if: You have sudden pain in the upper right part of your belly. Pain might  spread to your right shoulder, back, or chest. You have been diagnosed with gallstones that have no symptoms and you get: Belly pain. Discomfort, burning, or fullness in the upper part of your abdomen. You have dark urine or pale stools. Get help right away if: You have sudden pain in the upper right part of your abdomen, and the pain lasts more than 2 hours. You have pain in your abdomen, and: It lasts more than 5 hours. It keeps getting worse. You have a fever or chills. You keep feeling like you may vomit. You keep vomiting. Your skin or the white parts of your eyes turn yellow. Summary Cholelithiasis happens when gallstones form in the gallbladder. This condition may be caused by a blood disease, too much of a fat-like substance in the bile, or not enough bile salts in bile. Treatment for this condition depends on how bad you feel. If you have symptoms, do not eat or drink. You may need medicines. You may need a hospital stay for very bad pain or a very bad infection. You may need surgery if gallstones keep coming back or if you have very bad symptoms. This information is not intended to replace advice given to you by your health care provider. Make sure you discuss any questions you have with your health care provider. Document Revised: 05/17/2019 Document Reviewed: 02/18/2019 Elsevier Patient Education  2023 Elsevier Inc.  

## 2022-03-25 ENCOUNTER — Telehealth: Payer: Self-pay | Admitting: Family Medicine

## 2022-03-25 NOTE — Telephone Encounter (Signed)
Please inform patient: Inflammatory marker is normal. Lipase is normal. Total white blood cell counts are mildly elevated, but all the individual white blood cell counts within themselves are normal.  Would encourage her to make sure she is taking an adequate hydration.   If the pain worsens instead of continuing to improve or she experiences fever, chills or changes in her bowel habits, then I want to see her back immediately.

## 2022-03-25 NOTE — Telephone Encounter (Signed)
Spoke with patient regarding results/recommendations.  

## 2022-04-20 ENCOUNTER — Other Ambulatory Visit: Payer: Self-pay | Admitting: Family Medicine

## 2022-05-02 ENCOUNTER — Other Ambulatory Visit: Payer: Self-pay | Admitting: Family Medicine

## 2022-05-02 MED ORDER — LINAGLIPTIN 5 MG PO TABS: 5.0000 mg | ORAL_TABLET | Freq: Every day | ORAL | 0 refills | Status: DC

## 2022-05-02 NOTE — Addendum Note (Signed)
Addended by: Beatrix Fetters on: 05/02/2022 09:32 AM   Modules accepted: Orders

## 2022-05-02 NOTE — Telephone Encounter (Signed)
Pt is needing refill on Tradjenta. Pharmacy is [facility] in [place]

## 2022-06-03 ENCOUNTER — Encounter: Payer: Self-pay | Admitting: Family Medicine

## 2022-06-03 ENCOUNTER — Ambulatory Visit (HOSPITAL_BASED_OUTPATIENT_CLINIC_OR_DEPARTMENT_OTHER)
Admission: RE | Admit: 2022-06-03 | Discharge: 2022-06-03 | Disposition: A | Discharge status: Home / Self Care | Payer: Medicare Other | Source: Ambulatory Visit | Attending: Family Medicine | Admitting: Family Medicine

## 2022-06-03 ENCOUNTER — Ambulatory Visit (INDEPENDENT_AMBULATORY_CARE_PROVIDER_SITE_OTHER): Payer: Medicare Other | Admitting: Family Medicine

## 2022-06-03 VITALS — BP 133/73 | HR 55 | Temp 98.0°F | Ht 63.0 in

## 2022-06-03 DIAGNOSIS — M1380 Other specified arthritis, unspecified site: Secondary | ICD-10-CM | POA: Diagnosis not present

## 2022-06-03 DIAGNOSIS — M79645 Pain in left finger(s): Secondary | ICD-10-CM | POA: Insufficient documentation

## 2022-06-03 DIAGNOSIS — M19032 Primary osteoarthritis, left wrist: Secondary | ICD-10-CM | POA: Diagnosis not present

## 2022-06-03 DIAGNOSIS — G8929 Other chronic pain: Secondary | ICD-10-CM | POA: Insufficient documentation

## 2022-06-03 DIAGNOSIS — M19042 Primary osteoarthritis, left hand: Secondary | ICD-10-CM | POA: Diagnosis not present

## 2022-06-03 MED ORDER — MELOXICAM 15 MG PO TABS: 15.0000 mg | ORAL_TABLET | Freq: Every day | ORAL | 1 refills | Status: DC

## 2022-06-03 NOTE — Patient Instructions (Signed)
No follow-ups on file.        Great to see you today.  I have refilled the medication(s) we provide.   If labs were collected, we will inform you of lab results once received either by echart message or telephone call.   - echart message- for normal results that have been seen by the patient already.   - telephone call: abnormal results or if patient has not viewed results in their echart.  

## 2022-06-03 NOTE — Progress Notes (Signed)
Carol [name] , [birth year], 67 y.o., female MRN: [id number] Patient Care Team    Relationship Specialty Notifications Start End  Ma Hillock, DO PCP - General Family Medicine  06/10/19   Jerline Pain, MD Consulting Physician Cardiology  06/12/19   Gatha Mayer, MD Consulting Physician Gastroenterology  06/12/19   Danice Goltz, MD Consulting Physician Ophthalmology  06/12/19   Sonda Primes, MD Referring Physician Obstetrics and Gynecology  06/12/19   Jiles Prows, MD Consulting Physician Allergy and Immunology  06/12/19   [facility]  07/26/21     Chief Complaint  Patient presents with   Wrist Pain    Pt c/o L wrist pain x 3 mos     Subjective: Carol [name] is a 67 y.o. Pt presents for an OV with complaints of left thumb and wrist pain for approximately 3 months in duration.  She had similar symptoms on the right side in the past with an x-ray that resulted with osteoarthritis of first metacarpal joint.  She reports the pain in the left just is not getting any better and some days it is excruciating.  She sometimes will take anti-inflammatories to help.  She tried wearing a wrist splint which was not helpful.     01/10/2022    8:25 AM 07/26/2021    8:09 AM 07/06/2021    1:43 PM 02/02/2021    1:27 PM 11/18/2020    1:04 PM  Depression screen PHQ 2/9  Decreased Interest 0 0 0 0 0  Down, Depressed, Hopeless 0 0 0  0  PHQ - 2 Score 0 0 0 0 0  Altered sleeping  0     Tired, decreased energy  3     Change in appetite  0     Feeling bad or failure about yourself   0     Trouble concentrating  0     Moving slowly or fidgety/restless  0     Suicidal thoughts  0     PHQ-9 Score  3       Allergies  Allergen Reactions   Ivp Dye [Iodinated Contrast Media] Hives and Shortness Of Breath    Did ok in OP setting with 13 hr Premedications    Latanoprost    Lisinopril Cough   Onion Rash    Rash and GI upset   Sulfa Antibiotics Hives    Social History   Social History Narrative   Marital status/children/pets: Married.   Education/employment: Employed as an Glass blower/designer, high school Writer.   Safety:      -smoke alarm in the home:Yes     - wears seatbelt: Yes     - Feels safe in their relationships: Yes   Past Medical History:  Diagnosis Date   Angio-edema    Chest discomfort    , Atypical   Chicken pox    COVID-19 05/09/2019   Diabetic acidosis, type II (HCC)    Diverticulitis - large vs small bowel vs both?    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  "Normal "performed by Dr. Ilda Foil   COLONOSCOPY  04/03/2019   LAPAROTOMY N/A 11/05/2013   Procedure: EXPLORATORY LAPAROTOMY;  Surgeon: Gwenyth Ober, MD;  Location: [facility];  Service: General;  Laterality: N/A;   Lexiscan  2010   Patient reports normal Lexiscan completed around 2010 by Dr. Ilda Foil.   TOTAL ABDOMINAL HYSTERECTOMY  2004   Family History  Problem Relation Age of Onset   Heart disease Mother    Heart attack Mother    Asthma Mother    Early death Mother    Miscarriages / Stillbirths Mother    Heart disease Father    Lung cancer Father    Heart attack Brother    Asthma Brother    Heart disease Brother    Kidney disease Brother    Diverticulitis Sister    Heart disease Brother    Heart attack Brother    Heart disease Brother    Heart attack Brother    Early death Maternal Grandmother    Tuberculosis Maternal Grandmother    Depression Maternal Grandfather    Heart  disease Paternal Grandmother    Heart disease Sister    Colon cancer Neg Hx    Esophageal cancer Neg Hx    Rectal cancer Neg Hx    Stomach cancer Neg Hx    Allergies as of 06/03/2022       Reactions   Ivp Dye [iodinated Contrast Media] Hives, Shortness Of Breath   Did ok in OP setting with 13 hr Premedications    Latanoprost    Lisinopril Cough   Onion Rash   Rash and GI upset   Sulfa Antibiotics Hives        Medication List        Accurate as of June 03, 2022  4:46 PM. If you have any questions, ask your nurse or doctor.          STOP taking these medications    nitrofurantoin (macrocrystal-monohydrate) 100 MG capsule Commonly known as: Macrobid Stopped by: Howard Pouch, DO   predniSONE 20 MG tablet Commonly known as: DELTASONE Stopped by: Howard Pouch, DO       TAKE these medications    aspirin 81 MG tablet Take 81 mg by mouth at bedtime.   candesartan-hydrochlorothiazide 16-12.5 MG tablet Commonly known as: ATACAND HCT Take 1 tablet by mouth daily.   cetirizine 10 MG tablet Commonly known as: ZYRTEC Take 10 mg by mouth at bedtime.   citalopram 20 MG tablet Commonly known as: CELEXA Take 1 tablet (20 mg total) by mouth daily.   Ferrex 150 150 MG capsule Generic drug: iron polysaccharides Take 1 capsule (150 mg total) by mouth daily.   Fluad Quadrivalent 0.5 ML injection Generic drug: influenza vaccine adjuvanted   fluocinonide ointment 0.05 % Commonly known as: LIDEX Apply 1 Application topically 2 (two) times daily.   linagliptin 5 MG Tabs tablet Commonly known as: TRADJENTA Take 1 tablet (5 mg total) by mouth daily.   meloxicam 15 MG tablet Commonly known as: MOBIC Take 1 tablet (15 mg total) by mouth daily. Started by: Howard Pouch, DO   metFORMIN 750 MG 24 hr tablet Commonly known as: GLUCOPHAGE-XR Take 1 tablet (750 mg total) by mouth daily with breakfast.   metoprolol succinate 25 MG 24 hr tablet Commonly known as:  TOPROL-XL Take 1 tablet (25 mg total)  by mouth daily. Take with or immediately following a meal.   omeprazole 20 MG capsule Commonly known as: PRILOSEC Take 1 capsule (20 mg total) by mouth daily.   OneTouch Delica Plus 0000000 Misc check blood sugar TWICE DAILY   OneTouch Ultra test strip Generic drug: glucose blood test sugars TWICE DAILY as directed   PROBIOTIC BLEND PO Take 2 capsules by mouth daily.   rosuvastatin 40 MG tablet Commonly known as: CRESTOR Take 1 tablet (40 mg total) by mouth at bedtime.   timolol 0.25 % ophthalmic solution Commonly known as: TIMOPTIC Place 1 drop into the left eye 2 (two) times daily.   Travoprost (BAK Free) 0.004 % Soln ophthalmic solution Commonly known as: TRAVATAN Place 1 drop into both eyes at bedtime.   vitamin C 1000 MG tablet Take 1,000 mg by mouth daily.        All past medical history, surgical history, allergies, family history, immunizations andmedications were updated in the EMR today and reviewed under the history and medication portions of their EMR.     ROS Negative, with the exception of above mentioned in HPI   Objective:  BP 133/73   Pulse (!) 55   Temp 98 F (36.7 C) (Oral)   Ht '5\' 3"'$  (1.6 m)   SpO2 98%   BMI 34.54 kg/m  Body mass index is 34.54 kg/m. Physical Exam Vitals and nursing note reviewed.  Constitutional:      General: She is not in acute distress.    Appearance: Normal appearance. She is normal weight. She is not ill-appearing or toxic-appearing.  HENT:     Head: Normocephalic and atraumatic.  Eyes:     General: No scleral icterus.       Right eye: No discharge.        Left eye: No discharge.     Extraocular Movements: Extraocular movements intact.     Conjunctiva/sclera: Conjunctivae normal.     Pupils: Pupils are equal, round, and reactive to light.  Musculoskeletal:        General: Swelling and tenderness present.     Comments: Left hand: No erythema, mild swelling first  metacarpal joint.  Tenderness to palpation over this joint.  Positive Finkelstein.  Discomfort with thumb to finger movements.  Decreased range of motion.  Skin:    Findings: No rash.  Neurological:     Mental Status: She is alert and oriented to person, place, and time. Mental status is at baseline.     Motor: No weakness.     Coordination: Coordination normal.     Gait: Gait normal.  Psychiatric:        Mood and Affect: Mood normal.        Behavior: Behavior normal.        Thought Content: Thought content normal.        Judgment: Judgment normal.     No results found. No results found. No results found for this or any previous visit (from the past 24 hour(s)).  Assessment/Plan: Carol [name] is a 67 y.o. 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  She has had x-rays of her right thumb, which showed evidence of arthritis. Encouraged her to try over-the-counter products such as Voltaren gel, capsaicin cream or Aspercreme to the area. Thumb x-ray ordered today to better evaluate.  If it appears to be severe arthritis/degeneration of the joint, would refer  to hand surgery. Otherwise try Mobic daily with food and topical products listed above for comfort.  Reviewed expectations re: course of current medical issues. Discussed self-management of symptoms. Outlined signs and symptoms indicating need for more acute intervention. Patient verbalized understanding and all questions were answered. Patient received an After-Visit Summary.    Orders Placed This Encounter  Procedures   DG Finger Thumb Left   Meds ordered this encounter  Medications   meloxicam (MOBIC) 15 MG tablet    Sig: Take 1 tablet (15 mg total) by mouth daily.    Dispense:  90 tablet    Refill:  1   Referral Orders  No referral(s) requested today     Note is dictated utilizing voice recognition  software. Although note has been proof read prior to signing, occasional typographical errors still can be missed. If any questions arise, please do not hesitate to call for verification.   electronically signed by:  Howard Pouch, DO  [place]

## 2022-06-06 ENCOUNTER — Telehealth: Payer: Self-pay | Admitting: Family Medicine

## 2022-06-06 NOTE — Telephone Encounter (Signed)
Pt would like a call back, however I did provide her with the recommendations that Dr. Raoul Pitch suggested and she would like to try the medication first before the referral to a hand surgeon.

## 2022-06-06 NOTE — Telephone Encounter (Signed)
Spoke with patient regarding results/recommendations.  

## 2022-06-06 NOTE — Telephone Encounter (Signed)
These inform patient her x-ray did show severe arthritis in the carpal metacarpal joint, which is the area we discussed during her visit.   Her options are trying the medication we discussed during her visit and see if it gives her comfort. Otherwise next option would be referral to hand surgery, which would be appropriate now considering its severe arthritis.  If she would like referral placed for hand surgery, we can do that for her today.  Please advise

## 2022-06-06 NOTE — Telephone Encounter (Signed)
LM for pt to return call to discuss.  

## 2022-06-27 ENCOUNTER — Ambulatory Visit (INDEPENDENT_AMBULATORY_CARE_PROVIDER_SITE_OTHER): Payer: Medicare Other | Admitting: Family Medicine

## 2022-06-27 ENCOUNTER — Encounter: Payer: Self-pay | Admitting: Family Medicine

## 2022-06-27 VITALS — BP 113/66 | HR 66 | Temp 98.0°F | Wt 192.4 lb

## 2022-06-27 DIAGNOSIS — R051 Acute cough: Secondary | ICD-10-CM

## 2022-06-27 DIAGNOSIS — J180 Bronchopneumonia, unspecified organism: Secondary | ICD-10-CM | POA: Diagnosis not present

## 2022-06-27 LAB — POCT INFLUENZA A/B
Influenza A, POC: NEGATIVE
Influenza B, POC: NEGATIVE

## 2022-06-27 LAB — POC COVID19 BINAXNOW: SARS Coronavirus 2 Ag: NEGATIVE

## 2022-06-27 MED ORDER — BENZONATATE 200 MG PO CAPS: 200.0000 mg | ORAL_CAPSULE | Freq: Three times a day (TID) | ORAL | 0 refills | Status: DC | PRN

## 2022-06-27 MED ORDER — DOXYCYCLINE HYCLATE 100 MG PO TABS: 100.0000 mg | ORAL_TABLET | Freq: Two times a day (BID) | ORAL | 0 refills | Status: DC

## 2022-06-27 MED ORDER — ALBUTEROL SULFATE HFA 108 (90 BASE) MCG/ACT IN AERS: 2.0000 | INHALATION_SPRAY | Freq: Four times a day (QID) | RESPIRATORY_TRACT | 0 refills | Status: DC | PRN

## 2022-06-27 MED ORDER — METHYLPREDNISOLONE ACETATE 80 MG/ML IJ SUSP
80.0000 mg | Freq: Once | INTRAMUSCULAR | Status: AC
  Administered 2022-06-27: 80 mg via INTRAMUSCULAR

## 2022-06-27 NOTE — Patient Instructions (Addendum)
Return if symptoms worsen or fail to improve.        Great to see you today.  I have refilled the medication(s) we provide.   If labs were collected, we will inform you of lab results once received either by echart message or telephone call.   - echart message- for normal results that have been seen by the patient already.   - telephone call: abnormal results or if patient has not viewed results in their echart.  

## 2022-06-27 NOTE — Progress Notes (Signed)
Carol [name] , 1955/10/22, 67 y.o., female MRN: [id number] Patient Care Team    Relationship Specialty Notifications Start End  Ma Hillock, DO PCP - General Family Medicine  06/10/19   Jerline Pain, MD Consulting Physician Cardiology  06/12/19   Gatha Mayer, MD Consulting Physician Gastroenterology  06/12/19   Danice Goltz, MD Consulting Physician Ophthalmology  06/12/19   Sonda Primes, MD Referring Physician Obstetrics and Gynecology  06/12/19   Jiles Prows, MD Consulting Physician Allergy and Immunology  06/12/19   [facility]  07/26/21     Chief Complaint  Patient presents with   Cough    Onset 03/13; HA     Subjective: Carol [name] is a 67 y.o. Pt presents for an OV with complaints of cough of 5 days duration.  Associated symptoms include low grade fever, headache, decreased appetite. Cough is dry.  Pt has tried coricidin  to ease their symptoms.  She has not been around anyone she knows has been ill.  She feels tight in her lungs.       01/10/2022    8:25 AM 07/26/2021    8:09 AM 07/06/2021    1:43 PM 02/02/2021    1:27 PM 11/18/2020    1:04 PM  Depression screen PHQ 2/9  Decreased Interest 0 0 0 0 0  Down, Depressed, Hopeless 0 0 0  0  PHQ - 2 Score 0 0 0 0 0  Altered sleeping  0     Tired, decreased energy  3     Change in appetite  0     Feeling bad or failure about yourself   0     Trouble concentrating  0     Moving slowly or fidgety/restless  0     Suicidal thoughts  0     PHQ-9 Score  3       Allergies  Allergen Reactions   Ivp Dye [Iodinated Contrast Media] Hives and Shortness Of Breath    Did ok in OP setting with 13 hr Premedications    Latanoprost    Lisinopril Cough   Onion Rash    Rash and GI upset   Sulfa Antibiotics Hives   Social History   Social History Narrative   Marital status/children/pets: Married.   Education/employment: Employed as an Glass blower/designer, high school Writer.    Safety:      -smoke alarm in the home:Yes     - wears seatbelt: Yes     - Feels safe in their relationships: Yes   Past Medical History:  Diagnosis Date   Angio-edema    Chest discomfort    , Atypical   Chicken pox    COVID-19 05/09/2019   Diabetic acidosis, type II (HCC)    Diverticulitis - large vs small bowel vs both?    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  "Normal "performed by Dr. Ilda Foil   COLONOSCOPY  04/03/2019   LAPAROTOMY N/A 11/05/2013   Procedure: EXPLORATORY LAPAROTOMY;  Surgeon: Gwenyth Ober, MD;  Location: [place];  Service: General;  Laterality: N/A;   Lexiscan  2010   Patient reports normal Lexiscan completed around 2010 by Dr. Ilda Foil.   TOTAL ABDOMINAL HYSTERECTOMY  2004   Family History  Problem Relation Age of Onset   Heart disease Mother    Heart attack Mother    Asthma Mother    Early death Mother    Miscarriages / Stillbirths Mother    Heart disease Father    Lung cancer Father    Heart attack Brother    Asthma Brother    Heart disease Brother    Kidney disease Brother    Diverticulitis Sister    Heart disease Brother    Heart attack Brother    Heart disease Brother    Heart attack Brother    Early death Maternal Grandmother    Tuberculosis Maternal Grandmother    Depression Maternal Grandfather    Heart disease Paternal Grandmother    Heart disease Sister    Colon cancer Neg Hx    Esophageal cancer Neg Hx    Rectal cancer Neg Hx    Stomach cancer Neg Hx    Allergies as  of 06/27/2022       Reactions   Ivp Dye [iodinated Contrast Media] Hives, Shortness Of Breath   Did ok in OP setting with 13 hr Premedications    Latanoprost    Lisinopril Cough   Onion Rash   Rash and GI upset   Sulfa Antibiotics Hives        Medication List        Accurate as of June 27, 2022  2:53 PM. If you have any questions, ask your nurse or doctor.          STOP taking these medications    Fluad Quadrivalent 0.5 ML injection Generic drug: influenza vaccine adjuvanted Stopped by: Howard Pouch, DO       TAKE these medications    albuterol 108 (90 Base) MCG/ACT inhaler Commonly known as: VENTOLIN HFA Inhale 2 puffs into the lungs every 6 (six) hours as needed for wheezing or shortness of breath. Started by: Howard Pouch, DO   aspirin 81 MG tablet Take 81 mg by mouth at bedtime.   benzonatate 200 MG capsule Commonly known as: TESSALON Take 1 capsule (200 mg total) by mouth 3 (three) times daily as needed for cough. Started by: Howard Pouch, DO   candesartan-hydrochlorothiazide 16-12.5 MG tablet Commonly known as: ATACAND HCT Take 1 tablet by mouth daily.   cetirizine 10 MG tablet Commonly known as: ZYRTEC Take 10 mg by mouth at bedtime.   citalopram 20 MG tablet Commonly known as: CELEXA Take 1 tablet (20 mg total) by mouth daily.   doxycycline 100 MG tablet Commonly known as: VIBRA-TABS Take 1 tablet (100 mg total) by mouth 2 (two) times daily. Started by: Howard Pouch, DO   Ferrex 150 150 MG capsule Generic drug: iron polysaccharides Take 1 capsule (150 mg total) by mouth daily.   fluocinonide ointment 0.05 % Commonly known as: LIDEX Apply 1 Application topically 2 (two) times daily.   linagliptin 5 MG Tabs tablet Commonly known as: TRADJENTA Take 1 tablet (5 mg total) by mouth daily.   meloxicam 15 MG tablet Commonly known as: MOBIC Take 1 tablet (15 mg total) by mouth daily.   metFORMIN 750 MG 24 hr tablet Commonly known as:  GLUCOPHAGE-XR Take 1 tablet (750 mg total) by mouth daily with breakfast.   metoprolol succinate 25 MG 24 hr tablet Commonly known as: TOPROL-XL Take 1 tablet (25 mg total) by mouth daily. Take with or immediately following a meal.   omeprazole 20 MG capsule Commonly known as: PRILOSEC Take 1 capsule (20 mg total) by mouth daily.   OneTouch Delica Plus 0000000 Misc check blood sugar TWICE DAILY   OneTouch Ultra test strip Generic drug: glucose blood test sugars TWICE DAILY as directed   PROBIOTIC BLEND PO Take 2 capsules by mouth daily.   rosuvastatin 40 MG tablet Commonly known as: CRESTOR Take 1 tablet (40 mg total) by mouth at bedtime.   timolol 0.25 % ophthalmic solution Commonly known as: TIMOPTIC Place 1 drop into the left eye 2 (two) times daily.   Travoprost (BAK Free) 0.004 % Soln ophthalmic solution Commonly known as: TRAVATAN Place 1 drop into both eyes at bedtime.   vitamin C 1000 MG tablet Take 1,000 mg by mouth daily.        All past medical history, surgical history, allergies, family history, immunizations andmedications were updated in the EMR today and reviewed under the history and medication portions of their EMR.     Review of Systems  Constitutional:  Positive for fever and malaise/fatigue. Negative for chills.  HENT:  Negative for congestion, ear discharge, ear pain, sinus pain and sore throat.   Eyes: Negative.   Respiratory:  Positive for cough and wheezing. Negative for sputum production and shortness of breath.   Cardiovascular: Negative.   Gastrointestinal:  Negative for abdominal pain, constipation, diarrhea, nausea and vomiting.  Genitourinary: Negative.   Musculoskeletal:  Negative for myalgias.  Skin:  Negative for rash.  Neurological:  Positive for headaches. Negative for dizziness.   Negative, with the exception of above mentioned in HPI   Objective:  BP 113/66   Pulse 66   Temp 98 F (36.7 C)   Wt 192 lb 6.4 oz (87.3  kg)   SpO2 95%   BMI 34.08 kg/m  Body mass index is 34.08 kg/m. Physical Exam Vitals and nursing note reviewed.  Constitutional:      General: She is not in acute distress.    Appearance: Normal appearance. She is normal weight. She is not ill-appearing or toxic-appearing.  HENT:     Head: Normocephalic and atraumatic.     Nose: No congestion or rhinorrhea.     Mouth/Throat:     Mouth: Mucous membranes are moist.     Pharynx: No oropharyngeal exudate or posterior oropharyngeal erythema.  Eyes:     General: No scleral icterus.       Right eye: No discharge.        Left eye: No discharge.     Extraocular Movements: Extraocular movements intact.     Conjunctiva/sclera: Conjunctivae normal.     Pupils: Pupils are equal, round, and reactive to light.  Cardiovascular:     Rate and Rhythm: Normal rate and regular rhythm.  Pulmonary:     Effort: No respiratory distress.     Breath sounds: Decreased air movement present. Wheezing and rhonchi present.  Musculoskeletal:     Cervical back: Neck supple. No tenderness.     Right lower leg: No edema.     Left lower leg: Edema present.  Lymphadenopathy:     Cervical: No cervical adenopathy.  Skin:    Findings: No rash.  Neurological:     Mental Status: She is alert  and oriented to person, place, and time. Mental status is at baseline.     Motor: No weakness.     Coordination: Coordination normal.     Gait: Gait normal.  Psychiatric:        Mood and Affect: Mood normal.        Behavior: Behavior normal.        Thought Content: Thought content normal.        Judgment: Judgment normal.      No results found. No results found. Results for orders placed or performed in visit on 06/27/22 (from the past 24 hour(s))  POC COVID-19 BinaxNow     Status: None   Collection Time: 06/27/22  2:39 PM  Result Value Ref Range   SARS Coronavirus 2 Ag Negative Negative  POCT Influenza A/B     Status: None   Collection Time: 06/27/22  2:39 PM   Result Value Ref Range   Influenza A, POC Negative Negative   Influenza B, POC Negative Negative    Assessment/Plan: Carol [name] is a 67 y.o. female present for OV for  Acute cough - POC COVID-19 BinaxNow> negative - POCT Influenza A/B> Negative Bronchial pneumonia Rest, hydrate.  mucinex (DM if cough), nettie pot or nasal saline.  Doxy BID prescribed, take until completed.  Tessalon perles for cough Albuterol q 6hr  prn Duoneb tx provided today for wheezing and decrease airflow> pt felt improvement.  IM depo medrol 80 mg provided today If cough present it can last up to 6-8 weeks.  F/U 2 weeks if not improved.    Reviewed expectations re: course of current medical issues. Discussed self-management of symptoms. Outlined signs and symptoms indicating need for more acute intervention. Patient verbalized understanding and all questions were answered. Patient received an After-Visit Summary.    Orders Placed This Encounter  Procedures   POC COVID-19 BinaxNow   POCT Influenza A/B   Meds ordered this encounter  Medications   doxycycline (VIBRA-TABS) 100 MG tablet    Sig: Take 1 tablet (100 mg total) by mouth 2 (two) times daily.    Dispense:  20 tablet    Refill:  0   benzonatate (TESSALON) 200 MG capsule    Sig: Take 1 capsule (200 mg total) by mouth 3 (three) times daily as needed for cough.    Dispense:  30 capsule    Refill:  0   albuterol (VENTOLIN HFA) 108 (90 Base) MCG/ACT inhaler    Sig: Inhale 2 puffs into the lungs every 6 (six) hours as needed for wheezing or shortness of breath.    Dispense:  8 g    Refill:  0   methylPREDNISolone acetate (DEPO-MEDROL) injection 80 mg   Referral Orders  No referral(s) requested today     Note is dictated utilizing voice recognition software. Although note has been proof read prior to signing, occasional typographical errors still can be missed. If any questions arise, please do not hesitate to call for verification.    electronically signed by:  Howard Pouch, DO  [place]

## 2022-06-28 MED ORDER — IPRATROPIUM-ALBUTEROL 0.5-2.5 (3) MG/3ML IN SOLN
3.0000 mL | RESPIRATORY_TRACT | Status: AC
  Administered 2022-06-27: 3 mL via RESPIRATORY_TRACT

## 2022-06-28 NOTE — Addendum Note (Signed)
Addended by: Beatrix Fetters on: 06/28/2022 08:21 AM   Modules accepted: Orders

## 2022-07-13 ENCOUNTER — Ambulatory Visit (INDEPENDENT_AMBULATORY_CARE_PROVIDER_SITE_OTHER): Payer: Medicare Other

## 2022-07-13 ENCOUNTER — Other Ambulatory Visit: Payer: Self-pay | Admitting: Family Medicine

## 2022-07-13 VITALS — BP 114/74 | HR 55 | Temp 98.4°F | Ht 63.0 in | Wt 192.4 lb

## 2022-07-13 DIAGNOSIS — Z0000 Encounter for general adult medical examination without abnormal findings: Secondary | ICD-10-CM | POA: Diagnosis not present

## 2022-07-13 NOTE — Patient Instructions (Signed)

## 2022-07-13 NOTE — Progress Notes (Signed)
Subjective:   Carol [name] is a 67 y.o. female who presents for Medicare Annual (Subsequent) preventive examination.  Review of Systems    Defer to PCP       Objective:    Today's Vitals   07/13/22 0925  BP: 114/74  Pulse: (!) 55  Temp: 98.4 F (36.9 C)  SpO2: 95%  Weight: 192 lb 6.4 oz (87.3 kg)  Height: 5\' 3"  (1.6 m)   Body mass index is 34.08 kg/m.     07/13/2022    9:38 AM 03/21/2022    4:39 PM 07/06/2021    1:43 PM 02/11/2019    6:00 PM 11/01/2013    6:00 AM  Advanced Directives  Does Patient Have a Medical Advance Directive? Yes No Yes No Patient does not have advance directive;Patient would not like information  Type of Scientist, forensic Power of [place];Living will  Living will;Healthcare Power of Attorney    Does patient want to make changes to medical advance directive?   No - Patient declined    Would patient like information on creating a medical advance directive?  No - Patient declined  No - Patient declined   Pre-existing out of facility DNR order (yellow form or pink MOST form)     No    Current Medications (verified) Outpatient Encounter Medications as of 07/13/2022  Medication Sig   albuterol (VENTOLIN HFA) 108 (90 Base) MCG/ACT inhaler Inhale 2 puffs into the lungs every 6 (six) hours as needed for wheezing or shortness of breath.   Ascorbic Acid (VITAMIN C) 1000 MG tablet Take 1,000 mg by mouth daily.   aspirin 81 MG tablet Take 81 mg by mouth at bedtime.    candesartan-hydrochlorothiazide (ATACAND HCT) 16-12.5 MG tablet Take 1 tablet by mouth daily.   cetirizine (ZYRTEC) 10 MG tablet Take 10 mg by mouth at bedtime.    citalopram (CELEXA) 20 MG tablet Take 1 tablet (20 mg total) by mouth daily.   FERREX 150 150 MG capsule Take 1 capsule (150 mg total) by mouth daily.   fluocinonide ointment (LIDEX) [id number] % Apply 1 Application topically 2 (two) times daily.   glucose blood (ONETOUCH ULTRA) test strip test sugars TWICE DAILY as directed    Lancets (ONETOUCH DELICA PLUS 123XX123) MISC check blood sugar TWICE DAILY   linagliptin (TRADJENTA) 5 MG TABS tablet Take 1 tablet (5 mg total) by mouth daily.   metFORMIN (GLUCOPHAGE-XR) 750 MG 24 hr tablet Take 1 tablet (750 mg total) by mouth daily with breakfast.   metoprolol succinate (TOPROL-XL) 25 MG 24 hr tablet Take 1 tablet (25 mg total) by mouth daily. Take with or immediately following a meal.   omeprazole (PRILOSEC) 20 MG capsule Take 1 capsule (20 mg total) by mouth daily.   Probiotic Product (PROBIOTIC BLEND PO) Take 2 capsules by mouth daily.   rosuvastatin (CRESTOR) 40 MG tablet Take 1 tablet (40 mg total) by mouth at bedtime.   timolol (TIMOPTIC) 0.25 % ophthalmic solution Place 1 drop into the left eye 2 (two) times daily.   Travoprost, BAK Free, (TRAVATAN) 0.004 % SOLN ophthalmic solution Place 1 drop into both eyes at bedtime.   [DISCONTINUED] benzonatate (TESSALON) 200 MG capsule Take 1 capsule (200 mg total) by mouth 3 (three) times daily as needed for cough.   [DISCONTINUED] doxycycline (VIBRA-TABS) 100 MG tablet Take 1 tablet (100 mg total) by mouth 2 (two) times daily.   [DISCONTINUED] meloxicam (MOBIC) 15 MG tablet Take 1 tablet (15 mg  total) by mouth daily.   No facility-administered encounter medications on file as of 07/13/2022.    Allergies (verified) Ivp dye [iodinated contrast media], Latanoprost, Lisinopril, Onion, and Sulfa antibiotics   History: Past Medical History:  Diagnosis Date   Angio-edema    Chest discomfort    , Atypical   Chicken pox    COVID-19 05/09/2019   Diabetic acidosis, type II    Diverticulitis - large vs small bowel vs both?    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  "Normal "performed by Dr. Ilda Foil   COLONOSCOPY  04/03/2019   LAPAROTOMY N/A 11/05/2013   Procedure: EXPLORATORY LAPAROTOMY;  Surgeon: Gwenyth Ober, MD;  Location: [place];  Service: General;  Laterality: N/A;   Lexiscan  2010   Patient reports normal Lexiscan completed around 2010 by Dr. Ilda Foil.   TOTAL ABDOMINAL HYSTERECTOMY  2004   Family History  Problem Relation Age of Onset   Heart disease Mother    Heart attack Mother    Asthma Mother    Early death Mother    Miscarriages / Stillbirths Mother    Heart disease Father    Lung cancer Father    Heart attack Brother    Asthma Brother    Heart disease Brother    Kidney disease Brother    Diverticulitis Sister    Heart disease Brother    Heart attack Brother    Heart disease Brother    Heart attack Brother    Early death Maternal Grandmother    Tuberculosis Maternal Grandmother    Depression Maternal Grandfather    Heart disease Paternal Grandmother    Heart disease Sister    Colon cancer Neg Hx    Esophageal cancer Neg Hx    Rectal cancer Neg Hx    Stomach cancer Neg Hx    Social History   Socioeconomic History   Marital status: Married    Spouse name: Not on file   Number of children: Not on file   Years of education: Not on file   Highest education level: Not on file  Occupational History   Not on file  Tobacco Use   Smoking status: Never    Passive exposure: Never   Smokeless tobacco: Never  Vaping Use   Vaping Use: Never used  Substance and Sexual Activity   Alcohol use: Not Currently    Comment: Occassional drinker   Drug use: No   Sexual activity: Not Currently  Other Topics Concern   Not on file  Social History Narrative   Marital status/children/pets: Married.   Education/employment: Employed  as an Glass blower/designer, high school Writer.   Safety:      -smoke alarm in the home:Yes     - wears seatbelt: Yes     - Feels safe in their relationships: Yes   Social Determinants of Health   Financial Resource Strain: Low Risk  (07/13/2022)   Overall Financial Resource Strain (CARDIA)    Difficulty of Paying  Living Expenses: Not hard at all  Food Insecurity: No Food Insecurity (07/13/2022)   Hunger Vital Sign    Worried About Running Out of Food in the Last Year: Never true    Ran Out of Food in the Last Year: Never true  Transportation Needs: No Transportation Needs (07/13/2022)   PRAPARE - Hydrologist (Medical): No    Lack of Transportation (Non-Medical): No  Physical Activity: Inactive (07/13/2022)   Exercise Vital Sign    Days of Exercise per Week: 0 days    Minutes of Exercise per Session: 0 min  Stress: No Stress Concern Present (07/13/2022)   [place]    Feeling of Stress : Not at all  Social Connections: Unknown (07/13/2022)   Social Connection and Isolation Panel [NHANES]    Frequency of Communication with Friends and Family: More than three times a week    Frequency of Social Gatherings with Friends and Family: Twice a week    Attends Religious Services: More than 4 times per year    Active Member of [employer] or Organizations: Not on file    Attends Music therapist: Not on file    Marital Status: Married    Tobacco Counseling Counseling given: Not Answered   Clinical Intake:  Pre-visit preparation completed: Yes  Pain : No/denies pain     Nutritional Risks: None Diabetes: Yes CBG done?: Yes (137) CBG resulted in Enter/ Edit results?: No Did pt. bring in CBG monitor from home?: No  How often do you need to have someone help you when you read instructions, pamphlets, or other written materials from your doctor or pharmacy?: 1 - Never What is the last grade  level you completed in school?: 12th grade  Diabetic?yes  Interpreter Needed?: No  Information entered by :: Franchelle Foskett J,CMA   Activities of Daily Living    07/13/2022    9:39 AM  In your present state of health, do you have any difficulty performing the following activities:  Hearing? 0  Vision? 0  Difficulty concentrating or making decisions? 0  Walking or climbing stairs? 0  Dressing or bathing? 0  Doing errands, shopping? 0  Preparing Food and eating ? N  Using the Toilet? N  In the past six months, have you accidently leaked urine? Y  Do you have problems with loss of bowel control? N  Managing your Medications? N  Managing your Finances? N  Housekeeping or managing your Housekeeping? N    Patient Care Team: Ma Hillock, DO as PCP - General (Family Medicine) Jerline Pain, MD as Consulting Physician (Cardiology) Gatha Mayer, MD as Consulting Physician (Gastroenterology) Danice Goltz, MD as Consulting Physician (Ophthalmology) Sonda Primes, MD as Referring Physician (Obstetrics and Gynecology) Neldon Mc Donnamarie Poag, MD as Consulting Physician (Allergy and Immunology) [facility] (Podiatry)  Indicate any recent Medical Services you may have received from other than [facility] providers in the past year (date may be approximate).     Assessment:   This is a routine wellness examination for [place].  Hearing/Vision screen No results found.  Dietary issues and exercise activities discussed: Current Exercise Habits: The patient does not participate in regular exercise at present, Exercise limited by: None identified   Goals Addressed   None   Depression Screen    07/13/2022    9:33 AM 01/10/2022    8:25 AM 07/26/2021    8:09 AM  07/06/2021    1:43 PM 02/02/2021    1:27 PM 11/18/2020    1:04 PM 06/29/2020   10:41 AM  PHQ 2/9 Scores  PHQ - 2 Score 0 0 0 0 0 0 1  PHQ- 9 Score   3    5    Fall Risk    07/13/2022    9:39 AM 01/10/2022     8:25 AM 11/05/2021    4:35 PM 02/02/2021    1:26 PM 05/25/2020    2:26 PM  Fall Risk   Falls in the past year? 0 1 1 1  0  Number falls in past yr: 0 0 0 0 0  Injury with Fall? 0 0 0 1 0  Risk for fall due to : No Fall Risks No Fall Risks No Fall Risks No Fall Risks   Follow up Falls evaluation completed Falls evaluation completed Falls evaluation completed Falls evaluation completed     [place]:  Any stairs in or around the home? No  If so, are there any without handrails? No  Home free of loose throw rugs in walkways, pet beds, electrical cords, etc? Yes  Adequate lighting in your home to reduce risk of falls? Yes   ASSISTIVE DEVICES UTILIZED TO PREVENT FALLS:  Life alert? No  Use of a cane, walker or w/c? No  Grab bars in the bathroom? No  Shower chair or bench in shower? No  Elevated toilet seat or a handicapped toilet? Yes   TIMED UP AND GO:  Was the test performed? Yes .  Length of time to ambulate 10 feet: 5 sec.   Gait steady and fast without use of assistive device  Cognitive Function:        07/13/2022    9:40 AM 07/06/2021    1:43 PM  6CIT Screen  What Year? 0 points 0 points  What month? 0 points 0 points  What time? 0 points 0 points  Count back from 20 0 points 0 points  Months in reverse 0 points 0 points  Repeat phrase 0 points 0 points  Total Score 0 points 0 points    Immunizations Immunization History  Administered Date(s) Administered   Fluad Quad(high Dose 65+) 02/21/2020, 01/06/2021, 03/02/2022   Influenza,inj,Quad PF,6+ Mos 03/30/2019   PFIZER(Purple Top)SARS-COV-2 Vaccination 07/02/2019, 07/30/2019, 01/19/2020   Pfizer Covid-19 Vaccine Bivalent Booster 64yrs & up 02/12/2021   Pneumococcal Conjugate-13 03/11/2020   Pneumococcal Polysaccharide-23 03/24/2021   Tdap 08/27/2012   Zoster Recombinat (Shingrix) 11/05/2019, 02/03/2020    TDAP status: Up to date  Flu Vaccine status: Up to  date  Pneumococcal vaccine status: Up to date  Covid-19 vaccine status: Information provided on how to obtain vaccines.   Qualifies for Shingles Vaccine? Yes   Zostavax completed  N/A   Shingrix Completed?: Yes  Screening Tests Health Maintenance  Topic Date Due   OPHTHALMOLOGY EXAM  04/11/2022   HEMOGLOBIN A1C  07/12/2022   FOOT EXAM  07/27/2022   DTaP/Tdap/Td (2 - Td or Tdap) 08/28/2022   INFLUENZA VACCINE  11/10/2022   Diabetic kidney evaluation - Urine ACR  01/11/2023   Diabetic kidney evaluation - eGFR measurement  03/22/2023   MAMMOGRAM  06/25/2023   Medicare Annual Wellness (AWV)  07/13/2023   COLONOSCOPY (Pts 45-6yrs Insurance coverage will need to be confirmed)  04/02/2029   DEXA SCAN  03/18/2030   Pneumonia Vaccine 41+ Years old  Completed   Hepatitis C Screening  Completed  Zoster Vaccines- Shingrix  Completed   HPV VACCINES  Aged Out   COVID-19 Vaccine  Discontinued    Health Maintenance  Health Maintenance Due  Topic Date Due   OPHTHALMOLOGY EXAM  04/11/2022   HEMOGLOBIN A1C  07/12/2022    Colorectal cancer screening: Type of screening: Colonoscopy. Completed 04/03/2019. Repeat every 10 years  Mammogram status: Completed 06/24/2021. Repeat every year  Bone Density status: Completed 03/18/2020. Results reflect: Bone density results: NORMAL. Repeat every 2 years.  Lung Cancer Screening: (Low Dose CT Chest recommended if Age 32-80 years, 30 pack-year currently smoking OR have quit w/in 15years.) does not qualify.   Lung Cancer Screening Referral: N/A  Additional Screening:  Hepatitis C Screening: does qualify; Completed 11/05/2019  Vision Screening: Recommended annual ophthalmology exams for early detection of glaucoma and other disorders of the eye. Is the patient up to date with their annual eye exam?  Yes  Who is the provider or what is the name of the office in which the patient attends annual eye exams? Dr. Manuella Ghazi If pt is not established with a  provider, would they like to be referred to a provider to establish care?  N/A .   Dental Screening: Recommended annual dental exams for proper oral hygiene  Community Resource Referral / Chronic Care Management: CRR required this visit?  No   CCM required this visit?  No      Plan:     I have personally reviewed and noted the following in the patient's chart:   Medical and social history Use of alcohol, tobacco or illicit drugs  Current medications and supplements including opioid prescriptions. Patient is not currently taking opioid prescriptions. Functional ability and status Nutritional status Physical activity Advanced directives List of other physicians Hospitalizations, surgeries, and ER visits in previous 12 months Vitals Screenings to include cognitive, depression, and falls Referrals and appointments  In addition, I have reviewed and discussed with patient certain preventive protocols, quality metrics, and best practice recommendations. A written personalized care plan for preventive services as well as general preventive health recommendations were provided to patient.     Edgar Frisk, [facility]   07/13/2022   Nurse Notes: Patient had face to face 30 minute visit.    Ms. Grismore , Thank you for taking time to come for your Medicare Wellness Visit. I appreciate your ongoing commitment to your health goals. Please review the following plan we discussed and let me know if I can assist you in the future.   These are the goals we discussed:  Goals   None     This is a list of the screening recommended for you and due dates:  Health Maintenance  Topic Date Due   Eye exam for diabetics  04/11/2022   Hemoglobin A1C  07/12/2022   Complete foot exam   07/27/2022   DTaP/Tdap/Td vaccine (2 - Td or Tdap) 08/28/2022   Flu Shot  11/10/2022   Yearly kidney health urinalysis for diabetes  01/11/2023   Yearly kidney function blood test for diabetes  03/22/2023   Mammogram   06/25/2023   Medicare Annual Wellness Visit  07/13/2023   Colon Cancer Screening  04/02/2029   DEXA scan (bone density measurement)  03/18/2030   Pneumonia Vaccine  Completed   Hepatitis C Screening: USPSTF Recommendation to screen - Ages 71-79 yo.  Completed   Zoster (Shingles) Vaccine  Completed   HPV Vaccine  Aged Out   COVID-19 Vaccine  Discontinued

## 2022-07-29 DIAGNOSIS — M1812 Unilateral primary osteoarthritis of first carpometacarpal joint, left hand: Secondary | ICD-10-CM | POA: Diagnosis not present

## 2022-07-29 DIAGNOSIS — M654 Radial styloid tenosynovitis [de Quervain]: Secondary | ICD-10-CM | POA: Diagnosis not present

## 2022-08-02 ENCOUNTER — Encounter: Payer: Self-pay | Admitting: Family Medicine

## 2022-08-02 ENCOUNTER — Ambulatory Visit (INDEPENDENT_AMBULATORY_CARE_PROVIDER_SITE_OTHER): Payer: Medicare Other | Admitting: Family Medicine

## 2022-08-02 ENCOUNTER — Telehealth: Payer: Self-pay | Admitting: Family Medicine

## 2022-08-02 ENCOUNTER — Ambulatory Visit (HOSPITAL_BASED_OUTPATIENT_CLINIC_OR_DEPARTMENT_OTHER)
Admission: RE | Admit: 2022-08-02 | Discharge: 2022-08-02 | Disposition: A | Discharge status: Home / Self Care | Payer: Medicare Other | Source: Ambulatory Visit | Attending: Family Medicine | Admitting: Family Medicine

## 2022-08-02 VITALS — BP 110/68 | HR 72 | Temp 98.4°F | Wt 192.2 lb

## 2022-08-02 DIAGNOSIS — M25552 Pain in left hip: Secondary | ICD-10-CM | POA: Insufficient documentation

## 2022-08-02 DIAGNOSIS — Y929 Unspecified place or not applicable: Secondary | ICD-10-CM | POA: Diagnosis not present

## 2022-08-02 DIAGNOSIS — W19XXXA Unspecified fall, initial encounter: Secondary | ICD-10-CM | POA: Insufficient documentation

## 2022-08-02 DIAGNOSIS — M7918 Myalgia, other site: Secondary | ICD-10-CM

## 2022-08-02 DIAGNOSIS — Y939 Activity, unspecified: Secondary | ICD-10-CM | POA: Diagnosis not present

## 2022-08-02 MED ORDER — HYDROCODONE-ACETAMINOPHEN 5-325 MG PO TABS: 1.0000 | ORAL_TABLET | Freq: Four times a day (QID) | ORAL | 0 refills | Status: DC | PRN

## 2022-08-02 MED ORDER — NAPROXEN 500 MG PO TABS
500.0000 mg | ORAL_TABLET | Freq: Two times a day (BID) | ORAL | 0 refills | Status: DC
Start: 2022-08-02 — End: 2022-09-02

## 2022-08-02 MED ORDER — KETOROLAC TROMETHAMINE 60 MG/2ML IM SOLN
60.0000 mg | Freq: Once | INTRAMUSCULAR | Status: AC
Start: 2022-08-02 — End: 2022-08-02
  Administered 2022-08-02: 60 mg via INTRAMUSCULAR

## 2022-08-02 NOTE — Telephone Encounter (Signed)
Please call pt Her xrays are normal. No fractures present.  Start rest, heat application and the anti-inflammatories as we discussed. Vicodin prescribed if needed for more severe pain. Follow-up in 1 week, sooner if needed.  If still having discomfort next step would be CT or MRI

## 2022-08-02 NOTE — Progress Notes (Signed)
Carol [name] , [birth year], 67 y.o., female MRN: [id number] Patient Care Team    Relationship Specialty Notifications Start End  Natalia Leatherwood, DO PCP - General Family Medicine  06/10/19   Jake Bathe, MD Consulting Physician Cardiology  06/12/19   Iva Boop, MD Consulting Physician Gastroenterology  06/12/19   Elwin Mocha, MD Consulting Physician Ophthalmology  06/12/19   Frankey Poot, MD Referring Physician Obstetrics and Gynecology  06/12/19   Jessica Priest, MD Consulting Physician Allergy and Immunology  06/12/19   Specialists, [facility]  07/26/21     Chief Complaint  Patient presents with   Leg Pain    Taking advil; last dose 4 AM; Okay with [facility] or [facility]   Fall    Last night about 8-830; left hip; tripped and fell to floor      Subjective: Carol [name] is a 67 y.o. Pt presents for an OV with complaints of left leg pain of 1 day duration.   Patient reports she was cleaning out her sewing room and tripped over something, landing on her left lateral hip location and buttock.  She reports that she had instant pain.  She reports pain has remained in the left buttock area radiates down the posterior aspect of her leg.  She has bony tenderness on her left lateral hip/femur area and reports pain will radiate down to her foot. Pt has tried Advil/Tylenol to ease their symptoms.  She states she is able to lift her knee/leg up such as in a flexion position.  However moving her legs side-to-side can cause great discomfort and radiating pain all the way down to her foot.     08/02/2022    2:23 PM 07/13/2022    9:33 AM 01/10/2022    8:25 AM 07/26/2021    8:09 AM 07/06/2021    1:43 PM  Depression screen PHQ 2/9  Decreased Interest 0 0 0 0 0  Down, Depressed, Hopeless 0 0 0 0 0  PHQ - 2 Score 0 0 0 0 0  Altered sleeping    0   Tired, decreased energy    3   Change in appetite    0   Feeling bad or failure about yourself     0    Trouble concentrating    0   Moving slowly or fidgety/restless    0   Suicidal thoughts    0   PHQ-9 Score    3     Allergies  Allergen Reactions   Ivp Dye [Iodinated Contrast Media] Hives and Shortness Of Breath    Did ok in OP setting with 13 hr Premedications    Latanoprost    Lisinopril Cough   Onion Rash    Rash and GI upset   Sulfa Antibiotics Hives   Social History   Social History Narrative   Marital status/children/pets: Married.   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   Safety:      -smoke alarm in the home:Yes     - wears seatbelt: Yes     - Feels safe in their relationships: Yes   Past Medical History:  Diagnosis Date   Angio-edema    Chest discomfort    , Atypical   Chicken pox    COVID-19 05/09/2019   Diabetic acidosis, type II    Diverticulitis - large vs small bowel vs both?    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  "Normal "performed by Dr. Ty Hilts   COLONOSCOPY  04/03/2019   LAPAROTOMY N/A 11/05/2013   Procedure: EXPLORATORY LAPAROTOMY;  Surgeon: Cherylynn Ridges, MD;  Location: [facility] OR;  Service: General;  Laterality: N/A;   Lexiscan  2010   Patient reports normal Lexiscan completed around 2010 by Dr. Ty Hilts.   TOTAL ABDOMINAL HYSTERECTOMY  2004   Family History  Problem Relation Age of Onset   Heart disease Mother    Heart attack Mother    Asthma Mother    Early death Mother    Miscarriages / Stillbirths Mother    Heart disease Father     Lung cancer Father    Heart attack Brother    Asthma Brother    Heart disease Brother    Kidney disease Brother    Diverticulitis Sister    Heart disease Brother    Heart attack Brother    Heart disease Brother    Heart attack Brother    Early death Maternal Grandmother    Tuberculosis Maternal Grandmother    Depression Maternal Grandfather    Heart disease Paternal Grandmother    Heart disease Sister    Colon cancer Neg Hx    Esophageal cancer Neg Hx    Rectal cancer Neg Hx    Stomach cancer Neg Hx    Allergies as of 08/02/2022       Reactions   Ivp Dye [iodinated Contrast Media] Hives, Shortness Of Breath   Did ok in OP setting with 13 hr Premedications    Latanoprost    Lisinopril Cough   Onion Rash   Rash and GI upset   Sulfa Antibiotics Hives        Medication List        Accurate as of August 02, 2022  2:52 PM. If you have any questions, ask your nurse or doctor.          albuterol 108 (90 Base) MCG/ACT inhaler Commonly known as: VENTOLIN HFA Inhale 2 puffs into the lungs every 6 (six) hours as needed for wheezing or shortness of breath.   aspirin 81 MG tablet Take 81 mg by mouth at bedtime.   candesartan-hydrochlorothiazide 16-12.5 MG tablet Commonly known as: ATACAND HCT Take 1 tablet by mouth daily.   cetirizine 10 MG tablet Commonly known as: ZYRTEC Take 10 mg by mouth at bedtime.   citalopram 20 MG tablet Commonly known as: CELEXA Take 1 tablet (20 mg total) by mouth daily.   Ferrex 150 150 MG capsule Generic drug: iron polysaccharides Take 1 capsule (150 mg total) by mouth daily.   fluocinonide ointment 0.05 % Commonly known as: LIDEX Apply 1 Application topically 2 (two) times daily.   HYDROcodone-acetaminophen 5-325 MG tablet Commonly known as: NORCO/VICODIN Take 1 tablet by mouth every 6 (six) hours as needed for moderate pain. Started by: Felix Pacini, DO   linagliptin 5 MG Tabs tablet Commonly known as: Tradjenta Take 1  tablet (5 mg  total) by mouth daily. OFFICE VISIT NEEDED FOR FURTHER REFILLS   metFORMIN 750 MG 24 hr tablet Commonly known as: GLUCOPHAGE-XR Take 1 tablet (750 mg total) by mouth daily with breakfast.   metoprolol succinate 25 MG 24 hr tablet Commonly known as: TOPROL-XL Take 1 tablet (25 mg total) by mouth daily. Take with or immediately following a meal. OFFICE VISIT NEEDED FOR FURTHER REFILLS   naproxen 500 MG tablet Commonly known as: Naprosyn Take 1 tablet (500 mg total) by mouth 2 (two) times daily with a meal. Started by: Felix Pacini, DO   omeprazole 20 MG capsule Commonly known as: PRILOSEC Take 1 capsule (20 mg total) by mouth daily.   OneTouch Delica Plus Lancet33G Misc check blood sugar TWICE DAILY   OneTouch Ultra test strip Generic drug: glucose blood test sugars TWICE DAILY as directed   PROBIOTIC BLEND PO Take 2 capsules by mouth daily.   rosuvastatin 40 MG tablet Commonly known as: CRESTOR Take 1 tablet (40 mg total) by mouth at bedtime.   timolol 0.25 % ophthalmic solution Commonly known as: TIMOPTIC Place 1 drop into the left eye 2 (two) times daily.   Travoprost (BAK Free) 0.004 % Soln ophthalmic solution Commonly known as: TRAVATAN Place 1 drop into both eyes at bedtime.   vitamin C 1000 MG tablet Take 1,000 mg by mouth daily.        All past medical history, surgical history, allergies, family history, immunizations andmedications were updated in the EMR today and reviewed under the history and medication portions of their EMR.     ROS Negative, with the exception of above mentioned in HPI   Objective:  BP 110/68   Pulse 72   Temp 98.4 F (36.9 C)   Wt 192 lb 3.2 oz (87.2 kg)   SpO2 97%   BMI 34.05 kg/m  Body mass index is 34.05 kg/m.  Physical Exam Vitals and nursing note reviewed.  Constitutional:      General: She is not in acute distress.    Appearance: Normal appearance. She is normal weight. She is not ill-appearing or  toxic-appearing.     Comments: limping  HENT:     Head: Normocephalic and atraumatic.  Eyes:     General: No scleral icterus.       Right eye: No discharge.        Left eye: No discharge.     Extraocular Movements: Extraocular movements intact.     Conjunctiva/sclera: Conjunctivae normal.     Pupils: Pupils are equal, round, and reactive to light.  Musculoskeletal:        General: Tenderness and signs of injury present.     Right hip: Normal.     Left hip: Tenderness and bony tenderness present. No deformity or lacerations. Decreased range of motion. Decreased strength.       Legs:     Comments: TTP right piriformis location w/ radiation of pain posterior leg to foot.  DROM- add/Abduction of left leg Left lateral femur bone prominence TTP Bearing weight painful.  No erythema or bruising.  No sacral tenderness.   Skin:    General: Skin is warm.     Findings: No bruising or rash.  Neurological:     Mental Status: She is alert and oriented to person, place, and time. Mental status is at baseline.     Motor: No weakness.     Coordination: Coordination normal.     Gait: Gait normal.  Psychiatric:  Mood and Affect: Mood normal.        Behavior: Behavior normal.        Thought Content: Thought content normal.        Judgment: Judgment normal.      No results found. No results found. No results found for this or any previous visit (from the past 24 hour(s)).  Assessment/Plan: Carol [name] is a 67 y.o. female present for OV for  Left buttock pain/lateral left hip pain/fall Concern for femur fracture.  Suspect gluteus involvement with either a tear, strain or possibly hematoma irritating sciatic nerve. - DG HIP UNILAT W OR W/O PELVIS 2-3 VIEWS LEFT; Future - DG FEMUR MIN 2 VIEWS LEFT; Future -Toradol IM 60 administered to help with pain relief Rest/heat, since ice did not seem to help. -Start naproxen before bed twice daily with food - Vicodin 5-3 25 prescribed if  needed only, ensure to use bowel regimen/stool softener Once results are received, patient will be called and further plan and follow-up will be discussed at that time.  Reviewed expectations re: course of current medical issues. Discussed self-management of symptoms. Outlined signs and symptoms indicating need for more acute intervention. Patient verbalized understanding and all questions were answered. Patient received an After-Visit Summary.    Orders Placed This Encounter  Procedures   DG HIP UNILAT W OR W/O PELVIS 2-3 VIEWS LEFT   DG FEMUR MIN 2 VIEWS LEFT   Meds ordered this encounter  Medications   HYDROcodone-acetaminophen (NORCO/VICODIN) 5-325 MG tablet    Sig: Take 1 tablet by mouth every 6 (six) hours as needed for moderate pain.    Dispense:  30 tablet    Refill:  0   naproxen (NAPROSYN) 500 MG tablet    Sig: Take 1 tablet (500 mg total) by mouth 2 (two) times daily with a meal.    Dispense:  30 tablet    Refill:  0   Referral Orders  No referral(s) requested today     Note is dictated utilizing voice recognition software. Although note has been proof read prior to signing, occasional typographical errors still can be missed. If any questions arise, please do not hesitate to call for verification.   electronically signed by:  Felix Pacini, DO  [facility] - OR

## 2022-08-02 NOTE — Addendum Note (Signed)
Addended by: Filomena Jungling on: 08/02/2022 02:58 PM   Modules accepted: Orders

## 2022-08-02 NOTE — Telephone Encounter (Signed)
Pt advised of results and recommendations and scheduled for 1 week follow up

## 2022-08-02 NOTE — Patient Instructions (Addendum)
No follow-ups on file.  Will call you with results after received and discuss further plan and followup      Great to see you today.  I have refilled the medication(s) we provide.   If labs were collected, we will inform you of lab results once received either by echart message or telephone call.   - echart message- for normal results that have been seen by the patient already.   - telephone call: abnormal results or if patient has not viewed results in their echart.

## 2022-08-09 ENCOUNTER — Ambulatory Visit (INDEPENDENT_AMBULATORY_CARE_PROVIDER_SITE_OTHER): Payer: Medicare Other | Admitting: Family Medicine

## 2022-08-09 ENCOUNTER — Encounter: Payer: Self-pay | Admitting: Family Medicine

## 2022-08-09 VITALS — BP 118/69 | HR 51 | Temp 97.9°F | Wt 192.8 lb

## 2022-08-09 DIAGNOSIS — M7918 Myalgia, other site: Secondary | ICD-10-CM

## 2022-08-09 DIAGNOSIS — M25552 Pain in left hip: Secondary | ICD-10-CM

## 2022-08-09 DIAGNOSIS — W19XXXA Unspecified fall, initial encounter: Secondary | ICD-10-CM | POA: Diagnosis not present

## 2022-08-09 NOTE — Patient Instructions (Signed)
Follow up in 3 -4 weeks if your not seeing improvement. Start stretches and strengthening exercises in about 1-2 weeks using pain as your guide.

## 2022-08-09 NOTE — Progress Notes (Signed)
Carol [name] , Sep 08, 1955, 67 y.o., female MRN: [id number] Patient Care Team    Relationship Specialty Notifications Start End  Natalia Leatherwood, DO PCP - General Family Medicine  06/10/19   Jake Bathe, MD Consulting Physician Cardiology  06/12/19   Iva Boop, MD Consulting Physician Gastroenterology  06/12/19   Elwin Mocha, MD Consulting Physician Ophthalmology  06/12/19   Frankey Poot, MD Referring Physician Obstetrics and Gynecology  06/12/19   Jessica Priest, MD Consulting Physician Allergy and Immunology  06/12/19   Specialists, [facility]  07/26/21     Chief Complaint  Patient presents with   Leg Pain     Subjective: Carol [name] is a 67 y.o. Pt presents for an OV to follow up after fall- will leg /buttock pain. Xray of femur and hip/pelvis reassuring. Pt was treated with naproxen, Vicodin if needed.  Carol [name] reports Carol [name] feels about 85% better today than on presentation.  Carol [name] states Carol [name] still has to walk slower than Carol [name] normal or if Carol [name] steps slightly wrong, Carol [name] can feel a very sharp pain that radiates from Carol [name] lateral posterior thigh to buttock.  Carol [name] reports Carol [name] does not really know what movement is creating this, when Carol [name] feels the pain Carol [name] does not feel like Carol [name] stepped much differently than prior.  Denies any fevers, chills.  Carol [name] has not noticed any bruising.  Carol [name] still has an area of tenderness in the inferior portion of Carol [name] left buttock. Prior note: with complaints of left leg pain of 1 day duration.   Patient reports Carol [name] was cleaning out Carol [name] sewing room and tripped over something, landing on Carol [name] left lateral hip location and buttock.  Carol [name] reports that Carol [name] had instant pain.  Carol [name] reports pain has remained in the left buttock area radiates down the posterior aspect of Carol [name] leg.  Carol [name] has bony tenderness on Carol [name] left lateral hip/femur area and reports pain will radiate down to Carol [name] foot. Pt has tried Advil/Tylenol to ease their symptoms.  Carol [name] states  Carol [name] is able to lift Carol [name] knee/leg up such as in a flexion position.  However moving Carol [name] legs side-to-side can cause great discomfort and radiating pain all the way down to Carol [name] foot.     08/02/2022    2:23 PM 07/13/2022    9:33 AM 01/10/2022    8:25 AM 07/26/2021    8:09 AM 07/06/2021    1:43 PM  Depression screen PHQ 2/9  Decreased Interest 0 0 0 0 0  Down, Depressed, Hopeless 0 0 0 0 0  PHQ - 2 Score 0 0 0 0 0  Altered sleeping    0   Tired, decreased energy    3   Change in appetite    0   Feeling bad or failure about yourself     0   Trouble concentrating    0   Moving slowly or fidgety/restless    0   Suicidal thoughts    0   PHQ-9 Score    3     Allergies  Allergen Reactions   Ivp Dye [Iodinated Contrast Media] Hives and Shortness Of Breath    Did ok in OP setting with 13 hr Premedications    Latanoprost    Lisinopril Cough   Onion Rash    Rash and GI upset   Sulfa Antibiotics Hives   Social History   Social History Narrative   Marital status/children/pets:  Married.   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   Safety:      -smoke alarm in the home:Yes     - wears seatbelt: Yes     - Feels safe in their relationships: Yes   Past Medical History:  Diagnosis Date   Angio-edema    Chest discomfort    , Atypical   Chicken pox    COVID-19 05/09/2019   Diabetic acidosis, type II (HCC)    Diverticulitis - large vs small bowel vs both?    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  "Normal "performed by Dr. Ty Hilts   COLONOSCOPY  04/03/2019   LAPAROTOMY N/A 11/05/2013   Procedure: EXPLORATORY LAPAROTOMY;  Surgeon: Cherylynn Ridges, MD;  Location: [facility] OR;  Service: General;  Laterality: N/A;   Lexiscan  2010   Patient reports normal Lexiscan completed around 2010 by Dr. Ty Hilts.   TOTAL ABDOMINAL HYSTERECTOMY  2004   Family History  Problem Relation Age of Onset   Heart disease Mother    Heart attack Mother    Asthma Mother    Early death Mother    Miscarriages / Stillbirths Mother    Heart disease Father    Lung cancer Father    Heart attack Brother    Asthma Brother    Heart disease Brother    Kidney disease Brother    Diverticulitis Sister    Heart disease Brother    Heart attack Brother    Heart disease Brother    Heart attack Brother    Early death Maternal Grandmother    Tuberculosis Maternal Grandmother    Depression Maternal Grandfather    Heart disease Paternal Grandmother    Heart disease Sister    Colon cancer Neg Hx    Esophageal cancer Neg Hx    Rectal cancer Neg Hx    Stomach cancer Neg Hx    Allergies as of 08/09/2022       Reactions   Ivp Dye [iodinated Contrast Media] Hives, Shortness Of Breath   Did ok in OP setting with 13 hr Premedications    Latanoprost    Lisinopril Cough   Onion Rash   Rash and GI upset   Sulfa Antibiotics Hives        Medication List        Accurate as of August 09, 2022 10:48 AM. If you have any questions, ask your nurse or doctor.          albuterol 108 (90 Base) MCG/ACT inhaler Commonly known as: VENTOLIN HFA Inhale 2 puffs into the lungs every 6 (six) hours as needed for wheezing or shortness of breath.   aspirin 81 MG tablet Take 81 mg by mouth at bedtime.   candesartan-hydrochlorothiazide 16-12.5 MG tablet Commonly known as: ATACAND HCT Take 1 tablet by mouth daily.   cetirizine 10 MG tablet Commonly known as: ZYRTEC Take 10 mg by mouth at bedtime.   citalopram 20 MG  tablet Commonly  known as: CELEXA Take 1 tablet (20 mg total) by mouth daily.   Ferrex 150 150 MG capsule Generic drug: iron polysaccharides Take 1 capsule (150 mg total) by mouth daily.   fluocinonide ointment 0.05 % Commonly known as: LIDEX Apply 1 Application topically 2 (two) times daily.   HYDROcodone-acetaminophen 5-325 MG tablet Commonly known as: NORCO/VICODIN Take 1 tablet by mouth every 6 (six) hours as needed for moderate pain.   linagliptin 5 MG Tabs tablet Commonly known as: Tradjenta Take 1 tablet (5 mg total) by mouth daily. OFFICE VISIT NEEDED FOR FURTHER REFILLS   metFORMIN 750 MG 24 hr tablet Commonly known as: GLUCOPHAGE-XR Take 1 tablet (750 mg total) by mouth daily with breakfast.   metoprolol succinate 25 MG 24 hr tablet Commonly known as: TOPROL-XL Take 1 tablet (25 mg total) by mouth daily. Take with or immediately following a meal. OFFICE VISIT NEEDED FOR FURTHER REFILLS   naproxen 500 MG tablet Commonly known as: Naprosyn Take 1 tablet (500 mg total) by mouth 2 (two) times daily with a meal.   omeprazole 20 MG capsule Commonly known as: PRILOSEC Take 1 capsule (20 mg total) by mouth daily.   OneTouch Delica Plus Lancet33G Misc check blood sugar TWICE DAILY   OneTouch Ultra test strip Generic drug: glucose blood test sugars TWICE DAILY as directed   PROBIOTIC BLEND PO Take 2 capsules by mouth daily.   rosuvastatin 40 MG tablet Commonly known as: CRESTOR Take 1 tablet (40 mg total) by mouth at bedtime.   timolol 0.25 % ophthalmic solution Commonly known as: TIMOPTIC Place 1 drop into the left eye 2 (two) times daily.   Travoprost (BAK Free) 0.004 % Soln ophthalmic solution Commonly known as: TRAVATAN Place 1 drop into both eyes at bedtime.   vitamin C 1000 MG tablet Take 1,000 mg by mouth daily.        All past medical history, surgical history, allergies, family history, immunizations andmedications were updated in the EMR today  and reviewed under the history and medication portions of their EMR.     ROS Negative, with the exception of above mentioned in HPI   Objective:  BP 118/69   Pulse (!) 51   Temp 97.9 F (36.6 C)   Wt 192 lb 12.8 oz (87.5 kg)   SpO2 97%   BMI 34.15 kg/m  Body mass index is 34.15 kg/m. Physical Exam Vitals and nursing note reviewed.  Constitutional:      General: Carol [name] is not in acute distress.    Appearance: Normal appearance. Carol [name] is normal weight. Carol [name] is not ill-appearing or toxic-appearing.  HENT:     Head: Normocephalic and atraumatic.  Eyes:     General: No scleral icterus.       Right eye: No discharge.        Left eye: No discharge.     Extraocular Movements: Extraocular movements intact.     Conjunctiva/sclera: Conjunctivae normal.     Pupils: Pupils are equal, round, and reactive to light.  Musculoskeletal:        General: Tenderness and signs of injury present.     Comments: Mild tenderness inferior left buttock, left lateral hip/thigh and left distal posterior lateral thigh.  No erythema.  No bruising.  No swelling.  Discomfort with abduction and adduction of left thigh.  Skin:    Findings: No rash.  Neurological:     Mental Status: Carol [name] is alert and oriented to person, place, and time. Mental status is  at baseline.     Motor: No weakness.     Coordination: Coordination normal.     Gait: Gait normal.  Psychiatric:        Mood and Affect: Mood normal.        Behavior: Behavior normal.        Thought Content: Thought content normal.        Judgment: Judgment normal.       No results found. No results found. No results found for this or any previous visit (from the past 24 hour(s)).  Assessment/Plan: CALE DECAROLIS is a 67 y.o. female present for OV for  Left buttock pain/lateral left hip pain/fall Continue to heat.  Slowly increase activity.  If able to tolerate, using pain as Carol [name] guide, start stretches/strengthening for hamstring and gluteus provided to  Carol [name] today. Can continue naproxen as needed for pain. - DG HIP/femur WNL. No acute fractures.   If symptoms are not improving in 3 to 4 weeks, or if they are worsening, follow-up  Reviewed expectations re: course of current medical issues. Discussed self-management of symptoms. Outlined signs and symptoms indicating need for more acute intervention. Patient verbalized understanding and all questions were answered. Patient received an After-Visit Summary.    No orders of the defined types were placed in this encounter.  No orders of the defined types were placed in this encounter.  Referral Orders  No referral(s) requested today     Note is dictated utilizing voice recognition software. Although note has been proof read prior to signing, occasional typographical errors still can be missed. If any questions arise, please do not hesitate to call for verification.   electronically signed by:  Felix Pacini, DO  [facility] - [place]

## 2022-08-22 ENCOUNTER — Telehealth: Payer: Self-pay | Admitting: Family Medicine

## 2022-08-22 DIAGNOSIS — N390 Urinary tract infection, site not specified: Secondary | ICD-10-CM | POA: Diagnosis not present

## 2022-08-22 NOTE — Telephone Encounter (Signed)
Pt is currently out of town in [place] and is now experiencing some UTI symptoms. I informed her since she was out of state it would not be possible to do a virtual visit and her urine would need to tested.  I advised her to visit a local urgent care. She is asking that Dr. Claiborne Billings give her a call to discuss options of calling in medication into [facility] in [place] and they can have it transferred. I once again advised her this may not be an option since they have not seen her in office.  She reports having blood in her urine and uti symptoms that began on Friday and  says she has experienced this before and is positive its a UTI.  Please give the patient a call.

## 2022-08-22 NOTE — Telephone Encounter (Signed)
Spoke with patient regarding results/recommendations.  

## 2022-09-02 ENCOUNTER — Encounter: Payer: Self-pay | Admitting: Family Medicine

## 2022-09-02 ENCOUNTER — Ambulatory Visit (INDEPENDENT_AMBULATORY_CARE_PROVIDER_SITE_OTHER): Payer: Medicare Other | Admitting: Family Medicine

## 2022-09-02 VITALS — BP 113/65 | HR 50 | Temp 97.6°F | Wt 193.8 lb

## 2022-09-02 DIAGNOSIS — E785 Hyperlipidemia, unspecified: Secondary | ICD-10-CM | POA: Diagnosis not present

## 2022-09-02 DIAGNOSIS — Z7984 Long term (current) use of oral hypoglycemic drugs: Secondary | ICD-10-CM

## 2022-09-02 DIAGNOSIS — K580 Irritable bowel syndrome with diarrhea: Secondary | ICD-10-CM | POA: Diagnosis not present

## 2022-09-02 DIAGNOSIS — R310 Gross hematuria: Secondary | ICD-10-CM

## 2022-09-02 DIAGNOSIS — I10 Essential (primary) hypertension: Secondary | ICD-10-CM

## 2022-09-02 DIAGNOSIS — R159 Full incontinence of feces: Secondary | ICD-10-CM | POA: Insufficient documentation

## 2022-09-02 DIAGNOSIS — N390 Urinary tract infection, site not specified: Secondary | ICD-10-CM

## 2022-09-02 DIAGNOSIS — E1169 Type 2 diabetes mellitus with other specified complication: Secondary | ICD-10-CM | POA: Diagnosis not present

## 2022-09-02 LAB — POCT URINALYSIS DIPSTICK
Bilirubin, UA: NEGATIVE
Glucose, UA: NEGATIVE
Ketones, UA: NEGATIVE
Nitrite, UA: NEGATIVE
Protein, UA: POSITIVE — AB
Spec Grav, UA: 1.025 (ref 1.010–1.025)
Urobilinogen, UA: 0.2 E.U./dL
pH, UA: 6 (ref 5.0–8.0)

## 2022-09-02 LAB — POCT GLYCOSYLATED HEMOGLOBIN (HGB A1C)
HbA1c POC (<> result, manual entry): 5.7 % (ref 4.0–5.6)
HbA1c, POC (controlled diabetic range): 5.7 % (ref 0.0–7.0)
HbA1c, POC (prediabetic range): 5.7 % (ref 5.7–6.4)
Hemoglobin A1C: 5.7 % — AB (ref 4.0–5.6)

## 2022-09-02 MED ORDER — METFORMIN HCL ER 750 MG PO TB24: 750.0000 mg | ORAL_TABLET | Freq: Every day | ORAL | 1 refills | Status: DC

## 2022-09-02 MED ORDER — FERREX 150 150 MG PO CAPS: 150.0000 mg | ORAL_CAPSULE | Freq: Every day | ORAL | 3 refills | Status: DC

## 2022-09-02 MED ORDER — CANDESARTAN CILEXETIL-HCTZ 16-12.5 MG PO TABS: 1.0000 | ORAL_TABLET | Freq: Every day | ORAL | 1 refills | Status: DC

## 2022-09-02 MED ORDER — ROSUVASTATIN CALCIUM 40 MG PO TABS: 40.0000 mg | ORAL_TABLET | Freq: Every day | ORAL | 3 refills | Status: DC

## 2022-09-02 MED ORDER — CITALOPRAM HYDROBROMIDE 20 MG PO TABS: 20.0000 mg | ORAL_TABLET | Freq: Every day | ORAL | 1 refills | Status: DC

## 2022-09-02 MED ORDER — OMEPRAZOLE 20 MG PO CPDR: 20.0000 mg | DELAYED_RELEASE_CAPSULE | Freq: Every day | ORAL | 1 refills | Status: DC

## 2022-09-02 MED ORDER — CEFDINIR 300 MG PO CAPS: 300.0000 mg | ORAL_CAPSULE | Freq: Two times a day (BID) | ORAL | 0 refills | Status: DC

## 2022-09-02 MED ORDER — DICYCLOMINE HCL 10 MG PO CAPS: 10.0000 mg | ORAL_CAPSULE | Freq: Three times a day (TID) | ORAL | 1 refills | Status: DC

## 2022-09-02 MED ORDER — METOPROLOL SUCCINATE ER 25 MG PO TB24: 25.0000 mg | ORAL_TABLET | Freq: Every day | ORAL | 1 refills | Status: DC

## 2022-09-02 MED ORDER — LINAGLIPTIN 5 MG PO TABS: 5.0000 mg | ORAL_TABLET | Freq: Every day | ORAL | 1 refills | Status: DC

## 2022-09-02 NOTE — Patient Instructions (Signed)
Return in about 24 weeks (around 02/17/2023) for cpe (20 min), Routine chronic condition follow-up.        Great to see you today.  I have refilled the medication(s) we provide.   If labs were collected, we will inform you of lab results once received either by echart message or telephone call.   - echart message- for normal results that have been seen by the patient already.   - telephone call: abnormal results or if patient has not viewed results in their echart.

## 2022-09-02 NOTE — Progress Notes (Signed)
Patient ID: Carol [name], female  DOB: 05/17/1955, 67 y.o.   MRN: [id number] Patient Care Team    Relationship Specialty Notifications Start End  Natalia Leatherwood, DO PCP - General Family Medicine  06/10/19   Jake Bathe, MD Consulting Physician Cardiology  06/12/19   Iva Boop, MD Consulting Physician Gastroenterology  06/12/19   Elwin Mocha, MD Consulting Physician Ophthalmology  06/12/19   Frankey Poot, MD Referring Physician Obstetrics and Gynecology  06/12/19   Jessica Priest, MD Consulting Physician Allergy and Immunology  06/12/19   [facility]  07/26/21     Chief Complaint  Patient presents with   Diabetes    Pt noticed blood in urine today, still having pressure when urinating.     Subjective: Carol [name] is a 67 y.o.  Female  present for chronic condition management and hematuria All past medical history, surgical history, allergies, family history, immunizations, medications and social history were updated in the electronic medical record today. All recent labs, ED visits and hospitalizations within the last year were reviewed.  Type 2 diabetes mellitus without complication, without long-term current use of insulin (HCC)/obesity Pt reports compliance with metformin 750 mg daily and tradjenta 5 mg QD.  Patient denies dizziness, hyperglycemic or hypoglycemic events.  Patient denies dizziness, hyperglycemic or hypoglycemic events. Patient denies numbness, tingling in the extremities or nonhealing wounds of feet.     Essential hypertension/HTN Pt reports compliance with Toprol-XL 25, candesartan-HCTZ 16-12.5 mg daily, Crestor 40 mg daily and baby aspirin. Patient denies chest pain, shortness of breath, dizziness or lower extremity edema.   RF: Hypertension, hyperlipidemia, diabetic, obesity, family history.    Gastroesophageal reflux disease without esophagitis Patient reports symptoms are well-controlled on  her  omeprazole 20 mg daily.  She has been unable to discontinue medication in the past without recurrence of symptoms immediately.   Hot flashes: Patient reports celexa 20 mg qd is very helpful  Iron deficiency anemia: Continues the iron supplement every other day.   Hypercalcemia: Patient was found to have very mild hypercalcemia of 10.5, 10.4 is normal.  She was encouraged to hydrate and avoid any added calcium.Marland Kitchen  Her PTH was normal.  Repeat have been stable  Recurrent UTI/IBS/fecal incontinence: Patient has been suffering from recurrent UTIs.  At one time we had placed her on Macrobid nightly for prophylaxis, she states she has not done this in some time.  She unfortunately has fecal urgency at times causing her to to have incontinence, then this seems to be associated with developing a UTI     09/02/2022    8:53 AM 08/02/2022    2:23 PM 07/13/2022    9:33 AM 01/10/2022    8:25 AM 07/26/2021    8:09 AM  Depression screen PHQ 2/9  Decreased Interest 0 0 0 0 0  Down, Depressed, Hopeless 0 0 0 0 0  PHQ - 2 Score 0 0 0 0 0  Altered sleeping 0    0  Tired, decreased energy 0    3  Change in appetite 0    0  Feeling bad or failure about yourself  0    0  Trouble concentrating 0    0  Moving slowly or fidgety/restless 0    0  Suicidal thoughts 0    0  PHQ-9 Score 0    3  Difficult doing work/chores Not difficult at all  09/02/2022    8:53 AM 07/26/2021    8:11 AM 06/29/2020   10:41 AM  GAD 7 : Generalized Anxiety Score  Nervous, Anxious, on Edge 0 0 1  Control/stop worrying 0 0 0  Worry too much - different things 0 0 0  Trouble relaxing 0 0 0  Restless 0 0 0  Easily annoyed or irritable 0 0 0  Afraid - awful might happen 0 0 0  Total GAD 7 Score 0 0 1  Anxiety Difficulty Not difficult at all      Immunization History  Administered Date(s) Administered   Fluad Quad(high Dose 65+) 02/21/2020, 01/06/2021, 03/02/2022   Influenza,inj,Quad PF,6+ Mos 03/30/2019   PFIZER(Purple  Top)SARS-COV-2 Vaccination 07/02/2019, 07/30/2019, 01/19/2020   Pfizer Covid-19 Vaccine Bivalent Booster 23yrs & up 02/12/2021   Pneumococcal Conjugate-13 03/11/2020   Pneumococcal Polysaccharide-23 03/24/2021   Tdap 08/27/2012   Zoster Recombinat (Shingrix) 11/05/2019, 02/03/2020   Past Medical History:  Diagnosis Date   Angio-edema    Chest discomfort    , Atypical   Chicken pox    COVID-19 05/09/2019   Diabetic acidosis, type II (HCC)    Diverticulitis - large vs small bowel vs both?    Gastroesophageal reflux disease    Glaucoma    Hyperlipidemia    Hypertension    PVC (premature ventricular contraction)    Systolic dysfunction    , Hyperdynamic   Allergies  Allergen Reactions   Ivp Dye [Iodinated Contrast Media] Hives and Shortness Of Breath    Did ok in OP setting with 13 hr Premedications    Latanoprost    Lisinopril Cough   Onion Rash    Rash and GI upset   Sulfa Antibiotics Hives   Past Surgical History:  Procedure Laterality Date   APPENDECTOMY N/A 11/05/2013   Procedure: APPENDECTOMY;  Surgeon: Cherylynn Ridges, MD;  Location: [facility] OR;  Service: General;  Laterality: N/A;   BOWEL RESECTION N/A 11/05/2013   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  "Normal "performed by Dr. Ty Hilts   COLONOSCOPY  04/03/2019   LAPAROTOMY N/A 11/05/2013   Procedure: EXPLORATORY LAPAROTOMY;  Surgeon: Cherylynn Ridges, MD;  Location: [facility] OR;  Service: General;  Laterality: N/A;   Lexiscan  2010   Patient reports normal Lexiscan completed around 2010 by Dr. Ty Hilts.   TOTAL ABDOMINAL HYSTERECTOMY  2004   Family History  Problem Relation Age of Onset   Heart disease Mother    Heart attack Mother    Asthma Mother    Early death Mother    Miscarriages / Stillbirths Mother    Heart disease Father    Lung cancer Father    Heart attack Brother    Asthma Brother    Heart disease Brother    Kidney disease Brother     Diverticulitis Sister    Heart disease Brother    Heart attack Brother    Heart disease Brother    Heart attack Brother    Early death Maternal Grandmother    Tuberculosis Maternal Grandmother    Depression Maternal Grandfather    Heart disease Paternal Grandmother    Heart disease Sister    Colon cancer Neg Hx    Esophageal cancer Neg Hx    Rectal cancer Neg Hx    Stomach cancer Neg Hx    Social History   Social History Narrative   Marital status/children/pets: Married.   Education/employment:  Employed as an Print production planner, high school graduate.   Safety:      -smoke alarm in the home:Yes     - wears seatbelt: Yes     - Feels safe in their relationships: Yes    Allergies as of 09/02/2022       Reactions   Ivp Dye [iodinated Contrast Media] Hives, Shortness Of Breath   Did ok in OP setting with 13 hr Premedications    Latanoprost    Lisinopril Cough   Onion Rash   Rash and GI upset   Sulfa Antibiotics Hives        Medication List        Accurate as of Sep 02, 2022 12:48 PM. If you have any questions, ask your nurse or doctor.          STOP taking these medications    albuterol 108 (90 Base) MCG/ACT inhaler Commonly known as: VENTOLIN HFA Stopped by: Felix Pacini, DO   HYDROcodone-acetaminophen 5-325 MG tablet Commonly known as: NORCO/VICODIN Stopped by: Felix Pacini, DO   naproxen 500 MG tablet Commonly known as: Naprosyn Stopped by: Felix Pacini, DO   PROBIOTIC BLEND PO Stopped by: Felix Pacini, DO       TAKE these medications    aspirin 81 MG tablet Take 81 mg by mouth at bedtime.   candesartan-hydrochlorothiazide 16-12.5 MG tablet Commonly known as: ATACAND HCT Take 1 tablet by mouth daily.   cefdinir 300 MG capsule Commonly known as: OMNICEF Take 1 capsule (300 mg total) by mouth 2 (two) times daily. Started by: Felix Pacini, DO   cetirizine 10 MG tablet Commonly known as: ZYRTEC Take 10 mg by mouth at bedtime.   citalopram  20 MG tablet Commonly known as: CELEXA Take 1 tablet (20 mg total) by mouth daily.   dicyclomine 10 MG capsule Commonly known as: BENTYL Take 1 capsule (10 mg total) by mouth 4 (four) times daily -  before meals and at bedtime. Started by: Felix Pacini, DO   Ferrex 150 150 MG capsule Generic drug: iron polysaccharides Take 1 capsule (150 mg total) by mouth daily.   fluocinonide ointment 0.05 % Commonly known as: LIDEX Apply 1 Application topically 2 (two) times daily.   linagliptin 5 MG Tabs tablet Commonly known as: Tradjenta Take 1 tablet (5 mg total) by mouth daily. What changed: additional instructions Changed by: Felix Pacini, DO   metFORMIN 750 MG 24 hr tablet Commonly known as: GLUCOPHAGE-XR Take 1 tablet (750 mg total) by mouth daily with breakfast.   metoprolol succinate 25 MG 24 hr tablet Commonly known as: TOPROL-XL Take 1 tablet (25 mg total) by mouth daily. Take with or immediately following a meal. What changed: additional instructions Changed by: Felix Pacini, DO   omeprazole 20 MG capsule Commonly known as: PRILOSEC Take 1 capsule (20 mg total) by mouth daily.   OneTouch Delica Plus Lancet33G Misc check blood sugar TWICE DAILY   OneTouch Ultra test strip Generic drug: glucose blood test sugars TWICE DAILY as directed   rosuvastatin 40 MG tablet Commonly known as: CRESTOR Take 1 tablet (40 mg total) by mouth at bedtime.   timolol 0.25 % ophthalmic solution Commonly known as: TIMOPTIC Place 1 drop into the left eye 2 (two) times daily.   Travoprost (BAK Free) 0.004 % Soln ophthalmic solution Commonly known as: TRAVATAN Place 1 drop into both eyes at bedtime.   vitamin C 1000 MG tablet Take 1,000 mg by mouth daily.  All past medical history, surgical history, allergies, family history, immunizations andmedications were updated in the EMR today and reviewed under the history and medication portions of their EMR.       ROS: 14 pt  review of systems performed and negative (unless mentioned in an HPI)  Objective: BP 113/65   Pulse (!) 50   Temp 97.6 F (36.4 C)   Wt 193 lb 12.8 oz (87.9 kg)   SpO2 97%   BMI 34.33 kg/m  Physical Exam Vitals and nursing note reviewed.  Constitutional:      General: She is not in acute distress.    Appearance: Normal appearance. She is obese. She is not ill-appearing, toxic-appearing or diaphoretic.  HENT:     Head: Normocephalic and atraumatic.  Eyes:     General: No scleral icterus.       Right eye: No discharge.        Left eye: No discharge.     Extraocular Movements: Extraocular movements intact.     Conjunctiva/sclera: Conjunctivae normal.     Pupils: Pupils are equal, round, and reactive to light.  Neck:     Thyroid: No thyromegaly.  Cardiovascular:     Rate and Rhythm: Normal rate and regular rhythm.     Heart sounds: Murmur heard.  Pulmonary:     Effort: Pulmonary effort is normal. No respiratory distress.     Breath sounds: Normal breath sounds. No wheezing, rhonchi or rales.  Abdominal:     Tenderness: There is abdominal tenderness (suprapubic). There is no right CVA tenderness or left CVA tenderness.  Musculoskeletal:     Cervical back: Neck supple. No tenderness.     Right lower leg: No edema.     Left lower leg: No edema.  Lymphadenopathy:     Cervical: No cervical adenopathy.  Skin:    General: Skin is warm and dry.     Coloration: Skin is not jaundiced or pale.     Findings: No erythema or rash.  Neurological:     Mental Status: She is alert and oriented to person, place, and time. Mental status is at baseline.     Motor: No weakness.     Gait: Gait normal.  Psychiatric:        Mood and Affect: Mood normal.        Behavior: Behavior normal.        Thought Content: Thought content normal.        Judgment: Judgment normal.    Diabetic Foot Exam - Simple   Simple Foot Form Diabetic Foot exam was performed with the following findings: Yes  09/02/2022  9:02 AM  Visual Inspection Sensation Testing Pulse Check Posterior Tibialis and Dorsalis pulse intact bilaterally: Yes Comments      Results for orders placed or performed in visit on 09/02/22 (from the past 24 hour(s))  POCT Urinalysis Dipstick     Status: Abnormal   Collection Time: 09/02/22  8:54 AM  Result Value Ref Range   Color, UA     Clarity, UA Cloudy    Glucose, UA Negative Negative   Bilirubin, UA Negative    Ketones, UA negative    Spec Grav, UA 1.025 1.010 - 1.025   Blood, UA 3+    pH, UA 6.0 5.0 - 8.0   Protein, UA Positive (A) Negative   Urobilinogen, UA 0.2 0.2 or 1.0 E.U./dL   Nitrite, UA negative    Leukocytes, UA Large (3+) (A) Negative   Appearance cloudy  Odor slight   POCT HgB A1C     Status: Abnormal   Collection Time: 09/02/22  8:57 AM  Result Value Ref Range   Hemoglobin A1C 5.7 (A) 4.0 - 5.6 %   HbA1c POC (<> result, manual entry) 5.7 4.0 - 5.6 %   HbA1c, POC (prediabetic range) 5.7 5.7 - 6.4 %   HbA1c, POC (controlled diabetic range) 5.7 0.0 - 7.0 %    Assessment/plan: Carol [name] is a 67 y.o. female present for chronic condition management and recurrent hematuria Type 2 diabetes mellitus with hyperlipidemia (HCC)/morbid obesity Stable Continue metformin 750 mg daily Continue tradjenta 5 mg -refill after lab results and appropriate dose. Continue Crestor 40 mg daily Continue monitoring blood glucose daily PNA series: Pneumonia series completed Flu shot: Up-to-date  (recommneded yearly) Foot exam:  Completed 08/29/2022 Eye exam: Up-to-date 01/2022 Dr. Sherryll Burger A1c: 6.9> 6.1>7.2>5.8 > 6.4 >6.3 >6.2> 6.7> 5.7 collected  A1c today Urine microalbumin collected today   Essential hypertension/HLD/obesity Stable  continue metoprolol XL at a decreased dose of half tab daily (25 mg dose) Continue candesartan/HCTZ 16-12.5 mg Continue Crestor 40 mg daily Continue baby aspirin daily Labs due next visit  Gastroesophageal reflux  disease without esophagitis Stable Continue omeprazole 20 mg daily   Hot flashes: Stable Continue Celexa 20 mg daily   Hypercalcemia She is continuing to hydrate.  Has been stable  Recurrent UTI/gross hematuria/fecal incontinence/IBS: Point-of-care urinalysis appears infectious today. Urine micro with reflex culture-pending Patient is established with urology We discussed her fecal incontinence/IBS and started Bentyl prior to meals in which the bathroom is not close such as when eating in restaurants. Start Omnicef twice daily while we wait on urine culture   42 minutes was spent during patient encounter on the day of service covering multiple chronic condition management and new hematuria/dysuria with fecal incontinence  Return in about 24 weeks (around 02/17/2023) for cpe (20 min), Routine chronic condition follow-up.   Orders Placed This Encounter  Procedures   Urinalysis w microscopic + reflex cultur   POCT Urinalysis Dipstick   POCT HgB A1C    Meds ordered this encounter  Medications   candesartan-hydrochlorothiazide (ATACAND HCT) 16-12.5 MG tablet    Sig: Take 1 tablet by mouth daily.    Dispense:  90 tablet    Refill:  1   citalopram (CELEXA) 20 MG tablet    Sig: Take 1 tablet (20 mg total) by mouth daily.    Dispense:  90 tablet    Refill:  1   FERREX 150 150 MG capsule    Sig: Take 1 capsule (150 mg total) by mouth daily.    Dispense:  90 capsule    Refill:  3   linagliptin (TRADJENTA) 5 MG TABS tablet    Sig: Take 1 tablet (5 mg total) by mouth daily.    Dispense:  90 tablet    Refill:  1   metFORMIN (GLUCOPHAGE-XR) 750 MG 24 hr tablet    Sig: Take 1 tablet (750 mg total) by mouth daily with breakfast.    Dispense:  90 tablet    Refill:  1   metoprolol succinate (TOPROL-XL) 25 MG 24 hr tablet    Sig: Take 1 tablet (25 mg total) by mouth daily. Take with or immediately following a meal.    Dispense:  90 tablet    Refill:  1   omeprazole (PRILOSEC) 20  MG capsule    Sig: Take 1 capsule (20 mg total) by mouth daily.  Dispense:  90 capsule    Refill:  1   rosuvastatin (CRESTOR) 40 MG tablet    Sig: Take 1 tablet (40 mg total) by mouth at bedtime.    Dispense:  90 tablet    Refill:  3   cefdinir (OMNICEF) 300 MG capsule    Sig: Take 1 capsule (300 mg total) by mouth 2 (two) times daily.    Dispense:  20 capsule    Refill:  0   dicyclomine (BENTYL) 10 MG capsule    Sig: Take 1 capsule (10 mg total) by mouth 4 (four) times daily -  before meals and at bedtime.    Dispense:  120 capsule    Refill:  1    Referral Orders  No referral(s) requested today     Electronically signed by: Felix Pacini, DO [facility]

## 2022-09-03 LAB — URINALYSIS W MICROSCOPIC + REFLEX CULTURE
Glucose, UA: NEGATIVE
WBC, UA: >=60 /HPF — AB (ref 0–5)

## 2022-09-04 LAB — URINALYSIS W MICROSCOPIC + REFLEX CULTURE
Bilirubin Urine: NEGATIVE
Hyaline Cast: NONE SEEN /LPF
Ketones, ur: NEGATIVE
Nitrites, Initial: NEGATIVE
Specific Gravity, Urine: 1.011 (ref 1.001–1.035)
pH: 5.5 (ref 5.0–8.0)

## 2022-09-04 LAB — URINE CULTURE
MICRO NUMBER:: 15004388
SPECIMEN QUALITY:: ADEQUATE

## 2022-09-04 LAB — CULTURE INDICATED

## 2022-09-06 ENCOUNTER — Telehealth: Payer: Self-pay | Admitting: Family Medicine

## 2022-09-06 NOTE — Telephone Encounter (Signed)
Please inform patient her urine did show signs of E.coli UTI and the medication prescribed will treat appropriately.

## 2022-09-06 NOTE — Telephone Encounter (Signed)
Pt advised of results/instructions. If symptoms are still present, pt should schedule follow up.

## 2022-09-16 ENCOUNTER — Ambulatory Visit (INDEPENDENT_AMBULATORY_CARE_PROVIDER_SITE_OTHER): Payer: Medicare Other | Admitting: Family Medicine

## 2022-09-16 ENCOUNTER — Encounter: Payer: Self-pay | Admitting: Family Medicine

## 2022-09-16 VITALS — BP 104/66 | HR 62 | Temp 98.2°F | Wt 192.8 lb

## 2022-09-16 DIAGNOSIS — J329 Chronic sinusitis, unspecified: Secondary | ICD-10-CM | POA: Diagnosis not present

## 2022-09-16 DIAGNOSIS — R300 Dysuria: Secondary | ICD-10-CM

## 2022-09-16 DIAGNOSIS — B9689 Other specified bacterial agents as the cause of diseases classified elsewhere: Secondary | ICD-10-CM | POA: Diagnosis not present

## 2022-09-16 DIAGNOSIS — H9209 Otalgia, unspecified ear: Secondary | ICD-10-CM

## 2022-09-16 MED ORDER — FLUCONAZOLE 150 MG PO TABS: 150.0000 mg | ORAL_TABLET | Freq: Once | ORAL | 0 refills | Status: AC

## 2022-09-16 MED ORDER — LEVOFLOXACIN 500 MG PO TABS: 500.0000 mg | ORAL_TABLET | Freq: Every day | ORAL | 0 refills | Status: AC

## 2022-09-16 NOTE — Progress Notes (Signed)
Carol [name] , 1955/08/21, 67 y.o., female MRN: [id number] Patient Care Team    Relationship Specialty Notifications Start End  Natalia Leatherwood, DO PCP - General Family Medicine  06/10/19   Jake Bathe, MD Consulting Physician Cardiology  06/12/19   Iva Boop, MD Consulting Physician Gastroenterology  06/12/19   Elwin Mocha, MD Consulting Physician Ophthalmology  06/12/19   Frankey Poot, MD Referring Physician Obstetrics and Gynecology  06/12/19   Jessica Priest, MD Consulting Physician Allergy and Immunology  06/12/19   [facility]ists, [facility]  07/26/21     Chief Complaint  Patient presents with   Ear Pain    LEFT ear; 1 week     Subjective: Carol [name] is a 67 y.o. Pt presents for an OV with complaints of LEFT ear pain of 1 week duration.  Associated symptoms include shooting pain. She denies fevers or chills.  She is concerned she may have a sinus infection with left-sided sinus discomfort.   She is also uncertain if her urinary tract infection has completely resolved from 2 weeks ago.  She was treated with Omnicef twice daily for pansensitive E. coli UTI.  She states she feels raw, with burning.     09/02/2022    8:53 AM 08/02/2022    2:23 PM 07/13/2022    9:33 AM 01/10/2022    8:25 AM 07/26/2021    8:09 AM  Depression screen PHQ 2/9  Decreased Interest 0 0 0 0 0  Down, Depressed, Hopeless 0 0 0 0 0  PHQ - 2 Score 0 0 0 0 0  Altered sleeping 0    0  Tired, decreased energy 0    3  Change in appetite 0    0  Feeling bad or failure about yourself  0    0  Trouble concentrating 0    0  Moving slowly or fidgety/restless 0    0  Suicidal thoughts 0    0  PHQ-9 Score 0    3  Difficult doing work/chores Not difficult at all        Allergies  Allergen Reactions   Ivp Dye [Iodinated Contrast Media] Hives and Shortness Of Breath    Did ok in OP setting with 13 hr Premedications    Latanoprost    Lisinopril Cough   Onion Rash     Rash and GI upset   Sulfa Antibiotics Hives   Social History   Social History Narrative   Marital status/children/pets: Married.   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   Safety:      -smoke alarm in the home:Yes     - wears seatbelt: Yes     - Feels safe in their relationships: Yes   Past Medical History:  Diagnosis Date   Angio-edema    Chest discomfort    , Atypical   Chicken pox    COVID-19 05/09/2019   Diabetic acidosis, type II (HCC)    Diverticulitis - large vs small bowel vs both?    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  "Normal "performed by Dr. Ty Hilts   COLONOSCOPY  04/03/2019   LAPAROTOMY N/A 11/05/2013   Procedure: EXPLORATORY LAPAROTOMY;  Surgeon: Cherylynn Ridges, MD;  Location: [facility] OR;  Service: General;  Laterality: N/A;   Lexiscan  2010   Patient reports normal Lexiscan completed around 2010 by Dr. Ty Hilts.   TOTAL ABDOMINAL HYSTERECTOMY  2004   Family History  Problem Relation Age of Onset   Heart disease Mother    Heart attack Mother    Asthma Mother    Early death Mother    Miscarriages / Stillbirths Mother    Heart disease Father    Lung cancer Father    Heart attack Brother    Asthma Brother    Heart disease Brother    Kidney disease Brother    Diverticulitis Sister    Heart disease Brother    Heart attack Brother    Heart disease Brother    Heart attack Brother    Early death Maternal Grandmother    Tuberculosis Maternal Grandmother     Depression Maternal Grandfather    Heart disease Paternal Grandmother    Heart disease Sister    Colon cancer Neg Hx    Esophageal cancer Neg Hx    Rectal cancer Neg Hx    Stomach cancer Neg Hx    Allergies as of 09/16/2022       Reactions   Ivp Dye [iodinated Contrast Media] Hives, Shortness Of Breath   Did ok in OP setting with 13 hr Premedications    Latanoprost    Lisinopril Cough   Onion Rash   Rash and GI upset   Sulfa Antibiotics Hives        Medication List        Accurate as of September 16, 2022  4:24 PM. If you have any questions, ask your nurse or doctor.          aspirin 81 MG tablet Take 81 mg by mouth at bedtime.   candesartan-hydrochlorothiazide 16-12.5 MG tablet Commonly known as: ATACAND HCT Take 1 tablet by mouth daily.   cefdinir 300 MG capsule Commonly known as: OMNICEF Take 1 capsule (300 mg total) by mouth 2 (two) times daily.   cetirizine 10 MG tablet Commonly known as: ZYRTEC Take 10 mg by mouth at bedtime.   citalopram 20 MG tablet Commonly known as: CELEXA Take 1 tablet (20 mg total) by mouth daily.   dicyclomine 10 MG capsule Commonly known as: BENTYL Take 1 capsule (10 mg total) by mouth 4 (four) times daily -  before meals and at bedtime.   Ferrex 150 150 MG capsule Generic drug: iron polysaccharides Take 1 capsule (150 mg total) by mouth daily.   fluconazole 150 MG tablet Commonly known as: DIFLUCAN Take 1 tablet (150 mg total) by mouth once for 1 dose. Started by: Felix Pacini, DO   fluocinonide ointment 0.05 % Commonly known as: LIDEX Apply 1 Application topically 2 (two) times daily.   levofloxacin 500 MG tablet Commonly known as: LEVAQUIN Take 1 tablet (500 mg total) by mouth daily for 7 days. Started by: Felix Pacini, DO   linagliptin 5 MG Tabs tablet Commonly known as: Tradjenta Take 1 tablet (5 mg total) by mouth daily.   metFORMIN 750 MG 24 hr tablet Commonly known as: GLUCOPHAGE-XR Take 1 tablet (750  mg  total) by mouth daily with breakfast.   metoprolol succinate 25 MG 24 hr tablet Commonly known as: TOPROL-XL Take 1 tablet (25 mg total) by mouth daily. Take with or immediately following a meal.   omeprazole 20 MG capsule Commonly known as: PRILOSEC Take 1 capsule (20 mg total) by mouth daily.   OneTouch Delica Plus Lancet33G Misc check blood sugar TWICE DAILY   OneTouch Ultra test strip Generic drug: glucose blood test sugars TWICE DAILY as directed   rosuvastatin 40 MG tablet Commonly known as: CRESTOR Take 1 tablet (40 mg total) by mouth at bedtime.   timolol 0.25 % ophthalmic solution Commonly known as: TIMOPTIC Place 1 drop into the left eye 2 (two) times daily.   Travoprost (BAK Free) 0.004 % Soln ophthalmic solution Commonly known as: TRAVATAN Place 1 drop into both eyes at bedtime.   vitamin C 1000 MG tablet Take 1,000 mg by mouth daily.        All past medical history, surgical history, allergies, family history, immunizations andmedications were updated in the EMR today and reviewed under the history and medication portions of their EMR.     ROS Negative, with the exception of above mentioned in HPI   Objective:  BP 104/66   Pulse 62   Temp 98.2 F (36.8 C)   Wt 192 lb 12.8 oz (87.5 kg)   SpO2 96%   BMI 34.15 kg/m  Body mass index is 34.15 kg/m. Physical Exam Vitals and nursing note reviewed.  Constitutional:      General: She is not in acute distress.    Appearance: Normal appearance. She is normal weight. She is not ill-appearing or toxic-appearing.  HENT:     Head: Normocephalic and atraumatic.     Right Ear: Tympanic membrane, ear canal and external ear normal.     Left Ear: Tympanic membrane, ear canal and external ear normal.     Nose: Congestion and rhinorrhea present.     Comments: Mild effusion bilateral ears.  Erythema bilateral nares    Mouth/Throat:     Mouth: Mucous membranes are moist.     Pharynx: No oropharyngeal exudate or  posterior oropharyngeal erythema.     Comments: Tenderness to palpation left maxillary sinus Eyes:     General: No scleral icterus.       Right eye: No discharge.        Left eye: No discharge.     Extraocular Movements: Extraocular movements intact.     Conjunctiva/sclera: Conjunctivae normal.     Pupils: Pupils are equal, round, and reactive to light.  Skin:    Findings: No rash.  Neurological:     Mental Status: She is alert and oriented to person, place, and time. Mental status is at baseline.     Motor: No weakness.     Coordination: Coordination normal.     Gait: Gait normal.  Psychiatric:        Mood and Affect: Mood normal.        Behavior: Behavior normal.        Thought Content: Thought content normal.        Judgment: Judgment normal.      No results found. No results found. No results found for this or any previous visit (from the past 24 hour(s)).  Assessment/Plan: AQUINNAH DEVIN is a 67 y.o. female present for OV for  Otalgia, unspecified laterality/bacterial sinusitis Patient does leave on vacation this evening. She does have evidence of a  sinus infection very mild. Provided her with a prescription of Levaquin x 7 days in the event symptoms worsen she should start.  Dysuria Suspect her symptoms are likely from a yeast infection. Called in Diflucan - Urine Culture pending > if infection still present, hopefully Levaquin will cover.  Reviewed expectations re: course of current medical issues. Discussed self-management of symptoms. Outlined signs and symptoms indicating need for more acute intervention. Patient verbalized understanding and all questions were answered. Patient received an After-Visit Summary.    Orders Placed This Encounter  Procedures   Urine Culture   Meds ordered this encounter  Medications   levofloxacin (LEVAQUIN) 500 MG tablet    Sig: Take 1 tablet (500 mg total) by mouth daily for 7 days.    Dispense:  7 tablet    Refill:  0    fluconazole (DIFLUCAN) 150 MG tablet    Sig: Take 1 tablet (150 mg total) by mouth once for 1 dose.    Dispense:  1 tablet    Refill:  0   Referral Orders  No referral(s) requested today     Note is dictated utilizing voice recognition software. Although note has been proof read prior to signing, occasional typographical errors still can be missed. If any questions arise, please do not hesitate to call for verification.   electronically signed by:  Felix Pacini, DO  [facility] - [facility]

## 2022-09-16 NOTE — Patient Instructions (Signed)
No follow-ups on file.        Great to see you today.  I have refilled the medication(s) we provide.   If labs were collected, we will inform you of lab results once received either by echart message or telephone call.   - echart message- for normal results that have been seen by the patient already.   - telephone call: abnormal results or if patient has not viewed results in their echart.  

## 2022-09-17 LAB — URINE CULTURE
MICRO NUMBER:: 15056144
SPECIMEN QUALITY:: ADEQUATE

## 2022-10-17 DIAGNOSIS — M21962 Unspecified acquired deformity of left lower leg: Secondary | ICD-10-CM | POA: Diagnosis not present

## 2022-10-17 DIAGNOSIS — M722 Plantar fascial fibromatosis: Secondary | ICD-10-CM | POA: Diagnosis not present

## 2022-10-17 DIAGNOSIS — M21961 Unspecified acquired deformity of right lower leg: Secondary | ICD-10-CM | POA: Diagnosis not present

## 2022-11-07 DIAGNOSIS — M722 Plantar fascial fibromatosis: Secondary | ICD-10-CM | POA: Diagnosis not present

## 2022-11-07 DIAGNOSIS — G629 Polyneuropathy, unspecified: Secondary | ICD-10-CM | POA: Diagnosis not present

## 2022-11-07 DIAGNOSIS — E1142 Type 2 diabetes mellitus with diabetic polyneuropathy: Secondary | ICD-10-CM | POA: Diagnosis not present

## 2022-11-22 ENCOUNTER — Telehealth: Payer: Self-pay

## 2022-11-22 NOTE — Telephone Encounter (Signed)
Error

## 2022-11-23 ENCOUNTER — Encounter: Payer: Self-pay | Admitting: Family Medicine

## 2022-11-23 ENCOUNTER — Ambulatory Visit (INDEPENDENT_AMBULATORY_CARE_PROVIDER_SITE_OTHER): Payer: Medicare Other | Admitting: Family Medicine

## 2022-11-23 VITALS — BP 127/77 | HR 57 | Temp 98.1°F | Wt 200.6 lb

## 2022-11-23 DIAGNOSIS — H6992 Unspecified Eustachian tube disorder, left ear: Secondary | ICD-10-CM | POA: Diagnosis not present

## 2022-11-23 DIAGNOSIS — H9202 Otalgia, left ear: Secondary | ICD-10-CM | POA: Diagnosis not present

## 2022-11-23 MED ORDER — FLUTICASONE PROPIONATE 50 MCG/ACT NA SUSP
2.0000 | Freq: Every day | NASAL | 6 refills | Status: DC
Start: 2022-11-23 — End: 2023-05-10

## 2022-11-23 NOTE — Patient Instructions (Addendum)

## 2022-11-23 NOTE — Progress Notes (Signed)
Carol [name] , [birth year], 67 y.o., female MRN: [id number] Patient Care Team    Relationship Specialty Notifications Start End  Natalia Leatherwood, DO PCP - General Family Medicine  06/10/19   Jake Bathe, MD Consulting Physician Cardiology  06/12/19   Iva Boop, MD Consulting Physician Gastroenterology  06/12/19   Elwin Mocha, MD Consulting Physician Ophthalmology  06/12/19   Frankey Poot, MD Referring Physician Obstetrics and Gynecology  06/12/19   Jessica Priest, MD Consulting Physician Allergy and Immunology  06/12/19   [facility], [facility]  07/26/21     Chief Complaint  Patient presents with   Ear Pain    06/01; left ear starting to cause HA     Subjective: Carol [name] is a 67 y.o. Pt presents for an OV with complaints of left ear pain that has been occurring since 09/10/18/2024.  Patient was seen 09/16/18/2024 for otalgia.  Patient states originally symptoms did resolve but then reoccurred rather quickly and have been present since.  She denies any fevers, chills or sinus pain.  She has had dental work completed on the left side and she went back to the dentist to see if it was dental pain being referred.  They did not believe so.  Her sinuses were reportedly also clear on x-ray at the dental office.       09/02/2022    8:53 AM 08/02/2022    2:23 PM 07/13/2022    9:33 AM 01/10/2022    8:25 AM 07/26/2021    8:09 AM  Depression screen PHQ 2/9  Decreased Interest 0 0 0 0 0  Down, Depressed, Hopeless 0 0 0 0 0  PHQ - 2 Score 0 0 0 0 0  Altered sleeping 0    0  Tired, decreased energy 0    3  Change in appetite 0    0  Feeling bad or failure about yourself  0    0  Trouble concentrating 0    0  Moving slowly or fidgety/restless 0    0  Suicidal thoughts 0    0  PHQ-9 Score 0    3  Difficult doing work/chores Not difficult at all        Allergies  Allergen Reactions   Ivp Dye [Iodinated Contrast Media] Hives and Shortness Of Breath     Did ok in OP setting with 13 hr Premedications    Latanoprost    Lisinopril Cough   Onion Rash    Rash and GI upset   Sulfa Antibiotics Hives   Social History   Social History Narrative   Marital status/children/pets: Married.   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   Safety:      -smoke alarm in the home:Yes     - wears seatbelt: Yes     - Feels safe in their relationships: Yes   Past Medical History:  Diagnosis Date   Angio-edema    Chest discomfort    , Atypical   Chicken pox    COVID-19 05/09/2019   Diabetic acidosis, type II (HCC)    Diverticulitis - large vs small bowel vs both?    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  "Normal "performed by Dr. Ty Hilts   COLONOSCOPY  04/03/2019   LAPAROTOMY N/A 11/05/2013   Procedure: EXPLORATORY LAPAROTOMY;  Surgeon: Cherylynn Ridges, MD;  Location: [facility] OR;  Service: General;  Laterality: N/A;   Lexiscan  2010   Patient reports normal Lexiscan completed around 2010 by Dr. Ty Hilts.   TOTAL ABDOMINAL HYSTERECTOMY  2004   Family History  Problem Relation Age of Onset   Heart disease Mother    Heart attack Mother    Asthma Mother    Early death Mother    Miscarriages / Stillbirths Mother    Heart disease Father    Lung cancer Father    Heart attack Brother    Asthma Brother    Heart disease Brother    Kidney disease Brother    Diverticulitis Sister    Heart disease Brother    Heart attack Brother    Heart disease Brother     Heart attack Brother    Early death Maternal Grandmother    Tuberculosis Maternal Grandmother    Depression Maternal Grandfather    Heart disease Paternal Grandmother    Heart disease Sister    Colon cancer Neg Hx    Esophageal cancer Neg Hx    Rectal cancer Neg Hx    Stomach cancer Neg Hx    Allergies as of 11/23/2022       Reactions   Ivp Dye [iodinated Contrast Media] Hives, Shortness Of Breath   Did ok in OP setting with 13 hr Premedications    Latanoprost    Lisinopril Cough   Onion Rash   Rash and GI upset   Sulfa Antibiotics Hives        Medication List        Accurate as of November 23, 2022  1:52 PM. If you have any questions, ask your nurse or doctor.          STOP taking these medications    cefdinir 300 MG capsule Commonly known as: OMNICEF Stopped by: Felix Pacini       TAKE these medications    aspirin 81 MG tablet Take 81 mg by mouth at bedtime.   candesartan-hydrochlorothiazide 16-12.5 MG tablet Commonly known as: ATACAND HCT Take 1 tablet by mouth daily.   cetirizine 10 MG tablet Commonly known as: ZYRTEC Take 10 mg by mouth at bedtime.   citalopram 20 MG tablet Commonly known as: CELEXA Take 1 tablet (20 mg total) by mouth daily.   dicyclomine 10 MG capsule Commonly known as: BENTYL Take 1 capsule (10 mg total) by mouth 4 (four) times daily -  before meals and at bedtime.   Ferrex 150 150 MG capsule Generic drug: iron polysaccharides Take 1 capsule (150 mg total) by mouth daily.   fluocinonide ointment 0.05 % Commonly known as: LIDEX Apply 1 Application topically 2 (two) times daily.   linagliptin 5 MG Tabs tablet Commonly known as: Tradjenta Take 1 tablet (5 mg total) by mouth daily.   metFORMIN 750 MG 24 hr tablet Commonly known as: GLUCOPHAGE-XR Take 1 tablet (750 mg total) by mouth daily with breakfast.   metoprolol succinate 25 MG 24 hr tablet Commonly known as: TOPROL-XL Take 1 tablet (  25 mg total) by mouth daily.  Take with or immediately following a meal.   omeprazole 20 MG capsule Commonly known as: PRILOSEC Take 1 capsule (20 mg total) by mouth daily.   OneTouch Delica Plus Lancet33G Misc check blood sugar TWICE DAILY   OneTouch Ultra test strip Generic drug: glucose blood test sugars TWICE DAILY as directed   rosuvastatin 40 MG tablet Commonly known as: CRESTOR Take 1 tablet (40 mg total) by mouth at bedtime.   timolol 0.25 % ophthalmic solution Commonly known as: TIMOPTIC Place 1 drop into the left eye 2 (two) times daily.   Travoprost (BAK Free) 0.004 % Soln ophthalmic solution Commonly known as: TRAVATAN Place 1 drop into both eyes at bedtime.   vitamin C 1000 MG tablet Take 1,000 mg by mouth daily.        All past medical history, surgical history, allergies, family history, immunizations andmedications were updated in the EMR today and reviewed under the history and medication portions of their EMR.     ROS Negative, with the exception of above mentioned in HPI   Objective:  BP 127/77   Pulse (!) 57   Temp 98.1 F (36.7 C)   Wt 200 lb 9.6 oz (91 kg)   SpO2 96%   BMI 35.53 kg/m  Body mass index is 35.53 kg/m.  Physical Exam Vitals and nursing note reviewed.  Constitutional:      General: She is not in acute distress.    Appearance: Normal appearance. She is normal weight. She is not ill-appearing or toxic-appearing.  HENT:     Head: Normocephalic and atraumatic.     Comments:  bilateral effusions present, left greater than right.  No erythema.    Right Ear: Ear canal and external ear normal. There is no impacted cerumen.     Left Ear: Ear canal and external ear normal. There is no impacted cerumen.     Nose: No congestion or rhinorrhea.     Mouth/Throat:     Pharynx: No posterior oropharyngeal erythema.  Eyes:     General: No scleral icterus.       Right eye: No discharge.        Left eye: No discharge.     Extraocular Movements: Extraocular movements  intact.     Conjunctiva/sclera: Conjunctivae normal.     Pupils: Pupils are equal, round, and reactive to light.  Musculoskeletal:     Cervical back: Neck supple.  Lymphadenopathy:     Cervical: No cervical adenopathy.  Skin:    Findings: No rash.  Neurological:     Mental Status: She is alert and oriented to person, place, and time. Mental status is at baseline.     Motor: No weakness.     Coordination: Coordination normal.     Gait: Gait normal.  Psychiatric:        Mood and Affect: Mood normal.        Behavior: Behavior normal.        Thought Content: Thought content normal.        Judgment: Judgment normal.      No results found. No results found. No results found for this or any previous visit (from the past 24 hour(s)).  Assessment/Plan: Carol [name] is a 68 y.o. female present for OV for  Left ear pain-eustachian tube dysfunction Continue OTC antihistamine Start Flonase nasal spray twice daily Patient education on eustachian tube dysfunction provided. - Ambulatory referral to ENT   Reviewed expectations re:  course of current medical issues. Discussed self-management of symptoms. Outlined signs and symptoms indicating need for more acute intervention. Patient verbalized understanding and all questions were answered. Patient received an After-Visit Summary.    No orders of the defined types were placed in this encounter.  No orders of the defined types were placed in this encounter.  Referral Orders  No referral(s) requested today     Note is dictated utilizing voice recognition software. Although note has been proof read prior to signing, occasional typographical errors still can be missed. If any questions arise, please do not hesitate to call for verification.   electronically signed by:  Felix Pacini, DO  [facility] - [place]

## 2022-12-05 DIAGNOSIS — E1142 Type 2 diabetes mellitus with diabetic polyneuropathy: Secondary | ICD-10-CM | POA: Diagnosis not present

## 2022-12-05 DIAGNOSIS — M722 Plantar fascial fibromatosis: Secondary | ICD-10-CM | POA: Diagnosis not present

## 2022-12-05 DIAGNOSIS — G629 Polyneuropathy, unspecified: Secondary | ICD-10-CM | POA: Diagnosis not present

## 2022-12-29 ENCOUNTER — Other Ambulatory Visit: Payer: Self-pay | Admitting: Family Medicine

## 2023-01-11 ENCOUNTER — Encounter: Payer: Self-pay | Admitting: Family Medicine

## 2023-01-11 ENCOUNTER — Ambulatory Visit: Payer: Medicare Other | Admitting: Family Medicine

## 2023-01-11 VITALS — BP 116/62 | HR 63 | Wt 200.6 lb

## 2023-01-11 DIAGNOSIS — E1169 Type 2 diabetes mellitus with other specified complication: Secondary | ICD-10-CM | POA: Diagnosis not present

## 2023-01-11 DIAGNOSIS — D508 Other iron deficiency anemias: Secondary | ICD-10-CM

## 2023-01-11 DIAGNOSIS — Z7984 Long term (current) use of oral hypoglycemic drugs: Secondary | ICD-10-CM

## 2023-01-11 DIAGNOSIS — G629 Polyneuropathy, unspecified: Secondary | ICD-10-CM | POA: Diagnosis not present

## 2023-01-11 DIAGNOSIS — E785 Hyperlipidemia, unspecified: Secondary | ICD-10-CM | POA: Diagnosis not present

## 2023-01-11 DIAGNOSIS — E611 Iron deficiency: Secondary | ICD-10-CM | POA: Diagnosis not present

## 2023-01-11 DIAGNOSIS — N390 Urinary tract infection, site not specified: Secondary | ICD-10-CM

## 2023-01-11 DIAGNOSIS — H903 Sensorineural hearing loss, bilateral: Secondary | ICD-10-CM | POA: Diagnosis not present

## 2023-01-11 DIAGNOSIS — H9202 Otalgia, left ear: Secondary | ICD-10-CM | POA: Diagnosis not present

## 2023-01-11 DIAGNOSIS — R5383 Other fatigue: Secondary | ICD-10-CM | POA: Diagnosis not present

## 2023-01-11 LAB — POC URINALSYSI DIPSTICK (AUTOMATED)
Bilirubin, UA: NEGATIVE
Glucose, UA: NEGATIVE
Ketones, UA: NEGATIVE
Nitrite, UA: NEGATIVE
Protein, UA: NEGATIVE
Spec Grav, UA: 1.02 (ref 1.010–1.025)
Urobilinogen, UA: 0.2 U/dL
pH, UA: 5.5 (ref 5.0–8.0)

## 2023-01-11 NOTE — Progress Notes (Signed)
Carol [name] , 06/13/1955, 67 y.o., female MRN: [id number] Patient Care Team    Relationship Specialty Notifications Start End  Natalia Leatherwood, DO PCP - General Family Medicine  06/10/19   Jake Bathe, MD Consulting Physician Cardiology  06/12/19   Iva Boop, MD Consulting Physician Gastroenterology  06/12/19   Elwin Mocha, MD Consulting Physician Ophthalmology  06/12/19   Frankey Poot, MD Referring Physician Obstetrics and Gynecology  06/12/19   Jessica Priest, MD Consulting Physician Allergy and Immunology  06/12/19   [facility]  07/26/21     Chief Complaint  Patient presents with   Fatigue    Feels better when taking iron supplement     Subjective: Carol [name] is a 67 y.o. Pt presents for an OV with complaints of fatigue worsening over the last 2-3 months duration.  Associated symptoms include never feeling rested.  She reports she can sleep 6 to 16 hours a night and would not not wake up feeling rested.  When she wakes up she takes a shower and starts her day and is already ready for a nap.  He is uncertain if she snores at nighttime.  She does fall asleep sitting in the chair on occasions and will wake herself snoring.  2 of her sisters have sleep apnea. Body mass index is 35.53 kg/m.  He is a diabetic and also treated for hypertension.  She reports her sugars have been elevated over 160 routinely recently with out identifiable cause.  Her diet has not changed. She has had neuropathy in which she is now seeing a podiatrist who is prescribing her neuremedy-which is "medical grade "thiamine.  She has noticed an improvement in her feet discomfort since starting medication 8 weeks ago.  She has had recurrent UTIs, flares of irritable bowel      09/02/2022    8:53 AM 08/02/2022    2:23 PM 07/13/2022    9:33 AM 01/10/2022    8:25 AM 07/26/2021    8:09 AM  Depression screen PHQ 2/9  Decreased Interest 0 0 0 0 0  Down, Depressed,  Hopeless 0 0 0 0 0  PHQ - 2 Score 0 0 0 0 0  Altered sleeping 0    0  Tired, decreased energy 0    3  Change in appetite 0    0  Feeling bad or failure about yourself  0    0  Trouble concentrating 0    0  Moving slowly or fidgety/restless 0    0  Suicidal thoughts 0    0  PHQ-9 Score 0    3  Difficult doing work/chores Not difficult at all        Allergies  Allergen Reactions   Ivp Dye [Iodinated Contrast Media] Hives and Shortness Of Breath    Did ok in OP setting with 13 hr Premedications    Latanoprost    Lisinopril Cough   Onion Rash    Rash and GI upset   Sulfa Antibiotics Hives   Social History   Social History Narrative   Marital status/children/pets: Married.   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   Safety:      -smoke alarm in the home:Yes     - wears seatbelt: Yes     - Feels safe in their relationships: Yes   Past Medical History:  Diagnosis Date   Angio-edema  Chest discomfort    , Atypical   Chicken pox    COVID-19 05/09/2019   Diabetic acidosis, type II (HCC)    Diverticulitis - large vs small bowel vs both?    Gastroesophageal reflux disease    Glaucoma    Hyperlipidemia    Hypertension    PVC (premature ventricular contraction)    Systolic dysfunction    , Hyperdynamic   Past Surgical History:  Procedure Laterality Date   APPENDECTOMY N/A 11/05/2013   Procedure: APPENDECTOMY;  Surgeon: Cherylynn Ridges, MD;  Location: [facility] OR;  Service: General;  Laterality: N/A;   BOWEL RESECTION N/A 11/05/2013   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  "Normal "performed by Dr. Ty Hilts   COLONOSCOPY  04/03/2019   LAPAROTOMY N/A 11/05/2013   Procedure: EXPLORATORY LAPAROTOMY;  Surgeon: Cherylynn Ridges, MD;  Location: [facility] OR;  Service: General;  Laterality: N/A;   Lexiscan  2010   Patient reports normal Lexiscan completed around 2010 by Dr. Ty Hilts.    TOTAL ABDOMINAL HYSTERECTOMY  2004   Family History  Problem Relation Age of Onset   Heart disease Mother    Heart attack Mother    Asthma Mother    Early death Mother    Miscarriages / Stillbirths Mother    Heart disease Father    Lung cancer Father    Heart attack Brother    Asthma Brother    Heart disease Brother    Kidney disease Brother    Diverticulitis Sister    Heart disease Brother    Heart attack Brother    Heart disease Brother    Heart attack Brother    Early death Maternal Grandmother    Tuberculosis Maternal Grandmother    Depression Maternal Grandfather    Heart disease Paternal Grandmother    Heart disease Sister    Colon cancer Neg Hx    Esophageal cancer Neg Hx    Rectal cancer Neg Hx    Stomach cancer Neg Hx    Allergies as of 01/11/2023       Reactions   Ivp Dye [iodinated Contrast Media] Hives, Shortness Of Breath   Did ok in OP setting with 13 hr Premedications    Latanoprost    Lisinopril Cough   Onion Rash   Rash and GI upset   Sulfa Antibiotics Hives        Medication List        Accurate as of January 11, 2023  3:10 PM. If you have any questions, ask your nurse or doctor.          aspirin 81 MG tablet Take 81 mg by mouth at bedtime.   candesartan-hydrochlorothiazide 16-12.5 MG tablet Commonly known as: ATACAND HCT Take 1 tablet by mouth daily.   cetirizine 10 MG tablet Commonly known as: ZYRTEC Take 10 mg by mouth at bedtime.   citalopram 20 MG tablet Commonly known as: CELEXA Take 1 tablet (20 mg total) by mouth daily.   dicyclomine 10 MG capsule Commonly known as: BENTYL Take 1 capsule (10 mg total) by mouth 4 (four) times daily -  before meals and at bedtime.   Ferrex 150 150 MG capsule Generic drug: iron polysaccharides Take 1 capsule (150 mg total) by mouth daily.   fluocinonide ointment 0.05 % Commonly known as: LIDEX Apply 1 Application topically 2 (two) times daily.   fluticasone 50 MCG/ACT nasal  spray Commonly known  as: FLONASE Place 2 sprays into both nostrils daily.   linagliptin 5 MG Tabs tablet Commonly known as: Tradjenta Take 1 tablet (5 mg total) by mouth daily.   metFORMIN 750 MG 24 hr tablet Commonly known as: GLUCOPHAGE-XR Take 1 tablet (750 mg total) by mouth daily with breakfast.   metoprolol succinate 25 MG 24 hr tablet Commonly known as: TOPROL-XL Take 1 tablet (25 mg total) by mouth daily. Take with or immediately following a meal.   omeprazole 20 MG capsule Commonly known as: PRILOSEC Take 1 capsule (20 mg total) by mouth daily.   OneTouch Delica Plus Lancet33G Misc check blood sugar TWICE DAILY   OneTouch Ultra test strip Generic drug: glucose blood test sugars TWICE DAILY as directed   rosuvastatin 40 MG tablet Commonly known as: CRESTOR Take 1 tablet (40 mg total) by mouth at bedtime.   timolol 0.25 % ophthalmic solution Commonly known as: TIMOPTIC Place 1 drop into the left eye 2 (two) times daily.   Travoprost (BAK Free) 0.004 % Soln ophthalmic solution Commonly known as: TRAVATAN Place 1 drop into both eyes at bedtime.   vitamin C 1000 MG tablet Take 1,000 mg by mouth daily.        All past medical history, surgical history, allergies, family history, immunizations andmedications were updated in the EMR today and reviewed under the history and medication portions of their EMR.     ROS Negative, with the exception of above mentioned in HPI   Objective:  BP 116/62   Pulse 63   Wt 200 lb 9.6 oz (91 kg)   SpO2 98%   BMI 35.53 kg/m  Body mass index is 35.53 kg/m. Physical Exam Vitals and nursing note reviewed.  Constitutional:      General: She is not in acute distress.    Appearance: Normal appearance. She is not ill-appearing, toxic-appearing or diaphoretic.  HENT:     Head: Normocephalic and atraumatic.  Eyes:     General: No scleral icterus.       Right eye: No discharge.        Left eye: No discharge.      Extraocular Movements: Extraocular movements intact.     Conjunctiva/sclera: Conjunctivae normal.     Pupils: Pupils are equal, round, and reactive to light.  Cardiovascular:     Rate and Rhythm: Normal rate and regular rhythm.  Pulmonary:     Effort: Pulmonary effort is normal. No respiratory distress.     Breath sounds: Normal breath sounds. No wheezing, rhonchi or rales.  Musculoskeletal:     Right lower leg: No edema.     Left lower leg: No edema.  Skin:    General: Skin is warm.     Findings: No rash.  Neurological:     Mental Status: She is alert and oriented to person, place, and time. Mental status is at baseline.     Motor: No weakness.     Gait: Gait normal.  Psychiatric:        Mood and Affect: Mood normal.        Behavior: Behavior normal.        Thought Content: Thought content normal.        Judgment: Judgment normal.     No results found. No results found. No results found for this or any previous visit (from the past 24 hour(s)).  Assessment/Plan: TULIA BAIRES is a 67 y.o. female present for OV for  Type 2 diabetes mellitus with hyperlipidemia (  HCC) Needed sugars reported.  We discussed possibly could be related to an infectious process driving up her sugars. - Urine Microalbumin w/creat. Ratio  iron deficiency anemia Currently supplementing every other day with Ferrex 150 - CBC w/Diff - IBC + Ferritin  Fatigue Possibly multifactorial she has been going through a lot over the last 6 months. We discussed starting laboratory workup, and she is agreeable to that today. Consider sleep apnea eval if laboratory workup does not identify likely cause of increased fatigue. Consider autoimmune workup - CBC w/Diff - TSH - Comp Met (CMET) - Vitamin D (25 hydroxy) - B12 and Folate Panel - IBC + Ferritin - POCT Urinalysis Dipstick (Automated) - Urine Culture - Vitamin B1 - T4, free - Thyroid peroxidase antibody  Neuropathy Currently taking a rather  expensive neuropathy prescription that contains thiamine.  Will test levels for her fatigue today and also the levels. - B12 and Folate Panel - Vitamin B1  Recurrent UTI Point-of-care urine and culture sent today.   Reviewed expectations re: course of current medical issues. Discussed self-management of symptoms. Outlined signs and symptoms indicating need for more acute intervention. Patient verbalized understanding and all questions were answered. Patient received an After-Visit Summary.    Orders Placed This Encounter  Procedures   Urine Culture   Urine Microalbumin w/creat. ratio   CBC w/Diff   TSH   Comp Met (CMET)   Vitamin D (25 hydroxy)   B12 and Folate Panel   IBC + Ferritin   Vitamin B1   T4, free   Thyroid peroxidase antibody   POCT Urinalysis Dipstick (Automated)   No orders of the defined types were placed in this encounter.  Referral Orders  No referral(s) requested today     Note is dictated utilizing voice recognition software. Although note has been proof read prior to signing, occasional typographical errors still can be missed. If any questions arise, please do not hesitate to call for verification.   electronically signed by:  Felix Pacini, DO  [facility] - OR

## 2023-01-12 LAB — CBC WITH DIFFERENTIAL/PLATELET
Basophils Absolute: 0.1 10*3/uL (ref 0.0–0.1)
Basophils Relative: 0.8 % (ref 0.0–3.0)
Eosinophils Absolute: 0.4 10*3/uL (ref 0.0–0.7)
Eosinophils Relative: 3.2 % (ref 0.0–5.0)
HCT: 37.2 % (ref 36.0–46.0)
Hemoglobin: 11.7 g/dL — ABNORMAL LOW (ref 12.0–15.0)
Lymphocytes Relative: 25.4 % (ref 12.0–46.0)
Lymphs Abs: 3.1 10*3/uL (ref 0.7–4.0)
MCHC: 31.5 g/dL (ref 30.0–36.0)
MCV: 83.3 fL (ref 78.0–100.0)
Monocytes Absolute: 0.8 10*3/uL (ref 0.1–1.0)
Monocytes Relative: 6.8 % (ref 3.0–12.0)
Neutro Abs: 7.9 10*3/uL — ABNORMAL HIGH (ref 1.4–7.7)
Neutrophils Relative %: 63.8 % (ref 43.0–77.0)
Platelets: 445 10*3/uL — ABNORMAL HIGH (ref 150.0–400.0)
RBC: 4.47 Mil/uL (ref 3.87–5.11)
RDW: 15.2 % (ref 11.5–15.5)
WBC: 12.4 10*3/uL — ABNORMAL HIGH (ref 4.0–10.5)

## 2023-01-12 LAB — IBC + FERRITIN
Ferritin: 26.7 ng/mL (ref 10.0–291.0)
Iron: 59 ug/dL (ref 42–145)
Saturation Ratios: 13.7 % — ABNORMAL LOW (ref 20.0–50.0)
TIBC: 429.8 ug/dL (ref 250.0–450.0)
Transferrin: 307 mg/dL (ref 212.0–360.0)

## 2023-01-12 LAB — COMPREHENSIVE METABOLIC PANEL
ALT: 16 U/L (ref 0–35)
AST: 19 U/L (ref 0–37)
Albumin: 4.2 g/dL (ref 3.5–5.2)
Alkaline Phosphatase: 83 U/L (ref 39–117)
BUN: 23 mg/dL (ref 6–23)
CO2: 29 meq/L (ref 19–32)
Calcium: 10.2 mg/dL (ref 8.4–10.5)
Chloride: 100 meq/L (ref 96–112)
Creatinine, Ser: 0.97 mg/dL (ref 0.40–1.20)
GFR: 60.31 mL/min (ref >60.00)
Glucose, Bld: 146 mg/dL — ABNORMAL HIGH (ref 70–99)
Potassium: 4.3 meq/L (ref 3.5–5.1)
Sodium: 138 meq/L (ref 135–145)
Total Bilirubin: 0.3 mg/dL (ref 0.2–1.2)
Total Protein: 7.4 g/dL (ref 6.0–8.3)

## 2023-01-12 LAB — B12 AND FOLATE PANEL
Folate: 11.9 ng/mL (ref >5.9)
Vitamin B-12: 276 pg/mL (ref 211–911)

## 2023-01-12 LAB — MICROALBUMIN / CREATININE URINE RATIO
Creatinine,U: 61.8 mg/dL
Microalb Creat Ratio: 7 mg/g (ref 0.0–30.0)
Microalb, Ur: 4.3 mg/dL — ABNORMAL HIGH (ref 0.0–1.9)

## 2023-01-12 LAB — T4, FREE: Free T4: 0.99 ng/dL (ref 0.60–1.60)

## 2023-01-12 LAB — VITAMIN D 25 HYDROXY (VIT D DEFICIENCY, FRACTURES): VITD: 25.95 ng/mL — ABNORMAL LOW (ref 30.00–100.00)

## 2023-01-12 LAB — TSH: TSH: 2.38 u[IU]/mL (ref 0.35–5.50)

## 2023-01-13 ENCOUNTER — Encounter: Payer: Self-pay | Admitting: Family Medicine

## 2023-01-13 ENCOUNTER — Telehealth: Payer: Self-pay | Admitting: Family Medicine

## 2023-01-13 DIAGNOSIS — E538 Deficiency of other specified B group vitamins: Secondary | ICD-10-CM | POA: Insufficient documentation

## 2023-01-13 MED ORDER — CIPROFLOXACIN HCL 500 MG PO TABS: 500.0000 mg | ORAL_TABLET | Freq: Two times a day (BID) | ORAL | 0 refills | Status: AC

## 2023-01-13 NOTE — Telephone Encounter (Signed)
Please call patient Liver, kidney and thyroid function are all normal Blood cell counts are stable and electrolytes are normal Folate levels normal.  Vitamin D levels are low at 26, although they are low, I do not believe they are low enough to be causing her symptoms.  However I do encourage her to increase her vitamin D supplement by 1000 units daily.  B12 is severely low at 276.  I would recommend she come in for B12 injections every 2 weeks for 4 doses.  This should get her B12 levels into normal range, and then hopefully we can keep them in normal range with B12 1000 mcg sublingual solution or dissolvable tablets placed under the tongue-which is over-the-counter. Thiamine/B1 levels are pending and needs to take a little longer to complete   White blood cell counts are mildly elevated, which has been consistent over the last year, however each time labs were collected she was not feeling well.  Since her urine is also abnormal and shows signs of a UTI, I suspect the elevated white blood cells are secondary to that infection.  We will need to follow-up with her in about 2 weeks, to ensure her white blood cells do return to normal after treatment of UTI and we do not need to do further evaluation on cause.  We would also recheck her urine that day.   -I have called in Cipro 500 mg twice daily x 5 days to treat UTI.

## 2023-01-13 NOTE — Telephone Encounter (Signed)
Spoke with patient regarding results/recommendations.  

## 2023-01-15 LAB — URINE CULTURE
MICRO NUMBER:: 15542440
SPECIMEN QUALITY:: ADEQUATE

## 2023-01-15 LAB — VITAMIN B1: Vitamin B1 (Thiamine): 184 nmol/L — ABNORMAL HIGH (ref 8–30)

## 2023-01-15 LAB — THYROID PEROXIDASE ANTIBODY: Thyroperoxidase Ab SerPl-aCnc: <1 [IU]/mL (ref <9)

## 2023-01-16 ENCOUNTER — Telehealth: Payer: Self-pay | Admitting: Family Medicine

## 2023-01-16 NOTE — Telephone Encounter (Signed)
Please inform patient The Cipro medication prescribed for UTI, will treat this UTI appropriately by the results of her cultures.  Her B1 vitamins are very well supplemented.  This tells me that the supplement she is taking for her neuropathy that is expensive, is all B1 which is called thiamine.  This can be purchased over-the-counter.  I suspect the dose she is taking is 50-100 mg daily and the supplement she is currently purchasing.

## 2023-01-16 NOTE — Telephone Encounter (Signed)
LM for pt to return call to discuss.  

## 2023-01-30 ENCOUNTER — Ambulatory Visit (INDEPENDENT_AMBULATORY_CARE_PROVIDER_SITE_OTHER): Payer: Medicare Other

## 2023-01-30 DIAGNOSIS — E538 Deficiency of other specified B group vitamins: Secondary | ICD-10-CM | POA: Diagnosis not present

## 2023-02-06 DIAGNOSIS — H401131 Primary open-angle glaucoma, bilateral, mild stage: Secondary | ICD-10-CM | POA: Diagnosis not present

## 2023-02-06 DIAGNOSIS — G629 Polyneuropathy, unspecified: Secondary | ICD-10-CM | POA: Diagnosis not present

## 2023-02-13 ENCOUNTER — Encounter: Payer: Self-pay | Admitting: Urgent Care

## 2023-02-13 ENCOUNTER — Ambulatory Visit (INDEPENDENT_AMBULATORY_CARE_PROVIDER_SITE_OTHER): Payer: Medicare Other | Admitting: Urgent Care

## 2023-02-13 ENCOUNTER — Ambulatory Visit: Payer: Medicare Other

## 2023-02-13 VITALS — BP 129/71 | HR 65 | Temp 98.3°F | Wt 200.1 lb

## 2023-02-13 DIAGNOSIS — E785 Hyperlipidemia, unspecified: Secondary | ICD-10-CM | POA: Diagnosis not present

## 2023-02-13 DIAGNOSIS — E538 Deficiency of other specified B group vitamins: Secondary | ICD-10-CM | POA: Diagnosis not present

## 2023-02-13 DIAGNOSIS — R300 Dysuria: Secondary | ICD-10-CM

## 2023-02-13 DIAGNOSIS — N390 Urinary tract infection, site not specified: Secondary | ICD-10-CM

## 2023-02-13 DIAGNOSIS — E1169 Type 2 diabetes mellitus with other specified complication: Secondary | ICD-10-CM

## 2023-02-13 DIAGNOSIS — R350 Frequency of micturition: Secondary | ICD-10-CM

## 2023-02-13 LAB — POC URINALSYSI DIPSTICK (AUTOMATED)
Bilirubin, UA: NEGATIVE
Blood, UA: NEGATIVE
Glucose, UA: NEGATIVE
Ketones, UA: NEGATIVE
Leukocytes, UA: NEGATIVE
Nitrite, UA: NEGATIVE
Protein, UA: NEGATIVE
Spec Grav, UA: 1.015 (ref 1.010–1.025)
Urobilinogen, UA: 0.2 U/dL — AB
pH, UA: 5.5 (ref 5.0–8.0)

## 2023-02-13 MED ORDER — CYANOCOBALAMIN 1000 MCG/ML IJ SOLN
1000.0000 ug | Freq: Once | INTRAMUSCULAR | Status: AC
Start: 2023-02-13 — End: 2023-02-13
  Administered 2023-02-13: 1000 ug via INTRAMUSCULAR

## 2023-02-13 MED ORDER — URIBEL 118 MG PO CAPS
1.0000 | ORAL_CAPSULE | Freq: Four times a day (QID) | ORAL | 0 refills | Status: DC
Start: 2023-02-13 — End: 2023-07-17

## 2023-02-13 NOTE — Assessment & Plan Note (Signed)
Reports urinary frequency, pressure, and back pain since 02/10/2023. No fever, hematuria, or vaginal symptoms. History of recent UTIs in October and May/June. -UA in office appears negative, send culture for confirmation -Start appropriate antibiotic therapy if culture confirms infection -Increase water intake -Uribel PRN for sx management

## 2023-02-13 NOTE — Assessment & Plan Note (Signed)
Reports fasting blood glucose of 197 this morning, typically 120-140. Noted increase in glucose levels with onset of urinary symptoms. -Monitor blood glucose levels. -Consider adjustment of diabetes management if hyperglycemia persists.

## 2023-02-13 NOTE — Progress Notes (Signed)
Established Patient Office Visit  Subjective:  Patient ID: Carol [name], female    DOB: Nov 24, 1955  Age: 67 y.o. MRN: [id number]  Chief Complaint  Patient presents with   Urinary Tract Infection    Burning and frequency with urination. Pt also wants her normal B12 injection today.    67yo female with a history of recurrent urinary tract infections (UTIs), presents with symptoms suggestive of another UTI. She reports an increase in urinary frequency and bladder pressure that started around Friday or Saturday. Accompanying these symptoms, she noticed an increase in her blood glucose levels, which are typically well-controlled, with morning readings usually between 120-140. However, the morning reading was 197, which is significantly higher than her usual range.  The patient denies any hematuria but reports new onset back pain since the urinary symptoms started. She denies any fever or vaginal symptoms. The last UTI was reported in early October, and the one before that was around the end of May or early June. The patient's presentation of increased urinary symptoms along with elevated blood glucose levels is typical for her UTI episodes. She denies any additional acute complaints.  Pt also presents for routine B12 injection.  Urinary Tract Infection     Patient Active Problem List   Diagnosis Date Noted   B12 deficiency 01/13/2023   Irritable bowel syndrome with diarrhea 09/02/2022   Full incontinence of feces 09/02/2022   Recurrent UTI 01/10/2022   Iron deficiency 01/10/2022   Obesity (BMI 30-39.9) 03/25/2021   Hypercalcemia 12/08/2020   Iron deficiency anemia 12/08/2020   Hot flashes due to menopause 11/06/2019   Morbid obesity (HCC) 02/11/2019   Type 2 diabetes mellitus with hyperlipidemia (HCC) 02/11/2019   Diverticulosis of large intestine 01/28/2014   Diverticulitis large intestine 10/31/2013   Systolic dysfunction    Gastroesophageal reflux disease    Hypertension     Past Medical History:  Diagnosis Date   Angio-edema    Chest discomfort    , Atypical   Chicken pox    COVID-19 05/09/2019   Diabetic acidosis, type II (HCC)    Diverticulitis - large vs small bowel vs both?    Gastroesophageal reflux disease    Glaucoma    Hyperlipidemia    Hypertension    PVC (premature ventricular contraction)    Systolic dysfunction    , Hyperdynamic   Social History   Tobacco Use   Smoking status: Never    Passive exposure: Never   Smokeless tobacco: Never  Vaping Use   Vaping status: Never Used  Substance Use Topics   Alcohol use: Not Currently    Comment: Occassional drinker   Drug use: No      ROS: as noted in HPI  Objective:     BP 129/71   Pulse 65   Temp 98.3 F (36.8 C) (Oral)   Wt 200 lb 1.9 oz (90.8 kg)   SpO2 97%   BMI 35.45 kg/m  BP Readings from Last 3 Encounters:  02/13/23 129/71  01/11/23 116/62  11/23/22 127/77   Wt Readings from Last 3 Encounters:  02/13/23 200 lb 1.9 oz (90.8 kg)  01/11/23 200 lb 9.6 oz (91 kg)  11/23/22 200 lb 9.6 oz (91 kg)      Physical Exam Vitals and nursing note reviewed.  Constitutional:      General: She is not in acute distress.    Appearance: Normal appearance. She is not ill-appearing, toxic-appearing or diaphoretic.  HENT:     Head:  Normocephalic and atraumatic.  Eyes:     General:        Right eye: No discharge.        Left eye: No discharge.     Extraocular Movements: Extraocular movements intact.     Pupils: Pupils are equal, round, and reactive to light.  Cardiovascular:     Rate and Rhythm: Normal rate.  Pulmonary:     Effort: Pulmonary effort is normal. No respiratory distress.  Abdominal:     General: Abdomen is flat. There is no distension.     Palpations: Abdomen is soft.     Tenderness: There is no abdominal tenderness. There is no right CVA tenderness, left CVA tenderness, guarding or rebound.  Skin:    General: Skin is warm and dry.     Coloration: Skin  is not jaundiced.     Findings: No bruising, erythema or rash.  Neurological:     Mental Status: She is alert and oriented to person, place, and time.      Results for orders placed or performed in visit on 02/13/23  POCT Urinalysis Dipstick (Automated)  Result Value Ref Range   Color, UA Yellow    Clarity, UA clear    Glucose, UA Negative Negative   Bilirubin, UA negative    Ketones, UA negative    Spec Grav, UA 1.015 1.010 - 1.025   Blood, UA negative    pH, UA 5.5 5.0 - 8.0   Protein, UA Negative Negative   Urobilinogen, UA 0.2 (A) 0.2 or 1.0 E.U./dL   Nitrite, UA negative    Leukocytes, UA Negative Negative    Last hemoglobin A1c Lab Results  Component Value Date   HGBA1C 5.7 (A) 09/02/2022   HGBA1C 5.7 09/02/2022   HGBA1C 5.7 09/02/2022   HGBA1C 5.7 09/02/2022      The 10-year ASCVD risk score (Arnett DK, et al., 2019) is: 17%  Assessment & Plan:  Urinary frequency -     POCT Urinalysis Dipstick (Automated) -     Uribel; Take 1 capsule (118 mg total) by mouth in the morning, at noon, in the evening, and at bedtime. Take as needed for urinary pain and frequency.  Dispense: 30 capsule; Refill: 0 -     Urine Culture  Dysuria -     Uribel; Take 1 capsule (118 mg total) by mouth in the morning, at noon, in the evening, and at bedtime. Take as needed for urinary pain and frequency.  Dispense: 30 capsule; Refill: 0 -     Urine Culture  Recurrent UTI Assessment & Plan: Reports urinary frequency, pressure, and back pain since 02/10/2023. No fever, hematuria, or vaginal symptoms. History of recent UTIs in October and May/June. -UA in office appears negative, send culture for confirmation -Start appropriate antibiotic therapy if culture confirms infection -Increase water intake -Uribel PRN for sx management  Orders: -     Uribel; Take 1 capsule (118 mg total) by mouth in the morning, at noon, in the evening, and at bedtime. Take as needed for urinary pain and  frequency.  Dispense: 30 capsule; Refill: 0 -     Urine Culture  B12 deficiency Assessment & Plan: Vitamin B12 Deficiency Scheduled for B12 injection today. -Administer B12 injection as planned.  Orders: -     Cyanocobalamin  Type 2 diabetes mellitus with hyperlipidemia (HCC) Assessment & Plan: Reports fasting blood glucose of 197 this morning, typically 120-140. Noted increase in glucose levels with onset of urinary symptoms. -Monitor  blood glucose levels. -Consider adjustment of diabetes management if hyperglycemia persists.      No follow-ups on file.   Maretta Bees, PA

## 2023-02-13 NOTE — Assessment & Plan Note (Signed)
Vitamin B12 Deficiency Scheduled for B12 injection today. -Administer B12 injection as planned.

## 2023-02-13 NOTE — Patient Instructions (Signed)
Your urine dipstick in office appears normal. However, due to your history of recurrent UTIs in the past, will send out a urine culture for confirmation. You can take the Uribel prescribed today up to every 6 hours as needed for urinary pain and frequency. Only take as needed. It will change your urine the color blue. Drink plenty of water, consider cranberry juice or cranberry caps. If your urine culture is positive for bacteria, we will call you and start an oral antibiotic.   You were given your B12 injection today.  Continue to monitor your home glucose readings. If they remain elevated, contact our office for further instructions.

## 2023-02-14 LAB — URINE CULTURE
MICRO NUMBER:: 15682541
Result:: NO GROWTH
SPECIMEN QUALITY:: ADEQUATE

## 2023-02-17 ENCOUNTER — Other Ambulatory Visit: Payer: Self-pay | Admitting: Family Medicine

## 2023-02-27 ENCOUNTER — Ambulatory Visit (INDEPENDENT_AMBULATORY_CARE_PROVIDER_SITE_OTHER): Payer: Medicare Other

## 2023-02-27 DIAGNOSIS — E538 Deficiency of other specified B group vitamins: Secondary | ICD-10-CM | POA: Diagnosis not present

## 2023-02-27 MED ORDER — CYANOCOBALAMIN 1000 MCG/ML IJ SOLN
1000.0000 ug | Freq: Once | INTRAMUSCULAR | Status: AC
Start: 2023-02-27 — End: 2023-01-30
  Administered 2023-01-30: 1000 ug via INTRAMUSCULAR

## 2023-02-27 MED ORDER — CYANOCOBALAMIN 1000 MCG/ML IJ SOLN
1000.0000 ug | Freq: Once | INTRAMUSCULAR | Status: AC
Start: 2023-02-27 — End: 2023-02-27
  Administered 2023-02-27: 1000 ug via INTRAMUSCULAR

## 2023-02-27 NOTE — Progress Notes (Signed)
Pt here for bi- weekly B12 injection per Kuneff  B12 1000mcg given IM, and pt tolerated injection well.  Next B12 injection scheduled for 2 weeks  

## 2023-03-13 ENCOUNTER — Ambulatory Visit (INDEPENDENT_AMBULATORY_CARE_PROVIDER_SITE_OTHER): Payer: Medicare Other

## 2023-03-13 DIAGNOSIS — E538 Deficiency of other specified B group vitamins: Secondary | ICD-10-CM

## 2023-03-13 MED ORDER — CYANOCOBALAMIN 1000 MCG/ML IJ SOLN
1000.0000 ug | Freq: Once | INTRAMUSCULAR | Status: AC
Start: 2023-03-13 — End: 2023-03-13
  Administered 2023-03-13: 1000 ug via INTRAMUSCULAR

## 2023-03-13 NOTE — Progress Notes (Signed)
Pt here for bi- weekly B12 injection per Kuneff  B12 1000mcg given IM, and pt tolerated injection well.  Next B12 injection scheduled for 2 weeks  

## 2023-03-16 ENCOUNTER — Other Ambulatory Visit: Payer: Self-pay | Admitting: Family Medicine

## 2023-04-10 ENCOUNTER — Other Ambulatory Visit: Payer: Self-pay | Admitting: Family Medicine

## 2023-04-10 ENCOUNTER — Other Ambulatory Visit: Payer: Self-pay

## 2023-04-10 DIAGNOSIS — E1169 Type 2 diabetes mellitus with other specified complication: Secondary | ICD-10-CM

## 2023-04-10 MED ORDER — LINAGLIPTIN 5 MG PO TABS: 5.0000 mg | ORAL_TABLET | Freq: Every day | ORAL | 0 refills | Status: DC

## 2023-04-10 MED ORDER — METOPROLOL SUCCINATE ER 25 MG PO TB24: 25.0000 mg | ORAL_TABLET | Freq: Every day | ORAL | 0 refills | Status: DC

## 2023-04-10 NOTE — Addendum Note (Signed)
Addended by: Filomena Jungling on: 04/10/2023 02:53 PM   Modules accepted: Orders

## 2023-05-10 ENCOUNTER — Other Ambulatory Visit: Payer: Self-pay | Admitting: Family Medicine

## 2023-05-10 ENCOUNTER — Encounter: Payer: Self-pay | Admitting: Family Medicine

## 2023-05-10 ENCOUNTER — Ambulatory Visit (INDEPENDENT_AMBULATORY_CARE_PROVIDER_SITE_OTHER): Payer: Medicare Other | Admitting: Family Medicine

## 2023-05-10 VITALS — BP 108/70 | HR 62 | Temp 98.0°F | Wt 200.6 lb

## 2023-05-10 DIAGNOSIS — R0683 Snoring: Secondary | ICD-10-CM

## 2023-05-10 DIAGNOSIS — Z7984 Long term (current) use of oral hypoglycemic drugs: Secondary | ICD-10-CM | POA: Diagnosis not present

## 2023-05-10 DIAGNOSIS — E785 Hyperlipidemia, unspecified: Secondary | ICD-10-CM

## 2023-05-10 DIAGNOSIS — G629 Polyneuropathy, unspecified: Secondary | ICD-10-CM | POA: Diagnosis not present

## 2023-05-10 DIAGNOSIS — E538 Deficiency of other specified B group vitamins: Secondary | ICD-10-CM | POA: Diagnosis not present

## 2023-05-10 DIAGNOSIS — I10 Essential (primary) hypertension: Secondary | ICD-10-CM | POA: Diagnosis not present

## 2023-05-10 DIAGNOSIS — R400 Somnolence: Secondary | ICD-10-CM

## 2023-05-10 DIAGNOSIS — D72829 Elevated white blood cell count, unspecified: Secondary | ICD-10-CM

## 2023-05-10 DIAGNOSIS — E1169 Type 2 diabetes mellitus with other specified complication: Secondary | ICD-10-CM

## 2023-05-10 DIAGNOSIS — E669 Obesity, unspecified: Secondary | ICD-10-CM | POA: Diagnosis not present

## 2023-05-10 DIAGNOSIS — I519 Heart disease, unspecified: Secondary | ICD-10-CM

## 2023-05-10 DIAGNOSIS — D508 Other iron deficiency anemias: Secondary | ICD-10-CM | POA: Diagnosis not present

## 2023-05-10 DIAGNOSIS — R5383 Other fatigue: Secondary | ICD-10-CM | POA: Diagnosis not present

## 2023-05-10 DIAGNOSIS — K219 Gastro-esophageal reflux disease without esophagitis: Secondary | ICD-10-CM

## 2023-05-10 DIAGNOSIS — E559 Vitamin D deficiency, unspecified: Secondary | ICD-10-CM

## 2023-05-10 LAB — B12 AND FOLATE PANEL
Folate: 14.2 ng/mL (ref >5.9)
Vitamin B-12: 949 pg/mL — ABNORMAL HIGH (ref 211–911)

## 2023-05-10 LAB — LIPID PANEL
Cholesterol: 137 mg/dL (ref 0–200)
HDL: 41.3 mg/dL (ref >39.00)
LDL Cholesterol: 50 mg/dL (ref 0–99)
NonHDL: 96.18
Total CHOL/HDL Ratio: 3
Triglycerides: 231 mg/dL — ABNORMAL HIGH (ref 0.0–149.0)
VLDL: 46.2 mg/dL — ABNORMAL HIGH (ref 0.0–40.0)

## 2023-05-10 LAB — CBC WITH DIFFERENTIAL/PLATELET
Basophils Absolute: 0 10*3/uL (ref 0.0–0.1)
Basophils Relative: 0.3 % (ref 0.0–3.0)
Eosinophils Absolute: 0.4 10*3/uL (ref 0.0–0.7)
Eosinophils Relative: 3.6 % (ref 0.0–5.0)
HCT: 38.6 % (ref 36.0–46.0)
Hemoglobin: 12.4 g/dL (ref 12.0–15.0)
Lymphocytes Relative: 28.3 % (ref 12.0–46.0)
Lymphs Abs: 2.7 10*3/uL (ref 0.7–4.0)
MCHC: 32.1 g/dL (ref 30.0–36.0)
MCV: 82.7 fL (ref 78.0–100.0)
Monocytes Absolute: 0.7 10*3/uL (ref 0.1–1.0)
Monocytes Relative: 7.2 % (ref 3.0–12.0)
Neutro Abs: 5.8 10*3/uL (ref 1.4–7.7)
Neutrophils Relative %: 60.6 % (ref 43.0–77.0)
Platelets: 376 10*3/uL (ref 150.0–400.0)
RBC: 4.67 Mil/uL (ref 3.87–5.11)
RDW: 16.6 % — ABNORMAL HIGH (ref 11.5–15.5)
WBC: 9.6 10*3/uL (ref 4.0–10.5)

## 2023-05-10 LAB — IBC + FERRITIN
Ferritin: 34.8 ng/mL (ref 10.0–291.0)
Iron: 52 ug/dL (ref 42–145)
Saturation Ratios: 12.4 % — ABNORMAL LOW (ref 20.0–50.0)
TIBC: 418.6 ug/dL (ref 250.0–450.0)
Transferrin: 299 mg/dL (ref 212.0–360.0)

## 2023-05-10 LAB — SEDIMENTATION RATE: Sed Rate: 54 mm/h — ABNORMAL HIGH (ref 0–30)

## 2023-05-10 LAB — VITAMIN D 25 HYDROXY (VIT D DEFICIENCY, FRACTURES): VITD: 75.77 ng/mL (ref 30.00–100.00)

## 2023-05-10 LAB — HEMOGLOBIN A1C: Hgb A1c MFr Bld: 7.4 % — ABNORMAL HIGH (ref 4.6–6.5)

## 2023-05-10 NOTE — Patient Instructions (Signed)

## 2023-05-10 NOTE — Progress Notes (Unsigned)
Carol [name] , July 23, 1955, 68 y.o., female MRN: [id number] Patient Care Team    Relationship Specialty Notifications Start End  Natalia Leatherwood, DO PCP - General Family Medicine  06/10/19   Jake Bathe, MD Consulting Physician Cardiology  06/12/19   Iva Boop, MD Consulting Physician Gastroenterology  06/12/19   Elwin Mocha, MD Consulting Physician Ophthalmology  06/12/19   Frankey Poot, MD Referring Physician Obstetrics and Gynecology  06/12/19   Jessica Priest, MD Consulting Physician Allergy and Immunology  06/12/19   [facility]  07/26/21   Larey Dresser, DPM Consulting Physician Podiatry  05/10/23     Chief Complaint  Patient presents with   Fatigue    2-3 weeks     Subjective: Carol [name] is a 68 y.o. Pt presents for for fatigue and Chronic Conditions/illness Management Fatigue:*** Prior note:  an OV with complaints of fatigue worsening over the last 2-3 months duration.  Associated symptoms include never feeling rested.  She reports she can sleep 6 to 16 hours a night and would not not wake up feeling rested.  When she wakes up she takes a shower and starts her day and is already ready for a nap.  She is uncertain if she snores at nighttime.  She does fall asleep sitting in the chair on occasions and will wake herself snoring.  2 of her sisters have sleep apnea. BMI >30. She is a diabetic and also treated for hypertension.  She reports her sugars have been elevated over 160 routinely recently with out identifiable cause.  Her diet has not changed. She has had neuropathy in which she is now seeing a podiatrist who is prescribing her neuremedy-which is "medical grade "thiamine.  She has noticed an improvement in her feet discomfort since starting medication 8 weeks ago.  She has had recurrent UTIs, flares of irritable bowel  Type 2 diabetes mellitus without complication, without long-term current use of insulin  (HCC)/obesity Pt reports compliance with metformin 750 mg daily and tradjenta 5 mg QD.  Patient denies dizziness, hyperglycemic or hypoglycemic events.  Patient denies dizziness, hyperglycemic or hypoglycemic events. Patient denies numbness, tingling in the extremities or nonhealing wounds of feet.     Essential hypertension/HTN Pt reports compliance with Toprol-XL 25, candesartan-HCTZ 16-12.5 mg daily, Crestor 40 mg daily and baby aspirin. Patient denies chest pain, shortness of breath, dizziness or lower extremity edema.   RF: Hypertension, hyperlipidemia, diabetic, obesity, family history.    Gastroesophageal reflux disease without esophagitis Patient reports symptoms are well-controlled on  her omeprazole 20 mg daily.  She has been unable to discontinue medication in the past without recurrence of symptoms immediately.   Hot flashes: Patient reports celexa 20 mg qd is very helpful  Iron deficiency anemia: Continues the iron supplement every other day.   Hypercalcemia: Patient was found to have very mild hypercalcemia of 10.5, 10.4 is normal.  She was encouraged to hydrate and avoid any added calcium.Marland Kitchen  Her PTH was normal.  Repeat have been stable  Recurrent UTI/IBS/fecal incontinence: Patient has been suffering from recurrent UTIs.  At one time we had placed her on Macrobid nightly for prophylaxis, she states she has not done this in some time.  She unfortunately has fecal urgency at times causing her to to have incontinence, then this seems to be associated with developing a UTI      02/13/2023    1:19 PM 09/02/2022  8:53 AM 08/02/2022    2:23 PM 07/13/2022    9:33 AM 01/10/2022    8:25 AM  Depression screen PHQ 2/9  Decreased Interest 1 0 0 0 0  Down, Depressed, Hopeless 0 0 0 0 0  PHQ - 2 Score 1 0 0 0 0  Altered sleeping 0 0     Tired, decreased energy 3 0     Change in appetite 0 0     Feeling bad or failure about yourself  0 0     Trouble concentrating 0 0     Moving  slowly or fidgety/restless 0 0     Suicidal thoughts 0 0     PHQ-9 Score 4 0     Difficult doing work/chores Not difficult at all Not difficult at all       Allergies  Allergen Reactions   Ivp Dye [Iodinated Contrast Media] Hives and Shortness Of Breath    Did ok in OP setting with 13 hr Premedications    Latanoprost    Lisinopril Cough   Onion Rash    Rash and GI upset   Sulfa Antibiotics Hives   Social History   Social History Narrative   Marital status/children/pets: Married.   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   Safety:      -smoke alarm in the home:Yes     - wears seatbelt: Yes     - Feels safe in their relationships: Yes   Past Medical History:  Diagnosis Date   Angio-edema    Blood type O+    Chest discomfort    , Atypical   Chicken pox    COVID-19 05/09/2019   Diabetic acidosis, type II (HCC)    Diverticulitis - large vs small bowel vs both?    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  "Normal "performed by Dr. Ty Hilts   COLONOSCOPY  04/03/2019   LAPAROTOMY N/A 11/05/2013   Procedure: EXPLORATORY LAPAROTOMY;  Surgeon: Cherylynn Ridges, MD;  Location: [facility] OR;  Service: General;  Laterality: N/A;   Lexiscan  2010   Patient reports normal Lexiscan completed around 2010 by Dr. Ty Hilts.   TOTAL ABDOMINAL HYSTERECTOMY  2004   Family History  Problem Relation Age of Onset   Heart disease Mother    Heart attack Mother    Asthma Mother    Early death Mother     Miscarriages / Stillbirths Mother    Heart disease Father    Lung cancer Father    Heart attack Brother    Asthma Brother    Heart disease Brother    Kidney disease Brother    Diverticulitis Sister    Heart disease Brother    Heart attack Brother    Heart disease Brother    Heart attack Brother    Early death Maternal Grandmother    Tuberculosis Maternal Grandmother    Depression Maternal Grandfather    Heart disease Paternal Grandmother    Heart disease  Sister    Colon cancer Neg Hx    Esophageal cancer Neg Hx    Rectal cancer Neg Hx    Stomach cancer Neg Hx    Allergies as of 05/10/2023       Reactions   Ivp Dye [iodinated Contrast Media] Hives, Shortness Of Breath   Did ok in OP setting with 13 hr Premedications    Latanoprost    Lisinopril Cough   Onion Rash   Rash and GI upset   Sulfa Antibiotics Hives        Medication List        Accurate as of May 10, 2023  7:54 AM. If you have any questions, ask your nurse or doctor.          aspirin 81 MG tablet Take 81 mg by mouth at bedtime.   candesartan-hydrochlorothiazide 16-12.5 MG tablet Commonly known as: ATACAND HCT Take 1 tablet by mouth daily.   cetirizine 10 MG tablet Commonly known as: ZYRTEC Take 10 mg by mouth at bedtime.   citalopram 20 MG tablet Commonly known as: CELEXA Take 1 tablet (20 mg total) by mouth daily.   dicyclomine 10 MG capsule Commonly known as: BENTYL Take 1 capsule (10 mg total) by mouth 4 (four) times daily -  before meals and at bedtime.   Ferrex 150 150 MG capsule Generic drug: iron polysaccharides Take 1 capsule (150 mg total) by mouth daily.   fluocinonide ointment 0.05 % Commonly known as: LIDEX Apply 1 Application topically 2 (two) times daily.   fluticasone 50 MCG/ACT nasal spray Commonly known as: FLONASE Place 2 sprays into both nostrils daily.   linagliptin 5 MG Tabs tablet Commonly known as: Tradjenta Take 1 tablet (5 mg total) by mouth daily.  Needs OV for refills   metFORMIN 750 MG 24 hr tablet Commonly known as: GLUCOPHAGE-XR Take 1 tablet (750 mg total) by mouth daily with breakfast.   metoprolol succinate 25 MG 24 hr tablet Commonly known as: TOPROL-XL Take 1 tablet (25 mg total) by mouth daily. Take with or immediately following a meal. Needs OV for refills   omeprazole 20 MG capsule Commonly known as: PRILOSEC Take 1 capsule (20 mg total) by mouth daily.   OneTouch Delica Plus Lancet33G Misc check blood sugar TWICE DAILY   OneTouch Ultra test strip Generic drug: glucose blood test sugars TWICE DAILY as directed   rosuvastatin 40 MG tablet Commonly known as: CRESTOR Take 1 tablet (40 mg total) by mouth at bedtime.   timolol 0.25 % ophthalmic solution Commonly known as: TIMOPTIC Place 1 drop into the left eye 2 (two) times daily.   Travoprost (BAK Free) 0.004 % Soln ophthalmic solution Commonly known as: TRAVATAN Place 1 drop into both eyes at bedtime.   Uribel 118 MG Caps Take 1 capsule (118 mg total) by mouth in the morning, at noon, in the evening, and at bedtime. Take as needed for urinary pain and frequency.   vitamin C 1000 MG tablet Take 1,000 mg by mouth daily.        All past medical history, surgical history, allergies, family history, immunizations andmedications were updated in the EMR today and reviewed under the history and medication portions of their EMR.     ROS Negative, with the exception of above mentioned in HPI   Objective:  BP 108/70   Pulse 62   Temp 98 F (36.7 C)   Wt 200 lb 9.6 oz (91 kg)   SpO2 95%  BMI 35.53 kg/m  Body mass index is 35.53 kg/m. Physical Exam Vitals and nursing note reviewed.  Constitutional:      General: She is not in acute distress.    Appearance: Normal appearance. She is obese. She is not ill-appearing, toxic-appearing or diaphoretic.  HENT:     Head: Normocephalic and atraumatic.  Eyes:     General: No scleral icterus.       Right  eye: No discharge.        Left eye: No discharge.     Extraocular Movements: Extraocular movements intact.     Conjunctiva/sclera: Conjunctivae normal.     Pupils: Pupils are equal, round, and reactive to light.  Cardiovascular:     Rate and Rhythm: Normal rate and regular rhythm.     Heart sounds: Murmur heard.  Pulmonary:     Effort: Pulmonary effort is normal. No respiratory distress.     Breath sounds: Normal breath sounds. No wheezing, rhonchi or rales.  Musculoskeletal:     Right lower leg: No edema.     Left lower leg: No edema.  Skin:    General: Skin is warm.     Findings: No rash.  Neurological:     Mental Status: She is alert and oriented to person, place, and time. Mental status is at baseline.     Motor: No weakness.     Gait: Gait normal.  Psychiatric:        Mood and Affect: Mood normal.        Behavior: Behavior normal.        Thought Content: Thought content normal.        Judgment: Judgment normal.     No results found. No results found. No results found for this or any previous visit (from the past 24 hours).  Assessment/Plan: Carol [name] is a 68 y.o. female present for OV for  iron deficiency anemia Currently supplementing*every other day with Ferrex 150  Fatigue Fatigue onset greater than 9 months now. ***Consider sleep apnea eval if laboratory workup does not identify likely cause of increased fatigue.  - CBC w/Diff-mild elevation in WBC, repeat today-leukocytosis - TSH-within normal limits 01/2023, TPO normal - Comp Met (CMET) within normal limits 01/2023.  History of hypercalcemia, that remains in normal range when she is hydrating well. - Vitamin D (25 hydroxy) insufficiency-mildly lower than normal (28) - B12 (deficiency) and Folate Panel-folate normal, B12 deficient 01/2023-started B12 injections and then sublingual - IBC + Ferritin-borderline iron levels, borderline ferritin, low saturations with supplement -ANA, ESR, SPEP,  PTH  Neuropathy Continue B12 Continue high-dose thiamine by specialty team it seems to be helping with her neuropathy  Type 2 diabetes mellitus with hyperlipidemia (HCC)/morbid obesity Stable Continue metformin 750 mg daily Continue tradjenta 5 mg -refill after lab results and appropriate dose. Continue Crestor 40 mg daily Diabetic diet and routine exercise recommended PNA series: Pneumonia series completed Flu shot: Up-to-date 2024 (recommneded yearly) Foot exam:  Completed 05/10/2023 Eye exam: Up-to-date 02/06/2023 Dr. Sherryll Burger- record requested A1c: 6.9> 6.1>7.2>5.8 > 6.4 >6.3 >6.2> 6.7> 5.7 > A1c collected today Urine microalbumin UTD 01/2023   Essential hypertension/HLD/obesity Stable  continue metoprolol XL at a decreased dose of half tab daily (25 mg dose) Continue candesartan/HCTZ 16-12.5 mg Continue Crestor 40 mg daily Continue baby aspirin daily Labs due next visit  Gastroesophageal reflux disease without esophagitis Stable Continue omeprazole 20 mg daily   Hot flashes: Stable Continue Celexa 20 mg daily  Hypercalcemia She is continuing  to hydrate.  Has been stable  Recurrent UTI/gross hematuria/fecal incontinence/IBS: Patient is established with urology fecal incontinence/IBS and started Bentyl prior to meals in which the bathroom is not close such as when eating in restaurants.   Reviewed expectations re: course of current medical issues. Discussed self-management of symptoms. Outlined signs and symptoms indicating need for more acute intervention. Patient verbalized understanding and all questions were answered. Patient received an After-Visit Summary.    No orders of the defined types were placed in this encounter.  No orders of the defined types were placed in this encounter.  Referral Orders  No referral(s) requested today     Note is dictated utilizing voice recognition software. Although note has been proof read prior to signing, occasional  typographical errors still can be missed. If any questions arise, please do not hesitate to call for verification.   electronically signed by:  Felix Pacini, DO  [facility] - OR

## 2023-05-11 ENCOUNTER — Telehealth: Payer: Self-pay | Admitting: Family Medicine

## 2023-05-11 MED ORDER — METFORMIN HCL ER 750 MG PO TB24: 750.0000 mg | ORAL_TABLET | Freq: Every day | ORAL | 1 refills | Status: DC

## 2023-05-11 MED ORDER — METOPROLOL SUCCINATE ER 25 MG PO TB24: 25.0000 mg | ORAL_TABLET | Freq: Every day | ORAL | 1 refills | Status: DC

## 2023-05-11 MED ORDER — CANDESARTAN CILEXETIL-HCTZ 16-12.5 MG PO TABS: 1.0000 | ORAL_TABLET | Freq: Every day | ORAL | 1 refills | Status: DC

## 2023-05-11 MED ORDER — CITALOPRAM HYDROBROMIDE 20 MG PO TABS: 20.0000 mg | ORAL_TABLET | Freq: Every day | ORAL | 1 refills | Status: DC

## 2023-05-11 MED ORDER — FLUTICASONE PROPIONATE 50 MCG/ACT NA SUSP: 2.0000 | Freq: Every day | NASAL | 6 refills | Status: AC

## 2023-05-11 MED ORDER — OMEPRAZOLE 20 MG PO CPDR: 20.0000 mg | DELAYED_RELEASE_CAPSULE | Freq: Every day | ORAL | 1 refills | Status: DC

## 2023-05-11 MED ORDER — FERREX 150 150 MG PO CAPS: 150.0000 mg | ORAL_CAPSULE | Freq: Every day | ORAL | 3 refills | Status: DC

## 2023-05-11 MED ORDER — ROSUVASTATIN CALCIUM 40 MG PO TABS: 40.0000 mg | ORAL_TABLET | Freq: Every day | ORAL | 3 refills | Status: DC

## 2023-05-11 MED ORDER — EMPAGLIFLOZIN 25 MG PO TABS: 25.0000 mg | ORAL_TABLET | Freq: Every day | ORAL | 5 refills | Status: DC

## 2023-05-11 NOTE — Telephone Encounter (Signed)
Please call patient: Parathyroid hormone is normal Iron is still stable, on the low side it is normal levels.  Continue iron supplementation, if taking every other day you may want to consider taking 5 times a week to get a few doses.  May want to consider taking a vitamin C tablet with the iron tab for better absorption, also. Vitamin D and B12 levels are now great. Blood cell counts are normal  Cholesterol panel is at goal with the exception of elevated triglycerides.  If she was not fasting for 9 hours this can appear high, and not be concerning.  If she was fasting then this is mildly elevated.  Decreasing saturated fats by focusing on diet full of lean meat, fish and fresh fruits veggies, along with routine exercise will help decrease triglycerides  Inflammatory marker/sed rate is elevated at 54.  We will know more about a potential cause once we receive the results of the ANA panel and SPEP panel.  These take a few days to a week to return.  A1c is elevated up to 7.4.  Continue metformin and in place of Tradjenta, I would like to start a prescription called Jardiance.  If this is covered/affordable for her this medication helps control diabetes better and as the added heart protection for her.   If not affordable (after prior authorization if 1 is needed close), we would need to know right away so that we can devise another plan   -Continue the Tradjenta if she has any left for now, but when she picks up the Jardiance she will stop the [place] and start the Jardiance the following day.   Follow-up on chronic conditions in 3-4 months.  Once we get the remaining lab results we will call her with those and discuss further plan on her fatigue.

## 2023-05-12 LAB — PTH, INTACT AND CALCIUM
Calcium: 10.4 mg/dL (ref 8.6–10.4)
PTH: 28 pg/mL (ref 16–77)

## 2023-05-12 LAB — ANA, IFA COMPREHENSIVE PANEL
Anti Nuclear Antibody (ANA): POSITIVE — AB
ENA SM Ab Ser-aCnc: <1 AI
SM/RNP: <1 AI
SSA (Ro) (ENA) Antibody, IgG: <1 AI
SSB (La) (ENA) Antibody, IgG: <1 AI
Scleroderma (Scl-70) (ENA) Antibody, IgG: <1 AI
ds DNA Ab: 1 [IU]/mL

## 2023-05-12 LAB — ANTI-NUCLEAR AB-TITER (ANA TITER): ANA Titer 1: 1:80 {titer} — ABNORMAL HIGH

## 2023-05-12 LAB — PROTEIN ELECTROPHORESIS, SERUM
Albumin ELP: 3.9 g/dL (ref 3.8–4.8)
Alpha 1: 0.3 g/dL (ref 0.2–0.3)
Alpha 2: 0.8 g/dL (ref 0.5–0.9)
Beta 2: 0.7 g/dL — ABNORMAL HIGH (ref 0.2–0.5)
Beta Globulin: 0.6 g/dL (ref 0.4–0.6)
Gamma Globulin: 1.2 g/dL (ref 0.8–1.7)
Total Protein: 7.4 g/dL (ref 6.1–8.1)

## 2023-05-15 ENCOUNTER — Telehealth: Payer: Self-pay | Admitting: Family Medicine

## 2023-05-15 DIAGNOSIS — R5383 Other fatigue: Secondary | ICD-10-CM

## 2023-05-15 DIAGNOSIS — R768 Other specified abnormal immunological findings in serum: Secondary | ICD-10-CM | POA: Insufficient documentation

## 2023-05-15 DIAGNOSIS — R778 Other specified abnormalities of plasma proteins: Secondary | ICD-10-CM | POA: Insufficient documentation

## 2023-05-15 NOTE — Telephone Encounter (Signed)
Please call patient: Vitamin D and B12 levels are normal. SPEP shows a mild increase in 1 band called beta-2.  This can be mildly elevated from an inflammatory response, but we need to collect an additional lab and send to [facility]: Immunofixation of her serum ordered to further evaluate.   -Please schedule her for lab appointment only  Lastly, her ANA is positive, very weakly positive at 1:80.  Her reflex panel for this laboratory test is all normal.  This is not a strong positive test, but we we will go ahead and refer her to rheumatology to further evaluate, since she had an elevated inflammatory marker as well and symptoms of fatigue

## 2023-05-16 ENCOUNTER — Encounter: Payer: Self-pay | Admitting: Family Medicine

## 2023-05-16 NOTE — Telephone Encounter (Signed)
 MyChart message sent with results.

## 2023-05-16 NOTE — Telephone Encounter (Signed)
Left vm for pt to return my call.  

## 2023-05-26 ENCOUNTER — Other Ambulatory Visit: Payer: Self-pay | Admitting: Family Medicine

## 2023-05-26 DIAGNOSIS — E1169 Type 2 diabetes mellitus with other specified complication: Secondary | ICD-10-CM

## 2023-05-31 ENCOUNTER — Other Ambulatory Visit: Payer: Medicare Other

## 2023-06-01 ENCOUNTER — Other Ambulatory Visit: Payer: Medicare Other

## 2023-06-01 DIAGNOSIS — R778 Other specified abnormalities of plasma proteins: Secondary | ICD-10-CM

## 2023-06-01 DIAGNOSIS — R768 Other specified abnormal immunological findings in serum: Secondary | ICD-10-CM

## 2023-06-02 ENCOUNTER — Encounter: Payer: Self-pay | Admitting: Family Medicine

## 2023-06-02 LAB — RHEUMATOID FACTOR: Rheumatoid fact SerPl-aCnc: 11 [IU]/mL (ref <14)

## 2023-06-02 LAB — CYCLIC CITRUL PEPTIDE ANTIBODY, IGG: Cyclic Citrullin Peptide Ab: <16 U

## 2023-06-06 LAB — IMMUNOFIXATION, SERUM
IgA/Immunoglobulin A, Serum: 581 mg/dL — ABNORMAL HIGH (ref 87–352)
IgG (Immunoglobin G), Serum: 1268 mg/dL (ref 586–1602)
IgM (Immunoglobulin M), Srm: 81 mg/dL (ref 26–217)

## 2023-06-12 ENCOUNTER — Telehealth: Payer: Self-pay | Admitting: Family Medicine

## 2023-06-12 NOTE — Telephone Encounter (Signed)
 Please call patient If patient is still having fecal urgency and incontinence, diarrhea/loose stools I would recommend she make an appointment with her gastroenterology team for further evaluation, especially with the elevated IgA levels.  If she needs [place] to place a new referral to them, who would be happy to do so.  However she could call and make an appointment as well.

## 2023-06-13 DIAGNOSIS — M65312 Trigger thumb, left thumb: Secondary | ICD-10-CM | POA: Diagnosis not present

## 2023-06-13 NOTE — Telephone Encounter (Signed)
LVM for to return call 

## 2023-06-14 ENCOUNTER — Telehealth: Payer: Self-pay

## 2023-06-14 NOTE — Telephone Encounter (Signed)
 Pt advised recommendations were only if she was still experiencing symptoms. She will call back if anything changes

## 2023-06-14 NOTE — Telephone Encounter (Signed)
 Copied from CRM [id number]. Topic: General - Other >> Jun 13, 2023  3:09 PM Denese Killings wrote: Reason for CRM: Patient is returning a call from the nurse. She states that there is a mix up because she is not experiencing any of those problems from message left.

## 2023-06-23 ENCOUNTER — Ambulatory Visit (HOSPITAL_BASED_OUTPATIENT_CLINIC_OR_DEPARTMENT_OTHER)
Admission: RE | Admit: 2023-06-23 | Discharge: 2023-06-23 | Disposition: A | Discharge status: Home / Self Care | Payer: Self-pay | Source: Ambulatory Visit | Attending: Family Medicine | Admitting: Family Medicine

## 2023-06-23 DIAGNOSIS — E785 Hyperlipidemia, unspecified: Secondary | ICD-10-CM | POA: Insufficient documentation

## 2023-06-23 DIAGNOSIS — E1169 Type 2 diabetes mellitus with other specified complication: Secondary | ICD-10-CM | POA: Insufficient documentation

## 2023-06-23 DIAGNOSIS — E669 Obesity, unspecified: Secondary | ICD-10-CM | POA: Insufficient documentation

## 2023-06-23 DIAGNOSIS — I10 Essential (primary) hypertension: Secondary | ICD-10-CM | POA: Insufficient documentation

## 2023-06-26 ENCOUNTER — Encounter: Payer: Self-pay | Admitting: Family Medicine

## 2023-06-26 ENCOUNTER — Telehealth: Payer: Self-pay | Admitting: Family Medicine

## 2023-06-26 DIAGNOSIS — R931 Abnormal findings on diagnostic imaging of heart and coronary circulation: Secondary | ICD-10-CM | POA: Insufficient documentation

## 2023-06-26 NOTE — Telephone Encounter (Signed)
 Pt given results

## 2023-06-26 NOTE — Telephone Encounter (Signed)
 Please call patient Carol [name] cardiac CT results have returned and they are elevated into the 72nd percentile for 68 year old Caucasian female. Carol [name] coronary calcium score was 133. -She is prescribed rosuvastatin-Crestor which is due for step in treating an elevated calcium score.  I Did Encourage Carol [name] Discussed Results Further with Carol [name] Cardiology Team

## 2023-06-28 ENCOUNTER — Ambulatory Visit (INDEPENDENT_AMBULATORY_CARE_PROVIDER_SITE_OTHER): Admitting: Nurse Practitioner

## 2023-06-28 ENCOUNTER — Encounter: Payer: Self-pay | Admitting: Nurse Practitioner

## 2023-06-28 VITALS — BP 130/64 | HR 54 | Ht 63.0 in | Wt 198.8 lb

## 2023-06-28 DIAGNOSIS — E669 Obesity, unspecified: Secondary | ICD-10-CM

## 2023-06-28 DIAGNOSIS — Z6835 Body mass index (BMI) 35.0-35.9, adult: Secondary | ICD-10-CM

## 2023-06-28 DIAGNOSIS — G4719 Other hypersomnia: Secondary | ICD-10-CM | POA: Insufficient documentation

## 2023-06-28 NOTE — Patient Instructions (Signed)
 Given your symptoms, I am concerned that you may have sleep disordered breathing with sleep apnea. You will need a sleep study for further evaluation. Someone will contact you to schedule this.   We discussed how untreated sleep apnea puts an individual at risk for cardiac arrhthymias, pulm HTN, DM, stroke and increases their risk for daytime accidents. We also briefly reviewed treatment options including weight loss, side sleeping position, oral appliance, CPAP therapy or referral to ENT for possible surgical options  Use caution when driving and pull over if you become sleepy.  Follow up in 6 weeks with Katie Emmanuell Kantz,NP to go over sleep study results, or sooner, if needed. Friday PM virtual clinic preferred

## 2023-06-28 NOTE — Assessment & Plan Note (Signed)
 BMI 35. Healthy weight loss encouraged.

## 2023-06-28 NOTE — Assessment & Plan Note (Signed)
 She has snoring, excessive daytime sleepiness, restless sleep. BMI 35. History of HTN, DM. Epworth 13. Given this,  I am concerned she could have sleep disordered breathing with obstructive sleep apnea. She will need sleep study for further evaluation.    - discussed how weight can impact sleep and risk for sleep disordered breathing - discussed options to assist with weight loss: combination of diet modification, cardiovascular and strength training exercises   - had an extensive discussion regarding the adverse health consequences related to untreated sleep disordered breathing - specifically discussed the risks for hypertension, coronary artery disease, cardiac dysrhythmias, cerebrovascular disease, and diabetes - lifestyle modification discussed   - discussed how sleep disruption can increase risk of accidents, particularly when driving - safe driving practices were discussed  Patient Instructions  Given your symptoms, I am concerned that you may have sleep disordered breathing with sleep apnea. You will need a sleep study for further evaluation. Someone will contact you to schedule this.   We discussed how untreated sleep apnea puts an individual at risk for cardiac arrhthymias, pulm HTN, DM, stroke and increases their risk for daytime accidents. We also briefly reviewed treatment options including weight loss, side sleeping position, oral appliance, CPAP therapy or referral to ENT for possible surgical options  Use caution when driving and pull over if you become sleepy.  Follow up in 6 weeks with Katie Cresencia Asmus,NP to go over sleep study results, or sooner, if needed. Friday PM virtual clinic preferred

## 2023-06-28 NOTE — Progress Notes (Signed)
 @Patient  ID: Carol [name], female    DOB: 1955-08-13, 68 y.o.   MRN: [id number]  Chief Complaint  Patient presents with   Follow-up    Sleep consult     Referring provider: Natalia Leatherwood, DO  HPI: 68 year old female, never smoker referred for sleep consult. Past medical history significant for HTN, GERD, IBS, DM, HLD, obesity, diastolic dysfunction.   TEST/EVENTS:   06/28/2023: Today - follow up Discussed the use of AI scribe software for clinical note transcription with the patient, who gave verbal consent to proceed.  History of Present Illness   Carol [name] is a 68 year old female who presents with daytime fatigue and suspected sleep apnea.  She experiences significant daytime fatigue and morning grogginess. She wakes up not feeling well-rested. She snores when she falls asleep in a chair with her head down, waking herself up, but is unsure about snoring at night. Her niece has noted shallow breathing during sleep. No history of drowsy driving, morning headaches, sleepwalking, or narcolepsy. She has experienced sleep paralysis in the past but not recently.  She reports nasal congestion at night, which sometimes requires her to get up and blow her nose. She uses a nasal spray, Flonase, before bed if she feels stuffy, which she finds helpful.   She does not take any sleep medications and typically reads for about an hour before falling asleep. She wakes up one to two times a night to use the bathroom. Goes to bed around 8-9 pm. Gets up around 6 am. No weight change over last 2 years. Never had a previous sleep study. No supplemental oxygen use. 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   She consumes about one alcoholic beverage a month, if any, and tries to avoid caffeine. She works as a [employer] Services operational officer. Lives with her husband.  Family history of asthma, heart disease, father with lung cancer.  Epworth 13      Allergies  Allergen Reactions   Ivp Dye [Iodinated  Contrast Media] Hives and Shortness Of Breath    Did ok in OP setting with 13 hr Premedications    Latanoprost    Lisinopril Cough   Onion Rash    Rash and GI upset   Sulfa Antibiotics Hives    Immunization History  Administered Date(s) Administered   Fluad Quad(high Dose 65+) 02/21/2020, 01/06/2021, 03/02/2022, 12/22/2022   Influenza,inj,Quad PF,6+ Mos 03/30/2019   PFIZER(Purple Top)SARS-COV-2 Vaccination 07/02/2019, 07/30/2019, 01/19/2020   Pfizer Covid-19 Vaccine Bivalent Booster 74yrs & up 02/12/2021   Pneumococcal Conjugate-13 03/11/2020   Pneumococcal Polysaccharide-23 03/24/2021   Tdap 08/27/2012, 12/22/2022   Zoster Recombinant(Shingrix) 11/05/2019, 02/03/2020    Past Medical History:  Diagnosis Date   Angio-edema    Blood type O+    Chest discomfort    , Atypical   Chicken pox    COVID-19 05/09/2019   Diabetic acidosis, type II (HCC)    Diverticulitis - large vs small bowel vs both?    Gastroesophageal reflux disease    Glaucoma    Hyperlipidemia    Hypertension    PVC (premature ventricular contraction)    Systolic dysfunction    , Hyperdynamic    Tobacco History: Social History   Tobacco Use  Smoking Status Never   Passive exposure: Never  Smokeless Tobacco Never   Counseling given: Not Answered   Outpatient Medications Prior to Visit  Medication Sig Dispense Refill   Ascorbic Acid (VITAMIN C) 1000 MG tablet Take 1,000  mg by mouth daily.     aspirin 81 MG tablet Take 81 mg by mouth at bedtime.      candesartan-hydrochlorothiazide (ATACAND HCT) 16-12.5 MG tablet Take 1 tablet by mouth daily. 90 tablet 1   cetirizine (ZYRTEC) 10 MG tablet Take 10 mg by mouth at bedtime.      citalopram (CELEXA) 20 MG tablet Take 1 tablet (20 mg total) by mouth daily. 90 tablet 1   empagliflozin (JARDIANCE) 25 MG TABS tablet Take 1 tablet (25 mg total) by mouth daily. 30 tablet 5   FERREX 150 150 MG capsule Take 1 capsule (150 mg total) by mouth daily. 90 capsule 3    fluocinonide ointment (LIDEX) 0.05 % Apply 1 Application topically 2 (two) times daily. 30 g 2   fluticasone (FLONASE) 50 MCG/ACT nasal spray Place 2 sprays into both nostrils daily. 16 g 6   Lancets (ONETOUCH DELICA PLUS LANCET33G) MISC check blood sugar TWICE DAILY 100 each 6   linagliptin (TRADJENTA) 5 MG TABS tablet Take 1 tablet (5 mg total) by mouth daily 90 tablet 1   metFORMIN (GLUCOPHAGE-XR) 750 MG 24 hr tablet Take 1 tablet (750 mg total) by mouth daily with breakfast. 90 tablet 1   metoprolol succinate (TOPROL-XL) 25 MG 24 hr tablet Take 1 tablet (25 mg total) by mouth daily. Take with or immediately following a meal. Needs OV for refills 90 tablet 1   omeprazole (PRILOSEC) 20 MG capsule Take 1 capsule (20 mg total) by mouth daily. 90 capsule 1   ONETOUCH ULTRA TEST test strip test sugars TWICE DAILY as directed 50 strip 6   rosuvastatin (CRESTOR) 40 MG tablet Take 1 tablet (40 mg total) by mouth at bedtime. 90 tablet 3   timolol (TIMOPTIC) 0.25 % ophthalmic solution Place 1 drop into the left eye 2 (two) times daily.     Travoprost, BAK Free, (TRAVATAN) 0.004 % SOLN ophthalmic solution Place 1 drop into both eyes at bedtime.     dicyclomine (BENTYL) 10 MG capsule Take 1 capsule (10 mg total) by mouth 4 (four) times daily -  before meals and at bedtime. (Patient not taking: Reported on 05/10/2023) 120 capsule 1   Meth-Hyo-M Bl-Na Phos-Ph Sal (URIBEL) 118 MG CAPS Take 1 capsule (118 mg total) by mouth in the morning, at noon, in the evening, and at bedtime. Take as needed for urinary pain and frequency. (Patient not taking: Reported on 05/10/2023) 30 capsule 0   No facility-administered medications prior to visit.     Review of Systems:   Constitutional: No weight loss or gain, night sweats, fevers, chills, or lassitude. +fatigue  HEENT: No headaches, difficulty swallowing, tooth/dental problems, or sore throat. No sneezing, itching, ear ache +occasional nasal congestion, post nasal  drip CV:  No chest pain, orthopnea, PND, swelling in lower extremities, anasarca, dizziness, palpitations, syncope Resp: +snoring. No shortness of breath with exertion or at rest. No excess mucus or change in color of mucus. No productive or non-productive. No hemoptysis. No wheezing.  No chest wall deformity GI:  No heartburn, indigestion GU: No nocturia  Skin: No rash, lesions, ulcerations MSK:  No joint pain or swelling.   Neuro: No dizziness or lightheadedness.  Psych: No depression or anxiety. Mood stable.     Physical Exam:  BP 130/64 (BP Location: Left Arm, Patient Position: Sitting, Cuff Size: Normal)   Pulse (!) 54   Ht 5\' 3"  (1.6 m)   Wt 198 lb 12.8 oz (90.2 kg)  SpO2 95%   BMI 35.22 kg/m   GEN: Pleasant, interactive, well-appearing; obese; in no acute distress HEENT:  Normocephalic and atraumatic. PERRLA. Sclera white. Nasal turbinates pink, moist and patent bilaterally. No rhinorrhea present. Oropharynx pink and moist, without exudate or edema. No lesions, ulcerations, or postnasal drip. Mallampati II NECK:  Supple w/ fair ROM. Thyroid symmetrical with no goiter or nodules palpated. No lymphadenopathy.   CV: RRR, no m/r/g, no peripheral edema. Pulses intact, +2 bilaterally. No cyanosis, pallor or clubbing. PULMONARY:  Unlabored, regular breathing. Clear bilaterally A&P w/o wheezes/rales/rhonchi. No accessory muscle use.  GI: BS present and normoactive. Soft, non-tender to palpation. No organomegaly or masses detected.  MSK: No erythema, warmth or tenderness. Cap refil <2 sec all extrem.   Neuro: A/Ox3. No focal deficits noted.   Skin: Warm, no lesions or rashe Psych: Normal affect and behavior. Judgement and thought content appropriate.     Lab Results:  CBC    Component Value Date/Time   WBC 9.6 05/10/2023 0812   RBC 4.67 05/10/2023 0812   HGB 12.4 05/10/2023 0812   HCT 38.6 05/10/2023 0812   PLT 376.0 05/10/2023 0812   MCV 82.7 05/10/2023 0812   MCH 27.3  03/21/2022 1639   MCHC 32.1 05/10/2023 0812   RDW 16.6 (H) 05/10/2023 0812   LYMPHSABS 2.7 05/10/2023 0812   MONOABS 0.7 05/10/2023 0812   EOSABS 0.4 05/10/2023 0812   BASOSABS 0.0 05/10/2023 0812    BMET    Component Value Date/Time   NA 138 01/11/2023 1440   K 4.3 01/11/2023 1440   CL 100 01/11/2023 1440   CO2 29 01/11/2023 1440   GLUCOSE 146 (H) 01/11/2023 1440   BUN 23 01/11/2023 1440   CREATININE 0.97 01/11/2023 1440   CREATININE 0.68 11/18/2020 1333   CALCIUM 10.4 05/10/2023 0812   GFRNONAA >60 03/21/2022 1639   GFRAA >60 02/13/2019 0449    BNP No results found for: "BNP"   Imaging:  CT CARDIAC SCORING (SELF PAY ONLY) Result Date: 06/23/2023 CLINICAL DATA:  Cardiovascular Disease Risk stratification EXAM: Coronary Calcium Score TECHNIQUE: A gated, non-contrast computed tomography scan of the heart was performed using 3mm slice thickness. Axial images were analyzed on a dedicated workstation. Calcium scoring of the coronary arteries was performed using the Agatston method. MEDICATIONS: MEDICATIONS None FINDINGS: Coronary arteries: Normal origins. Coronary Calcium Score: Left main: 0 Left anterior descending artery: 67.5 Left circumflex artery: 7.48 Right coronary artery: 57.7 Total: 133 Percentile: 72nd Pericardium: Normal. Ascending Aorta: Normal caliber. Non-cardiac: See separate report from [facility]. IMPRESSION: Coronary calcium score of 133 Agatston units. This was 72nd percentile for age-, race-, and sex-matched controls. RECOMMENDATIONS: Coronary artery calcium (CAC) score is a strong predictor of incident coronary heart disease (CHD) and provides predictive information beyond traditional risk factors. CAC scoring is reasonable to use in the decision to withhold, postpone, or initiate statin therapy in intermediate-risk or selected borderline-risk asymptomatic adults (age 68-75 years and LDL-C >=70 to <190 mg/dL) who do not have diabetes or established  atherosclerotic cardiovascular disease (ASCVD).* In intermediate-risk (10-year ASCVD risk >=7.5% to <20%) adults or selected borderline-risk (10-year ASCVD risk >=5% to <7.5%) adults in whom a CAC score is measured for the purpose of making a treatment decision the following recommendations have been made: If CAC=0, it is reasonable to withhold statin therapy and reassess in 5 to 10 years, as long as higher risk conditions are absent (diabetes mellitus, family history of premature CHD in first degree relatives (males <  55 years; females <65 years), cigarette smoking, or LDL >=190 mg/dL). If CAC is 1 to 99, it is reasonable to initiate statin therapy for patients >=30 years of age. If CAC is >=100 or >=75th percentile, it is reasonable to initiate statin therapy at any age. Cardiology referral should be considered for patients with CAC scores >=400 or >=75th percentile. *2018 AHA/ACC/AACVPR/AAPA/ABC/ACPM/ADA/AGS/APhA/ASPC/NLA/PCNA Guideline on the Management of Blood Cholesterol: A Report of the American College of Cardiology/American Heart Association Task Force on Clinical Practice Guidelines. J Am Coll Cardiol. 2019;73(24):3168-3209. Marca Ancona, MD Electronically Signed   By: Marca Ancona M.D.   On: 06/23/2023 18:23    Administration History     None           No data to display          No results found for: "NITRICOXIDE"      Assessment & Plan:   Excessive daytime sleepiness She has snoring, excessive daytime sleepiness, restless sleep. BMI 35. History of HTN, DM. Epworth 13. Given this,  I am concerned she could have sleep disordered breathing with obstructive sleep apnea. She will need sleep study for further evaluation.    - discussed how weight can impact sleep and risk for sleep disordered breathing - discussed options to assist with weight loss: combination of diet modification, cardiovascular and strength training exercises   - had an extensive discussion regarding the  adverse health consequences related to untreated sleep disordered breathing - specifically discussed the risks for hypertension, coronary artery disease, cardiac dysrhythmias, cerebrovascular disease, and diabetes - lifestyle modification discussed   - discussed how sleep disruption can increase risk of accidents, particularly when driving - safe driving practices were discussed  Patient Instructions  Given your symptoms, I am concerned that you may have sleep disordered breathing with sleep apnea. You will need a sleep study for further evaluation. Someone will contact you to schedule this.   We discussed how untreated sleep apnea puts an individual at risk for cardiac arrhthymias, pulm HTN, DM, stroke and increases their risk for daytime accidents. We also briefly reviewed treatment options including weight loss, side sleeping position, oral appliance, CPAP therapy or referral to ENT for possible surgical options  Use caution when driving and pull over if you become sleepy.  Follow up in 6 weeks with Katie Cari Vandeberg,NP to go over sleep study results, or sooner, if needed. Friday PM virtual clinic preferred       Obesity (BMI 30-39.9) BMI 35. Healthy weight loss encouraged.    Advised if symptoms do not improve or worsen, to please contact office for sooner follow up or seek emergency care.   I spent 35 minutes of dedicated to the care of this patient on the date of this encounter to include pre-visit review of records, face-to-face time with the patient discussing conditions above, post visit ordering of testing, clinical documentation with the electronic health record, making appropriate referrals as documented, and communicating necessary findings to members of the patients care team.  Noemi Chapel, NP 06/28/2023  Pt aware and understands NP's role.

## 2023-07-04 DIAGNOSIS — M65312 Trigger thumb, left thumb: Secondary | ICD-10-CM | POA: Diagnosis not present

## 2023-07-04 DIAGNOSIS — M1812 Unilateral primary osteoarthritis of first carpometacarpal joint, left hand: Secondary | ICD-10-CM | POA: Diagnosis not present

## 2023-07-04 DIAGNOSIS — M180 Bilateral primary osteoarthritis of first carpometacarpal joints: Secondary | ICD-10-CM | POA: Diagnosis not present

## 2023-07-04 DIAGNOSIS — M1811 Unilateral primary osteoarthritis of first carpometacarpal joint, right hand: Secondary | ICD-10-CM | POA: Diagnosis not present

## 2023-07-10 ENCOUNTER — Ambulatory Visit: Admitting: Adult Health

## 2023-07-10 DIAGNOSIS — M1811 Unilateral primary osteoarthritis of first carpometacarpal joint, right hand: Secondary | ICD-10-CM | POA: Diagnosis not present

## 2023-07-10 DIAGNOSIS — M1812 Unilateral primary osteoarthritis of first carpometacarpal joint, left hand: Secondary | ICD-10-CM | POA: Diagnosis not present

## 2023-07-10 DIAGNOSIS — G4719 Other hypersomnia: Secondary | ICD-10-CM

## 2023-07-10 DIAGNOSIS — M65312 Trigger thumb, left thumb: Secondary | ICD-10-CM | POA: Diagnosis not present

## 2023-07-10 DIAGNOSIS — M180 Bilateral primary osteoarthritis of first carpometacarpal joints: Secondary | ICD-10-CM | POA: Diagnosis not present

## 2023-07-10 DIAGNOSIS — G4730 Sleep apnea, unspecified: Secondary | ICD-10-CM | POA: Diagnosis not present

## 2023-07-11 DIAGNOSIS — G4730 Sleep apnea, unspecified: Secondary | ICD-10-CM

## 2023-07-11 HISTORY — DX: Sleep apnea, unspecified: G47.30

## 2023-07-15 ENCOUNTER — Emergency Department (HOSPITAL_BASED_OUTPATIENT_CLINIC_OR_DEPARTMENT_OTHER): Admission: EM | Admit: 2023-07-15 | Discharge: 2023-07-15 | Disposition: A | Discharge status: Home / Self Care

## 2023-07-15 ENCOUNTER — Encounter (HOSPITAL_BASED_OUTPATIENT_CLINIC_OR_DEPARTMENT_OTHER): Payer: Self-pay

## 2023-07-15 ENCOUNTER — Emergency Department (HOSPITAL_BASED_OUTPATIENT_CLINIC_OR_DEPARTMENT_OTHER)

## 2023-07-15 ENCOUNTER — Other Ambulatory Visit: Payer: Self-pay

## 2023-07-15 DIAGNOSIS — Z7984 Long term (current) use of oral hypoglycemic drugs: Secondary | ICD-10-CM | POA: Insufficient documentation

## 2023-07-15 DIAGNOSIS — K439 Ventral hernia without obstruction or gangrene: Secondary | ICD-10-CM | POA: Diagnosis not present

## 2023-07-15 DIAGNOSIS — D72829 Elevated white blood cell count, unspecified: Secondary | ICD-10-CM | POA: Diagnosis not present

## 2023-07-15 DIAGNOSIS — E119 Type 2 diabetes mellitus without complications: Secondary | ICD-10-CM | POA: Insufficient documentation

## 2023-07-15 DIAGNOSIS — R1032 Left lower quadrant pain: Secondary | ICD-10-CM | POA: Diagnosis present

## 2023-07-15 DIAGNOSIS — K5792 Diverticulitis of intestine, part unspecified, without perforation or abscess without bleeding: Secondary | ICD-10-CM | POA: Insufficient documentation

## 2023-07-15 DIAGNOSIS — K5730 Diverticulosis of large intestine without perforation or abscess without bleeding: Secondary | ICD-10-CM | POA: Diagnosis not present

## 2023-07-15 DIAGNOSIS — Z7982 Long term (current) use of aspirin: Secondary | ICD-10-CM | POA: Diagnosis not present

## 2023-07-15 DIAGNOSIS — I10 Essential (primary) hypertension: Secondary | ICD-10-CM | POA: Insufficient documentation

## 2023-07-15 DIAGNOSIS — K8020 Calculus of gallbladder without cholecystitis without obstruction: Secondary | ICD-10-CM | POA: Diagnosis not present

## 2023-07-15 DIAGNOSIS — Z79899 Other long term (current) drug therapy: Secondary | ICD-10-CM | POA: Insufficient documentation

## 2023-07-15 LAB — CBC
HCT: 38.6 % (ref 36.0–46.0)
Hemoglobin: 12.7 g/dL (ref 12.0–15.0)
MCH: 27.1 pg (ref 26.0–34.0)
MCHC: 32.9 g/dL (ref 30.0–36.0)
MCV: 82.3 fL (ref 80.0–100.0)
Platelets: 418 10*3/uL — ABNORMAL HIGH (ref 150–400)
RBC: 4.69 MIL/uL (ref 3.87–5.11)
RDW: 15.7 % — ABNORMAL HIGH (ref 11.5–15.5)
WBC: 16.5 10*3/uL — ABNORMAL HIGH (ref 4.0–10.5)
nRBC: 0 % (ref 0.0–0.2)

## 2023-07-15 LAB — URINALYSIS, ROUTINE W REFLEX MICROSCOPIC
Bacteria, UA: NONE SEEN
Bilirubin Urine: NEGATIVE
Glucose, UA: >1000 mg/dL — AB
Hgb urine dipstick: NEGATIVE
Ketones, ur: NEGATIVE mg/dL
Leukocytes,Ua: NEGATIVE
Nitrite: NEGATIVE
Protein, ur: NEGATIVE mg/dL
Specific Gravity, Urine: 1.021 (ref 1.005–1.030)
pH: 5 (ref 5.0–8.0)

## 2023-07-15 LAB — COMPREHENSIVE METABOLIC PANEL WITH GFR
ALT: 18 U/L (ref 0–44)
AST: 18 U/L (ref 15–41)
Albumin: 4.3 g/dL (ref 3.5–5.0)
Alkaline Phosphatase: 77 U/L (ref 38–126)
Anion gap: 10 (ref 5–15)
BUN: 23 mg/dL (ref 8–23)
CO2: 24 mmol/L (ref 22–32)
Calcium: 10 mg/dL (ref 8.9–10.3)
Chloride: 100 mmol/L (ref 98–111)
Creatinine, Ser: 0.73 mg/dL (ref 0.44–1.00)
GFR, Estimated: >60 mL/min (ref >60)
Glucose, Bld: 103 mg/dL — ABNORMAL HIGH (ref 70–99)
Potassium: 4 mmol/L (ref 3.5–5.1)
Sodium: 134 mmol/L — ABNORMAL LOW (ref 135–145)
Total Bilirubin: 0.6 mg/dL (ref 0.0–1.2)
Total Protein: 7.7 g/dL (ref 6.5–8.1)

## 2023-07-15 LAB — LIPASE, BLOOD: Lipase: 21 U/L (ref 11–51)

## 2023-07-15 MED ORDER — ONDANSETRON HCL 4 MG/2ML IJ SOLN
4.0000 mg | Freq: Once | INTRAMUSCULAR | Status: AC
  Administered 2023-07-15: 4 mg via INTRAVENOUS
  Filled 2023-07-15: qty 2

## 2023-07-15 MED ORDER — SODIUM CHLORIDE 0.9 % IV BOLUS
1000.0000 mL | Freq: Once | INTRAVENOUS | Status: AC
  Administered 2023-07-15: 1000 mL via INTRAVENOUS

## 2023-07-15 MED ORDER — AMOXICILLIN-POT CLAVULANATE 875-125 MG PO TABS: 1.0000 | ORAL_TABLET | Freq: Two times a day (BID) | ORAL | 0 refills | Status: DC

## 2023-07-15 MED ORDER — MORPHINE SULFATE (PF) 4 MG/ML IV SOLN
4.0000 mg | Freq: Once | INTRAVENOUS | Status: AC
  Administered 2023-07-15: 4 mg via INTRAVENOUS
  Filled 2023-07-15: qty 1

## 2023-07-15 MED ORDER — AMOXICILLIN-POT CLAVULANATE 875-125 MG PO TABS
1.0000 | ORAL_TABLET | Freq: Once | ORAL | Status: AC
  Administered 2023-07-15: 1 via ORAL
  Filled 2023-07-15: qty 1

## 2023-07-15 MED ORDER — HYDROCODONE-ACETAMINOPHEN 5-325 MG PO TABS: 1.0000 | ORAL_TABLET | Freq: Four times a day (QID) | ORAL | 0 refills | Status: DC | PRN

## 2023-07-15 NOTE — ED Provider Notes (Signed)
 [place] EMERGENCY DEPARTMENT AT [facility] Provider Note   CSN: [id number] Arrival date & time: 07/15/23  1004     History  Chief Complaint  Patient presents with   Abdominal Pain   HPI Carol [name] is a 68 y.o. female with history of diverticulitis status post bowel resection 2015, hypertension, type 2 diabetes presenting for abdominal pain.  Started yesterday around 1 PM just after lunch.  The pain is in the left lower quadrant of the abdomen.  Endorses some nausea but no vomiting or diarrhea.  Also states she has not had a bowel movement since 2 days ago and is not passing gas.  She reports a "bowel blockage" 10 years ago as well.  Denies any urinary symptoms.   Abdominal Pain      Home Medications Prior to Admission medications   Medication Sig Start Date End Date Taking? Authorizing Provider  amoxicillin-clavulanate (AUGMENTIN) 875-125 MG tablet Take 1 tablet by mouth every 12 (twelve) hours. 07/15/23  Yes Gareth Eagle, PA-C  HYDROcodone-acetaminophen (NORCO/VICODIN) 5-325 MG tablet Take 1 tablet by mouth every 6 (six) hours as needed. 07/15/23  Yes Gareth Eagle, PA-C  Ascorbic Acid (VITAMIN C) 1000 MG tablet Take 1,000 mg by mouth daily.    [provider]  aspirin 81 MG tablet Take 81 mg by mouth at bedtime.     [provider]  candesartan-hydrochlorothiazide (ATACAND HCT) 16-12.5 MG tablet Take 1 tablet by mouth daily. 05/11/23   Kuneff, Renee A, DO  cetirizine (ZYRTEC) 10 MG tablet Take 10 mg by mouth at bedtime.     [provider]  citalopram (CELEXA) 20 MG tablet Take 1 tablet (20 mg total) by mouth daily. 05/11/23   Kuneff, Renee A, DO  dicyclomine (BENTYL) 10 MG capsule Take 1 capsule (10 mg total) by mouth 4 (four) times daily -  before meals and at bedtime. Patient not taking: Reported on 05/10/2023 09/02/22   Felix Pacini A, DO  empagliflozin (JARDIANCE) 25 MG TABS tablet Take 1 tablet (25 mg total) by mouth daily. 05/11/23    Kuneff, Renee A, DO  FERREX 150 150 MG capsule Take 1 capsule (150 mg total) by mouth daily. 05/11/23   Kuneff, Renee A, DO  fluocinonide ointment (LIDEX) 0.05 % Apply 1 Application topically 2 (two) times daily. 11/05/21   Kuneff, Renee A, DO  fluticasone (FLONASE) 50 MCG/ACT nasal spray Place 2 sprays into both nostrils daily. 05/11/23   Kuneff, Renee A, DO  Lancets (ONETOUCH DELICA PLUS LANCET33G) MISC check blood sugar TWICE DAILY 05/29/23   Kuneff, Renee A, DO  linagliptin (TRADJENTA) 5 MG TABS tablet Take 1 tablet (5 mg total) by mouth daily 05/11/23   Kuneff, Renee A, DO  metFORMIN (GLUCOPHAGE-XR) 750 MG 24 hr tablet Take 1 tablet (750 mg total) by mouth daily with breakfast. 05/11/23   Kuneff, Renee A, DO  Meth-Hyo-M Bl-Na Phos-Ph Sal (URIBEL) 118 MG CAPS Take 1 capsule (118 mg total) by mouth in the morning, at noon, in the evening, and at bedtime. Take as needed for urinary pain and frequency. Patient not taking: Reported on 05/10/2023 02/13/23   Guy Sandifer L, PA  metoprolol succinate (TOPROL-XL) 25 MG 24 hr tablet Take 1 tablet (25 mg total) by mouth daily. Take with or immediately following a meal. Needs OV for refills 05/11/23   Kuneff, Renee A, DO  omeprazole (PRILOSEC) 20 MG capsule Take 1 capsule (20 mg total) by mouth daily. 05/11/23   Kuneff,  Renee A, DO  ONETOUCH ULTRA TEST test strip test sugars TWICE DAILY as directed 05/29/23   Kuneff, Renee A, DO  rosuvastatin (CRESTOR) 40 MG tablet Take 1 tablet (40 mg total) by mouth at bedtime. 05/11/23   Kuneff, Renee A, DO  timolol (TIMOPTIC) 0.25 % ophthalmic solution Place 1 drop into the left eye 2 (two) times daily. 08/21/20   [provider]  Travoprost, BAK Free, (TRAVATAN) 0.004 % SOLN ophthalmic solution Place 1 drop into both eyes at bedtime.    [provider]      Allergies    Ivp dye [iodinated contrast media], Latanoprost, Lisinopril, Onion, and Sulfa antibiotics    Review of Systems   Review of Systems   Gastrointestinal:  Positive for abdominal pain.    Physical Exam Updated Vital Signs BP 135/66 (BP Location: Right Arm)   Pulse 64   Temp 98.6 F (37 C) (Oral)   Resp 18   Ht 5\' 3"  (1.6 m)   Wt 90.7 kg   SpO2 96%   BMI 35.43 kg/m  Physical Exam Vitals and nursing note reviewed.  HENT:     Head: Normocephalic and atraumatic.     Mouth/Throat:     Mouth: Mucous membranes are moist.  Eyes:     General:        Right eye: No discharge.        Left eye: No discharge.     Conjunctiva/sclera: Conjunctivae normal.  Cardiovascular:     Rate and Rhythm: Normal rate and regular rhythm.     Pulses: Normal pulses.     Heart sounds: Normal heart sounds.  Pulmonary:     Effort: Pulmonary effort is normal.     Breath sounds: Normal breath sounds.  Abdominal:     General: Abdomen is flat.     Palpations: Abdomen is soft.     Tenderness: There is abdominal tenderness in the left lower quadrant. There is left CVA tenderness.  Skin:    General: Skin is warm and dry.  Neurological:     General: No focal deficit present.  Psychiatric:        Mood and Affect: Mood normal.     ED Results / Procedures / Treatments   Labs (all labs ordered are listed, but only abnormal results are displayed) Labs Reviewed  COMPREHENSIVE METABOLIC PANEL WITH GFR - Abnormal; Notable for the following components:      Result Value   Sodium 134 (*)    Glucose, Bld 103 (*)    All other components within normal limits  CBC - Abnormal; Notable for the following components:   WBC 16.5 (*)    RDW 15.7 (*)    Platelets 418 (*)    All other components within normal limits  URINALYSIS, ROUTINE W REFLEX MICROSCOPIC - Abnormal; Notable for the following components:   Glucose, UA >1,000 (*)    All other components within normal limits  LIPASE, BLOOD    EKG None  Radiology CT ABDOMEN PELVIS WO CONTRAST Result Date: 07/15/2023 CLINICAL DATA:  Acute abdominal pain EXAM: CT ABDOMEN AND PELVIS WITHOUT  CONTRAST TECHNIQUE: Multidetector CT imaging of the abdomen and pelvis was performed following the standard protocol without IV contrast. RADIATION DOSE REDUCTION: This exam was performed according to the departmental dose-optimization program which includes automated exposure control, adjustment of the mA and/or kV according to patient size and/or use of iterative reconstruction technique. COMPARISON:  CT with contrast 03/22/2022 FINDINGS: Lower chest: Mild linear opacity  lung bases likely scar or atelectasis. No pleural effusion. Coronary artery calcifications are seen. Hepatobiliary: On this non IV contrast exam, grossly preserved hepatic parenchyma. Stones are seen in the nondilated gallbladder dependently. Pancreas: Unremarkable. No pancreatic ductal dilatation or surrounding inflammatory changes. Spleen: Normal in size without focal abnormality. Adrenals/Urinary Tract: Adrenal glands are unremarkable. Kidneys are normal, without renal calculi, focal lesion, or hydronephrosis. Bladder is unremarkable. Stomach/Bowel: Large bowel has a normal course and caliber with scattered colonic stool. There is wall thickening along the sigmoid colon with stranding and diverticula. No complicating features of diverticulitis such as abscess formation, free air or obstruction. The appendix is not well seen in the right lower quadrant but there are some surgical changes. Please correlate with clinical history. The stomach and small bowel are nondilated. Vascular/Lymphatic: Scattered vascular calcifications. Normal caliber aorta and IVC. No specific abnormal lymph node enlargement identified in the abdomen and pelvis. Reproductive: Status post hysterectomy. No adnexal masses. Other: No free air or free fluid. Small midline anterior abdominal wall fat containing hernias near the umbilicus. Musculoskeletal: Curvature and degenerative changes along the spine. IMPRESSION: Focal area of wall thickening and stranding along the sigmoid  colon with diverticula consistent with an area of diverticulitis. No complex features at this time. Recommend follow up after treatment to confirm resolution and exclude secondary pathology. Gallstones. Electronically Signed   By: Karen Kays M.D.   On: 07/15/2023 11:40    Procedures Procedures    Medications Ordered in ED Medications  amoxicillin-clavulanate (AUGMENTIN) 875-125 MG per tablet 1 tablet (has no administration in time range)  ondansetron (ZOFRAN) injection 4 mg (4 mg Intravenous Given 07/15/23 1119)  morphine (PF) 4 MG/ML injection 4 mg (4 mg Intravenous Given 07/15/23 1119)  sodium chloride 0.9 % bolus 1,000 mL (1,000 mLs Intravenous New Bag/Given 07/15/23 1117)    ED Course/ Medical Decision Making/ A&P Clinical Course as of 07/15/23 1156  Sat Jul 15, 2023  1151 CT ABDOMEN PELVIS WO CONTRAST [JR]    Clinical Course User Index [JR] Gareth Eagle, PA-C                                 Medical Decision Making Amount and/or Complexity of Data Reviewed Labs: ordered. Radiology: ordered.  Risk Prescription drug management.   Initial Impression and Ddx 68 year old well-appearing female presenting for left lower quadrant abdominal pain.  Exam was notable for left lower quadrant tenderness.  DDx includes diverticulitis, kidney stone, pyelonephritis, bowel obstruction, bowel perforation, other. Patient PMH that increases complexity of ED encounter:  history of diverticulitis status post bowel resection 2015, hypertension, type 2 diabetes   Interpretation of Diagnostics - I independent reviewed and interpreted the labs as followed: Leukocytosis  - I independently visualized the following imaging with scope of interpretation limited to determining acute life threatening conditions related to emergency care: CT ab/pelvis, which revealed findings consistent with diverticulitis near the sigmoid colon and gallstones.  Advised patient of these findings.  Patient Reassessment  and Ultimate Disposition/Management On reassessment, pain is well-controlled, she appears well, no acute distress and hemodynamically stable.  Workup suggestive of uncomplicated diverticulitis.  Started on Augmentin.  Will send a few tablets of Norco to her pharmacy as well for acute pain.  Given her history of diverticulitis and resection, felt she needed to follow-up with GI.  Discussed strict return precautions should her symptoms worsen.  Fluid challenge with no issue.  Discharged in  good condition.  Patient management required discussion with the following services or consulting groups:  None  Complexity of Problems Addressed Acute complicated illness or Injury  Additional Data Reviewed and Analyzed Further history obtained from: Further history from spouse/family member, Past medical history and medications listed in the EMR, and Prior ED visit notes  Patient Encounter Risk Assessment Prescriptions         Final Clinical Impression(s) / ED Diagnoses Final diagnoses:  Diverticulitis    Rx / DC Orders ED Discharge Orders          Ordered    amoxicillin-clavulanate (AUGMENTIN) 875-125 MG tablet  Every 12 hours        07/15/23 1147    HYDROcodone-acetaminophen (NORCO/VICODIN) 5-325 MG tablet  Every 6 hours PRN        07/15/23 1155              Gareth Eagle, PA-C 07/15/23 1156    Durwin Glaze, MD 07/15/23 [phone]

## 2023-07-15 NOTE — ED Notes (Signed)
 Patient discharged to home after review of written discharge instructions.  Rx for augmentin and hydrocodone reviewed and patient discharged alert, oriented and self-ambulatory, accompanied by daughter.  To follow up with PCP, return as needed.

## 2023-07-15 NOTE — ED Triage Notes (Signed)
 Pt reports LLQ abd pain since 1300 PM yesterday. Pt reports hx of diverticulitis and resection. Pt has been constipated in x2 days. Pt endorses nausea, but denies any vomiting. Pt reports difficulty in passing gas. Pt reports hx of bowel blockage x10 years ago.

## 2023-07-15 NOTE — Discharge Instructions (Addendum)
 Evaluation today revealed that you do have diverticulitis.  This is likely causing her symptoms.  At this time it appears to be "uncomplicated".  For this reason I feel it is appropriate to start you on Augmentin which is an antibiotic to treat the infection and for you to follow-up with GI in their clinic.  If you develop worsening abdominal pain, bloody stools, high fever, persistent nausea vomiting, inability tolerate fluid intake or any other concerning symptom please return to the ED for further evaluation.  I also sent a few tablets of Norco for acute pain.  Also recommend ibuprofen and Tylenol.

## 2023-07-17 ENCOUNTER — Emergency Department (HOSPITAL_COMMUNITY)
Admission: EM | Admit: 2023-07-17 | Discharge: 2023-07-17 | Disposition: A | Discharge status: Home / Self Care | Attending: Emergency Medicine | Admitting: Emergency Medicine

## 2023-07-17 ENCOUNTER — Ambulatory Visit: Payer: Self-pay

## 2023-07-17 ENCOUNTER — Other Ambulatory Visit: Payer: Self-pay

## 2023-07-17 ENCOUNTER — Emergency Department (HOSPITAL_COMMUNITY)

## 2023-07-17 ENCOUNTER — Encounter (HOSPITAL_COMMUNITY): Payer: Self-pay

## 2023-07-17 DIAGNOSIS — K429 Umbilical hernia without obstruction or gangrene: Secondary | ICD-10-CM | POA: Diagnosis not present

## 2023-07-17 DIAGNOSIS — R1032 Left lower quadrant pain: Secondary | ICD-10-CM | POA: Diagnosis not present

## 2023-07-17 DIAGNOSIS — K8020 Calculus of gallbladder without cholecystitis without obstruction: Secondary | ICD-10-CM | POA: Diagnosis not present

## 2023-07-17 DIAGNOSIS — I10 Essential (primary) hypertension: Secondary | ICD-10-CM | POA: Insufficient documentation

## 2023-07-17 DIAGNOSIS — R109 Unspecified abdominal pain: Secondary | ICD-10-CM | POA: Diagnosis present

## 2023-07-17 DIAGNOSIS — Z7984 Long term (current) use of oral hypoglycemic drugs: Secondary | ICD-10-CM | POA: Diagnosis not present

## 2023-07-17 DIAGNOSIS — D72829 Elevated white blood cell count, unspecified: Secondary | ICD-10-CM | POA: Diagnosis not present

## 2023-07-17 DIAGNOSIS — Z79899 Other long term (current) drug therapy: Secondary | ICD-10-CM | POA: Insufficient documentation

## 2023-07-17 DIAGNOSIS — K5730 Diverticulosis of large intestine without perforation or abscess without bleeding: Secondary | ICD-10-CM | POA: Diagnosis not present

## 2023-07-17 DIAGNOSIS — E119 Type 2 diabetes mellitus without complications: Secondary | ICD-10-CM | POA: Diagnosis not present

## 2023-07-17 DIAGNOSIS — K5792 Diverticulitis of intestine, part unspecified, without perforation or abscess without bleeding: Secondary | ICD-10-CM | POA: Diagnosis not present

## 2023-07-17 DIAGNOSIS — Z7982 Long term (current) use of aspirin: Secondary | ICD-10-CM | POA: Insufficient documentation

## 2023-07-17 LAB — CBC
HCT: 40.5 % (ref 36.0–46.0)
Hemoglobin: 12.8 g/dL (ref 12.0–15.0)
MCH: 26.9 pg (ref 26.0–34.0)
MCHC: 31.6 g/dL (ref 30.0–36.0)
MCV: 85.1 fL (ref 80.0–100.0)
Platelets: 435 10*3/uL — ABNORMAL HIGH (ref 150–400)
RBC: 4.76 MIL/uL (ref 3.87–5.11)
RDW: 15.7 % — ABNORMAL HIGH (ref 11.5–15.5)
WBC: 16.7 10*3/uL — ABNORMAL HIGH (ref 4.0–10.5)
nRBC: 0 % (ref 0.0–0.2)

## 2023-07-17 LAB — COMPREHENSIVE METABOLIC PANEL WITH GFR
ALT: 20 U/L (ref 0–44)
AST: 18 U/L (ref 15–41)
Albumin: 3.9 g/dL (ref 3.5–5.0)
Alkaline Phosphatase: 70 U/L (ref 38–126)
Anion gap: 12 (ref 5–15)
BUN: 21 mg/dL (ref 8–23)
CO2: 24 mmol/L (ref 22–32)
Calcium: 10.2 mg/dL (ref 8.9–10.3)
Chloride: 99 mmol/L (ref 98–111)
Creatinine, Ser: 0.8 mg/dL (ref 0.44–1.00)
GFR, Estimated: >60 mL/min (ref >60)
Glucose, Bld: 99 mg/dL (ref 70–99)
Potassium: 3.9 mmol/L (ref 3.5–5.1)
Sodium: 135 mmol/L (ref 135–145)
Total Bilirubin: 0.6 mg/dL (ref 0.0–1.2)
Total Protein: 8.7 g/dL — ABNORMAL HIGH (ref 6.5–8.1)

## 2023-07-17 LAB — URINALYSIS, ROUTINE W REFLEX MICROSCOPIC
Bacteria, UA: NONE SEEN
Bilirubin Urine: NEGATIVE
Glucose, UA: >=500 mg/dL — AB
Hgb urine dipstick: NEGATIVE
Ketones, ur: NEGATIVE mg/dL
Leukocytes,Ua: NEGATIVE
Nitrite: NEGATIVE
Protein, ur: NEGATIVE mg/dL
Specific Gravity, Urine: 1.017 (ref 1.005–1.030)
pH: 5 (ref 5.0–8.0)

## 2023-07-17 LAB — LIPASE, BLOOD: Lipase: 24 U/L (ref 11–51)

## 2023-07-17 MED ORDER — MORPHINE SULFATE (PF) 4 MG/ML IV SOLN
4.0000 mg | Freq: Once | INTRAVENOUS | Status: AC
  Administered 2023-07-17: 4 mg via INTRAVENOUS
  Filled 2023-07-17: qty 1

## 2023-07-17 MED ORDER — ONDANSETRON HCL 4 MG/2ML IJ SOLN
4.0000 mg | Freq: Once | INTRAMUSCULAR | Status: AC
Start: 2023-07-17 — End: 2023-07-17
  Administered 2023-07-17: 4 mg via INTRAVENOUS
  Filled 2023-07-17: qty 2

## 2023-07-17 NOTE — Telephone Encounter (Signed)
 Chief Complaint: Diverticulitis  Symptoms: Severe abdominal pain, fever, diarrhea Frequency: Since Friday Pertinent Negatives: Patient denies vomiting Disposition: [x] ED /[] Urgent Care (no appt availability in office) / [] Appointment(In office/virtual)/ []  [place] [facility]/ [] Home Care/ [] Refused Recommended Disposition /[] [place] [facility]/ []  Follow-up with PCP Additional Notes: Patient called in to report that she was seen in the ED on Saturday for diverticulitis flare-up. Patient stated that she is still experiencing severe abdominal pain on and off. Patient was started on antibiotics upon discharge on Saturday. Patient stated she had a fever of 100.2 this morning. Patient is unsure if she was running a fever in the ED over the weekend. Patient expressed she is nervous because she had to have bowel surgery in the past. Advised ED at this time, per protocol. Patient complied and stated her sister would take her. Advised patient to call back if symptoms worsen/anything changes. Patient complied.  Reason for Disposition  [1] SEVERE pain AND [2] age > 60 years  Answer Assessment - Initial Assessment Questions 1. LOCATION: "Where does it hurt?"      LLQ 2. RADIATION: "Does the pain shoot anywhere else?" (e.g., chest, back)     Radiating to back 3. ONSET: "When did the pain begin?" (e.g., minutes, hours or days ago)      Started Friday, went to ED on Saturday, discharged on Saturday 4. SUDDEN: "Gradual or sudden onset?"     Sudden 5. PATTERN "Does the pain come and go, or is it constant?"    - If it comes and goes: "How long does it last?" "Do you have pain now?"     (Note: Comes and goes means the pain is intermittent. It goes away completely between bouts.)    - If constant: "Is it getting better, staying the same, or getting worse?"      (Note: Constant means the pain never goes away completely; most serious pain is constant and gets worse.)      Sharp pain, intensity comes and  goes 6. SEVERITY: "How bad is the pain?"  (e.g., Scale 1-10; mild, moderate, or severe)    - MILD (1-3): Doesn't interfere with normal activities, abdomen soft and not tender to touch.     - MODERATE (4-7): Interferes with normal activities or awakens from sleep, abdomen tender to touch.     - SEVERE (8-10): Excruciating pain, doubled over, unable to do any normal activities.       Rates pain a 4-5 at this time, states pain was severe this morning- enough to make her bend over 7. RECURRENT SYMPTOM: "Have you ever had this type of stomach pain before?" If Yes, ask: "When was the last time?" and "What happened that time?"      Yes, history of diverticulitis  8. CAUSE: "What do you think is causing the stomach pain?"     Diverticulitis  9. RELIEVING/AGGRAVATING FACTORS: "What makes it better or worse?" (e.g., antacids, bending or twisting motion, bowel movement)     Rest and laying in recliner 10. OTHER SYMPTOMS: "Do you have any other symptoms?" (e.g., back pain, diarrhea, fever, urination pain, vomiting)     Fever of 100.2 this morning, diarrhea this morning, denies vomiting  Protocols used: Abdominal Pain - Female-A-AH

## 2023-07-17 NOTE — ED Provider Notes (Signed)
 [place] EMERGENCY DEPARTMENT AT [facility] Provider Note   CSN: [id number] Arrival date & time: 07/17/23  1204     History  Chief Complaint  Patient presents with   Abdominal Pain    Carol [name] is a 68 y.o. female past medical history significant for GERD, hypertension, obesity, diabetes, who was recently diagnosed on Friday with diverticulitis who returns to the emergency department with ongoing abdominal pain that she reports is not better.  She has been sticking mostly to a liquid diet but reports that she has had an egg sandwich the last 2 mornings.  She denies any vomiting.  She does endorse some diarrhea.  She reports she is taking the antibiotic as prescribed.  She rates her pain 5/10.  She reports that she was sent home with pain medication but she has not been taking it.   Abdominal Pain      Home Medications Prior to Admission medications   Medication Sig Start Date End Date Taking? Authorizing Provider  amoxicillin-clavulanate (AUGMENTIN) 875-125 MG tablet Take 1 tablet by mouth every 12 (twelve) hours. 07/15/23   Gareth Eagle, PA-C  Ascorbic Acid (VITAMIN C) 1000 MG tablet Take 1,000 mg by mouth daily.    [provider]  aspirin 81 MG tablet Take 81 mg by mouth at bedtime.     [provider]  candesartan-hydrochlorothiazide (ATACAND HCT) 16-12.5 MG tablet Take 1 tablet by mouth daily. 05/11/23   Kuneff, Renee A, DO  cetirizine (ZYRTEC) 10 MG tablet Take 10 mg by mouth at bedtime.     [provider]  citalopram (CELEXA) 20 MG tablet Take 1 tablet (20 mg total) by mouth daily. 05/11/23   Kuneff, Renee A, DO  empagliflozin (JARDIANCE) 25 MG TABS tablet Take 1 tablet (25 mg total) by mouth daily. 05/11/23   Kuneff, Renee A, DO  FERREX 150 150 MG capsule Take 1 capsule (150 mg total) by mouth daily. 05/11/23   Kuneff, Renee A, DO  fluocinonide ointment (LIDEX) 0.05 % Apply 1 Application topically 2 (two) times daily. 11/05/21    Kuneff, Renee A, DO  fluticasone (FLONASE) 50 MCG/ACT nasal spray Place 2 sprays into both nostrils daily. 05/11/23   Kuneff, Renee A, DO  HYDROcodone-acetaminophen (NORCO/VICODIN) 5-325 MG tablet Take 1 tablet by mouth every 6 (six) hours as needed. 07/15/23   Gareth Eagle, PA-C  linagliptin (TRADJENTA) 5 MG TABS tablet Take 1 tablet (5 mg total) by mouth daily 05/11/23   Kuneff, Renee A, DO  metFORMIN (GLUCOPHAGE-XR) 750 MG 24 hr tablet Take 1 tablet (750 mg total) by mouth daily with breakfast. 05/11/23   Kuneff, Renee A, DO  metoprolol succinate (TOPROL-XL) 25 MG 24 hr tablet Take 1 tablet (25 mg total) by mouth daily. Take with or immediately following a meal. Needs OV for refills 05/11/23   Kuneff, Renee A, DO  omeprazole (PRILOSEC) 20 MG capsule Take 1 capsule (20 mg total) by mouth daily. 05/11/23   Kuneff, Renee A, DO  rosuvastatin (CRESTOR) 40 MG tablet Take 1 tablet (40 mg total) by mouth at bedtime. 05/11/23   Kuneff, Renee A, DO  timolol (TIMOPTIC) 0.25 % ophthalmic solution Place 1 drop into the left eye 2 (two) times daily. 08/21/20   [provider]  Travoprost, BAK Free, (TRAVATAN) 0.004 % SOLN ophthalmic solution Place 1 drop into both eyes at bedtime.    [provider]      Allergies    Ivp dye [iodinated  contrast media], Latanoprost, Lisinopril, Onion, and Sulfa antibiotics    Review of Systems   Review of Systems  Gastrointestinal:  Positive for abdominal pain.  All other systems reviewed and are negative.   Physical Exam Updated Vital Signs BP (!) 123/96   Pulse 80   Temp 98.2 F (36.8 C) (Oral)   Resp 17   Ht 5\' 3"  (1.6 m)   Wt 90.7 kg   SpO2 96%   BMI 35.43 kg/m  Physical Exam Vitals and nursing note reviewed.  Constitutional:      General: She is not in acute distress.    Appearance: Normal appearance.  HENT:     Head: Normocephalic and atraumatic.  Eyes:     General:        Right eye: No discharge.        Left eye: No discharge.   Cardiovascular:     Rate and Rhythm: Normal rate and regular rhythm.     Heart sounds: No murmur heard.    No friction rub. No gallop.  Pulmonary:     Effort: Pulmonary effort is normal.     Breath sounds: Normal breath sounds.  Abdominal:     General: Bowel sounds are normal.     Palpations: Abdomen is soft.     Comments: Moderate ttp in left lower quadrant, no rebound, rigidity, guarding  Skin:    General: Skin is warm and dry.     Capillary Refill: Capillary refill takes less than 2 seconds.  Neurological:     Mental Status: She is alert and oriented to person, place, and time.  Psychiatric:        Mood and Affect: Mood normal.        Behavior: Behavior normal.     ED Results / Procedures / Treatments   Labs (all labs ordered are listed, but only abnormal results are displayed) Labs Reviewed  COMPREHENSIVE METABOLIC PANEL WITH GFR - Abnormal; Notable for the following components:      Result Value   Total Protein 8.7 (*)    All other components within normal limits  CBC - Abnormal; Notable for the following components:   WBC 16.7 (*)    RDW 15.7 (*)    Platelets 435 (*)    All other components within normal limits  URINALYSIS, ROUTINE W REFLEX MICROSCOPIC - Abnormal; Notable for the following components:   Glucose, UA >=500 (*)    All other components within normal limits  LIPASE, BLOOD    EKG None  Radiology No results found.  Procedures Procedures    Medications Ordered in ED Medications  morphine (PF) 4 MG/ML injection 4 mg (4 mg Intravenous Given 07/17/23 1445)    ED Course/ Medical Decision Making/ A&P                                 Medical Decision Making Amount and/or Complexity of Data Reviewed Labs: ordered. Radiology: ordered.  Risk Prescription drug management.   This patient is a 68 y.o. female  who presents to the ED for concern of abdominal pain, diarrhea.   Differential diagnoses prior to evaluation: The emergent differential  diagnosis includes, but is not limited to,  The causes of generalized abdominal pain include but are not limited to AAA, mesenteric ischemia, appendicitis, diverticulitis, DKA, gastritis, gastroenteritis, AMI, nephrolithiasis, pancreatitis, peritonitis, adrenal insufficiency,lead poisoning, iron toxicity, intestinal ischemia, constipation, UTI,SBO/LBO, splenic rupture, biliary disease, IBD, IBS,  PUD, or hepatitis . This is not an exhaustive differential.   Past Medical History / Co-morbidities / Social History: GERD, hypertension, obesity, diabetes, who was recently diagnosed on Friday with diverticulitis  Additional history: Chart reviewed. Pertinent results include: Extensively reviewed lab work, imaging from her recent previous emergency room visit, CT scan showing uncomplicated diverticulitis without perforation, abscess.  Physical Exam: Physical exam performed. The pertinent findings include: Patient with some tenderness to palpation in the left lower quadrant, no rebound, rigidity, guarding, normal bowel sounds throughout.  Vital signs stable with no fever, slight diastolic hypertension, blood pressure 123/96 on arrival.  Lab Tests/Imaging studies: I personally interpreted labs/imaging and the pertinent results include: CBC notable for leukocytosis, white blood cell 16.7, very mildly increased from ED on Friday where it was at 16.5, mild elevation of platelets likely acute phase reactant, no clinically significant anemia, UA overall unremarkable, normal lipase, CMP notable for no significant liver enzyme abnormalities or electrolyte abnormalities.  Total protein is mildly elevated 8.7.  CT abdomen pelvis without contrast obtained, pending at time of handoff.  I agree with the radiologist interpretation.  Medications: I ordered medication including morphine for pain.  I have reviewed the patients home medicines and have made adjustments as needed.   3:04 PM Care of Carol [name]  transferred  to [facility] and Dr. Rubin Payor at the end of my shift as the patient will require reassessment once labs/imaging have resulted. Patient presentation, ED course, and plan of care discussed with review of all pertinent labs and imaging. Please see his/her note for further details regarding further ED course and disposition. Plan at time of handoff is reassess after CT of the abdomen, if no evidence of developing abscess, perforation or other complication continue treatment for uncomplicated diverticulitis, encourage patient to use her home pain medication as needed.. This may be altered or completely changed at the discretion of the oncoming team pending results of further workup.  Final Clinical Impression(s) / ED Diagnoses Final diagnoses:  None    Rx / DC Orders ED Discharge Orders     None         Olene Floss, PA-C 07/17/23 1504    Virgina Norfolk, DO 07/18/23 0701

## 2023-07-17 NOTE — ED Triage Notes (Signed)
 Patient began having left lower abdominal pain since Friday. Went to ER Saturday was told her diverticula was infected and inflamed. Began antibiotic. Stated she is not getting any better. No vomiting. Has had diarrhea.

## 2023-07-17 NOTE — Discharge Instructions (Signed)
 You were evaluated in the emergency room for abdominal pain.  Your lab work and imaging did not show any significant worsening of your diverticulitis.  Perforation or abscess.  Please continue Augmentin and liquid diet.  Please follow-up with your primary care doctor later this week.  As discussed if you experience any new or worsening symptoms including fevers, chills, persistent vomiting, worsening pain please return to the emergency room.

## 2023-07-17 NOTE — Telephone Encounter (Signed)
 Patient called, left VM to return the call to the office to speak to NT.     Copied From CRM [id number]. Reason for Triage: Patient is calling in because she went to the ER over the weekend and she is still having issues and she doesn't think the medication is working. She has a low grade fever

## 2023-07-17 NOTE — ED Provider Notes (Signed)
 Signout from [employer], PA-C at shift change. Briefly, patient presents for abdominal pain.  Patient diagnosed with uncomplicated diverticulitis 2 days ago on 4/5.  Reports noncompliance with liquid diet.  Has been taking p.o. antibiotics as prescribed.  White count roughly stable from 16.52 days ago to 16.7 today.  Lipase within normal limits, CMP without significant abnormality, UA with greater than 500 glucose, negative nitrates, negative leukocytes she remains afebrile.  Has a history of bowel perforation.  Therefore CT has been repeated today.      3:04 PM Reassessment performed. Patient appears comfortable.  She has very mild left lower quadrant tenderness. Endorses some nausea without vomiting or diarrhea  CT abdomen pelvis with uncomplicated acute diverticulitis without perforation or abscess Discussed these findings with patient as well as discharge plan with close PCP follow-up.  She is agreeable.  Will continue Augmentin.  Reassured her that labs and imaging do not show any significant worsening diverticulitis.  She is well aware that should symptoms check including worsening with fevers, chills, worsening vomiting and pain she will return to the emergency room.    Most current vital signs reviewed and are as follows: BP (!) 123/96   Pulse 80   Temp 98.2 F (36.8 C) (Oral)   Resp 17   Ht 5\' 3"  (1.6 m)   Wt 90.7 kg   SpO2 96%   BMI 35.43 kg/m   The patient has been appropriately medically screened and/or stabilized in the ED. I have low suspicion for any other emergent medical condition which would require further screening, evaluation or treatment in the ED or require inpatient management. At time of discharge the patient is hemodynamically stable and in no acute distress. I have discussed work-up results and diagnosis with patient and answered all questions. Patient is agreeable with discharge plan. We discussed strict return precautions for returning to the emergency  department and they verbalized understanding.      Halford Decamp, PA-C 07/17/23 1818    Benjiman Core, MD 07/17/23 2328

## 2023-07-21 ENCOUNTER — Inpatient Hospital Stay: Admitting: Family Medicine

## 2023-07-22 ENCOUNTER — Ambulatory Visit
Admission: EM | Admit: 2023-07-22 | Discharge: 2023-07-22 | Disposition: A | Discharge status: Home / Self Care | Attending: Family Medicine | Admitting: Family Medicine

## 2023-07-22 DIAGNOSIS — R1032 Left lower quadrant pain: Secondary | ICD-10-CM | POA: Diagnosis not present

## 2023-07-22 DIAGNOSIS — R350 Frequency of micturition: Secondary | ICD-10-CM | POA: Insufficient documentation

## 2023-07-22 DIAGNOSIS — K5732 Diverticulitis of large intestine without perforation or abscess without bleeding: Secondary | ICD-10-CM | POA: Insufficient documentation

## 2023-07-22 LAB — POCT URINALYSIS DIP (MANUAL ENTRY)
Bilirubin, UA: NEGATIVE
Blood, UA: NEGATIVE
Glucose, UA: >=1000 mg/dL — AB
Ketones, POC UA: NEGATIVE mg/dL
Nitrite, UA: NEGATIVE
Protein Ur, POC: NEGATIVE mg/dL
Spec Grav, UA: 1.015 (ref 1.010–1.025)
Urobilinogen, UA: 0.2 U/dL
pH, UA: 5 (ref 5.0–8.0)

## 2023-07-22 MED ORDER — CIPROFLOXACIN HCL 500 MG PO TABS: 500.0000 mg | ORAL_TABLET | Freq: Two times a day (BID) | ORAL | 0 refills | Status: DC

## 2023-07-22 MED ORDER — HYDROCORTISONE 1 % EX CREA: TOPICAL_CREAM | CUTANEOUS | 0 refills | Status: DC

## 2023-07-22 MED ORDER — FLUCONAZOLE 150 MG PO TABS: 150.0000 mg | ORAL_TABLET | Freq: Every day | ORAL | 0 refills | Status: DC

## 2023-07-22 NOTE — ED Provider Notes (Signed)
 [name]    CSN: [id number] Arrival date & time: 07/22/23  8413      History   Chief Complaint Chief Complaint  Patient presents with   Abdominal Pain    Lower abdominal pressure and urinary frequency x2 days    HPI [name] is a 68 y.o. female.   HPI  Patient has known diverticular disease.  She was in the emergency room 07/15/2023 for diverticulitis.  CT scan was performed.  White blood count was elevated.  She was treated with Augmentin.  She went back on 07/17/2023 because she continued to have abdominal pain and did not feel improved.  Repeat CT scan was performed.  It was unchanged.  Repeat CBC was performed.  Her white count was slightly higher, 16.5 to 16.7. She completed the 7 days of Augmentin.  She finished this yesterday.  Since yesterday she has had urinary frequency.  Urinary discomfort.  Vaginal itching and pain.  And continues to have left lower quadrant abdominal pain.  No fever or chills.  No nausea or vomiting.  Diarrhea is improving.  Past Medical History:  Diagnosis Date   Angio-edema    Blood type O+    Chest discomfort    , Atypical   Chicken pox    COVID-19 05/09/2019   Diabetic acidosis, type II (HCC)    Diverticulitis - large vs small bowel vs both?    Gastroesophageal reflux disease    Glaucoma    Hyperlipidemia    Hypertension    PVC (premature ventricular contraction)    Systolic dysfunction    , Hyperdynamic    Patient Active Problem List   Diagnosis Date Noted   Excessive daytime sleepiness 06/28/2023   Elevated coronary artery calcium score 133; 72nd percentile 06/26/2023   ANA positive 05/15/2023   Abnormal SPEP 05/15/2023   B12 deficiency 01/13/2023   Irritable bowel syndrome with diarrhea 09/02/2022   Full incontinence of feces 09/02/2022   Recurrent UTI 01/10/2022   Obesity (BMI 30-39.9) 03/25/2021   Hypercalcemia 12/08/2020   Iron deficiency anemia 12/08/2020   Hot flashes due to menopause 11/06/2019    Morbid obesity (HCC) 02/11/2019   Type 2 diabetes mellitus with hyperlipidemia (HCC) 02/11/2019   Diverticulitis large intestine 10/31/2013   Systolic dysfunction    Gastroesophageal reflux disease    Hypertension     Past Surgical History:  Procedure Laterality Date   APPENDECTOMY N/A 11/05/2013   Procedure: APPENDECTOMY;  Surgeon: Diantha Fossa, MD;  Location: [facility] OR;  Service: General;  Laterality: N/A;   BOWEL RESECTION N/A 11/05/2013   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  "Normal "performed by Dr. Marla Sills   COLONOSCOPY  04/03/2019   LAPAROTOMY N/A 11/05/2013   Procedure: EXPLORATORY LAPAROTOMY;  Surgeon: Diantha Fossa, MD;  Location: [facility] OR;  Service: General;  Laterality: N/A;   Lexiscan  2010   Patient reports normal Lexiscan completed around 2010 by Dr. Marla Sills.   TOTAL ABDOMINAL HYSTERECTOMY  2004    OB History     Gravida  0   Para  0   Term  0   Preterm  0   AB  0   Living  0      SAB  0   IAB  0   Ectopic  0   Multiple  0   Live Births  0  Home Medications    Prior to Admission medications   Medication Sig Start Date End Date Taking? Authorizing Provider  Ascorbic Acid (VITAMIN C) 1000 MG tablet Take 1,000 mg by mouth daily.   Yes [provider]  aspirin 81 MG tablet Take 81 mg by mouth at bedtime.    Yes [provider]  candesartan-hydrochlorothiazide (ATACAND HCT) 16-12.5 MG tablet Take 1 tablet by mouth daily. 05/11/23  Yes Kuneff, Renee A, DO  cetirizine (ZYRTEC) 10 MG tablet Take 10 mg by mouth at bedtime.    Yes [provider]  ciprofloxacin (CIPRO) 500 MG tablet Take 1 tablet (500 mg total) by mouth 2 (two) times daily. 07/22/23  Yes Stephany Ehrich, MD  citalopram (CELEXA) 20 MG tablet Take 1 tablet (20 mg total) by mouth daily. 05/11/23  Yes Kuneff, Renee A, DO  empagliflozin (JARDIANCE) 25 MG TABS tablet Take 1  tablet (25 mg total) by mouth daily. 05/11/23  Yes Kuneff, Renee A, DO  FERREX 150 150 MG capsule Take 1 capsule (150 mg total) by mouth daily. 05/11/23  Yes Kuneff, Renee A, DO  fluconazole (DIFLUCAN) 150 MG tablet Take 1 tablet (150 mg total) by mouth daily. Repeat in 1 week if needed 07/22/23  Yes Stephany Ehrich, MD  fluocinonide ointment (LIDEX) 0.05 % Apply 1 Application topically 2 (two) times daily. 11/05/21  Yes Kuneff, Renee A, DO  fluticasone (FLONASE) 50 MCG/ACT nasal spray Place 2 sprays into both nostrils daily. 05/11/23  Yes Kuneff, Renee A, DO  HYDROcodone-acetaminophen (NORCO/VICODIN) 5-325 MG tablet Take 1 tablet by mouth every 6 (six) hours as needed. 07/15/23  Yes Robinson, John K, PA-C  hydrocortisone cream 1 % Apply to affected area 3 times daily 07/22/23  Yes Stephany Ehrich, MD  linagliptin (TRADJENTA) 5 MG TABS tablet Take 1 tablet (5 mg total) by mouth daily 05/11/23  Yes Kuneff, Renee A, DO  metFORMIN (GLUCOPHAGE-XR) 750 MG 24 hr tablet Take 1 tablet (750 mg total) by mouth daily with breakfast. 05/11/23  Yes Kuneff, Renee A, DO  metoprolol succinate (TOPROL-XL) 25 MG 24 hr tablet Take 1 tablet (25 mg total) by mouth daily. Take with or immediately following a meal. Needs OV for refills 05/11/23  Yes Kuneff, Renee A, DO  omeprazole (PRILOSEC) 20 MG capsule Take 1 capsule (20 mg total) by mouth daily. 05/11/23  Yes Kuneff, Renee A, DO  rosuvastatin (CRESTOR) 40 MG tablet Take 1 tablet (40 mg total) by mouth at bedtime. 05/11/23  Yes Kuneff, Renee A, DO  timolol (TIMOPTIC) 0.25 % ophthalmic solution Place 1 drop into the left eye 2 (two) times daily. 08/21/20  Yes [provider]  Travoprost, BAK Free, (TRAVATAN) 0.004 % SOLN ophthalmic solution Place 1 drop into both eyes at bedtime.   Yes [provider]    Family History Family History  Problem Relation Age of Onset   Heart disease Mother    Heart attack Mother    Asthma Mother    Early death Mother     Miscarriages / Stillbirths Mother    Heart disease Father    Lung cancer Father    Heart attack Brother    Asthma Brother    Heart disease Brother    Kidney disease Brother    Diverticulitis Sister    Heart disease Brother    Heart attack Brother    Heart disease Brother    Heart attack Brother    Early death Maternal Grandmother  Tuberculosis Maternal Grandmother    Depression Maternal Grandfather    Heart disease Paternal Grandmother    Heart disease Sister    Colon cancer Neg Hx    Esophageal cancer Neg Hx    Rectal cancer Neg Hx    Stomach cancer Neg Hx     Social History Social History   Tobacco Use   Smoking status: Never    Passive exposure: Never   Smokeless tobacco: Never  Vaping Use   Vaping status: Never Used  Substance Use Topics   Alcohol use: Not Currently    Comment: Occassional drinker   Drug use: No     Allergies   Ivp dye [iodinated contrast media], Latanoprost, Lisinopril, Onion, and Sulfa antibiotics   Review of Systems Review of Systems See HPI  Physical Exam Triage Vital Signs ED Triage Vitals  Encounter Vitals Group     BP 07/22/23 0937 119/70     Systolic BP Percentile --      Diastolic BP Percentile --      Pulse Rate 07/22/23 0937 (!) 53     Resp 07/22/23 0937 16     Temp 07/22/23 0937 97.9 F (36.6 C)     Temp src --      SpO2 07/22/23 0937 96 %     Weight 07/22/23 0935 200 lb (90.7 kg)     Height 07/22/23 0935 5\' 3"  (1.6 m)     Head Circumference --      Peak Flow --      Pain Score 07/22/23 0935 10     Pain Loc --      Pain Education --      Exclude from Growth Chart --    No data found.  Updated Vital Signs BP 119/70 (BP Location: Right Arm)   Pulse (!) 53   Temp 97.9 F (36.6 C)   Resp 16   Ht 5\' 3"  (1.6 m)   Wt 90.7 kg   SpO2 96%   BMI 35.43 kg/m      Physical Exam Constitutional:      General: She is not in acute distress.    Appearance: She is well-developed.     Comments: Overweight.   Uncomfortable  HENT:     Head: Normocephalic and atraumatic.     Mouth/Throat:     Mouth: Mucous membranes are moist.  Eyes:     Conjunctiva/sclera: Conjunctivae normal.     Pupils: Pupils are equal, round, and reactive to light.  Cardiovascular:     Rate and Rhythm: Normal rate and regular rhythm.     Heart sounds: Normal heart sounds.  Pulmonary:     Effort: Pulmonary effort is normal. No respiratory distress.     Breath sounds: Normal breath sounds.  Abdominal:     General: Abdomen is protuberant. Bowel sounds are normal. There is no distension.     Palpations: Abdomen is soft. There is no hepatomegaly or splenomegaly.     Tenderness: There is abdominal tenderness in the left lower quadrant. There is no guarding or rebound.     Comments: Abdomen is soft.  There is tenderness to deep palpation in the left lower quadrant.  No guarding or rebound.  Moderately tender.  No mass palpable.  No organomegaly  Musculoskeletal:        General: Normal range of motion.     Cervical back: Normal range of motion.  Skin:    General: Skin is warm and dry.  Neurological:  Mental Status: She is alert.      UC Treatments / Results  Labs (all labs ordered are listed, but only abnormal results are displayed) Labs Reviewed  POCT URINALYSIS DIP (MANUAL ENTRY) - Abnormal; Notable for the following components:      Result Value   Glucose, UA >=1,000 (*)    Leukocytes, UA Trace (*)    All other components within normal limits  URINE CULTURE  CBC WITH DIFFERENTIAL/PLATELET    EKG   Radiology No results found.  Procedures Procedures (including critical care time)  Medications Ordered in UC Medications - No data to display  Initial Impression / Assessment and Plan / UC Course  I have reviewed the triage vital signs and the nursing notes.  Pertinent labs & imaging results that were available during my care of the patient were reviewed by me and considered in my medical decision  making (see chart for details).     Urinalysis is minimally positive.  Concern for UTI.  Concern for continuing diverticular pain given patient's left lower quadrant tenderness and pain.  Patient has a yeast infection from the antibiotics. Will going to culture her urine Going to repeat her CBC Going to put her on a course of Cipro I also have given her Diflucan for her yeast infection, hydrocortisone for the itch She has a follow-up appoint with her doctor for next week Final Clinical Impressions(s) / UC Diagnoses   Final diagnoses:  Urine frequency  Diverticulitis of colon  Abdominal pain, left lower quadrant     Discharge Instructions      Take Cipro 2 times a day.  This is a good urinary antibiotic Drink lots of water Take Diflucan 1 pill now.  Repeat in 1 week if needed. May use the vaginal cream for itching See your doctor next week  You can see your white blood count result on MyChart.  I will call you tomorrow if it is still elevated   ED Prescriptions     Medication Sig Dispense Auth. Provider   ciprofloxacin (CIPRO) 500 MG tablet Take 1 tablet (500 mg total) by mouth 2 (two) times daily. 14 tablet Stephany Ehrich, MD   fluconazole (DIFLUCAN) 150 MG tablet Take 1 tablet (150 mg total) by mouth daily. Repeat in 1 week if needed 2 tablet Stephany Ehrich, MD   hydrocortisone cream 1 % Apply to affected area 3 times daily 15 g Stephany Ehrich, MD      PDMP not reviewed this encounter.   Stephany Ehrich, MD 07/22/23 1044

## 2023-07-22 NOTE — Discharge Instructions (Signed)
 Take Cipro 2 times a day.  This is a good urinary antibiotic Drink lots of water Take Diflucan 1 pill now.  Repeat in 1 week if needed. May use the vaginal cream for itching See your doctor next week  You can see your white blood count result on MyChart.  I will call you tomorrow if it is still elevated

## 2023-07-22 NOTE — ED Triage Notes (Signed)
 Pt states that she has some lower abdominal pressure and urinary frequency x2 days

## 2023-07-24 LAB — URINE CULTURE
Culture: NO GROWTH
Special Requests: NORMAL

## 2023-07-25 ENCOUNTER — Ambulatory Visit (INDEPENDENT_AMBULATORY_CARE_PROVIDER_SITE_OTHER): Admitting: Family Medicine

## 2023-07-25 ENCOUNTER — Encounter: Payer: Self-pay | Admitting: Family Medicine

## 2023-07-25 VITALS — BP 126/78 | HR 71 | Temp 98.2°F | Wt 191.6 lb

## 2023-07-25 DIAGNOSIS — K5792 Diverticulitis of intestine, part unspecified, without perforation or abscess without bleeding: Secondary | ICD-10-CM | POA: Diagnosis not present

## 2023-07-25 DIAGNOSIS — R1032 Left lower quadrant pain: Secondary | ICD-10-CM

## 2023-07-25 LAB — CBC WITH DIFFERENTIAL/PLATELET
Basophils Absolute: 0.1 10*3/uL (ref 0.0–0.1)
Basophils Relative: 1.1 % (ref 0.0–3.0)
Eosinophils Absolute: 0.3 10*3/uL (ref 0.0–0.7)
Eosinophils Relative: 2.8 % (ref 0.0–5.0)
HCT: 37.6 % (ref 36.0–46.0)
Hemoglobin: 12.3 g/dL (ref 12.0–15.0)
Lymphocytes Relative: 30.8 % (ref 12.0–46.0)
Lymphs Abs: 3.4 10*3/uL (ref 0.7–4.0)
MCHC: 32.8 g/dL (ref 30.0–36.0)
MCV: 82.5 fl (ref 78.0–100.0)
Monocytes Absolute: 1 10*3/uL (ref 0.1–1.0)
Monocytes Relative: 8.8 % (ref 3.0–12.0)
Neutro Abs: 6.2 10*3/uL (ref 1.4–7.7)
Neutrophils Relative %: 56.5 % (ref 43.0–77.0)
Platelets: 487 10*3/uL — ABNORMAL HIGH (ref 150.0–400.0)
RBC: 4.55 Mil/uL (ref 3.87–5.11)
RDW: 16.2 % — ABNORMAL HIGH (ref 11.5–15.5)
WBC: 11.1 10*3/uL — ABNORMAL HIGH (ref 4.0–10.5)

## 2023-07-25 LAB — C-REACTIVE PROTEIN: CRP: 3.1 mg/dL (ref 0.5–20.0)

## 2023-07-25 NOTE — Patient Instructions (Addendum)

## 2023-07-25 NOTE — Progress Notes (Signed)
 Carol [name] , 09/23/1955, 68 y.o., female MRN: [id number] Patient Care Team    Relationship Specialty Notifications Start End  Natalia Leatherwood, DO PCP - General Family Medicine  06/10/19   Jake Bathe, MD Consulting Physician Cardiology  06/12/19   Iva Boop, MD Consulting Physician Gastroenterology  06/12/19   Elwin Mocha, MD Consulting Physician Ophthalmology  06/12/19   Frankey Poot, MD Referring Physician Obstetrics and Gynecology  06/12/19   Jessica Priest, MD Consulting Physician Allergy and Immunology  06/12/19   [facility]  07/26/21   Larey Dresser, DPM Consulting Physician Podiatry  05/10/23     Chief Complaint  Patient presents with   Hospitalization Follow-up    ED follow up.      Subjective: Carol [name] is a 68 y.o. Pt presents for an OV to follow-up on multiple ED visits surrounding diverticulitis and UTI evaluations and treatments.  Patient was diagnosed with diverticulitis with an elevated WBC approximately 16.5 and a CT abdomen positive for sigmoid diverticulitis.  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  She did not have a bowel resection at that time.  Since then she has struggled with intermittent abdominal pain and diarrhea. Most recently event she was treated with Augmentin twice daily, which then created a yeast infection.  She was treated with Diflucan and started on Cipro for UTI. She reports today she is tolerating soft bland foods for now.  She is hydrating well.  Urinary tract infection symptoms have resolved.  She still has Cipro twice daily till the end of the week.  She has not had any additional fever since last Wednesday.  She does report mild discomfort remains in her left lower quadrant.  She is having bowel movements without blood, and now they are not painful.  Last colonoscopy was 2020 with Dr. Leone Payor.     02/13/2023    1:19 PM 09/02/2022    8:53  AM 08/02/2022    2:23 PM 07/13/2022    9:33 AM 01/10/2022    8:25 AM  Depression screen PHQ 2/9  Decreased Interest 1 0 0 0 0  Down, Depressed, Hopeless 0 0 0 0 0  PHQ - 2 Score 1 0 0 0 0  Altered sleeping 0 0     Tired, decreased energy 3 0     Change in appetite 0 0     Feeling bad or failure about yourself  0 0     Trouble concentrating 0 0     Moving slowly or fidgety/restless 0 0     Suicidal thoughts 0 0     PHQ-9 Score 4 0     Difficult doing work/chores Not difficult at all Not difficult at all       Allergies  Allergen Reactions   Ivp Dye [Iodinated Contrast Media] Hives and Shortness Of Breath    Did ok in OP setting with 13 hr Premedications    Latanoprost    Lisinopril Cough   Onion Rash    Rash and GI upset   Sulfa Antibiotics Hives   Social History   Social History Narrative   Marital status/children/pets: Married.   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   Safety:      -smoke alarm in the home:Yes     - wears seatbelt: Yes     - Feels safe in their  relationships: Yes   Past Medical History:  Diagnosis Date   Angio-edema    Blood type O+    Chest discomfort    , Atypical   Chicken pox    COVID-19 05/09/2019   Diabetic acidosis, type II (HCC)    Diverticulitis - large vs small bowel vs both?    Gastroesophageal reflux disease    Glaucoma    Hyperlipidemia    Hypertension    PVC (premature ventricular contraction)    Systolic dysfunction    , Hyperdynamic   Past Surgical History:  Procedure Laterality Date   APPENDECTOMY N/A 11/05/2013   Procedure: APPENDECTOMY;  Surgeon: Cherylynn Ridges, MD;  Location: [facility] OR;  Service: General;  Laterality: N/A;   BOWEL RESECTION N/A 11/05/2013   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  "Normal "performed by Dr. Ty Hilts   COLONOSCOPY  04/03/2019   LAPAROTOMY N/A 11/05/2013   Procedure: EXPLORATORY  LAPAROTOMY;  Surgeon: Cherylynn Ridges, MD;  Location: [facility] OR;  Service: General;  Laterality: N/A;   Lexiscan  2010   Patient reports normal Lexiscan completed around 2010 by Dr. Ty Hilts.   TOTAL ABDOMINAL HYSTERECTOMY  2004   Family History  Problem Relation Age of Onset   Heart disease Mother    Heart attack Mother    Asthma Mother    Early death Mother    Miscarriages / Stillbirths Mother    Heart disease Father    Lung cancer Father    Heart attack Brother    Asthma Brother    Heart disease Brother    Kidney disease Brother    Diverticulitis Sister    Heart disease Brother    Heart attack Brother    Heart disease Brother    Heart attack Brother    Early death Maternal Grandmother    Tuberculosis Maternal Grandmother    Depression Maternal Grandfather    Heart disease Paternal Grandmother    Heart disease Sister    Colon cancer Neg Hx    Esophageal cancer Neg Hx    Rectal cancer Neg Hx    Stomach cancer Neg Hx    Allergies as of 07/25/2023       Reactions   Ivp Dye [iodinated Contrast Media] Hives, Shortness Of Breath   Did ok in OP setting with 13 hr Premedications    Latanoprost    Lisinopril Cough   Onion Rash   Rash and GI upset   Sulfa Antibiotics Hives        Medication List        Accurate as of July 25, 2023 12:14 PM. If you have any questions, ask your nurse or doctor.          aspirin 81 MG tablet Take 81 mg by mouth at bedtime.   candesartan-hydrochlorothiazide 16-12.5 MG tablet Commonly known as: ATACAND HCT Take 1 tablet by mouth daily.   cetirizine 10 MG tablet Commonly known as: ZYRTEC Take 10 mg by mouth at bedtime.   ciprofloxacin 500 MG tablet Commonly known as: CIPRO Take 1 tablet (500 mg total) by mouth 2 (two) times daily.   citalopram 20 MG tablet Commonly known as: CELEXA Take 1 tablet (20 mg total) by mouth daily.   empagliflozin 25 MG Tabs tablet Commonly known as: Jardiance Take 1 tablet (25 mg total) by mouth  daily.   Ferrex 150 150 MG capsule Generic drug: iron polysaccharides Take 1  capsule (150 mg total) by mouth daily.   fluconazole 150 MG tablet Commonly known as: Diflucan Take 1 tablet (150 mg total) by mouth daily. Repeat in 1 week if needed   fluocinonide ointment 0.05 % Commonly known as: LIDEX Apply 1 Application topically 2 (two) times daily.   fluticasone 50 MCG/ACT nasal spray Commonly known as: FLONASE Place 2 sprays into both nostrils daily.   HYDROcodone-acetaminophen 5-325 MG tablet Commonly known as: NORCO/VICODIN Take 1 tablet by mouth every 6 (six) hours as needed.   hydrocortisone cream 1 % Apply to affected area 3 times daily   metFORMIN 750 MG 24 hr tablet Commonly known as: GLUCOPHAGE-XR Take 1 tablet (750 mg total) by mouth daily with breakfast.   metoprolol succinate 25 MG 24 hr tablet Commonly known as: TOPROL-XL Take 1 tablet (25 mg total) by mouth daily. Take with or immediately following a meal. Needs OV for refills   omeprazole 20 MG capsule Commonly known as: PRILOSEC Take 1 capsule (20 mg total) by mouth daily.   rosuvastatin 40 MG tablet Commonly known as: CRESTOR Take 1 tablet (40 mg total) by mouth at bedtime.   timolol 0.25 % ophthalmic solution Commonly known as: TIMOPTIC Place 1 drop into the left eye 2 (two) times daily.   Tradjenta 5 MG Tabs tablet Generic drug: linagliptin Take 1 tablet (5 mg total) by mouth daily   Travoprost (BAK Free) 0.004 % Soln ophthalmic solution Commonly known as: TRAVATAN Place 1 drop into both eyes at bedtime.   vitamin C 1000 MG tablet Take 1,000 mg by mouth daily.        All past medical history, surgical history, allergies, family history, immunizations andmedications were updated in the EMR today and reviewed under the history and medication portions of their EMR.     ROS Negative, with the exception of above mentioned in HPI  Objective:  BP 126/78   Pulse 71   Temp 98.2 F (36.8  C)   Wt 191 lb 9.6 oz (86.9 kg)   SpO2 95%   BMI 33.94 kg/m  Body mass index is 33.94 kg/m. Physical Exam Vitals and nursing note reviewed.  Constitutional:      General: She is not in acute distress.    Appearance: Normal appearance. She is normal weight. She is not ill-appearing or toxic-appearing.  HENT:     Head: Normocephalic and atraumatic.  Eyes:     General: No scleral icterus.       Right eye: No discharge.        Left eye: No discharge.     Extraocular Movements: Extraocular movements intact.     Conjunctiva/sclera: Conjunctivae normal.     Pupils: Pupils are equal, round, and reactive to light.  Cardiovascular:     Rate and Rhythm: Normal rate and regular rhythm.  Abdominal:     General: Bowel sounds are normal. There is distension.     Palpations: Abdomen is soft. There is no mass.     Tenderness: There is abdominal tenderness (Left lower quadrant). There is guarding. There is no rebound.     Hernia: No hernia is present.  Skin:    Findings: No rash.  Neurological:     Mental Status: She is alert and oriented to person, place, and time. Mental status is at baseline.     Motor: No weakness.     Coordination: Coordination normal.     Gait: Gait normal.  Psychiatric:  Mood and Affect: Mood normal.        Behavior: Behavior normal.        Thought Content: Thought content normal.        Judgment: Judgment normal.     No results found. No results found. No results found for this or any previous visit (from the past 24 hours).  Assessment/Plan: Carol [name] is a 68 y.o. female present for OV for  Diverticulitis (Primary)/left lower quadrant pain Patient with history of interloop abscess in 2015, with more frequent abdominal discomfort and diarrhea over the last few years.  Most recently seen in the emergency room for diverticulitis flare treated with Augmentin. Patient is still mildly tender on exam today.  She is tolerating p.o., but still not back to  her regular diet.  No fevers in 7 days. Currently taking Cipro twice daily for presumed UTI Discussed she has had more frequent abdominal discomfort/fevers and episodes of diarrhea, encouraged her to follow-up with GI for further evaluation and the need for possible early colonoscopy repeat.  She agreed a referral was placed back to them today Repeat CBC and collect CRP to ensure WBCs are trending down - CBC w/Diff - C-reactive protein - Ambulatory referral to Gastroenterology Depending upon results, may consider extending Cipro with Flagyl another week, since she is still having some discomfort of the left lower quadrant.  Reviewed expectations re: course of current medical issues. Discussed self-management of symptoms. Outlined signs and symptoms indicating need for more acute intervention. Patient verbalized understanding and all questions were answered. Patient received an After-Visit Summary.    Orders Placed This Encounter  Procedures   CBC w/Diff   C-reactive protein   Ambulatory referral to Gastroenterology   No orders of the defined types were placed in this encounter.  Referral Orders         Ambulatory referral to Gastroenterology       Note is dictated utilizing voice recognition software. Although note has been proof read prior to signing, occasional typographical errors still can be missed. If any questions arise, please do not hesitate to call for verification.   electronically signed by:  Napolean Backbone, DO  [facility] [place]

## 2023-07-26 ENCOUNTER — Encounter: Payer: Self-pay | Admitting: Family Medicine

## 2023-07-26 ENCOUNTER — Encounter: Payer: Self-pay | Admitting: Gastroenterology

## 2023-07-26 ENCOUNTER — Other Ambulatory Visit: Payer: Self-pay | Admitting: Family Medicine

## 2023-07-26 ENCOUNTER — Ambulatory Visit (INDEPENDENT_AMBULATORY_CARE_PROVIDER_SITE_OTHER): Payer: Medicare Other | Admitting: *Deleted

## 2023-07-26 DIAGNOSIS — Z0000 Encounter for general adult medical examination without abnormal findings: Secondary | ICD-10-CM | POA: Diagnosis not present

## 2023-07-26 MED ORDER — CIPROFLOXACIN HCL 500 MG PO TABS: 500.0000 mg | ORAL_TABLET | Freq: Two times a day (BID) | ORAL | 0 refills | Status: DC

## 2023-07-26 MED ORDER — METRONIDAZOLE 500 MG PO TABS: 500.0000 mg | ORAL_TABLET | Freq: Two times a day (BID) | ORAL | 0 refills | Status: AC

## 2023-07-26 NOTE — Progress Notes (Addendum)
 Subjective:   Carol [name] is a 68 y.o. female who presents for Medicare Annual (Subsequent) preventive examination.  Visit Complete: Virtual I connected with  [name] on 07/26/23 by a audio enabled telemedicine application and verified that I am speaking with the correct person using two identifiers.  Patient Location: Home  Provider Location: Home Office  I discussed the limitations of evaluation and management by telemedicine. The patient expressed understanding and agreed to proceed.  Vital Signs: Because this visit was a virtual/telehealth visit, some criteria may be missing or patient reported. Any vitals not documented were not able to be obtained and vitals that have been documented are patient reported.    Cardiac Risk Factors include: advanced age (>44men, >2 women);diabetes mellitus;obesity (BMI >30kg/m2);family history of premature cardiovascular disease     Objective:    There were no vitals filed for this visit. There is no height or weight on file to calculate BMI.     07/26/2023    9:46 AM 07/17/2023   12:13 PM 07/15/2023   10:19 AM 07/13/2022    9:38 AM 03/21/2022    4:39 PM 07/06/2021    1:43 PM 02/11/2019    6:00 PM  Advanced Directives  Does Patient Have a Medical Advance Directive? Yes Yes Yes Yes No Yes No  Type of Estate agent of [place];Living will Healthcare Power of [place];Living will  Healthcare Power of [place];Living will  Living will;Healthcare Power of Attorney   Does patient want to make changes to medical advance directive?      No - Patient declined   Copy of Healthcare Power of Attorney in Chart? No - copy requested        Would patient like information on creating a medical advance directive?     No - Patient declined  No - Patient declined    Current Medications (verified) Outpatient Encounter Medications as of 07/26/2023  Medication Sig   Ascorbic Acid (VITAMIN C) 1000 MG tablet Take 1,000 mg by mouth daily.    aspirin 81 MG tablet Take 81 mg by mouth at bedtime.    candesartan-hydrochlorothiazide (ATACAND HCT) 16-12.5 MG tablet Take 1 tablet by mouth daily.   cetirizine (ZYRTEC) 10 MG tablet Take 10 mg by mouth at bedtime.    ciprofloxacin (CIPRO) 500 MG tablet Take 1 tablet (500 mg total) by mouth 2 (two) times daily.   citalopram (CELEXA) 20 MG tablet Take 1 tablet (20 mg total) by mouth daily.   empagliflozin (JARDIANCE) 25 MG TABS tablet Take 1 tablet (25 mg total) by mouth daily.   FERREX 150 150 MG capsule Take 1 capsule (150 mg total) by mouth daily.   fluconazole (DIFLUCAN) 150 MG tablet Take 1 tablet (150 mg total) by mouth daily. Repeat in 1 week if needed   fluocinonide ointment (LIDEX) 0.05 % Apply 1 Application topically 2 (two) times daily.   fluticasone (FLONASE) 50 MCG/ACT nasal spray Place 2 sprays into both nostrils daily.   HYDROcodone-acetaminophen (NORCO/VICODIN) 5-325 MG tablet Take 1 tablet by mouth every 6 (six) hours as needed.   hydrocortisone cream 1 % Apply to affected area 3 times daily   linagliptin (TRADJENTA) 5 MG TABS tablet Take 1 tablet (5 mg total) by mouth daily   metFORMIN (GLUCOPHAGE-XR) 750 MG 24 hr tablet Take 1 tablet (750 mg total) by mouth daily with breakfast.   metoprolol succinate (TOPROL-XL) 25 MG 24 hr tablet Take 1 tablet (25 mg total) by mouth daily. Take  with or immediately following a meal. Needs OV for refills   omeprazole (PRILOSEC) 20 MG capsule Take 1 capsule (20 mg total) by mouth daily.   rosuvastatin (CRESTOR) 40 MG tablet Take 1 tablet (40 mg total) by mouth at bedtime.   timolol (TIMOPTIC) 0.25 % ophthalmic solution Place 1 drop into the left eye 2 (two) times daily.   Travoprost, BAK Free, (TRAVATAN) 0.004 % SOLN ophthalmic solution Place 1 drop into both eyes at bedtime.   No facility-administered encounter medications on file as of 07/26/2023.    Allergies (verified) Ivp dye [iodinated contrast media], Latanoprost, Lisinopril,  Onion, and Sulfa antibiotics   History: Past Medical History:  Diagnosis Date   Angio-edema    Blood type O+    Chest discomfort    , Atypical   Chicken pox    COVID-19 05/09/2019   Diabetic acidosis, type II (HCC)    Diverticulitis - large vs small bowel vs both?    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  "Normal "performed by Dr. Ty Hilts   COLONOSCOPY  04/03/2019   LAPAROTOMY N/A 11/05/2013   Procedure: EXPLORATORY LAPAROTOMY;  Surgeon: Cherylynn Ridges, MD;  Location: [facility] OR;  Service: General;  Laterality: N/A;   Lexiscan  2010   Patient reports normal Lexiscan completed around 2010 by Dr. Ty Hilts.   TOTAL ABDOMINAL HYSTERECTOMY  2004   Family History  Problem Relation Age of Onset   Heart disease Mother    Heart attack Mother    Asthma Mother    Early death Mother    Miscarriages / Stillbirths Mother    Heart disease Father    Lung cancer Father    Heart attack Brother    Asthma Brother    Heart disease Brother    Kidney disease Brother    Diverticulitis Sister    Heart disease Brother    Heart attack Brother    Heart disease Brother    Heart attack Brother    Early death Maternal Grandmother    Tuberculosis Maternal Grandmother    Depression Maternal Grandfather    Heart disease Paternal Grandmother    Heart disease Sister    Colon cancer Neg Hx    Esophageal cancer Neg Hx    Rectal cancer Neg Hx    Stomach cancer Neg Hx    Social History   Socioeconomic History   Marital status: Married     Spouse name: Not on file   Number of children: Not on file   Years of education: Not on file   Highest education level: Not on file  Occupational History   Not on file  Tobacco Use   Smoking status: Never    Passive exposure: Never   Smokeless tobacco: Never  Vaping Use   Vaping status: Never Used  Substance and Sexual Activity   Alcohol use: Not Currently    Comment: Occassional drinker   Drug use: No   Sexual activity: Not Currently  Other Topics Concern  Not on file  Social History Narrative   Marital status/children/pets: Married.   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   Safety:      -smoke alarm in the home:Yes     - wears seatbelt: Yes     - Feels safe in their relationships: Yes   Social Drivers of Corporate investment banker Strain: Low Risk  (07/26/2023)   Overall Financial Resource Strain (CARDIA)    Difficulty of Paying Living Expenses: Not hard at all  Food Insecurity: No Food Insecurity (07/26/2023)   Hunger Vital Sign    Worried About Running Out of Food in the Last Year: Never true    Ran Out of Food in the Last Year: Never true  Transportation Needs: No Transportation Needs (07/26/2023)   PRAPARE - Administrator, Civil Service (Medical): No    Lack of Transportation (Non-Medical): No  Physical Activity: Inactive (07/26/2023)   Exercise Vital Sign    Days of Exercise per Week: 0 days    Minutes of Exercise per Session: 0 min  Stress: No Stress Concern Present (07/26/2023)   [employer] of Occupational Health - Occupational Stress Questionnaire    Feeling of Stress : Not at all  Social Connections: Moderately Integrated (07/26/2023)   Social Connection and Isolation Panel [NHANES]    Frequency of Communication with Friends and Family: More than three times a week    Frequency of Social Gatherings with Friends and Family: Three times a week    Attends Religious Services: More than 4 times per year     Active Member of Clubs or Organizations: No    Attends Banker Meetings: Never    Marital Status: Married    Tobacco Counseling Counseling given: Not Answered   Clinical Intake:  Pre-visit preparation completed: Yes  Pain : No/denies pain     Diabetes: Yes CBG done?: No Did pt. bring in CBG monitor from home?: No  How often do you need to have someone help you when you read instructions, pamphlets, or other written materials from your doctor or pharmacy?: 1 - Never  Interpreter Needed?: No  Information entered by :: Kieth Pelt LPN   Activities of Daily Living    07/26/2023    9:37 AM  In your present state of health, do you have any difficulty performing the following activities:  Hearing? 0  Vision? 0  Difficulty concentrating or making decisions? 0  Walking or climbing stairs? 0  Dressing or bathing? 0  Doing errands, shopping? 0  Preparing Food and eating ? N  Using the Toilet? N  In the past six months, have you accidently leaked urine? N  Do you have problems with loss of bowel control? N  Managing your Medications? N  Managing your Finances? N    Patient Care Team: Mariel Shope, DO as PCP - General (Family Medicine) Hugh Madura, MD as Consulting Physician (Cardiology) Kenney Peacemaker, MD as Consulting Physician (Gastroenterology) Silverio Drought, MD as Consulting Physician (Ophthalmology) Vanetta Generous, MD as Referring Physician (Obstetrics and Gynecology) Jerelene Monday Rema Care, MD as Consulting Physician (Allergy and Immunology) [facility] (Podiatry) Radene Buffalo, DPM as Consulting Physician (Podiatry)  Indicate any recent Medical Services you may have received from other than [facility] providers in the past year (date may be approximate).     Assessment:   This is a routine wellness examination for [place].  Hearing/Vision screen Hearing Screening -  Comments:: No trouble hearing Vision Screening -  Comments:: Up to date [facility]    Goals Addressed             This Visit's Progress    Patient Stated       Continue current lifestyle      Depression Screen    07/26/2023    9:40 AM 02/13/2023    1:19 PM 09/02/2022    8:53 AM 08/02/2022    2:23 PM 07/13/2022    9:33 AM 01/10/2022    8:25 AM 07/26/2021    8:09 AM  PHQ 2/9 Scores  PHQ - 2 Score 0 1 0 0 0 0 0  PHQ- 9 Score 0 4 0    3    Fall Risk    07/26/2023    9:34 AM 02/13/2023    1:18 PM 09/02/2022    8:53 AM 08/02/2022    2:23 PM 07/13/2022    9:39 AM  Fall Risk   Falls in the past year? 1 0 0 1 0  Number falls in past yr: 0 0  0 0  Injury with Fall? 0 0 0 1 0  Risk for fall due to :  No Fall Risks   No Fall Risks  Follow up Falls evaluation completed;Education provided;Falls prevention discussed Falls evaluation completed  Falls evaluation completed Falls evaluation completed    MEDICARE RISK AT HOME: Medicare Risk at Home Any stairs in or around the home?: Yes If so, are there any without handrails?: No Home free of loose throw rugs in walkways, pet beds, electrical cords, etc?: Yes Adequate lighting in your home to reduce risk of falls?: Yes Life alert?: No Use of a cane, walker or w/c?: No Grab bars in the bathroom?: No Shower chair or bench in shower?: No Elevated toilet seat or a handicapped toilet?: No  TIMED UP AND GO:  Was the test performed?  No    Cognitive Function:        07/26/2023    9:36 AM 07/13/2022    9:40 AM 07/06/2021    1:43 PM  6CIT Screen  What Year? 0 points 0 points 0 points  What month? 0 points 0 points 0 points  What time? 0 points 0 points 0 points  Count back from 20 0 points 0 points 0 points  Months in reverse 0 points 0 points 0 points  Repeat phrase 0 points 0 points 0 points  Total Score 0 points 0 points 0 points    Immunizations Immunization History  Administered Date(s) Administered   Fluad Quad(high Dose 65+) 02/21/2020, 01/06/2021, 03/02/2022,  12/22/2022   Influenza,inj,Quad PF,6+ Mos 03/30/2019   PFIZER(Purple Top)SARS-COV-2 Vaccination 07/02/2019, 07/30/2019, 01/19/2020   Pfizer Covid-19 Vaccine Bivalent Booster 72yrs & up 02/12/2021   Pneumococcal Conjugate-13 03/11/2020   Pneumococcal Polysaccharide-23 03/24/2021   Tdap 08/27/2012, 12/22/2022   Zoster Recombinant(Shingrix) 11/05/2019, 02/03/2020    TDAP status: Up to date  Flu Vaccine status: Up to date  Pneumococcal vaccine status: Up to date  Covid-19 vaccine status: Information provided on how to obtain vaccines.   Qualifies for Shingles Vaccine? No   Zostavax completed Yes   Shingrix Completed?: No.    Education has been provided regarding the importance of this vaccine. Patient has been advised to call insurance company to determine out of pocket expense if they have not yet received this vaccine. Advised may also receive vaccine at local pharmacy or Health Dept. Verbalized acceptance and understanding.  Screening Tests Health Maintenance  Topic Date Due   OPHTHALMOLOGY EXAM  02/03/2023   MAMMOGRAM  06/25/2023   FOOT EXAM  09/02/2023   HEMOGLOBIN A1C  11/07/2023   INFLUENZA VACCINE  11/10/2023   Diabetic kidney evaluation - Urine ACR  01/11/2024   Diabetic kidney evaluation - eGFR measurement  07/16/2024   Medicare Annual Wellness (AWV)  07/25/2024   Colonoscopy  04/02/2029   DEXA SCAN  03/18/2030   DTaP/Tdap/Td (3 - Td or Tdap) 12/21/2032   Pneumonia Vaccine 25+ Years old  Completed   Hepatitis C Screening  Completed   Zoster Vaccines- Shingrix  Completed   HPV VACCINES  Aged Out   Meningococcal B Vaccine  Aged Out   COVID-19 Vaccine  Discontinued    Health Maintenance  Health Maintenance Due  Topic Date Due   OPHTHALMOLOGY EXAM  02/03/2023   MAMMOGRAM  06/25/2023    Colorectal cancer screening: Type of screening: Colonoscopy. Completed 2020. Repeat every 10 years  Mammogram  Education provided  Bone Density status: Completed 2021. Results  reflect: Bone density results: NORMAL. Repeat every 0 years.  Lung Cancer Screening: (Low Dose CT Chest recommended if Age 59-80 years, 20 pack-year currently smoking OR have quit w/in 15years.) does not qualify.   Lung Cancer Screening Referral:  Additional Screening:  Hepatitis C Screening: does not qualify; Completed 2021  Vision Screening: Recommended annual ophthalmology exams for early detection of glaucoma and other disorders of the eye. Is the patient up to date with their annual eye exam?  Yes  Who is the provider or what is the name of the office in which the patient attends annual eye exams? [facility] If pt is not established with a provider, would they like to be referred to a provider to establish care? No .   Dental Screening: Recommended annual dental exams for proper oral hygiene  Nutrition Risk Assessment:  Has the patient had any N/V/D within the last 2 months?  No  Does the patient have any non-healing wounds?  No  Has the patient had any unintentional weight loss or weight gain?  No   Diabetes:  Is the patient diabetic?  Yes  If diabetic, was a CBG obtained today?  No  Did the patient bring in their glucometer from home?  No  How often do you monitor your CBG's? 1 x day.   Financial Strains and Diabetes Management:  Are you having any financial strains with the device, your supplies or your medication? No .  Does the patient want to be seen by Chronic Care Management for management of their diabetes?  No  Would the patient like to be referred to a Nutritionist or for Diabetic Management?  No   Diabetic Exams:  Diabetic Eye Exam: Completed  Pt has been advised about the importance in completing this exam.   Diabetic Foot Exam: . Pt has been advised about the importance in completing this exam. .    Community Resource Referral / Chronic Care Management: CRR required this visit?  No   CCM required this visit?  No     Plan:     I have personally reviewed  and noted the following in the patient's chart:   Medical and social history Use of alcohol, tobacco or illicit drugs  Current medications and supplements including opioid prescriptions. Patient is not currently taking opioid prescriptions. Functional ability and status Nutritional status Physical activity Advanced directives List of other physicians Hospitalizations, surgeries, and ER visits in previous  12 months Vitals Screenings to include cognitive, depression, and falls Referrals and appointments  In addition, I have reviewed and discussed with patient certain preventive protocols, quality metrics, and best practice recommendations. A written personalized care plan for preventive services as well as general preventive health recommendations were provided to patient.     Kieth Pelt, LPN   [id number]   After Visit Summary: (MyChart) Due to this being a telephonic visit, the after visit summary with patients personalized plan was offered to patient via MyChart   Nurse Notes:

## 2023-07-26 NOTE — Patient Instructions (Signed)
 [name] , Thank you for taking time to come for your Medicare Wellness Visit. I appreciate your ongoing commitment to your health goals. Please review the following plan we discussed and let me know if I can assist you in the future.   Screening recommendations/referrals: Colonoscopy: up to date  Mammogram: Education provided Bone Density: up to date Recommended yearly ophthalmology/optometry visit for glaucoma screening and checkup Recommended yearly dental visit for hygiene and checkup  Vaccinations: Influenza vaccine: up to date Pneumococcal vaccine: up to date Tdap vaccine: up to date Shingles vaccine: up to date      Preventive Care 65 Years and Older, Female Preventive care refers to lifestyle choices and visits with your health care provider that can promote health and wellness. What does preventive care include? A yearly physical exam. This is also called an annual well check. Dental exams once or twice a year. Routine eye exams. Ask your health care provider how often you should have your eyes checked. Personal lifestyle choices, including: Daily care of your teeth and gums. Regular physical activity. Eating a healthy diet. Avoiding tobacco and drug use. Limiting alcohol use. Practicing safe sex. Taking low-dose aspirin every day. Taking vitamin and mineral supplements as recommended by your health care provider. What happens during an annual well check? The services and screenings done by your health care provider during your annual well check will depend on your age, overall health, lifestyle risk factors, and family history of disease. Counseling  Your health care provider may ask you questions about your: Alcohol use. Tobacco use. Drug use. Emotional well-being. Home and relationship well-being. Sexual activity. Eating habits. History of falls. Memory and ability to understand (cognition). Work and work Astronomer. Reproductive health. Screening  You  may have the following tests or measurements: Height, weight, and BMI. Blood pressure. Lipid and cholesterol levels. These may be checked every 5 years, or more frequently if you are over 59 years old. Skin check. Lung cancer screening. You may have this screening every year starting at age 103 if you have a 30-pack-year history of smoking and currently smoke or have quit within the past 15 years. Fecal occult blood test (FOBT) of the stool. You may have this test every year starting at age 58. Flexible sigmoidoscopy or colonoscopy. You may have a sigmoidoscopy every 5 years or a colonoscopy every 10 years starting at age 5. Hepatitis C blood test. Hepatitis B blood test. Sexually transmitted disease (STD) testing. Diabetes screening. This is done by checking your blood sugar (glucose) after you have not eaten for a while (fasting). You may have this done every 1-3 years. Bone density scan. This is done to screen for osteoporosis. You may have this done starting at age 30. Mammogram. This may be done every 1-2 years. Talk to your health care provider about how often you should have regular mammograms. Talk with your health care provider about your test results, treatment options, and if necessary, the need for more tests. Vaccines  Your health care provider may recommend certain vaccines, such as: Influenza vaccine. This is recommended every year. Tetanus, diphtheria, and acellular pertussis (Tdap, Td) vaccine. You may need a Td booster every 10 years. Zoster vaccine. You may need this after age 43. Pneumococcal 13-valent conjugate (PCV13) vaccine. One dose is recommended after age 20. Pneumococcal polysaccharide (PPSV23) vaccine. One dose is recommended after age 40. Talk to your health care provider about which screenings and vaccines you need and how often you need them. This  information is not intended to replace advice given to you by your health care provider. Make sure you discuss any  questions you have with your health care provider. Document Released: 04/24/2015 Document Revised: 12/16/2015 Document Reviewed: 01/27/2015 Elsevier Interactive Patient Education  2017 [employer].  Fall Prevention in the Home Falls can cause injuries. They can happen to people of all ages. There are many things you can do to make your home safe and to help prevent falls. What can I do on the outside of my home? Regularly fix the edges of walkways and driveways and fix any cracks. Remove anything that might make you trip as you walk through a door, such as a raised step or threshold. Trim any bushes or trees on the path to your home. Use bright outdoor lighting. Clear any walking paths of anything that might make someone trip, such as rocks or tools. Regularly check to see if handrails are loose or broken. Make sure that both sides of any steps have handrails. Any raised decks and porches should have guardrails on the edges. Have any leaves, snow, or ice cleared regularly. Use sand or salt on walking paths during winter. Clean up any spills in your garage right away. This includes oil or grease spills. What can I do in the bathroom? Use night lights. Install grab bars by the toilet and in the tub and shower. Do not use towel bars as grab bars. Use non-skid mats or decals in the tub or shower. If you need to sit down in the shower, use a plastic, non-slip stool. Keep the floor dry. Clean up any water that spills on the floor as soon as it happens. Remove soap buildup in the tub or shower regularly. Attach bath mats securely with double-sided non-slip rug tape. Do not have throw rugs and other things on the floor that can make you trip. What can I do in the bedroom? Use night lights. Make sure that you have a light by your bed that is easy to reach. Do not use any sheets or blankets that are too big for your bed. They should not hang down onto the floor. Have a firm chair that has side  arms. You can use this for support while you get dressed. Do not have throw rugs and other things on the floor that can make you trip. What can I do in the kitchen? Clean up any spills right away. Avoid walking on wet floors. Keep items that you use a lot in easy-to-reach places. If you need to reach something above you, use a strong step stool that has a grab bar. Keep electrical cords out of the way. Do not use floor polish or wax that makes floors slippery. If you must use wax, use non-skid floor wax. Do not have throw rugs and other things on the floor that can make you trip. What can I do with my stairs? Do not leave any items on the stairs. Make sure that there are handrails on both sides of the stairs and use them. Fix handrails that are broken or loose. Make sure that handrails are as long as the stairways. Check any carpeting to make sure that it is firmly attached to the stairs. Fix any carpet that is loose or worn. Avoid having throw rugs at the top or bottom of the stairs. If you do have throw rugs, attach them to the floor with carpet tape. Make sure that you have a light switch at the top  of the stairs and the bottom of the stairs. If you do not have them, ask someone to add them for you. What else can I do to help prevent falls? Wear shoes that: Do not have high heels. Have rubber bottoms. Are comfortable and fit you well. Are closed at the toe. Do not wear sandals. If you use a stepladder: Make sure that it is fully opened. Do not climb a closed stepladder. Make sure that both sides of the stepladder are locked into place. Ask someone to hold it for you, if possible. Clearly mark and make sure that you can see: Any grab bars or handrails. First and last steps. Where the edge of each step is. Use tools that help you move around (mobility aids) if they are needed. These include: Canes. Walkers. Scooters. Crutches. Turn on the lights when you go into a dark area.  Replace any light bulbs as soon as they burn out. Set up your furniture so you have a clear path. Avoid moving your furniture around. If any of your floors are uneven, fix them. If there are any pets around you, be aware of where they are. Review your medicines with your doctor. Some medicines can make you feel dizzy. This can increase your chance of falling. Ask your doctor what other things that you can do to help prevent falls. This information is not intended to replace advice given to you by your health care provider. Make sure you discuss any questions you have with your health care provider. Document Released: 01/22/2009 Document Revised: 09/03/2015 Document Reviewed: 05/02/2014 Elsevier Interactive Patient Education  2017 [employer].

## 2023-07-28 LAB — CBC WITH DIFFERENTIAL/PLATELET

## 2023-07-28 LAB — SPECIMEN STATUS REPORT

## 2023-08-02 DIAGNOSIS — G4733 Obstructive sleep apnea (adult) (pediatric): Secondary | ICD-10-CM | POA: Diagnosis not present

## 2023-08-07 DIAGNOSIS — G629 Polyneuropathy, unspecified: Secondary | ICD-10-CM | POA: Diagnosis not present

## 2023-08-14 NOTE — Progress Notes (Signed)
 Severe OSA. Please move up pt's appt to review results and next steps. I have virtual visits open this Friday. Thanks.

## 2023-08-18 ENCOUNTER — Telehealth: Admitting: Nurse Practitioner

## 2023-08-18 ENCOUNTER — Ambulatory Visit: Admitting: Nurse Practitioner

## 2023-08-18 ENCOUNTER — Encounter: Payer: Self-pay | Admitting: Nurse Practitioner

## 2023-08-18 DIAGNOSIS — E669 Obesity, unspecified: Secondary | ICD-10-CM | POA: Diagnosis not present

## 2023-08-18 DIAGNOSIS — G4733 Obstructive sleep apnea (adult) (pediatric): Secondary | ICD-10-CM | POA: Diagnosis not present

## 2023-08-18 NOTE — Patient Instructions (Addendum)
 Start CPAP 5-15 cmH2O, mask of choice and heated humidity, every night, minimum of 4-6 hours a night.  Change equipment as directed. Wash your tubing with warm soap and water daily, hang to dry. Wash humidifier portion weekly. Use bottled, distilled water and change daily Be aware of reduced alertness and do not drive or operate heavy machinery if experiencing this or drowsiness.  Exercise encouraged, as tolerated. Healthy weight management discussed.  Avoid or decrease alcohol consumption and medications that make you more sleepy, if possible. Notify if persistent daytime sleepiness occurs even with consistent use of PAP therapy.  We discussed how untreated sleep apnea puts an individual at risk for cardiac arrhthymias, pulm HTN, DM, stroke and increases their risk for daytime accidents. We also briefly reviewed treatment options including weight loss, side sleeping position, oral appliance, CPAP therapy or referral to ENT for possible surgical options Shared decision to start CPAP given severity of sleep apnea  Change supplies... Every month Mask cushions and/or nasal pillows CPAP machine filters Every 3 months Mask frame (not including the headgear) CPAP tubing Every 6 months Mask headgear Chin strap (if applicable) Humidifier water tub  Follow up in 10-12 weeks with Carol Raffi Milstein,NP to see how CPAP therapy is going, or sooner, if needed

## 2023-08-18 NOTE — Progress Notes (Signed)
 Patient ID: Carol [name], female     DOB: September 25, 1955, 68 y.o.      MRN: [id number]  No chief complaint on file.   Virtual Visit via Video Note  I connected with Carol [name] on 08/18/23 at  4:00 PM EDT by a video enabled telemedicine application and verified that I am speaking with the correct person using two identifiers.  Location: Patient: Home Provider: Office   I discussed the limitations of evaluation and management by telemedicine and the availability of in person appointments. The patient expressed understanding and agreed to proceed.  History of Present Illness: 68 year old female, never smoker followed for severe OSA. Past medical history significant for HTN, GERD, IBS, DM, HLD, obesity, diastolic dysfunction.    TEST/EVENTS:   07/10/2023 HST: AHI 30.7/h, SpO2 low 79%  06/28/2023: OV with Zelina Jimerson NP Discussed the use of AI scribe software for clinical note transcription with the patient, who gave verbal consent to proceed. History of Present Illness   Carol [name] is a 68 year old female who presents with daytime fatigue and suspected sleep apnea. She experiences significant daytime fatigue and morning grogginess. She wakes up not feeling well-rested. She snores when she falls asleep in a chair with her head down, waking herself up, but is unsure about snoring at night. Her niece has noted shallow breathing during sleep. No history of drowsy driving, morning headaches, sleepwalking, or narcolepsy. She has experienced sleep paralysis in the past but not recently. She reports nasal congestion at night, which sometimes requires her to get up and blow her nose. She uses a nasal spray, Flonase , before bed if she feels stuffy, which she finds helpful.  She does not take any sleep medications and typically reads for about an hour before falling asleep. She wakes up one to two times a night to use the bathroom. Goes to bed around 8-9 pm. Gets up around 6 am. No weight change over last 2  years. Never had a previous sleep study. No supplemental oxygen use. 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  She consumes about one alcoholic beverage a month, if any, and tries to avoid caffeine. She works as a Diplomatic Services operational officer. Lives with her husband. Family history of asthma, heart disease, father with lung cancer. [place] 13  08/18/2023: Today - follow up Patient presents today for follow-up to review home sleep study which revealed severe sleep apnea.  She feels unchanged compared to her last visit.  Has issues with significant daytime sleepiness and morning grogginess.  Does not feel well rested.  Has loud snoring.  Denies any issues with drowsy driving.  Here to discuss potential treatment options  Allergies  Allergen Reactions   Ivp Dye [Iodinated Contrast Media] Hives and Shortness Of Breath    Did ok in OP setting with 13 hr Premedications    Latanoprost     Lisinopril Cough   Onion Rash    Rash and GI upset   Sulfa Antibiotics Hives   Immunization History  Administered Date(s) Administered   Fluad Quad(high Dose 65+) 02/21/2020, 01/06/2021, 03/02/2022, 12/22/2022   Influenza,inj,Quad PF,6+ Mos 03/30/2019   PFIZER(Purple Top)SARS-COV-2 Vaccination 07/02/2019, 07/30/2019, 01/19/2020   Pfizer Covid-19 Vaccine Bivalent Booster 79yrs & up 02/12/2021   Pneumococcal Conjugate-13 03/11/2020   Pneumococcal Polysaccharide-23 03/24/2021   Tdap 08/27/2012, 12/22/2022   Zoster Recombinant(Shingrix) 11/05/2019, 02/03/2020   Past Medical History:  Diagnosis Date   Angio-edema    Blood type O+    Chest  discomfort    , Atypical   Chicken pox    COVID-19 05/09/2019   Diabetic acidosis, type II (HCC)    Diverticulitis - large vs small bowel vs both?    Gastroesophageal reflux disease    Glaucoma    Hyperlipidemia    Hypertension    PVC (premature ventricular contraction)    Systolic dysfunction    , Hyperdynamic    Tobacco History: Social History   Tobacco Use  Smoking  Status Never   Passive exposure: Never  Smokeless Tobacco Never   Counseling given: Not Answered   Outpatient Medications Prior to Visit  Medication Sig Dispense Refill   Ascorbic Acid (VITAMIN C) 1000 MG tablet Take 1,000 mg by mouth daily.     aspirin  81 MG tablet Take 81 mg by mouth at bedtime.      candesartan -hydrochlorothiazide (ATACAND  HCT) 16-12.5 MG tablet Take 1 tablet by mouth daily. 90 tablet 1   cetirizine (ZYRTEC) 10 MG tablet Take 10 mg by mouth at bedtime.      ciprofloxacin  (CIPRO ) 500 MG tablet Take 1 tablet (500 mg total) by mouth 2 (two) times daily. 14 tablet 0   citalopram  (CELEXA ) 20 MG tablet Take 1 tablet (20 mg total) by mouth daily. 90 tablet 1   empagliflozin  (JARDIANCE ) 25 MG TABS tablet Take 1 tablet (25 mg total) by mouth daily. 30 tablet 5   FERREX 150 150 MG capsule Take 1 capsule (150 mg total) by mouth daily. 90 capsule 3   fluconazole  (DIFLUCAN ) 150 MG tablet Take 1 tablet (150 mg total) by mouth daily. Repeat in 1 week if needed 2 tablet 0   fluocinonide  ointment (LIDEX ) 0.05 % Apply 1 Application topically 2 (two) times daily. 30 g 2   fluticasone  (FLONASE ) 50 MCG/ACT nasal spray Place 2 sprays into both nostrils daily. 16 g 6   HYDROcodone -acetaminophen  (NORCO/VICODIN) 5-325 MG tablet Take 1 tablet by mouth every 6 (six) hours as needed. 6 tablet 0   hydrocortisone  cream 1 % Apply to affected area 3 times daily 15 g 0   linagliptin  (TRADJENTA ) 5 MG TABS tablet Take 1 tablet (5 mg total) by mouth daily 90 tablet 1   metFORMIN  (GLUCOPHAGE -XR) 750 MG 24 hr tablet Take 1 tablet (750 mg total) by mouth daily with breakfast. 90 tablet 1   metoprolol  succinate (TOPROL -XL) 25 MG 24 hr tablet Take 1 tablet (25 mg total) by mouth daily. Take with or immediately following a meal. Needs OV for refills 90 tablet 1   omeprazole  (PRILOSEC) 20 MG capsule Take 1 capsule (20 mg total) by mouth daily. 90 capsule 1   rosuvastatin  (CRESTOR ) 40 MG tablet Take 1 tablet (40  mg total) by mouth at bedtime. 90 tablet 3   timolol  (TIMOPTIC ) 0.25 % ophthalmic solution Place 1 drop into the left eye 2 (two) times daily.     Travoprost, BAK Free, (TRAVATAN) 0.004 % SOLN ophthalmic solution Place 1 drop into both eyes at bedtime.     No facility-administered medications prior to visit.     Review of Systems:   Constitutional: No weight loss or gain, night sweats, fevers, chills, or lassitude. +fatigue  HEENT: No headaches, difficulty swallowing, tooth/dental problems, or sore throat. No sneezing, itching, ear ache +occasional nasal congestion, post nasal drip CV:  No chest pain, orthopnea, PND, swelling in lower extremities, anasarca, dizziness, palpitations, syncope Resp: +snoring. No shortness of breath with exertion or at rest. No excess mucus or change in color of  mucus. No productive or non-productive. No hemoptysis. No wheezing.  No chest wall deformity GI:  No heartburn, indigestion GU: No nocturia  Skin: No rash, lesions, ulcerations MSK:  No joint pain or swelling.   Neuro: No dizziness or lightheadedness.  Psych: No depression or anxiety. Mood stable.   Observations/Objective: Patient is well-developed, well-nourished in no acute distress.  Resting comfortably at home.  No labored breathing.  Speech is clear and coherent with logical content.  Patient is alert and oriented at baseline.   Assessment and Plan: Severe obstructive sleep apnea Severe obstructive sleep apnea with AHI 30.7/h and SpO2 low 79%.  Reviewed risks of untreated severe sleep apnea.  Shared decision to move forward with CPAP given severity.  Educated on proper use/care of device.  Risks/ benefits reviewed.  Orders placed for auto CPAP 5-15 cmH2O, mask of choice and heated humidity.  Healthy weight loss encouraged.  Discussed possible use of GLP-1 with Zepbound.  Encouraged her to discuss this with her PCP.  Safe driving practices reviewed.  Patient Instructions  Start CPAP 5-15  cmH2O, mask of choice and heated humidity, every night, minimum of 4-6 hours a night.  Change equipment as directed. Wash your tubing with warm soap and water daily, hang to dry. Wash humidifier portion weekly. Use bottled, distilled water and change daily Be aware of reduced alertness and do not drive or operate heavy machinery if experiencing this or drowsiness.  Exercise encouraged, as tolerated. Healthy weight management discussed.  Avoid or decrease alcohol consumption and medications that make you more sleepy, if possible. Notify if persistent daytime sleepiness occurs even with consistent use of PAP therapy.  We discussed how untreated sleep apnea puts an individual at risk for cardiac arrhthymias, pulm HTN, DM, stroke and increases their risk for daytime accidents. We also briefly reviewed treatment options including weight loss, side sleeping position, oral appliance, CPAP therapy or referral to ENT for possible surgical options Shared decision to start CPAP given severity of sleep apnea  Change supplies... Every month Mask cushions and/or nasal pillows CPAP machine filters Every 3 months Mask frame (not including the headgear) CPAP tubing Every 6 months Mask headgear Chin strap (if applicable) Humidifier water tub  Follow up in 10-12 weeks with Alston Jerry Mckoy Bhakta,NP to see how CPAP therapy is going, or sooner, if needed    Obesity (BMI 30-39.9) See above    I discussed the assessment and treatment plan with the patient. The patient was provided an opportunity to ask questions and all were answered. The patient agreed with the plan and demonstrated an understanding of the instructions.   The patient was advised to call back or seek an in-person evaluation if the symptoms worsen or if the condition fails to improve as anticipated.  I provided 31 minutes of non-face-to-face time during this encounter.   Roetta Clarke, NP

## 2023-08-18 NOTE — Assessment & Plan Note (Addendum)
 Severe obstructive sleep apnea with AHI 30.7/h and SpO2 low 79%.  Reviewed risks of untreated severe sleep apnea.  Shared decision to move forward with CPAP given severity.  Educated on proper use/care of device.  Risks/ benefits reviewed.  Orders placed for auto CPAP 5-15 cmH2O, mask of choice and heated humidity.  Healthy weight loss encouraged.  Discussed possible use of GLP-1 with Zepbound.  Encouraged her to discuss this with her PCP.  Safe driving practices reviewed.  Patient Instructions  Start CPAP 5-15 cmH2O, mask of choice and heated humidity, every night, minimum of 4-6 hours a night.  Change equipment as directed. Wash your tubing with warm soap and water daily, hang to dry. Wash humidifier portion weekly. Use bottled, distilled water and change daily Be aware of reduced alertness and do not drive or operate heavy machinery if experiencing this or drowsiness.  Exercise encouraged, as tolerated. Healthy weight management discussed.  Avoid or decrease alcohol consumption and medications that make you more sleepy, if possible. Notify if persistent daytime sleepiness occurs even with consistent use of PAP therapy.  We discussed how untreated sleep apnea puts an individual at risk for cardiac arrhthymias, pulm HTN, DM, stroke and increases their risk for daytime accidents. We also briefly reviewed treatment options including weight loss, side sleeping position, oral appliance, CPAP therapy or referral to ENT for possible surgical options Shared decision to start CPAP given severity of sleep apnea  Change supplies... Every month Mask cushions and/or nasal pillows CPAP machine filters Every 3 months Mask frame (not including the headgear) CPAP tubing Every 6 months Mask headgear Chin strap (if applicable) Humidifier water tub  Follow up in 10-12 weeks with Katie Alekai Pocock,NP to see how CPAP therapy is going, or sooner, if needed

## 2023-08-18 NOTE — Assessment & Plan Note (Signed)
 See above

## 2023-09-06 ENCOUNTER — Ambulatory Visit: Attending: Cardiology | Admitting: Cardiology

## 2023-09-06 ENCOUNTER — Telehealth: Payer: Self-pay | Admitting: Cardiology

## 2023-09-06 ENCOUNTER — Encounter: Payer: Self-pay | Admitting: Cardiology

## 2023-09-06 VITALS — BP 125/69 | HR 55 | Ht 63.0 in | Wt 190.0 lb

## 2023-09-06 DIAGNOSIS — Z01812 Encounter for preprocedural laboratory examination: Secondary | ICD-10-CM

## 2023-09-06 DIAGNOSIS — I10 Essential (primary) hypertension: Secondary | ICD-10-CM | POA: Diagnosis not present

## 2023-09-06 DIAGNOSIS — R931 Abnormal findings on diagnostic imaging of heart and coronary circulation: Secondary | ICD-10-CM | POA: Diagnosis not present

## 2023-09-06 DIAGNOSIS — R072 Precordial pain: Secondary | ICD-10-CM

## 2023-09-06 DIAGNOSIS — R0602 Shortness of breath: Secondary | ICD-10-CM

## 2023-09-06 MED ORDER — PREDNISONE 50 MG PO TABS
ORAL_TABLET | ORAL | 0 refills | Status: DC
Start: 2023-09-06 — End: 2023-10-18

## 2023-09-06 NOTE — Telephone Encounter (Signed)
 Pt c/o medication issue:  1. Name of Medication: predniSONE  (DELTASONE ) 50 MG tablet   2. How are you currently taking this medication (dosage and times per day)?   3. Are you having a reaction (difficulty breathing--STAT)? No  4. What is your medication issue? Pharmacy states that they need directions/ instructions for medication. Please advise

## 2023-09-06 NOTE — Progress Notes (Signed)
 Cardiology Office Note:  .   Date:  09/06/2023  ID:  ANGLIA BLAKLEY, DOB 12-20-1955, MRN [id number] PCP: Mariel Shope, DO  [facility] Providers Cardiologist:  None     History of Present Illness: .   Carol [name] is a 68 y.o. female Discussed the use of AI scribe software for clinical note transcription with the patient, who gave verbal consent to proceed.  History of Present Illness Carol [name] is a 68 year old female with coronary artery disease who presents with fatigue and elevated coronary calcium  score.  She has been experiencing significant fatigue since February or March, describing it as feeling tired all the time. She often needs to take a nap in the afternoon due to the fatigue, which is more pronounced than usual. She feels winded when climbing hills but not on flat surfaces. No chest pressure or pain is reported, but she experiences difficulty breathing at high altitudes.  Her past medical history includes coronary artery disease and diabetes, with recent medication adjustments leading to improved A1c levels. She is currently on aspirin  81 mg, Jardiance  25 mg, metoprolol  25 mg long-acting, rosuvastatin  40 mg, and Candesartan  hydrochlorothiazide 16/12.5 mg daily. Her current LDL is 50, with a prior LDL particle count of 1153.  In 2021, an echocardiogram showed an ejection fraction of 65%, grade one diastolic dysfunction, and trivial mitral valve regurgitation. She mentions a newly detected heart murmur, which she had not been aware of previously.  Her family history is significant for heart disease, with her grandmother's entire family, her mother, and several siblings having heart disease. Her father died of lung cancer. Her siblings have had heart surgeries, blockages, and stents placed.  She underwent a sleep study, which showed no signs of sleep apnea. She denies smoking and has no history of lung issues.     Studies Reviewed: Aaron Aas    EKG  Interpretation Date/Time:  Wednesday Sep 06 2023 09:20:36 EDT Ventricular Rate:  55 PR Interval:  204 QRS Duration:  90 QT Interval:  410 QTC Calculation: 392 R Axis:   60  Text Interpretation: Sinus bradycardia Non-specific ST-t changes When compared with ECG of 01-Nov-2013 08:40, Vent. rate has decreased BY  39 BPM Confirmed by Dorothye Gathers ([id number]) on 09/06/2023 9:45:45 AM    Results LABS LDL: 50 LDL particle: 1153 Triglycerides: 231 HbA1c: 7.4 Hb: 12.3 Cr: 0.8 ALT: 20  RADIOLOGY Coronary calcium  score: 133, 72nd percentile (06/23/2023)  DIAGNOSTIC Echocardiogram: EF 65%, grade 1 diastolic dysfunction, no significant valvular abnormalities (2021) EKG: Normal  Risk Assessment/Calculations:            Physical Exam:   VS:  BP 125/69   Pulse (!) 55   Ht 5\' 3"  (1.6 m)   Wt 190 lb (86.2 kg)   SpO2 98%   BMI 33.66 kg/m    Wt Readings from Last 3 Encounters:  09/06/23 190 lb (86.2 kg)  07/25/23 191 lb 9.6 oz (86.9 kg)  07/22/23 200 lb (90.7 kg)    GEN: Well nourished, well developed in no acute distress NECK: No JVD; No carotid bruits CARDIAC: RRR, no murmurs, no rubs, no gallops RESPIRATORY:  Clear to auscultation without rales, wheezing or rhonchi  ABDOMEN: Soft, non-tender, non-distended EXTREMITIES:  No edema; No deformity   ASSESSMENT AND PLAN: .    Assessment and Plan Assessment & Plan Elevated coronary calcium  score Coronary calcium  score of 133, placing her in the 72nd percentile, indicates calcified plaque in the coronary  arteries. Significant family history of heart disease. Symptoms include fatigue and dyspnea on exertion, possibly an anginal equivalent. Current management with aspirin  and rosuvastatin  shows effective lipid control with LDL at 50. Coronary CT scan recommended for detailed assessment of blockages or stenosis. - Order coronary CT scan to assess for significant blockages or stenosis in the coronary arteries. Possible contrast reaction -  will pre med.   Heart murmur Newly detected heart murmur. Previous echocardiogram in 2021 showed normal pump function and trivial mitral valve regurgitation. Further evaluation needed to rule out new valvular issues. Echocardiogram will assess for new valve abnormalities or changes in heart function. - Order echocardiogram to evaluate the heart murmur and assess for new valvular abnormalities.  Fatigue Persistent fatigue, particularly in the afternoon, possibly related to cardiovascular issues or other factors like sleep apnea. CPAP therapy pending despite no evidence of sleep apnea from recent study. Monitoring symptoms necessary to determine cardiac contribution to fatigue.  Type 2 diabetes mellitus Type 2 diabetes mellitus with recent medication adjustment leading to improved glycemic control. Hemoglobin A1c is 7.4, indicating suboptimal but improved control.  Hyperlipidemia Hyperlipidemia well-controlled with rosuvastatin  40 mg, achieving an LDL of 50. Previous LDL particle number was 1153. Current management effectively reduces cardiovascular risk.         Dispo: Will follow up with results  Signed, Dorothye Gathers, MD

## 2023-09-06 NOTE — Patient Instructions (Addendum)
 Medication Instructions:  The current medical regimen is effective;  continue present plan and medications.  *If you need a refill on your cardiac medications before your next appointment, please call your pharmacy*  Lab Work: Please have blood work today (BMP)  If you have labs (blood work) drawn today and your tests are completely normal, you will receive your results only by: MyChart Message (if you have MyChart) OR A paper copy in the mail If you have any lab test that is abnormal or we need to change your treatment, we will call you to review the results.  Testing/Procedures: Your physician has requested that you have an echocardiogram. Echocardiography is a painless test that uses sound waves to create images of your heart. It provides your doctor with information about the size and shape of your heart and how well your heart's chambers and valves are working. This procedure takes approximately one hour. There are no restrictions for this procedure. Please do NOT wear cologne, perfume, aftershave, or lotions (deodorant is allowed). Please arrive 15 minutes prior to your appointment time.  Please note: We ask at that you not bring children with you during ultrasound (echo/ vascular) testing. Due to room size and safety concerns, children are not allowed in the ultrasound rooms during exams. Our front office staff cannot provide observation of children in our lobby area while testing is being conducted. An adult accompanying a patient to their appointment will only be allowed in the ultrasound room at the discretion of the ultrasound technician under special circumstances. We apologize for any inconvenience.    Your cardiac CT will be scheduled at one of the below locations:   [facility] [address] [place], [place] [address] [phone]  OR  [facility] and Vascular Tower [address]  [place], [place] [address] Opening August 07, 2023  If  scheduled at [facility], please arrive at the [facility] and Children's Entrance (Entrance C2) of [facility] 30 minutes prior to test start time. You can use the FREE valet parking offered at entrance C (encouraged to control the heart rate for the test)  Proceed to the [facility] Radiology Department (first floor) to check-in and test prep.   All radiology patients and guests should use entrance C2 at [facility], accessed from [facility], even though the hospital's physical address listed is [address].    If scheduled at the Heart and Vascular Tower at [address], please enter the parking lot using the [address] entrance and use the FREE valet service at the patient drop-off area. Enter the buidling and check-in with registration on the main floor.  Please follow these instructions carefully (unless otherwise directed):  An IV will be required for this test and Nitroglycerin will be given.  Hold all erectile dysfunction medications at least 3 days (72 hrs) prior to test. (Ie viagra, cialis, sildenafil, tadalafil, etc)   On the Night Before the Test: Be sure to Drink plenty of water. Do not consume any caffeinated/decaffeinated beverages or chocolate 12 hours prior to your test. Do not take any antihistamines 12 hours prior to your test. If the patient has contrast allergy: Patient will need a prescription for Prednisone  and very clear instructions (as follows): Prednisone  50 mg - take 13 hours prior to test Take another Prednisone  50 mg 7 hours prior to test Take another Prednisone  50 mg 1 hour prior to test Take Benadryl  50 mg 1 hour  prior to test Patient must complete all four doses of above prophylactic medications. Patient will need a ride after test due to Benadryl .  On the Day of the Test: Drink plenty of water until 1 hour prior to the test. Do not eat any food 1 hour prior to test. You may take your regular  medications prior to the test.  Take metoprolol  (Lopressor ) two hours prior to test. If you take Furosemide/Hydrochlorothiazide/Spironolactone/Chlorthalidone, please HOLD on the morning of the test. Patients who wear a continuous glucose monitor MUST remove the device prior to scanning. FEMALES- please wear underwire-free bra if available, avoid dresses & tight clothing      After the Test: Drink plenty of water. After receiving IV contrast, you may experience a mild flushed feeling. This is normal. On occasion, you may experience a mild rash up to 24 hours after the test. This is not dangerous. If this occurs, you can take Benadryl  25 mg, Zyrtec, Claritin, or Allegra and increase your fluid intake. (Patients taking Tikosyn should avoid Benadryl , and may take Zyrtec, Claritin, or Allegra) If you experience trouble breathing, this can be serious. If it is severe call 911 IMMEDIATELY. If it is mild, please call our office.  We will call to schedule your test 2-4 weeks out understanding that some insurance companies will need an authorization prior to the service being performed.   For more information and frequently asked questions, please visit our website : [email or web address]  For non-scheduling related questions, please contact the cardiac imaging nurse navigator should you have any questions/concerns: Cardiac Imaging Nurse Navigators Direct Office Dial: [phone]   For scheduling needs, including cancellations and rescheduling, please call [place], [phone].   Follow-Up: At [facility], you and your health needs are our priority.  As part of our continuing mission to provide you with exceptional heart care, our providers are all part of one team.  This team includes your primary Cardiologist (physician) and Advanced Practice Providers or APPs (Physician Assistants and Nurse Practitioners) who all work together to provide you with the care you need, when you  need it.  Your next appointment:   Follow up will be based on the results of the above testing.   We recommend signing up for the patient portal called "MyChart".  Sign up information is provided on this After Visit Summary.  MyChart is used to connect with patients for Virtual Visits (Telemedicine).  Patients are able to view lab/test results, encounter notes, upcoming appointments, etc.  Non-urgent messages can be sent to your provider as well.   To learn more about what you can do with MyChart, go to [email or web address].

## 2023-09-06 NOTE — Telephone Encounter (Signed)
 Called and spoke with pharmacy.  Advised of instructions as directed (1) tablet 13 hr before, (1) 7 hr before and (1) 1 hr before CT scan.

## 2023-09-07 ENCOUNTER — Ambulatory Visit: Payer: Self-pay | Admitting: Cardiology

## 2023-09-07 LAB — BASIC METABOLIC PANEL WITH GFR
BUN/Creatinine Ratio: 34 — ABNORMAL HIGH (ref 12–28)
BUN: 28 mg/dL — ABNORMAL HIGH (ref 8–27)
CO2: 22 mmol/L (ref 20–29)
Calcium: 11.5 mg/dL — ABNORMAL HIGH (ref 8.7–10.3)
Chloride: 99 mmol/L (ref 96–106)
Creatinine, Ser: 0.82 mg/dL (ref 0.57–1.00)
Glucose: 101 mg/dL — ABNORMAL HIGH (ref 70–99)
Potassium: 4.6 mmol/L (ref 3.5–5.2)
Sodium: 139 mmol/L (ref 134–144)
eGFR: 78 mL/min/{1.73_m2} (ref >59)

## 2023-09-11 ENCOUNTER — Ambulatory Visit: Admitting: Cardiology

## 2023-09-12 ENCOUNTER — Telehealth: Payer: Self-pay

## 2023-09-12 NOTE — Telephone Encounter (Signed)
 Pt scheduled

## 2023-09-12 NOTE — Telephone Encounter (Signed)
 Her kidney function was normal, it is her calcium  level that was mildly elevated.  Please encourage her to schedule appointment so we can discuss and repeat the levels.  If she is taking any calcium  supplement I encouraged her to stop for now.

## 2023-09-12 NOTE — Telephone Encounter (Signed)
 Communication  Reason for CRM: Patient was seen at her heart doctor and did lab work. The results came back and she stated that they sent the results over to Dr. Marylee Snowball. The patient is worried about her kidney function and would like if Dr. Marylee Snowball could get back to her on what to do next.   FYI.

## 2023-09-15 ENCOUNTER — Other Ambulatory Visit (HOSPITAL_COMMUNITY)
Admission: RE | Admit: 2023-09-15 | Discharge: 2023-09-15 | Disposition: A | Discharge status: Home / Self Care | Source: Ambulatory Visit | Attending: Family Medicine | Admitting: Family Medicine

## 2023-09-15 ENCOUNTER — Encounter (HOSPITAL_COMMUNITY): Payer: Self-pay

## 2023-09-15 ENCOUNTER — Ambulatory Visit (INDEPENDENT_AMBULATORY_CARE_PROVIDER_SITE_OTHER): Admitting: Family Medicine

## 2023-09-15 ENCOUNTER — Telehealth: Admitting: Nurse Practitioner

## 2023-09-15 ENCOUNTER — Encounter: Payer: Self-pay | Admitting: Family Medicine

## 2023-09-15 DIAGNOSIS — R778 Other specified abnormalities of plasma proteins: Secondary | ICD-10-CM | POA: Diagnosis not present

## 2023-09-15 DIAGNOSIS — N898 Other specified noninflammatory disorders of vagina: Secondary | ICD-10-CM | POA: Diagnosis present

## 2023-09-15 LAB — COMPREHENSIVE METABOLIC PANEL WITH GFR
ALT: 16 U/L (ref 0–35)
AST: 19 U/L (ref 0–37)
Albumin: 4.6 g/dL (ref 3.5–5.2)
Alkaline Phosphatase: 75 U/L (ref 39–117)
BUN: 24 mg/dL — ABNORMAL HIGH (ref 6–23)
CO2: 30 meq/L (ref 19–32)
Calcium: 10.6 mg/dL — ABNORMAL HIGH (ref 8.4–10.5)
Chloride: 97 meq/L (ref 96–112)
Creatinine, Ser: 0.86 mg/dL (ref 0.40–1.20)
GFR: 69.35 mL/min (ref >60.00)
Glucose, Bld: 110 mg/dL — ABNORMAL HIGH (ref 70–99)
Potassium: 4.2 meq/L (ref 3.5–5.1)
Sodium: 136 meq/L (ref 135–145)
Total Bilirubin: 0.5 mg/dL (ref 0.2–1.2)
Total Protein: 7.8 g/dL (ref 6.0–8.3)

## 2023-09-15 NOTE — Patient Instructions (Signed)

## 2023-09-15 NOTE — Progress Notes (Signed)
 Carol [name] , June 08, 1955, 68 y.o., female MRN: [id number] Patient Care Team    Relationship Specialty Notifications Start End  Mariel Shope, DO PCP - General Family Medicine  06/10/19   Hugh Madura, MD Consulting Physician Cardiology  06/12/19   Kenney Peacemaker, MD Consulting Physician Gastroenterology  06/12/19   Silverio Drought, MD Consulting Physician Ophthalmology  06/12/19   Vanetta Generous, MD Referring Physician Obstetrics and Gynecology  06/12/19   Fabienne Holter, MD Consulting Physician Allergy and Immunology  06/12/19   Specialists, [facility]  07/26/21   Radene Buffalo, DPM Consulting Physician Podiatry  05/10/23     Chief Complaint  Patient presents with   Elevated Calcium  Level     Subjective: Carol [name] is a 68 y.o. Pt presents for an OV to discuss incidental findings of elevated calcium  09/06/2023 labs collected by specialty team.  Calcium  levels 11.6, BUN/creatinine ratio 34, BUN 28, creatinine 0.2. Patient had a mildly elevated protein level on labs collected 07/17/2023 with a total protein of 8.7 (normal less than 8.1) Patient had elevated levels of IgA 06/01/2023. Protein electrophoresis 05/10/2023 resulted with an increase in beta-2 globulin, suggestive of acute inflammation. She has had elevated calcium  levels up to 10.5 in the past, that have returned to normal. We have been working her up for significant fatigue over the last few months and she was recently diagnosed with severe obstructive sleep apnea approximately 4 weeks ago, she is awaiting company to call her to the order machine.     07/26/2023    9:40 AM 02/13/2023    1:19 PM 09/02/2022    8:53 AM 08/02/2022    2:23 PM 07/13/2022    9:33 AM  Depression screen PHQ 2/9  Decreased Interest 0 1 0 0 0  Down, Depressed, Hopeless 0 0 0 0 0  PHQ - 2 Score 0 1 0 0 0  Altered sleeping 0 0 0    Tired, decreased energy 0 3 0    Change in appetite 0 0 0    Feeling bad or failure  about yourself  0 0 0    Trouble concentrating 0 0 0    Moving slowly or fidgety/restless 0 0 0    Suicidal thoughts 0 0 0    PHQ-9 Score 0 4 0    Difficult doing work/chores Not difficult at all Not difficult at all Not difficult at all      Allergies  Allergen Reactions   Ivp Dye [Iodinated Contrast Media] Hives and Shortness Of Breath    Did ok in OP setting with 13 hr Premedications    Latanoprost     Lisinopril Cough   Onion Rash    Rash and GI upset   Sulfa Antibiotics Hives   Social History   Social History Narrative   Marital status/children/pets: Married.   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   Safety:      -smoke alarm in the home:Yes     - wears seatbelt: Yes     - Feels safe in their relationships: Yes   Past Medical History:  Diagnosis Date   Angio-edema    Blood type O+    Chest discomfort    , Atypical   Chicken pox    COVID-19 05/09/2019   Diabetic acidosis, type II (HCC)    Diverticulitis - large vs small bowel vs both?    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  "Normal "performed by Dr. Marla Sills   COLONOSCOPY  04/03/2019   LAPAROTOMY N/A 11/05/2013   Procedure: EXPLORATORY LAPAROTOMY;  Surgeon: Diantha Fossa, MD;  Location: [facility] OR;  Service: General;  Laterality: N/A;   Lexiscan  2010   Patient reports normal Lexiscan completed around 2010 by Dr. Marla Sills.   TOTAL ABDOMINAL HYSTERECTOMY  2004   Family History  Problem Relation Age of Onset    Heart disease Mother    Heart attack Mother    Asthma Mother    Early death Mother    Miscarriages / Stillbirths Mother    Heart disease Father    Lung cancer Father    Heart attack Brother    Asthma Brother    Heart disease Brother    Kidney disease Brother    Diverticulitis Sister    Heart disease Brother    Heart attack Brother    Heart disease Brother    Heart attack Brother    Early death Maternal Grandmother    Tuberculosis Maternal Grandmother    Depression Maternal Grandfather    Heart disease Paternal Grandmother    Heart disease Sister    Colon cancer Neg Hx    Esophageal cancer Neg Hx    Rectal cancer Neg Hx    Stomach cancer Neg Hx    Allergies as of 09/15/2023       Reactions   Ivp Dye [iodinated Contrast Media] Hives, Shortness Of Breath   Did ok in OP setting with 13 hr Premedications    Latanoprost     Lisinopril Cough   Onion Rash   Rash and GI upset   Sulfa Antibiotics Hives        Medication List        Accurate as of September 15, 2023 12:56 PM. If you have any questions, ask your nurse or doctor.          STOP taking these medications    Tradjenta  5 MG Tabs tablet Generic drug: linagliptin  Stopped by: Napolean Backbone       TAKE these medications    aspirin  81 MG tablet Take 81 mg by mouth at bedtime.   candesartan -hydrochlorothiazide 16-12.5 MG tablet Commonly known as: ATACAND  HCT Take 1 tablet by mouth daily.   cetirizine 10 MG tablet Commonly known as: ZYRTEC Take 10 mg by mouth at bedtime.   ciprofloxacin  500 MG tablet Commonly known as: CIPRO  Take 1 tablet (500 mg total) by mouth 2 (two) times daily.   citalopram  20 MG tablet Commonly known as: CELEXA  Take 1 tablet (20 mg total) by mouth daily.   empagliflozin  25 MG Tabs tablet Commonly known as: Jardiance  Take 1 tablet (25 mg total) by mouth daily.   Ferrex 150 150 MG capsule Generic drug: iron  polysaccharides Take 1 capsule (150 mg total) by mouth daily.    fluconazole  150 MG tablet Commonly known as: Diflucan  Take 1 tablet (150 mg total) by mouth daily. Repeat in 1  week if needed   fluocinonide  ointment 0.05 % Commonly known as: LIDEX  Apply 1 Application topically 2 (two) times daily.   fluticasone  50 MCG/ACT nasal spray Commonly known as: FLONASE  Place 2 sprays into both nostrils daily.   HYDROcodone -acetaminophen  5-325 MG tablet Commonly known as: NORCO/VICODIN Take 1 tablet by mouth every 6 (six) hours as needed.   hydrocortisone  cream 1 % Apply to affected area 3 times daily   metFORMIN  750 MG 24 hr tablet Commonly known as: GLUCOPHAGE -XR Take 1 tablet (750 mg total) by mouth daily with breakfast.   metoprolol  succinate 25 MG 24 hr tablet Commonly known as: TOPROL -XL Take 1 tablet (25 mg total) by mouth daily. Take with or immediately following a meal. Needs OV for refills   omeprazole  20 MG capsule Commonly known as: PRILOSEC Take 1 capsule (20 mg total) by mouth daily.   predniSONE  50 MG tablet Commonly known as: DELTASONE  Please take a directed   rosuvastatin  40 MG tablet Commonly known as: CRESTOR  Take 1 tablet (40 mg total) by mouth at bedtime.   timolol  0.25 % ophthalmic solution Commonly known as: TIMOPTIC  Place 1 drop into the left eye 2 (two) times daily.   Travoprost (BAK Free) 0.004 % Soln ophthalmic solution Commonly known as: TRAVATAN Place 1 drop into both eyes at bedtime.   vitamin C 1000 MG tablet Take 1,000 mg by mouth daily.        All past medical history, surgical history, allergies, family history, immunizations andmedications were updated in the EMR today and reviewed under the history and medication portions of their EMR.     ROS Negative, with the exception of above mentioned in HPI   Objective:  BP 124/78   Pulse 72   Temp 98.3 F (36.8 C)   Wt 191 lb 6.4 oz (86.8 kg)   SpO2 97%   BMI 33.90 kg/m  Body mass index is 33.9 kg/m. Physical Exam Vitals and nursing note  reviewed.  Constitutional:      General: She is not in acute distress.    Appearance: Normal appearance. She is normal weight. She is not ill-appearing or toxic-appearing.  HENT:     Head: Normocephalic and atraumatic.  Eyes:     General: No scleral icterus.       Right eye: No discharge.        Left eye: No discharge.     Extraocular Movements: Extraocular movements intact.     Conjunctiva/sclera: Conjunctivae normal.     Pupils: Pupils are equal, round, and reactive to light.  Neck:     Comments: No neck masses.  No thyromegaly.  No nodules palpated Cardiovascular:     Rate and Rhythm: Normal rate and regular rhythm.  Abdominal:     General: Abdomen is flat.     Palpations: Abdomen is soft.     Tenderness: There is no abdominal tenderness.  Musculoskeletal:     Cervical back: Neck supple. No tenderness.     Right lower leg: No edema.     Left lower leg: No edema.  Lymphadenopathy:     Cervical: No cervical adenopathy.  Skin:    Findings: No rash.  Neurological:     Mental Status: She is alert and oriented to person, place, and time. Mental status is at baseline.     Motor: No weakness.     Coordination: Coordination normal.     Gait: Gait normal.  Psychiatric:        Mood and Affect:  Mood normal.        Behavior: Behavior normal.        Thought Content: Thought content normal.        Judgment: Judgment normal.      No results found. No results found. No results found for this or any previous visit (from the past 24 hours).  Assessment/Plan: Trey D Gildersleeve is a 68 y.o. female present for OV for  Hypercalcemia (Primary) She is not taking any added calcium .  She reports she has been hydrating well.  We discussed differential causes of hypercalcemia.  Will retest levels today along with PTH/calcium /vitamin D  and full CMP to reevaluate albumin levels - Comp Met (CMET) - PTH, Intact and Calcium  - Vitamin D  (25 hydroxy) -Consider sarcoid workup.  Vaginal itching We  discussed elevated calcium  can cause bladder irritation and may be partially responsible for her symptoms.  Will rule out UTI and yeast/BV as potential causes and treat appropriately once lab results received - Cervicovaginal ancillary only( [facility]) - Urinalysis w microscopic + reflex cultur  Abnormal SPEP If calcium  levels still elevated along with abnormal protein levels would consider repeating SPEP and a UPEP.  Reviewed expectations re: course of current medical issues. Discussed self-management of symptoms. Outlined signs and symptoms indicating need for more acute intervention. Patient verbalized understanding and all questions were answered. Patient received an After-Visit Summary.    Orders Placed This Encounter  Procedures   Comp Met (CMET)   PTH, Intact and Calcium    Vitamin D  (25 hydroxy)   Urinalysis w microscopic + reflex cultur   No orders of the defined types were placed in this encounter.  Referral Orders  No referral(s) requested today     Note is dictated utilizing voice recognition software. Although note has been proof read prior to signing, occasional typographical errors still can be missed. If any questions arise, please do not hesitate to call for verification.   electronically signed by:  Napolean Backbone, DO  [facility] - OR

## 2023-09-16 LAB — PTH, INTACT AND CALCIUM
Calcium: 10.4 mg/dL (ref 8.6–10.4)
PTH: 31 pg/mL (ref 16–77)

## 2023-09-18 ENCOUNTER — Ambulatory Visit: Payer: Self-pay | Admitting: Family Medicine

## 2023-09-18 ENCOUNTER — Telehealth: Payer: Self-pay | Admitting: *Deleted

## 2023-09-18 LAB — CERVICOVAGINAL ANCILLARY ONLY
Bacterial Vaginitis (gardnerella): NEGATIVE
Chlamydia: NEGATIVE
Comment: NEGATIVE
Comment: NEGATIVE
Comment: NORMAL
Neisseria Gonorrhea: NEGATIVE

## 2023-09-18 MED ORDER — FLUCONAZOLE 150 MG PO TABS: 150.0000 mg | ORAL_TABLET | Freq: Once | ORAL | 0 refills | Status: AC

## 2023-09-18 NOTE — Telephone Encounter (Signed)
 Copied from CRM [id number]. Topic: Clinical - Lab/Test Results >> Sep 15, 2023 10:03 AM Justina Oman C wrote: Reason for CRM: Patient [phone] wants sleep test results, had the test done in March and was advised [facility] was to contact patient. Patient states has not heard from anyone and it's been about a month. Please call back.    I called and spoke with the pt She still has not received call from DME regarding CPAP start  Referral was made 08/18/23 Routing to [facility]

## 2023-09-19 ENCOUNTER — Ambulatory Visit (HOSPITAL_COMMUNITY): Admission: RE | Admit: 2023-09-19 | Source: Ambulatory Visit

## 2023-09-19 LAB — URINALYSIS W MICROSCOPIC + REFLEX CULTURE
Bacteria, UA: NONE SEEN /HPF
Bilirubin Urine: NEGATIVE
Hgb urine dipstick: NEGATIVE
Hyaline Cast: NONE SEEN /LPF
Ketones, ur: NEGATIVE
Nitrites, Initial: NEGATIVE
Protein, ur: NEGATIVE
RBC / HPF: NONE SEEN /HPF (ref 0–2)
Specific Gravity, Urine: 1.009 (ref 1.001–1.035)
Squamous Epithelial / HPF: NONE SEEN /HPF
pH: 5.5 (ref 5.0–8.0)

## 2023-09-19 LAB — URINE CULTURE
MICRO NUMBER:: 16553382
SPECIMEN QUALITY:: ADEQUATE

## 2023-09-19 LAB — CULTURE INDICATED

## 2023-09-19 MED ORDER — TERCONAZOLE 0.4 % VA CREA: 1.0000 | TOPICAL_CREAM | Freq: Every day | VAGINAL | 1 refills | Status: DC

## 2023-09-19 NOTE — Telephone Encounter (Signed)
 Please call pt  Her urine culture now came back with a yeast infection.  The type of yeast is called Candida krusei.  This particular type of yeast is resistant to Diflucan /fluconazole  prescribed for normal yeast infections.   I have called in a cream called Terazol.  1 applicator filled of cream before bed for 7 days will treat her infection.  - If she continues to get the yeast infections, we may need to consider switching out the Jardiance  prescription to something else.  Jardiance  can cause increased UTIs and yeast infections.

## 2023-09-20 ENCOUNTER — Ambulatory Visit: Payer: Self-pay

## 2023-09-20 MED ORDER — ITRACONAZOLE 100 MG PO CAPS: 200.0000 mg | ORAL_CAPSULE | Freq: Two times a day (BID) | ORAL | 0 refills | Status: DC

## 2023-09-20 MED ORDER — ITRACONAZOLE 100 MG PO CAPS
200.0000 mg | ORAL_CAPSULE | Freq: Two times a day (BID) | ORAL | 0 refills | Status: AC
Start: 2023-09-20 — End: 2023-09-27

## 2023-09-20 NOTE — Telephone Encounter (Signed)
 FYI Only or Action Required?: Action required by provider  Patient was last seen in primary care on 09/15/2023 by Marylee Snowball, Renee A, DO. Called Nurse Triage reporting Medication Reaction, Pruritis, and Urticaria. Symptoms began today. Interventions attempted: Nothing. Symptoms are: stable.  Triage Disposition: Home Care  Patient/caregiver understands and will follow disposition?: Yes                             Copied from CRM [id number]. Topic: Clinical - Red Word Triage >> Sep 20, 2023  9:54 AM Howard Macho wrote: Red Word that prompted transfer to Nurse Triage: patient called stating the doctor called in a terconazole cream and now she is breaking out on her chest and itching from her chest up Reason for Disposition  Widespread hives  Answer Assessment - Initial Assessment Questions 1. APPEARANCE: What does the rash look like?      Splotchy red 2. LOCATION: Where is the rash located?      Arms, face and head  5. ONSET: When did the hives begin? (Hours or days ago)      This morning, first applied terconazole cream last night 6. ITCHING: Does it itch? If Yes, ask: How bad is the itch?    - MILD: doesn't interfere with normal activities   - MODERATE-SEVERE: interferes with work, school, sleep, or other activities      Moderate- states she is constantly starting to scratch 7. RECURRENT PROBLEM: Have you had hives before? If Yes, ask: When was the last time? and What happened that time?      Yes, states she reacted this way before to a sulfa drug 8. TRIGGERS: Were you exposed to any new food, plant, cosmetic product or animal just before the hives began?     Applied terconazole  9. OTHER SYMPTOMS: Do you have any other symptoms? (e.g., fever, tongue swelling, difficulty breathing, abdomen pain)     Denies difficulty breathing, denies tongue/airway swelling, denies fever    Patient applied terconazole cream last night and woke up with  hives/itching this morning. Advised patient to take an antihistamine and monitor symptoms. Advised patient to call back if symptoms persist or worsen 24 hours after taking antihistamine. Advised patient to monitor for difficulty breathing and tongue/airway swelling. Patient verbalized understanding. Please advise on alternative medication to terconazole.  Protocols used: Hives-A-AH

## 2023-09-20 NOTE — Telephone Encounter (Signed)
 Rx faxed

## 2023-09-20 NOTE — Addendum Note (Signed)
 Addended by: Margaretta Shaw A on: 09/20/2023 04:08 PM   Modules accepted: Orders

## 2023-09-20 NOTE — Telephone Encounter (Addendum)
 Please fax in or phone in script. Due to the type of message sent, the script will not go to pharmacy.  Itraconazole 2 tabs, twice a day for 7 days

## 2023-09-20 NOTE — Telephone Encounter (Signed)
 I received a message from [place] with [employer] [facility], Dariel Edelson   Order was recieved and sent to intake on 08-22-2023 1156am. Its still pending, not sure why. I re-pulled the docs and sent back in STAT / Urgent 09-20-2023 1020am. I received email conf, the items went to intake for review.

## 2023-09-20 NOTE — Telephone Encounter (Signed)
 I have sent urgent message to [employer] to check on this issue but the patient has [facility] and I think the order was sent to [facility]

## 2023-09-20 NOTE — Addendum Note (Signed)
 Addended by: Napolean Backbone A on: 09/20/2023 03:42 PM   Modules accepted: Orders

## 2023-09-21 ENCOUNTER — Telehealth: Payer: Self-pay

## 2023-09-21 ENCOUNTER — Other Ambulatory Visit (HOSPITAL_COMMUNITY): Payer: Self-pay

## 2023-09-21 LAB — VITAMIN D 25 HYDROXY (VIT D DEFICIENCY, FRACTURES): VITD: 71.93 ng/mL (ref 30.00–100.00)

## 2023-09-21 NOTE — Telephone Encounter (Signed)
 Pharmacy Patient Advocate Encounter   Received notification from Onbase that prior authorization for Itraconazole 100MG  capsules  is required/requested.   Insurance verification completed.   The patient is insured through [facility] .   Per test claim: PA required; PA started via CoverMyMeds. KEY [id number] . Waiting for clinical questions to populate.

## 2023-09-22 NOTE — Telephone Encounter (Signed)
 PLEASE BE ADVISED Clinical questions have been answered and PA submitted.TO PLAN. PA currently Pending.

## 2023-09-22 NOTE — Telephone Encounter (Signed)
 Noted

## 2023-09-25 NOTE — Telephone Encounter (Signed)
 Pharmacy Patient Advocate Encounter  Received notification from [employer] that Prior Authorization for Itraconazole  100MG  capsules has been DENIED.  See denial reason below. No denial letter attached in CMM. Will attach denial letter to Media tab once received. DENIAL NOTES ITRACONAZOLE  CAP 100MG  is not FDA approved for your medical condition(s): recurrent urinary tract infection. These condition(s) are not supported by one of the accepted references. Therefore your drug is denied because it is not being used for a medically accepted indication.  PA #/Case ID/Reference #: [id number]

## 2023-09-26 ENCOUNTER — Telehealth (HOSPITAL_COMMUNITY): Payer: Self-pay | Admitting: Emergency Medicine

## 2023-09-26 ENCOUNTER — Encounter: Payer: Self-pay | Admitting: Gastroenterology

## 2023-09-26 ENCOUNTER — Ambulatory Visit: Admitting: Gastroenterology

## 2023-09-26 VITALS — BP 104/60 | HR 50 | Ht 63.0 in | Wt 192.0 lb

## 2023-09-26 DIAGNOSIS — Z8719 Personal history of other diseases of the digestive system: Secondary | ICD-10-CM

## 2023-09-26 DIAGNOSIS — R194 Change in bowel habit: Secondary | ICD-10-CM

## 2023-09-26 DIAGNOSIS — R1032 Left lower quadrant pain: Secondary | ICD-10-CM | POA: Diagnosis not present

## 2023-09-26 DIAGNOSIS — K5720 Diverticulitis of large intestine with perforation and abscess without bleeding: Secondary | ICD-10-CM

## 2023-09-26 HISTORY — DX: Left lower quadrant pain: R10.32

## 2023-09-26 MED ORDER — NA SULFATE-K SULFATE-MG SULF 17.5-3.13-1.6 GM/177ML PO SOLN: 1.0000 | Freq: Once | ORAL | 0 refills | Status: AC

## 2023-09-26 MED ORDER — ONDANSETRON HCL 4 MG PO TABS: ORAL_TABLET | ORAL | 0 refills | Status: AC

## 2023-09-26 NOTE — Telephone Encounter (Signed)
 It is not being prescribed for recurrent UTI.  Has been prescribed a Candida infection of the bladder and vaginal area.  This Candida is resistant to fluconazole .  Patient had a allergic reaction to terconazole .   Also, tell pt to check out of pocket price - with good rx coupon it looks to be around $35

## 2023-09-26 NOTE — Patient Instructions (Signed)
 You have been scheduled for a colonoscopy. Please follow written instructions given to you at your visit today.   If you use inhalers (even only as needed), please bring them with you on the day of your procedure.  DO NOT TAKE 7 DAYS PRIOR TO TEST- Trulicity (dulaglutide) Ozempic, Wegovy (semaglutide) Mounjaro (tirzepatide) Bydureon Bcise (exanatide extended release)  DO NOT TAKE 1 DAY PRIOR TO YOUR TEST Rybelsus (semaglutide) Adlyxin (lixisenatide) Victoza (liraglutide) Byetta (exanatide) ______________________________________________________________________  _______________________________________________________  If your blood pressure at your visit was 140/90 or greater, please contact your primary care physician to follow up on this.  _______________________________________________________  If you are age 69 or older, your body mass index should be between 23-30. Your Body mass index is 34.01 kg/m. If this is out of the aforementioned range listed, please consider follow up with your Primary Care Provider.  If you are age 90 or younger, your body mass index should be between 19-25. Your Body mass index is 34.01 kg/m. If this is out of the aformentioned range listed, please consider follow up with your Primary Care Provider.   ________________________________________________________  The [facility] providers would like to encourage you to use MYCHART to communicate with providers for non-urgent requests or questions.  Due to long hold times on the telephone, sending your provider a message by [facility] may be a faster and more efficient way to get a response.  Please allow 48 business hours for a response.  Please remember that this is for non-urgent requests.  _______________________________________________________

## 2023-09-26 NOTE — Telephone Encounter (Signed)
 Attempted to call patient regarding upcoming cardiac CT appointment. Left message on voicemail with name and callback number Rockwell Alexandria RN Navigator Cardiac [facility] [phone] Office [phone] Cell

## 2023-09-26 NOTE — Progress Notes (Addendum)
 09/26/2023 TINLEY ROUGHT [id number] 1955-09-09   HISTORY OF PRESENT ILLNESS: This is Carol [name] 68 year old female who h is Carol [name] patient of Dr. Jadene Maxwell.  She has past medical history of hypertension, diabetes type 2, GERD, diverticulitis.  She had an episode in 2015 requiring surgery with small bowel resection and appendectomy (? Small bowel diverticulitis).  Recently had and episode of diverticulitis seen on CT scan in April.  She was treated with Carol [name] week of Augmentin  and then 2 weeks of Cipro , 1 of those weeks with Flagyl  as well.  Still continues with left lower quadrant abdominal pain.  She reports severe left lower quadrant abdominal pain prior to needing to have Carol [name] bowel movement, but resolves or significantly improves after having Carol [name] bowel movement.  Some altered bowel habits with some intermittent issues with diarrhea with urgency, not able to tolerate certain foods like green vegetables (except green beans and peas).  Reports that she has some Bentyl /dicyclomine  at home, but has not used it recently.  CT scan abdomen and pelvis with contrast on 07/15/2023: IMPRESSION: Focal area of wall thickening and stranding along the sigmoid colon with diverticula consistent with an area of diverticulitis. No complex features at this time. Recommend follow up after treatment to confirm resolution and exclude secondary pathology.   Gallstones.    CT scan abdomen and pelvis with contrast on 07/17/2023: IMPRESSION: 1. Findings consistent with acute sigmoid colon diverticulitis. No definite perforation or abscess. 2. Gallstones. 3. Aortic atherosclerosis.  Colonoscopy 03/2019:  Multiple diverticula were found in the sigmoid colon. There was narrowing of the colon in association with the diverticular opening.   Past Medical History:  Diagnosis Date   Angio-edema    Blood type O+    Chest discomfort    , Atypical   Chicken pox    COVID-19 05/09/2019   Diabetic acidosis, type II (HCC)     Diverticulitis - large vs small bowel vs both?    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  Normal performed by Dr. Marla Sills   COLONOSCOPY  04/03/2019   LAPAROTOMY N/Carol [name] 11/05/2013   Procedure: EXPLORATORY LAPAROTOMY;  Surgeon: Diantha Fossa, MD;  Location: [facility] OR;  Service: General;  Laterality: N/Carol [name];   Lexiscan  2010   Patient reports normal Lexiscan completed around 2010 by Dr. Marla Sills.   TOTAL ABDOMINAL HYSTERECTOMY  2004    reports that she has never smoked. She has never been exposed to tobacco smoke. She has never used smokeless tobacco. She reports that she does not currently use alcohol. She reports that she does not use drugs. family history includes Asthma in her brother and mother; Depression in her maternal grandfather; Diverticulitis in her sister; Early death in her maternal grandmother and mother; Heart attack in her brother, brother, brother, and mother; Heart disease in her brother, brother, brother, father, mother, paternal grandmother, and sister; Kidney disease in her brother; Lung cancer in her father; Miscarriages / Stillbirths in her mother; Tuberculosis in her maternal  grandmother. Allergies  Allergen Reactions   Ivp Dye [Iodinated Contrast Media] Hives and Shortness Of Breath    Did ok in OP setting with 13 hr Premedications    Latanoprost     Lisinopril Cough   Onion Rash    Rash and GI upset   Sulfa Antibiotics Hives      Outpatient Encounter Medications as of 09/26/2023  Medication Sig   Ascorbic  Acid (VITAMIN C) 1000 MG tablet Take 1,000 mg by mouth daily.   aspirin  81 MG tablet Take 81 mg by mouth at bedtime.    candesartan -hydrochlorothiazide (ATACAND  HCT) 16-12.5 MG tablet Take 1 tablet by mouth daily.   cetirizine (ZYRTEC) 10 MG tablet Take 10 mg by mouth at bedtime.    ciprofloxacin  (CIPRO ) 500 MG tablet Take 1 tablet (500 mg total) by mouth 2 (two) times daily.   citalopram  (CELEXA ) 20 MG tablet Take 1 tablet (20 mg total) by mouth daily.   empagliflozin  (JARDIANCE ) 25 MG TABS tablet Take 1 tablet (25 mg total) by mouth daily.   FERREX 150 150 MG capsule Take 1 capsule (150 mg total) by mouth daily.   fluocinonide  ointment (LIDEX ) 0.05 % Apply 1 Application topically 2 (two) times daily.   fluticasone  (FLONASE ) 50 MCG/ACT nasal spray Place 2 sprays into both nostrils daily.   HYDROcodone -acetaminophen  (NORCO/VICODIN) 5-325 MG tablet Take 1 tablet by mouth every 6 (six) hours as needed.   hydrocortisone  cream 1 % Apply to affected area 3 times daily   itraconazole  (SPORANOX ) 100 MG capsule Take 2 capsules (200 mg total) by mouth 2 (two) times daily for 7 days.   metFORMIN  (GLUCOPHAGE -XR) 750 MG 24 hr tablet Take 1 tablet (750 mg total) by mouth daily with breakfast.   metoprolol  succinate (TOPROL -XL) 25 MG 24 hr tablet Take 1 tablet (25 mg total) by mouth daily. Take with or immediately following Carol [name] meal. Needs OV for refills   omeprazole  (PRILOSEC) 20 MG capsule Take 1 capsule (20 mg total) by mouth daily.   predniSONE  (DELTASONE ) 50 MG tablet Please take Carol [name] directed   rosuvastatin  (CRESTOR ) 40 MG tablet Take 1 tablet (40 mg total) by mouth at bedtime.   timolol  (TIMOPTIC ) 0.25 % ophthalmic solution Place 1 drop into the left eye 2 (two) times daily.   Travoprost, BAK Free, (TRAVATAN) 0.004 % SOLN ophthalmic solution Place 1 drop into both eyes at bedtime.   No facility-administered encounter medications on file as of 09/26/2023.    REVIEW OF SYSTEMS  : All other systems reviewed  and negative except where noted in the History of Present Illness.   PHYSICAL EXAM: BP 104/60   Pulse (!) 50   Ht 5' 3 (1.6 m)   Wt 192 lb (87.1 kg)   BMI 34.01 kg/m  General: Well developed white female in no acute distress Head: Normocephalic and atraumatic Eyes:  Sclerae anicteric, conjunctiva pink. Ears: Normal auditory acuity Lungs: Clear throughout to auscultation; no W/R/R. Heart: Regular rate and rhythm; no M/R/G. Abdomen: Soft, non-distended.  BS present.  Mild LLQ TTP. Rectal:  Will be done at the time of colonoscopy. Musculoskeletal: Symmetrical with no gross deformities  Skin: No lesions on visible extremities Extremities: No edema  Neurological: Alert oriented x 4, grossly non-focal Psychological:  Alert and cooperative. Normal mood and affect  ASSESSMENT AND PLAN: *Left lower quadrant abdominal pain: Had an episode of diverticulitis seen on CT scan in April.  She was treated with Carol [name] week of Augmentin  and then 2 weeks of  Cipro , 1 of those weeks with Flagyl  as well.  Still continues with left lower quadrant abdominal pain.  Colonoscopy 5 years ago showed multiple diverticula in the sigmoid colon with associated narrowing.  She reports some altered bowel habits with some intermittent issues with diarrhea, not able to tolerate certain foods.  That is likely due to this area of narrowing and probably needs to keep her stools on the softer side.  May have some spasm in that area as well.  She says that she has dicyclomine  at home and can use that on an as-needed basis.  One of the CT scans recommended follow-up after treatment to confirm resolution and exclude secondary pathology.  Family concerned because of her history.  -Will schedule colonoscopy with Dr. Willy Harvest.  The risks, benefits, and alternatives to colonoscopy were discussed with the patient and she consents to proceed.  -Dicyclomine  that she has at home prn.  CC:  Carol Carol [name], Carol A, DO

## 2023-09-27 ENCOUNTER — Ambulatory Visit (HOSPITAL_COMMUNITY)
Admission: RE | Admit: 2023-09-27 | Discharge: 2023-09-27 | Disposition: A | Discharge status: Home / Self Care | Source: Ambulatory Visit | Attending: Cardiology | Admitting: Cardiology

## 2023-09-27 ENCOUNTER — Other Ambulatory Visit: Payer: Self-pay | Admitting: Internal Medicine

## 2023-09-27 ENCOUNTER — Ambulatory Visit (HOSPITAL_COMMUNITY)

## 2023-09-27 ENCOUNTER — Ambulatory Visit (HOSPITAL_COMMUNITY)
Admission: RE | Admit: 2023-09-27 | Discharge: 2023-09-27 | Disposition: A | Discharge status: Home / Self Care | Source: Ambulatory Visit | Attending: Internal Medicine | Admitting: Internal Medicine

## 2023-09-27 DIAGNOSIS — R931 Abnormal findings on diagnostic imaging of heart and coronary circulation: Secondary | ICD-10-CM | POA: Insufficient documentation

## 2023-09-27 DIAGNOSIS — R0602 Shortness of breath: Secondary | ICD-10-CM | POA: Insufficient documentation

## 2023-09-27 DIAGNOSIS — I2510 Atherosclerotic heart disease of native coronary artery without angina pectoris: Secondary | ICD-10-CM | POA: Diagnosis not present

## 2023-09-27 DIAGNOSIS — R072 Precordial pain: Secondary | ICD-10-CM

## 2023-09-27 MED ORDER — NITROGLYCERIN 0.4 MG SL SUBL
0.8000 mg | SUBLINGUAL_TABLET | Freq: Once | SUBLINGUAL | Status: AC
  Administered 2023-09-27: 0.8 mg via SUBLINGUAL

## 2023-09-27 MED ORDER — IOHEXOL 350 MG/ML SOLN
100.0000 mL | Freq: Once | INTRAVENOUS | Status: AC | PRN
  Administered 2023-09-27: 100 mL via INTRAVENOUS

## 2023-09-30 ENCOUNTER — Ambulatory Visit: Payer: Self-pay | Admitting: Cardiology

## 2023-10-02 ENCOUNTER — Other Ambulatory Visit: Payer: Self-pay | Admitting: Family Medicine

## 2023-10-09 NOTE — Progress Notes (Signed)
 Office Visit Note  Patient: Carol [name]             Date of Birth: [birth year]           MRN: [id number]             PCP: Catherine Charlies LABOR, DO Referring: Catherine Charlies LABOR, DO Visit Date: 10/18/2023 Occupation: @GUAROCC @  Subjective:  Pain in multiple joints   History of Present Illness: Carol [name] is a 68 y.o. female for the evaluation of fatigue and arthralgias.  According the patient her symptoms started more than 5 years ago with pain in her hands, knee joints in her feet.  She states she used to work as a Interior and spatial designer and always had discomfort in her elbows and her lower back.  She states the lower back pain radiates sometimes to her left lower extremity.  She had seen an orthopedic surgeon in [place] was given her CMC injections to bilateral CMC joints x 2 in the last year.  She also gives history of plantar fasciitis for which she is seeing a podiatrist.  She gives history of swelling in her hands, left knee joint and her feet.  She is also noticed a rash on her left leg.  She states her hands stay dry.  She has not seen a dermatologist. 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.  She enjoys sewing, reading and traveling.  She drinks alcohol only occasionally.  She has never been a smoker.  She was accompanied by her Sister Marval to the office today.  Her niece Rosina was on the phone during the visit.  There is no family history of autoimmune disease except for psoriasis in her nephew.    Activities of Daily Living:  Patient reports morning stiffness for 2-3 hours.   Patient Reports nocturnal pain.  Difficulty dressing/grooming: Denies Difficulty climbing stairs: Denies Difficulty getting out of chair: Denies Difficulty using hands for taps, buttons, cutlery, and/or writing: Denies  Review of Systems  Constitutional:  Positive for fatigue.  HENT:  Negative for mouth sores and mouth dryness.   Eyes:  Negative  for dryness.  Respiratory:  Negative for shortness of breath.   Cardiovascular:  Positive for palpitations. Negative for chest pain.  Gastrointestinal:  Positive for constipation and diarrhea. Negative for blood in stool.  Endocrine: Negative for increased urination.  Genitourinary:  Positive for involuntary urination.  Musculoskeletal:  Positive for joint pain, joint pain, joint swelling, myalgias, morning stiffness and myalgias. Negative for gait problem, muscle weakness and muscle tenderness.  Skin:  Positive for rash. Negative for color change, hair loss and sensitivity to sunlight.  Allergic/Immunologic: Positive for susceptible to infections.  Neurological:  Positive for headaches. Negative for dizziness.  Hematological:  Negative for swollen glands.  Psychiatric/Behavioral:  Negative for depressed mood and sleep disturbance. The patient is nervous/anxious.     PMFS History:  Patient Active Problem List   Diagnosis Date Noted   LLQ abdominal pain 09/26/2023   Altered bowel habits 09/26/2023   Severe obstructive sleep apnea 08/18/2023   Elevated coronary artery calcium  score 133; 72nd percentile 06/26/2023   ANA positive 05/15/2023   Abnormal SPEP 05/15/2023   B12 deficiency 01/13/2023   Irritable bowel syndrome with diarrhea 09/02/2022   Full incontinence of feces 09/02/2022   Recurrent UTI 01/10/2022   Obesity (BMI 30-39.9) 03/25/2021   Hypercalcemia 12/08/2020   Iron  deficiency anemia 12/08/2020   Hot flashes  due to menopause 11/06/2019   Morbid obesity (HCC) 02/11/2019   Type 2 diabetes mellitus with hyperlipidemia (HCC) 02/11/2019   Diverticulitis large intestine 10/31/2013   Systolic dysfunction    Gastroesophageal reflux disease    Hypertension     Past Medical History:  Diagnosis Date   Angio-edema    Blood type O+    Chest discomfort    , Atypical   Chicken pox    COVID-19 05/09/2019   Diabetic acidosis, type II (HCC)    Diverticulitis - large vs small  bowel vs both?    Gastroesophageal reflux disease    Glaucoma    Hyperlipidemia    Hypertension    Plantar fasciitis, bilateral 2015   PVC (premature ventricular contraction)    Sleep apnea 07/2023   Systolic dysfunction    , Hyperdynamic    Family History  Problem Relation Age of Onset   Heart disease Mother    Heart attack Mother    Asthma Mother    Early death Mother    Miscarriages / Stillbirths Mother    Emphysema Mother    Heart disease Father    Lung cancer Father    Diverticulitis Sister    Heart disease Sister    Heart attack Brother    Asthma Brother    Heart disease Brother    Kidney disease Brother    Heart disease Brother    Heart attack Brother    Heart disease Brother    Heart attack Brother    Meniere's disease Brother    Early death Maternal Grandmother    Tuberculosis Maternal Grandmother    Depression Maternal Grandfather    Heart disease Paternal Grandmother    Colon cancer Neg Hx    Esophageal cancer Neg Hx    Rectal cancer Neg Hx    Stomach cancer Neg Hx    Past Surgical History:  Procedure Laterality Date   APPENDECTOMY N/A 11/05/2013   Procedure: APPENDECTOMY;  Surgeon: Lynwood MALVA Pina, MD;  Location: [facility] OR;  Service: General;  Laterality: N/A;   BOWEL RESECTION N/A 11/05/2013   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  Normal performed by Dr. Annis   COLONOSCOPY  04/03/2019   LAPAROTOMY N/A 11/05/2013   Procedure: EXPLORATORY LAPAROTOMY;  Surgeon: Lynwood MALVA Pina, MD;  Location: [facility] OR;  Service: General;  Laterality: N/A;   Lexiscan  2010   Patient reports normal Lexiscan completed around 2010 by Dr. Annis.   TOTAL ABDOMINAL HYSTERECTOMY  2004   Social History   Social History Narrative   Marital status/children/pets: Married.   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   Safety:      -smoke alarm in the home:Yes     -  wears seatbelt: Yes     - Feels safe in their relationships: Yes   Immunization History  Administered Date(s) Administered   Fluad Quad(high Dose 65+) 02/21/2020, 01/06/2021, 03/02/2022, 12/22/2022   Influenza,inj,Quad PF,6+ Mos 03/30/2019   PFIZER(Purple Top)SARS-COV-2 Vaccination 07/02/2019, 07/30/2019, 01/19/2020   Pfizer Covid-19 Vaccine Bivalent Booster 23yrs & up 02/12/2021   Pneumococcal Conjugate-13 03/11/2020   Pneumococcal Polysaccharide-23 03/24/2021   Tdap 08/27/2012, 12/22/2022   Zoster Recombinant(Shingrix) 11/05/2019, 02/03/2020     Objective: Vital Signs: BP 110/71 (BP Location: Right Arm, Patient Position: Sitting, Cuff Size: Normal)   Pulse (!) 53   Resp 16   Ht 5' 4 (1.626 m)  Wt 194 lb (88 kg)   BMI 33.30 kg/m    Physical Exam Vitals and nursing note reviewed.  Constitutional:      Appearance: She is well-developed.  HENT:     Head: Normocephalic and atraumatic.  Eyes:     Conjunctiva/sclera: Conjunctivae normal.  Cardiovascular:     Rate and Rhythm: Normal rate and regular rhythm.     Heart sounds: Normal heart sounds.  Pulmonary:     Effort: Pulmonary effort is normal.     Breath sounds: Normal breath sounds.  Abdominal:     General: Bowel sounds are normal.     Palpations: Abdomen is soft.  Musculoskeletal:     Cervical back: Normal range of motion.  Lymphadenopathy:     Cervical: No cervical adenopathy.  Skin:    General: Skin is warm and dry.     Capillary Refill: Capillary refill takes less than 2 seconds.     Comments: Dry skin was noted on her bilateral index fingers.  A circular was erythematous scaly patch was noted over the left calf.  Neurological:     Mental Status: She is alert and oriented to person, place, and time.  Psychiatric:        Behavior: Behavior normal.      Musculoskeletal Exam: Cervical, thoracic and lumbar spine were in good range of motion.  She had tenderness over SI joints.  Shoulders, elbows, wrist joints,  MCPs were in good range of motion with no synovitis.  Bilateral CMC PIP and DIP thickening was noted.  No nail pitting or nail dystrophy was noted.  Hip joints and knee joints in good range of motion without any warmth swelling or effusion.  There was no tenderness over her ankles or MTPs.  No plantar fasciitis or Achilles tendinitis was noted.  CDAI Exam: CDAI Score: -- Patient Global: --; Provider Global: -- Swollen: --; Tender: -- Joint Exam 10/18/2023   No joint exam has been documented for this visit   There is currently no information documented on the homunculus. Go to the Rheumatology activity and complete the homunculus joint exam.  Investigation: No additional findings.  Imaging: XR KNEE 3 VIEW LEFT Result Date: 10/18/2023 No medial or lateral compartment narrowing was noted.  Moderate patellofemoral narrowing was noted. Impression: These findings suggestive of moderate chondromalacia patella of the knee.  XR Pelvis 1-2 Views Result Date: 10/18/2023 No SI joint narrowing or sclerosis was noted.  Some spurring was noted. Impression: Unremarkable x-rays of the SI joint.  XR Foot 2 Views Left Result Date: 10/18/2023 First MTP, PIP and DIP narrowing was noted.  Dorsal spurring was noted.  No tibiotalar or subtalar joint space narrowing was noted.  Posterior calcaneal spur was noted.  No erosive changes were noted. Impression: These findings suggestive of osteoarthritis of the foot.  XR Foot 2 Views Right Result Date: 10/18/2023 First MTP, PIP and DIP narrowing was noted.  Dorsal spurring was noted.  No tibiotalar or subtalar joint space narrowing was noted.  Inferior and posterior calcaneal spurs were noted. Impression: These findings are suggestive of osteoarthritis of the foot.  XR Hand 2 View Left Result Date: 10/18/2023 Severe CMC narrowing and subluxation was noted.  PIP and DIP narrowing was noted.  No MCP, intercarpal radiocarpal joint space narrowing was noted.  No erosive  changes were noted. Impression: These findings suggestive of osteoarthritis of the hand.  XR Hand 2 View Right Result Date: 10/18/2023 CMC, PIP and DIP narrowing was noted.  No  MCP, intercarpal or radiocarpal joint space narrowing was noted.  No erosive changes were noted. Impression: These findings are suggestive of osteoarthritis of the hand.  ECHOCARDIOGRAM COMPLETE Result Date: 10/17/2023    ECHOCARDIOGRAM REPORT   Patient Name:   Carol [name] Date of Exam: 10/17/2023 Medical Rec #:  [id number]     Height:       63.0 in Accession #:    [id number]    Weight:       192.0 lb Date of Birth:  October 31, 1955    BSA:          1.900 m Patient Age:    67 years      BP:           104/60 mmHg Patient Gender: F             HR:           57 bpm. Exam Location:  [address] Procedure: 2D Echo, Cardiac Doppler and Color Doppler (Both Spectral and Color            Flow Doppler were utilized during procedure). Indications:    R07.9 Chest pain  History:        Patient has prior history of Echocardiogram examinations, most                 recent 11/25/2019. Arrythmias:PVC; Risk Factors:Hypertension,                 Diabetes and Dyslipidemia. COVID-19.  Sonographer:    Carl Rodgers-Jones RDCS Referring Phys: [id number] MARK C SKAINS IMPRESSIONS  1. Left ventricular ejection fraction, by estimation, is 60 to 65%. The left ventricle has normal function. The left ventricle has no regional wall motion abnormalities. Left ventricular diastolic parameters were normal.  2. Right ventricular systolic function is normal. The right ventricular size is normal. Tricuspid regurgitation signal is inadequate for assessing PA pressure.  3. The mitral valve is degenerative. Trivial mitral valve regurgitation. No evidence of mitral stenosis.  4. The aortic valve is grossly normal. Unable to determine aortic valve morphology due to image quality. Aortic valve regurgitation is not visualized. Aortic valve sclerosis is present, with no evidence of aortic  valve stenosis.  5. The inferior vena cava is normal in size with greater than 50% respiratory variability, suggesting right atrial pressure of 3 mmHg. Comparison(s): A prior study was performed on 11/25/2019. No significant change from prior study. FINDINGS  Left Ventricle: Left ventricular ejection fraction, by estimation, is 60 to 65%. The left ventricle has normal function. The left ventricle has no regional wall motion abnormalities. The left ventricular internal cavity size was normal in size. There is  no left ventricular hypertrophy. Left ventricular diastolic parameters were normal. Right Ventricle: The right ventricular size is normal. No increase in right ventricular wall thickness. Right ventricular systolic function is normal. Tricuspid regurgitation signal is inadequate for assessing PA pressure. Left Atrium: Left atrial size was normal in size. Right Atrium: Right atrial size was normal in size. Pericardium: There is no evidence of pericardial effusion. Mitral Valve: The mitral valve is degenerative in appearance. There is mild thickening of the mitral valve leaflet(s). Normal mobility of the mitral valve leaflets. Mild mitral annular calcification. Trivial mitral valve regurgitation. No evidence of mitral valve stenosis. Tricuspid Valve: The tricuspid valve is normal in structure. Tricuspid valve regurgitation is not demonstrated. No evidence of tricuspid stenosis. The aortic valve is grossly normal. Aortic valve regurgitation is not visualized. Aortic valve sclerosis  is present, with no evidence of aortic valve stenosis. Pulmonic Valve: The pulmonic valve was not well visualized. Pulmonic valve regurgitation is not visualized. No evidence of pulmonic stenosis. Aorta: The aortic root and ascending aorta are structurally normal, with no evidence of dilitation. Venous: The inferior vena cava is normal in size with greater than 50% respiratory variability, suggesting right atrial pressure of 3 mmHg.  IAS/Shunts: The atrial septum is grossly normal.  LEFT VENTRICLE PLAX 2D LVIDd:         4.50 cm   Diastology LVIDs:         2.50 cm   LV e' medial:    8.87 cm/s LV PW:         0.80 cm   LV E/e' medial:  10.2 LV IVS:        0.50 cm   LV e' lateral:   9.68 cm/s LVOT diam:     1.80 cm   LV E/e' lateral: 9.4 LV SV:         70 LV SV Index:   37 LVOT Area:     2.54 cm  RIGHT VENTRICLE             IVC RV Basal diam:  4.00 cm     IVC diam: 1.30 cm RV S prime:     15.73 cm/s TAPSE (M-mode): 2.0 cm LEFT ATRIUM             Index        RIGHT ATRIUM           Index LA diam:        3.80 cm 2.00 cm/m   RA Area:     12.60 cm LA Vol (A2C):   51.6 ml 27.15 ml/m  RA Volume:   30.90 ml  16.26 ml/m LA Vol (A4C):   28.1 ml 14.79 ml/m LA Biplane Vol: 39.2 ml 20.63 ml/m  AORTIC VALVE AV Area (Vmax):    1.57 cm AV Area (Vmean):   1.55 cm AV Area (VTI):     1.64 cm AV Vmax:           203.25 cm/s AV Vmean:          129.500 cm/s AV VTI:            0.424 m AV Peak Grad:      16.5 mmHg AV Mean Grad:      8.0 mmHg LVOT Vmax:         125.00 cm/s LVOT Vmean:        78.750 cm/s LVOT VTI:          0.274 m LVOT/AV VTI ratio: 0.65  AORTA Ao Root diam: 2.90 cm Ao Asc diam:  3.30 cm MITRAL VALVE MV Area (PHT): 2.50 cm     SHUNTS MV Decel Time: 303 msec     Systemic VTI:  0.27 m MV E velocity: 90.60 cm/s   Systemic Diam: 1.80 cm MV A velocity: 116.00 cm/s MV E/A ratio:  0.78 Sunit Tolia Electronically signed by Madonna Large Signature Date/Time: 10/17/2023/2:56:36 PM    Final    CT CORONARY MORPH W/CTA COR W/SCORE W/CA W/CM &/OR WO/CM Addendum Date: 10/08/2023 ADDENDUM REPORT: 10/08/2023 14:40 EXAM: OVER-READ INTERPRETATION  CT CHEST The following report is an over-read performed by radiologist Dr. Morgane [facility]aveauof [facility] on 10/08/2023. This over-read does not include interpretation of cardiac or coronary anatomy or pathology. The cardiac/coronary CT interpretation by the cardiologist is attached. COMPARISON:  CT  abdomen pelvis  07/17/2023, CT cardiac 06/23/2023 FINDINGS: Visualized portions of the lungs are clear. Atherosclerotic plaque. Tiny hiatal hernia. No lymphadenopathy. No acute upper abdominal abnormality. No acute osseous abnormality. No acute soft tissue abnormality. IMPRESSION: 1. Tiny hiatal hernia. 2.  Aortic Atherosclerosis (ICD10-I70.0). Electronically Signed   By: Morgane  Naveau M.D.   On: 10/08/2023 14:40   Result Date: 10/08/2023 HISTORY: Chest pain/anginal equiv, intermediate CAD risk, not treadmill candidate EXAM: Cardiac/Coronary  CT TECHNIQUE: The patient was scanned on a [employer]. PROTOCOL: A 120 kV prospective scan was triggered in the descending thoracic aorta at 111 HU's. Axial non-contrast 3 mm slices were carried out through the heart. The data set was analyzed on a dedicated work station and scored using the Agatston method. Gantry rotation speed was 250 msecs and collimation was .6 mm. Beta blockade and 0.8 mg of sl NTG was given. The 3D data set was reconstructed in 5% intervals of the 35-75 % of the R-R cycle. Systolic and diastolic phases were analyzed on a dedicated work station using MPR, MIP and VRT modes. The patient received contrast: 100mL OMNIPAQUE  IOHEXOL  350 MG/ML SOLN. FINDINGS: Image quality: Good Noise artifact is: Limited Coronary calcium  score is 211, which places the patient in the 85th percentile for age and sex matched control. Coronary arteries: Normal coronary origins.  Right dominance. Right Coronary Artery: Minimal plaque ostial RCA, <25% stenosis. Moderate plaque mid RCA, 50-69% stenosis. Left Main Coronary Artery: No detectable plaque or stenosis. Left Anterior Descending Coronary Artery: Mild plaque proximal LAD, 25-49% stenosis. Moderate serial lesions mid LAD and first diagonal branch, 50-69% stenosis. Left Circumflex Artery: Mild plaque ostial OM1, 25-49% stenosis. Aorta: Normal size, 30 mm at the mid ascending aorta (level of the PA bifurcation) measured double  oblique. Aortic Valve: No calcifications. Other findings: Normal pulmonary vein drainage into the left atrium. Normal left atrial appendage without thrombus. Normal size of the pulmonary artery. Please see separate report from [facility] for non-cardiac findings. IMPRESSION: 1. Moderate CAD in the mid LAD, mid RCA and proximal first diagonal artery, 50-69% stenosis, CADRADS 3. CT FFR will be performed and reported separately. 2. Total plaque volume 304 mm3 which is 63rd percentile for age- and sex-matched controls (calcified plaque 61 mm3; non-calcified plaque 243 mm3). TPV is severe. 3. Coronary calcium  score is 211, which places the patient in the 85th percentile for age and sex matched control. 4. Normal coronary origins with right dominance. RECOMMENDATIONS: CAD-RADS 3. Moderate stenosis. Consider symptom-guided anti-ischemic pharmacotherapy as well as risk factor modification per guideline directed care. Additional analysis with CT FFR will be submitted. Electronically Signed: By: Soyla Merck M.D. On: 09/27/2023 16:55   CT CORONARY FRACTIONAL FLOW RESERVE FLUID ANALYSIS Result Date: 09/27/2023 EXAM: CT FFR analysis was performed on the original cardiac CTA dataset. Diagrammatic representation of the CT FFR analysis is provided in a separate PDF document in PACS. This dictation was created using the PDF document and an interactive 3D model of the results. The 3D model is not available in the EMR/PACS. INTERPRETATION: CT FFR provides simultaneous calculation of pressure and flow across the entire coronary tree. For clinical decision making, CT FFR values should be obtained 1-2 cm distal to the lower border of each stenosis measured. Coronary CTA-related artifacts may impair the diagnostic accuracy of the original cardiac CTA and FFR CT results. *Due to the fact that CT FFR represents a mathematically-derived analysis, it is recommended that the results be interpreted as follows: 1. CT  FFR >0.80:  Low likelihood of hemodynamic significance. 2. CT FFR 0.76-0.80: Borderline likelihood of hemodynamic significance. 3. CT FFR =< 0.75: High likelihood of hemodynamic significance. *Coronary CT Angiography-derived Fractional Flow Reserve Testing in Patients with Stable Coronary Artery Disease: Recommendations on Interpretation and Reporting. Radiology: Cardiothoracic Imaging. 2019;1(5):[id number] FINDINGS: 1. Left Main: Low likelihood of hemodynamic significance. 2. LAD: Low likelihood of hemodynamic significance. 3. LCX: Low likelihood of hemodynamic significance. 4. RCA: Low likelihood of hemodynamic significance. IMPRESSION: 1.  CT FFR analysis showed no hemodynamically significant stenosis. RECOMMENDATIONS: Guideline directed medical therapy for secondary prevention of CAD. Electronically Signed   By: Soyla Merck M.D.   On: 09/27/2023 16:55    Recent Labs: Lab Results  Component Value Date   WBC 11.1 (H) 07/25/2023   HGB 12.3 07/25/2023   PLT 487.0 (H) 07/25/2023   NA 136 09/15/2023   K 4.2 09/15/2023   CL 97 09/15/2023   CO2 30 09/15/2023   GLUCOSE 110 (H) 09/15/2023   BUN 24 (H) 09/15/2023   CREATININE 0.86 09/15/2023   BILITOT 0.5 09/15/2023   ALKPHOS 75 09/15/2023   AST 19 09/15/2023   ALT 16 09/15/2023   PROT 7.8 09/15/2023   ALBUMIN 4.6 09/15/2023   CALCIUM  10.6 (H) 09/15/2023   CALCIUM  10.4 09/15/2023   GFRAA >60 02/13/2019   September 15, 2023 vitamin D  normal, PTH normal, Speciality Comments: No specialty comments available.  Procedures:  No procedures performed Allergies: Ivp dye [iodinated contrast media], Latanoprost , Lisinopril, Onion, and Sulfa antibiotics   Assessment / Plan:     Visit Diagnoses: Positive ANA (antinuclear antibody) - 05/10/23: ANA 1:80NH, dsDNA-, Scl-70-, Sm-, SM/RNP-, Ro-, La-. 06/01/23: RF-, anti-CCP-.  Patient has low titer positive ANA.  ENA panel negative.  She does not have any clinical features of lupus or related disease.  No further workup is  needed.  Pain in both hands -she complains of pain and discomfort in her bilateral hands and intermittent swelling.  She has had bilateral CMC injections x 2 in the last couple of years.  No synovitis was noted.  CMC, PIP and DIP thickening was noted.  Plan: XR Hand 2 View Right, XR Hand 2 View Left.x-ray close history of osteoarthritis of the hands.  A handout on an exercise was given.  Joint protection was.  He was discussed.  Chronic pain of left knee -she complains of chronic pain and recurrent swelling in her left knee joint.  No warmth swelling or effusion was noted today.  Left knee joint was in full range of motion.  Plan: XR KNEE 3 VIEW LEFT.  Moderate chondromalacia patella was noted.  He handout on lower extremity muscle strength exercises was given.  Pain in both feet -she complains of pain and discomfort in her bilateral feet with intermittent swelling.  No synovitis or dactylitis was noted.  There was no plantar fasciitis or Achilles tendinitis.  Patient states she has had plantar fasciitis in the past requiring wearing a boot..  Plan: XR Foot 2 Views Right, XR Foot 2 Views.  X-ray with history of osteoarthritis.  Left, Sedimentation rate, Uric acid.    Plantar fasciitis bilateral-patient gives history of plantar fasciitis in the past.  No plantar fasciitis was noted today.  Chronic SI joint pain -she gives history of chronic discomfort in the SI joints.  She had tenderness over left SI joint today.  Plan: XR Pelvis 1-2 Views, mild spurring was noted.  No SI joint narrowing or sclerosis was noted. A handout  on back exercises was given.  Rash-she had a circular patch on her left calf which could be suggestive of psoriasis.  There is family history of psoriasis in her nephew.  Other fatigue-she gives history of chronic fatigue.  She works long hours.  Other medical problems are listed as follows:  Elevated coronary artery calcium  score 133; 72nd percentile  Primary hypertension-blood  pressure was normal today.  Systolic dysfunction  Type 2 diabetes mellitus with hyperlipidemia (HCC)  Diverticulitis of large intestine with perforation and abscess without bleeding - Small bowel resection 2015  Gastroesophageal reflux disease without esophagitis  Irritable bowel syndrome with diarrhea  Abnormal SPEP  B12 deficiency  Hypercalcemia  Other iron  deficiency anemia  Severe obstructive sleep apnea - on CPAP  Family history of psoriasis-nephew  Orders: Orders Placed This Encounter  Procedures   XR Hand 2 View Right   XR Hand 2 View Left   XR KNEE 3 VIEW LEFT   XR Foot 2 Views Right   XR Foot 2 Views Left   XR Pelvis 1-2 Views   Sedimentation rate   Uric acid   No orders of the defined types were placed in this encounter.    Follow-Up Instructions: Return for Osteoarthritis.   Maya Nash, MD  Note - This record has been created using Animal nutrition[employer].  Chart creation errors have been sought, but may not always  have been located. Such creation errors do not reflect on  the standard of medical care.

## 2023-10-17 ENCOUNTER — Ambulatory Visit (HOSPITAL_COMMUNITY)
Admission: RE | Admit: 2023-10-17 | Discharge: 2023-10-17 | Disposition: A | Discharge status: Home / Self Care | Source: Ambulatory Visit | Attending: Cardiology | Admitting: Cardiology

## 2023-10-17 DIAGNOSIS — R0602 Shortness of breath: Secondary | ICD-10-CM | POA: Diagnosis not present

## 2023-10-17 DIAGNOSIS — R072 Precordial pain: Secondary | ICD-10-CM | POA: Insufficient documentation

## 2023-10-17 LAB — ECHOCARDIOGRAM COMPLETE
AR max vel: 1.57 cm2
AV Area VTI: 1.64 cm2
AV Area mean vel: 1.55 cm2
AV Mean grad: 8 mmHg
AV Peak grad: 16.5 mmHg
Ao pk vel: 2.03 m/s
Area-P 1/2: 2.5 cm2
S' Lateral: 2.5 cm

## 2023-10-18 ENCOUNTER — Ambulatory Visit: Payer: Medicare Other | Attending: Rheumatology | Admitting: Rheumatology

## 2023-10-18 ENCOUNTER — Ambulatory Visit

## 2023-10-18 ENCOUNTER — Encounter: Payer: Self-pay | Admitting: Rheumatology

## 2023-10-18 VITALS — BP 110/71 | HR 53 | Resp 16 | Ht 64.0 in | Wt 194.0 lb

## 2023-10-18 DIAGNOSIS — R931 Abnormal findings on diagnostic imaging of heart and coronary circulation: Secondary | ICD-10-CM

## 2023-10-18 DIAGNOSIS — R21 Rash and other nonspecific skin eruption: Secondary | ICD-10-CM

## 2023-10-18 DIAGNOSIS — I10 Essential (primary) hypertension: Secondary | ICD-10-CM

## 2023-10-18 DIAGNOSIS — G8929 Other chronic pain: Secondary | ICD-10-CM

## 2023-10-18 DIAGNOSIS — R778 Other specified abnormalities of plasma proteins: Secondary | ICD-10-CM

## 2023-10-18 DIAGNOSIS — K580 Irritable bowel syndrome with diarrhea: Secondary | ICD-10-CM

## 2023-10-18 DIAGNOSIS — R5383 Other fatigue: Secondary | ICD-10-CM | POA: Diagnosis not present

## 2023-10-18 DIAGNOSIS — I519 Heart disease, unspecified: Secondary | ICD-10-CM | POA: Diagnosis not present

## 2023-10-18 DIAGNOSIS — M79642 Pain in left hand: Secondary | ICD-10-CM

## 2023-10-18 DIAGNOSIS — M79641 Pain in right hand: Secondary | ICD-10-CM | POA: Diagnosis not present

## 2023-10-18 DIAGNOSIS — K5720 Diverticulitis of large intestine with perforation and abscess without bleeding: Secondary | ICD-10-CM

## 2023-10-18 DIAGNOSIS — R768 Other specified abnormal immunological findings in serum: Secondary | ICD-10-CM

## 2023-10-18 DIAGNOSIS — M79671 Pain in right foot: Secondary | ICD-10-CM | POA: Diagnosis not present

## 2023-10-18 DIAGNOSIS — M79672 Pain in left foot: Secondary | ICD-10-CM

## 2023-10-18 DIAGNOSIS — E785 Hyperlipidemia, unspecified: Secondary | ICD-10-CM

## 2023-10-18 DIAGNOSIS — D508 Other iron deficiency anemias: Secondary | ICD-10-CM

## 2023-10-18 DIAGNOSIS — M722 Plantar fascial fibromatosis: Secondary | ICD-10-CM

## 2023-10-18 DIAGNOSIS — M533 Sacrococcygeal disorders, not elsewhere classified: Secondary | ICD-10-CM

## 2023-10-18 DIAGNOSIS — K219 Gastro-esophageal reflux disease without esophagitis: Secondary | ICD-10-CM

## 2023-10-18 DIAGNOSIS — E538 Deficiency of other specified B group vitamins: Secondary | ICD-10-CM

## 2023-10-18 DIAGNOSIS — Z840 Family history of diseases of the skin and subcutaneous tissue: Secondary | ICD-10-CM

## 2023-10-18 DIAGNOSIS — M25562 Pain in left knee: Secondary | ICD-10-CM

## 2023-10-18 DIAGNOSIS — E1169 Type 2 diabetes mellitus with other specified complication: Secondary | ICD-10-CM | POA: Diagnosis not present

## 2023-10-18 DIAGNOSIS — G4733 Obstructive sleep apnea (adult) (pediatric): Secondary | ICD-10-CM

## 2023-10-18 LAB — SEDIMENTATION RATE: Sed Rate: 34 mm/h — ABNORMAL HIGH (ref 0–30)

## 2023-10-18 LAB — URIC ACID: Uric Acid, Serum: 4.8 mg/dL (ref 2.5–7.0)

## 2023-10-18 NOTE — Patient Instructions (Signed)
 Osteoarthritis  Osteoarthritis is a type of arthritis. It refers to joint pain or joint disease. Osteoarthritis affects tissue that covers the ends of bones in joints (cartilage). Cartilage acts as a cushion between the bones and helps them move smoothly. Osteoarthritis occurs when cartilage in the joints gets worn down. Osteoarthritis is sometimes called "wear and tear" arthritis. Osteoarthritis is the most common form of arthritis. It often occurs in older people. It is a condition that gets worse over time. The joints most often affected by this condition are in the fingers, toes, hips, knees, and spine, including the neck and lower back. What are the causes? This condition is caused by the wearing down of cartilage that covers the ends of bones. What increases the risk? The following factors may make you more likely to develop this condition: Being age 79 or older. Obesity. Overuse of joints. Past injury of a joint. Past surgery on a joint. Family history of osteoarthritis. What are the signs or symptoms? The main symptoms of this condition are pain, swelling, and stiffness in the joint. Other symptoms may include: An enlarged joint. More pain and further damage caused by small pieces of bone or cartilage that break off and float inside of the joint. Small deposits of bone (osteophytes) that grow on the edges of the joint. A grating or scraping feeling inside the joint when you move it. Popping or creaking sounds when you move. Difficulty walking or exercising. An inability to grip items, twist your hand, or control the movements of your hands and fingers. How is this diagnosed? This condition may be diagnosed based on: Your medical history. A physical exam. Your symptoms. X-rays of the affected joints. Blood tests to rule out other types of arthritis. How is this treated? There is no cure for this condition, but treatment can help control pain and improve joint function. Treatment  may include a combination of therapies, such as: Pain relief techniques, such as: Applying heat and cold to the joint. Massage. A form of talk therapy called cognitive behavioral therapy (CBT). This therapy helps you set goals and follow up on the changes that you make. Medicines for pain and inflammation. The medicines can be taken by mouth or applied to the skin. They include: NSAIDs, such as ibuprofen. Prescription medicines. Strong anti-inflammatory medicines (corticosteroids). Certain nutritional supplements. A prescribed exercise program. You may work with a physical therapist. Assistive devices, such as a brace, wrap, splint, specialized glove, or cane. A weight control plan. Surgery, such as: An osteotomy. This is done to reposition the bones and relieve pain or to remove loose pieces of bone and cartilage. Joint replacement surgery. You may need this surgery if you have advanced osteoarthritis. Follow these instructions at home: Activity Rest your affected joints as told by your health care provider. Exercise as told by your provider. The provider may recommend specific types of exercise, such as: Strengthening exercises. These are done to strengthen the muscles that support joints affected by arthritis. Aerobic activities. These are exercises, such as brisk walking or water aerobics, that increase your heart rate. Range-of-motion activities. These help your joints move more easily. Balance and agility exercises. Managing pain, stiffness, and swelling     If told, apply heat to the affected area as often as told by your provider. Use the heat source that your provider recommends, such as a moist heat pack or a heating pad. If you have a removable assistive device, remove it as told by your provider. Place a  towel between your skin and the heat source. If your provider tells you to keep the assistive device on while you apply heat, place a towel between the assistive device and  the heat source. Leave the heat on for 20-30 minutes. If told, put ice on the affected area. If you have a removable assistive device, remove it as told by your provider. Put ice in a plastic bag. Place a towel between your skin and the bag. If your provider tells you to keep the assistive device on during icing, place a towel between the assistive device and the bag. Leave the ice on for 20 minutes, 2-3 times a day. If your skin turns bright red, remove the ice or heat right away to prevent skin damage. The risk of damage is higher if you cannot feel pain, heat, or cold. Move your fingers or toes often to reduce stiffness and swelling. Raise (elevate) the affected area above the level of your heart while you are sitting or lying down. General instructions Take over-the-counter and prescription medicines only as told by your provider. Maintain a healthy weight. Follow instructions from your provider for weight control. Do not use any products that contain nicotine or tobacco. These products include cigarettes, chewing tobacco, and vaping devices, such as e-cigarettes. If you need help quitting, ask your provider. Use assistive devices as told by your provider. Where to find more information [employer] and Musculoskeletal [employer] Skin Diseases: niams.http://www.myers.net/ [employer] on Aging: BaseRingTones.pl [employer] of Rheumatology: rheumatology.org Contact a health care provider if: You have redness, swelling, or a feeling of warmth in a joint that gets worse. You have a fever along with joint or muscle aches. You develop a rash. You have trouble doing your normal activities. You have pain that gets worse and is not relieved by pain medicine. This information is not intended to replace advice given to you by your health care provider. Make sure you discuss any questions you have with your health care provider. Document Revised: 11/25/2021 Document Reviewed:  11/25/2021 [employer] Patient Education  2024 [employer]  Hand Exercises Hand exercises can be helpful for almost anyone. They can strengthen your hands and improve flexibility and movement. The exercises can also increase blood flow to the hands. These results can make your work and daily tasks easier for you. Hand exercises can be especially helpful for people who have joint pain from arthritis or nerve damage from using their hands over and over. These exercises can also help people who injure a hand. Exercises Most of these hand exercises are gentle stretching and motion exercises. It is usually safe to do them often throughout the day. Warming up your hands before exercise may help reduce stiffness. You can do this with gentle massage or by placing your hands in warm water for 10-15 minutes. It is normal to feel some stretching, pulling, tightness, or mild discomfort when you begin new exercises. In time, this will improve. Remember to always be careful and stop right away if you feel sudden, very bad pain or your pain gets worse. You want to get better and be safe. Ask your health care provider which exercises are safe for you. Do exercises exactly as told by your provider and adjust them as told. Do not begin these exercises until told by your provider. Knuckle bend or "claw" fist  Stand or sit with your arm, hand, and all five fingers pointed straight up. Make sure to keep your wrist straight. Gently  bend your fingers down toward your palm until the tips of your fingers are touching your palm. Keep your big knuckle straight and only bend the small knuckles in your fingers. Hold this position for 10 seconds. Straighten your fingers back to your starting position. Repeat this exercise 5-10 times with each hand. Full finger fist  Stand or sit with your arm, hand, and all five fingers pointed straight up. Make sure to keep your wrist straight. Gently bend your fingers into your palm until  the tips of your fingers are touching the middle of your palm. Hold this position for 10 seconds. Extend your fingers back to your starting position, stretching every joint fully. Repeat this exercise 5-10 times with each hand. Straight fist  Stand or sit with your arm, hand, and all five fingers pointed straight up. Make sure to keep your wrist straight. Gently bend your fingers at the big knuckle, where your fingers meet your hand, and at the middle knuckle. Keep the knuckle at the tips of your fingers straight and try to touch the bottom of your palm. Hold this position for 10 seconds. Extend your fingers back to your starting position, stretching every joint fully. Repeat this exercise 5-10 times with each hand. Tabletop  Stand or sit with your arm, hand, and all five fingers pointed straight up. Make sure to keep your wrist straight. Gently bend your fingers at the big knuckle, where your fingers meet your hand, as far down as you can. Keep the small knuckles in your fingers straight. Think of forming a tabletop with your fingers. Hold this position for 10 seconds. Extend your fingers back to your starting position, stretching every joint fully. Repeat this exercise 5-10 times with each hand. Finger spread  Place your hand flat on a table with your palm facing down. Make sure your wrist stays straight. Spread your fingers and thumb apart from each other as far as you can until you feel a gentle stretch. Hold this position for 10 seconds. Bring your fingers and thumb tight together again. Hold this position for 10 seconds. Repeat this exercise 5-10 times with each hand. Making circles  Stand or sit with your arm, hand, and all five fingers pointed straight up. Make sure to keep your wrist straight. Make a circle by touching the tip of your thumb to the tip of your index finger. Hold for 10 seconds. Then open your hand wide. Repeat this motion with your thumb and each of your  fingers. Repeat this exercise 5-10 times with each hand. Thumb motion  Sit with your forearm resting on a table and your wrist straight. Your thumb should be facing up toward the ceiling. Keep your fingers relaxed as you move your thumb. Lift your thumb up as high as you can toward the ceiling. Hold for 10 seconds. Bend your thumb across your palm as far as you can, reaching the tip of your thumb for the small finger (pinkie) side of your palm. Hold for 10 seconds. Repeat this exercise 5-10 times with each hand. Grip strengthening  Hold a stress ball or other soft ball in the middle of your hand. Slowly increase the pressure, squeezing the ball as much as you can without causing pain. Think of bringing the tips of your fingers into the middle of your palm. All of your finger joints should bend when doing this exercise. Hold your squeeze for 10 seconds, then relax. Repeat this exercise 5-10 times with each hand. Contact a health  care provider if: Your hand pain or discomfort gets much worse when you do an exercise. Your hand pain or discomfort does not improve within 2 hours after you exercise. If you have either of these problems, stop doing these exercises right away. Do not do them again unless your provider says that you can. Get help right away if: You develop sudden, severe hand pain or swelling. If this happens, stop doing these exercises right away. Do not do them again unless your provider says that you can. This information is not intended to replace advice given to you by your health care provider. Make sure you discuss any questions you have with your health care provider. Document Revised: 04/12/2022 Document Reviewed: 04/12/2022 Else[employer] Patient Education  2024 [employer] Exercises for Chronic Knee Pain Chronic knee pain is pain that lasts longer than 3 months. For most people with chronic knee pain, exercise and weight loss is an important part of treatment. Your health care  provider may want you to focus on: Making the muscles that support your knee stronger. This can take pressure off your knee and reduce pain. Preventing knee stiffness. How far you can move your knee, keeping it there or making it farther. Losing weight (if this applies) to take pressure off your knee, lower your risk for injury, and make it easier for you to exercise. Your provider will help you make an exercise program that fits your needs and physical abilities. Below are simple, low-impact exercises you can do at home. Ask your provider or physical therapist how often you should do your exercise program and how many times to repeat each exercise. General safety tips  Get your provider's approval before doing any exercises. Start slowly and stop any time you feel pain. Do not exercise if your knee pain is flaring up. Warm up first. Stretching a cold muscle can cause an injury. Do 5-10 minutes of easy movement or light stretching before beginning your exercises. Do 5-10 minutes of low-impact activity (like walking or cycling) before starting strengthening exercises. Contact your provider any time you have pain during or after exercising. Exercise can cause discomfort but should not be painful. It is normal to be a little stiff or sore after exercising. Stretching and range-of-motion exercises Front thigh stretch  Stand up straight and support your body by holding on to a chair or resting one hand on a wall. With your legs straight and close together, bend one knee to lift your heel up toward your butt. Using one hand for support, grab your ankle with your free hand. Pull your foot up closer toward your butt to feel the stretch in front of your thigh. Hold the stretch for 30 seconds. Repeat __________ times. Complete this exercise __________ times a day. Back thigh stretch  Sit on the floor with your back straight and your legs out straight in front of you. Place the palms of your hands on  the floor and slide them toward your feet as you bend at the hip. Try to touch your nose to your knees and feel the stretch in the back of your thighs. Hold for 30 seconds. Repeat __________ times. Complete this exercise __________ times a day. Calf stretch  Stand facing a wall. Place the palms of your hands flat against the wall, arms extended, and lean slightly against the wall. Get into a lunge position with one leg bent at the knee and the other leg stretched out straight behind you. Keep both feet facing  the wall and increase the bend in your knee while keeping the heel of the other leg flat on the ground. You should feel the stretch in your calf. Hold for 30 seconds. Repeat __________ times. Complete this exercise __________ times a day. Strengthening exercises Straight leg lift  Lie on your back with one knee bent and the other leg out straight. Slowly lift the straight leg without bending the knee. Lift until your foot is about 12 inches (30 cm) off the floor. Hold for 3-5 seconds and slowly lower your leg. Repeat __________ times. Complete this exercise __________ times a day. Single leg dip  Stand between two chairs and put both hands on the backs of the chairs for support. Extend one leg out straight with your body weight resting on the heel of the standing leg. Slowly bend your standing knee to dip your body to the level that is comfortable for you. Hold for 3-5 seconds. Repeat __________ times. Complete this exercise __________ times a day. Hamstring curls  Stand straight, knees close together, facing the back of a chair. Hold on to the back of a chair with both hands. Keep one leg straight. Bend the other knee while bringing the heel up toward the butt until the knee is bent at a 90-degree angle (right angle). Hold for 3-5 seconds. Repeat __________ times. Complete this exercise __________ times a day. Wall squat  Stand straight with your back, hips, and head against  a wall. Step forward one foot at a time with your back still against the wall. Your feet should be 2 feet (61 cm) from the wall at shoulder width. Keeping your back, hips, and head against the wall, slide down the wall to as close to a sitting position as you can get. Hold for 5-10 seconds, then slowly slide back up. Repeat __________ times. Complete this exercise __________ times a day. Step-ups  Stand in front of a sturdy platform or stool that is about 6 inches (15 cm) high. Slowly step up with your left / right foot, keeping your knee in line with your hip and foot. Do not let your knee bend so far that you cannot see your toes. Hold on to a chair for balance, but do not use it for support. Slowly unlock your knee and lower yourself to the starting position. Repeat __________ times. Complete this exercise __________ times a day. Contact a health care provider if: Your exercises cause pain. Your pain is worse after you exercise. Your pain prevents you from doing your exercises. This information is not intended to replace advice given to you by your health care provider. Make sure you discuss any questions you have with your health care provider. Document Revised: 04/12/2022 Document Reviewed: 04/12/2022 Else[employer] Patient Education  2024 [employer]  Low Back Sprain or Strain Rehab Ask your health care provider which exercises are safe for you. Do exercises exactly as told by your health care provider and adjust them as directed. It is normal to feel mild stretching, pulling, tightness, or discomfort as you do these exercises. Stop right away if you feel sudden pain or your pain gets worse. Do not begin these exercises until told by your health care provider. Stretching and range-of-motion exercises These exercises warm up your muscles and joints and improve the movement and flexibility of your back. These exercises also help to relieve pain, numbness, and tingling. Lumbar rotation  Lie  on your back on a firm bed or the floor with your  knees bent. Straighten your arms out to your sides so each arm forms a 90-degree angle (right angle) with a side of your body. Slowly move (rotate) both of your knees to one side of your body until you feel a stretch in your lower back (lumbar). Try not to let your shoulders lift off the floor. Hold this position for __________ seconds. Tense your abdominal muscles and slowly move your knees back to the starting position. Repeat this exercise on the other side of your body. Repeat __________ times. Complete this exercise __________ times a day. Single knee to chest  Lie on your back on a firm bed or the floor with both legs straight. Bend one of your knees. Use your hands to move your knee up toward your chest until you feel a gentle stretch in your lower back and buttock. Hold your leg in this position by holding on to the front of your knee. Keep your other leg as straight as possible. Hold this position for __________ seconds. Slowly return to the starting position. Repeat with your other leg. Repeat __________ times. Complete this exercise __________ times a day. Prone extension on elbows  Lie on your abdomen on a firm bed or the floor (prone position). Prop yourself up on your elbows. Use your arms to help lift your chest up until you feel a gentle stretch in your abdomen and your lower back. This will place some of your body weight on your elbows. If this is uncomfortable, try stacking pillows under your chest. Your hips should stay down, against the surface that you are lying on. Keep your hip and back muscles relaxed. Hold this position for __________ seconds. Slowly relax your upper body and return to the starting position. Repeat __________ times. Complete this exercise __________ times a day. Strengthening exercises These exercises build strength and endurance in your back. Endurance is the ability to use your muscles for a long  time, even after they get tired. Pelvic tilt This exercise strengthens the muscles that lie deep in the abdomen. Lie on your back on a firm bed or the floor with your legs extended. Bend your knees so they are pointing toward the ceiling and your feet are flat on the floor. Tighten your lower abdominal muscles to press your lower back against the floor. This motion will tilt your pelvis so your tailbone points up toward the ceiling instead of pointing to your feet or the floor. To help with this exercise, you may place a small towel under your lower back and try to push your back into the towel. Hold this position for __________ seconds. Let your muscles relax completely before you repeat this exercise. Repeat __________ times. Complete this exercise __________ times a day. Alternating arm and leg raises  Get on your hands and knees on a firm surface. If you are on a hard floor, you may want to use padding, such as an exercise mat, to cushion your knees. Line up your arms and legs. Your hands should be directly below your shoulders, and your knees should be directly below your hips. Lift your left leg behind you. At the same time, raise your right arm and straighten it in front of you. Do not lift your leg higher than your hip. Do not lift your arm higher than your shoulder. Keep your abdominal and back muscles tight. Keep your hips facing the ground. Do not arch your back. Keep your balance carefully, and do not hold your breath. Hold this  position for __________ seconds. Slowly return to the starting position. Repeat with your right leg and your left arm. Repeat __________ times. Complete this exercise __________ times a day. Abdominal set with straight leg raise  Lie on your back on a firm bed or the floor. Bend one of your knees and keep your other leg straight. Tense your abdominal muscles and lift your straight leg up, 4-6 inches (10-15 cm) off the ground. Keep your abdominal  muscles tight and hold this position for __________ seconds. Do not hold your breath. Do not arch your back. Keep it flat against the ground. Keep your abdominal muscles tense as you slowly lower your leg back to the starting position. Repeat with your other leg. Repeat __________ times. Complete this exercise __________ times a day. Single leg lower with bent knees Lie on your back on a firm bed or the floor. Tense your abdominal muscles and lift your feet off the floor, one foot at a time, so your knees and hips are bent in 90-degree angles (right angles). Your knees should be over your hips and your lower legs should be parallel to the floor. Keeping your abdominal muscles tense and your knee bent, slowly lower one of your legs so your toe touches the ground. Lift your leg back up to return to the starting position. Do not hold your breath. Do not let your back arch. Keep your back flat against the ground. Repeat with your other leg. Repeat __________ times. Complete this exercise __________ times a day. Posture and body mechanics Good posture and healthy body mechanics can help to relieve stress in your body's tissues and joints. Body mechanics refers to the movements and positions of your body while you do your daily activities. Posture is part of body mechanics. Good posture means: Your spine is in its natural S-curve position (neutral). Your shoulders are pulled back slightly. Your head is not tipped forward (neutral). Follow these guidelines to improve your posture and body mechanics in your everyday activities. Standing  When standing, keep your spine neutral and your feet about hip-width apart. Keep a slight bend in your knees. Your ears, shoulders, and hips should line up. When you do a task in which you stand in one place for a long time, place one foot up on a stable object that is 2-4 inches (5-10 cm) high, such as a footstool. This helps keep your spine  neutral. Sitting  When sitting, keep your spine neutral and keep your feet flat on the floor. Use a footrest, if necessary, and keep your thighs parallel to the floor. Avoid rounding your shoulders, and avoid tilting your head forward. When working at a desk or a computer, keep your desk at a height where your hands are slightly lower than your elbows. Slide your chair under your desk so you are close enough to maintain good posture. When working at a computer, place your monitor at a height where you are looking straight ahead and you do not have to tilt your head forward or downward to look at the screen. Resting When lying down and resting, avoid positions that are most painful for you. If you have pain with activities such as sitting, bending, stooping, or squatting, lie in a position in which your body does not bend very much. For example, avoid curling up on your side with your arms and knees near your chest (fetal position). If you have pain with activities such as standing for a long time or reaching  with your arms, lie with your spine in a neutral position and bend your knees slightly. Try the following positions: Lying on your side with a pillow between your knees. Lying on your back with a pillow under your knees. Lifting  When lifting objects, keep your feet at least shoulder-width apart and tighten your abdominal muscles. Bend your knees and hips and keep your spine neutral. It is important to lift using the strength of your legs, not your back. Do not lock your knees straight out. Always ask for help to lift heavy or awkward objects. This information is not intended to replace advice given to you by your health care provider. Make sure you discuss any questions you have with your health care provider. Document Revised: 08/01/2022 Document Reviewed: 06/15/2020 Elsevier Patient Education  2024 [employer].

## 2023-10-19 ENCOUNTER — Ambulatory Visit: Payer: Self-pay | Admitting: Rheumatology

## 2023-10-19 DIAGNOSIS — L308 Other specified dermatitis: Secondary | ICD-10-CM | POA: Diagnosis not present

## 2023-10-19 DIAGNOSIS — L300 Nummular dermatitis: Secondary | ICD-10-CM | POA: Diagnosis not present

## 2023-10-19 DIAGNOSIS — L821 Other seborrheic keratosis: Secondary | ICD-10-CM | POA: Diagnosis not present

## 2023-10-19 NOTE — Progress Notes (Signed)
 Uric acid is normal.  Sed rate is mildly elevated and better than before.  I will discuss results at the follow-up visit.

## 2023-10-30 ENCOUNTER — Other Ambulatory Visit: Payer: Self-pay | Admitting: Family Medicine

## 2023-11-07 NOTE — Progress Notes (Signed)
 Office Visit Note  Patient: Carol [name]             Date of Birth: Jan 26, 1956           MRN: [id number]             PCP: Catherine Charlies LABOR, DO Referring: Catherine Charlies LABOR, DO Visit Date: 11/21/2023 Occupation: @GUAROCC @  Subjective:  Pain in joints  History of Present Illness: Carol [name] is a 68 y.o. female with polyarthralgia, psoriasis and positive ANA.  She returns today after her initial visit on October 18, 2023.  She continues to have pain and discomfort in the bilateral hands especially her CMC joints.  She has discomfort in the left knee joint when she climbs stairs.  She continues to have some discomfort in her feet from plantar fasciitis.  She also has discomfort in the SI joints.  She went to see the dermatologist regarding the rash on her left calf which was diagnosed with psoriasis.  She was also told that she had some palmar psoriasis.  She was given topical agents which were helpful.  She has not noticed any joint swelling.    Activities of Daily Living:  Patient reports morning stiffness for 5 minutes.   Patient Denies nocturnal pain.  Difficulty dressing/grooming: Denies Difficulty climbing stairs: Denies Difficulty getting out of chair: Denies Difficulty using hands for taps, buttons, cutlery, and/or writing: Reports  Review of Systems  Constitutional:  Negative for fatigue.  HENT:  Negative for mouth sores and mouth dryness.   Eyes:  Negative for dryness.  Respiratory:  Negative for shortness of breath.   Cardiovascular:  Negative for chest pain and palpitations.  Gastrointestinal:  Negative for blood in stool, constipation and diarrhea.  Endocrine: Negative for increased urination.  Genitourinary:  Negative for involuntary urination.  Musculoskeletal:  Positive for joint pain, joint pain, myalgias, morning stiffness and myalgias. Negative for gait problem, joint swelling, muscle weakness and muscle tenderness.  Skin:  Negative for color change, rash, hair loss  and sensitivity to sunlight.  Allergic/Immunologic: Negative for susceptible to infections.  Neurological:  Negative for dizziness and headaches.  Hematological:  Negative for swollen glands.  Psychiatric/Behavioral:  Negative for depressed mood and sleep disturbance. The patient is not nervous/anxious.     PMFS History:  Patient Active Problem List   Diagnosis Date Noted   LLQ abdominal pain 09/26/2023   Altered bowel habits 09/26/2023   Severe obstructive sleep apnea 08/18/2023   Elevated coronary artery calcium  score 133; 72nd percentile 06/26/2023   ANA positive 05/15/2023   Abnormal SPEP 05/15/2023   B12 deficiency 01/13/2023   Irritable bowel syndrome with diarrhea 09/02/2022   Full incontinence of feces 09/02/2022   Recurrent UTI 01/10/2022   Obesity (BMI 30-39.9) 03/25/2021   Hypercalcemia 12/08/2020   Iron  deficiency anemia 12/08/2020   Hot flashes due to menopause 11/06/2019   Morbid obesity (HCC) 02/11/2019   Type 2 diabetes mellitus with hyperlipidemia (HCC) 02/11/2019   Diverticulitis large intestine 10/31/2013   Systolic dysfunction    Gastroesophageal reflux disease    Hypertension     Past Medical History:  Diagnosis Date   Angio-edema    Blood type O+    Chest discomfort    , Atypical   Chicken pox    COVID-19 05/09/2019   Diabetic acidosis, type II (HCC)    Diverticulitis - large vs small bowel vs both?    Gastroesophageal reflux disease    Glaucoma  Hyperlipidemia    Hypertension    Plantar fasciitis, bilateral 2015   PVC (premature ventricular contraction)    Sleep apnea 07/2023   Systolic dysfunction    , Hyperdynamic    Family History  Problem Relation Age of Onset   Heart disease Mother    Heart attack Mother    Asthma Mother    Early death Mother    Miscarriages / Stillbirths Mother    Emphysema Mother    Heart disease Father    Lung cancer Father    Diverticulitis Sister    Heart disease Sister    Heart attack Brother     Asthma Brother    Heart disease Brother    Kidney disease Brother    Heart disease Brother    Heart attack Brother    Heart disease Brother    Heart attack Brother    Meniere's disease Brother    Early death Maternal Grandmother    Tuberculosis Maternal Grandmother    Depression Maternal Grandfather    Heart disease Paternal Grandmother    Colon cancer Neg Hx    Esophageal cancer Neg Hx    Rectal cancer Neg Hx    Stomach cancer Neg Hx    Past Surgical History:  Procedure Laterality Date   APPENDECTOMY N/A 11/05/2013   Procedure: APPENDECTOMY;  Surgeon: Lynwood MALVA Pina, MD;  Location: [facility] OR;  Service: General;  Laterality: N/A;   BOWEL RESECTION N/A 11/05/2013   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  Normal performed by Dr. Annis   COLONOSCOPY  04/03/2019   COLONOSCOPY  11/17/2023   LAPAROTOMY N/A 11/05/2013   Procedure: EXPLORATORY LAPAROTOMY;  Surgeon: Lynwood MALVA Pina, MD;  Location: [facility] OR;  Service: General;  Laterality: N/A;   Lexiscan  2010   Patient reports normal Lexiscan completed around 2010 by Dr. Annis.   TOTAL ABDOMINAL HYSTERECTOMY  2004   Social History   Social History Narrative   Marital status/children/pets: Married.   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   Safety:      -smoke alarm in the home:Yes     - wears seatbelt: Yes     - Feels safe in their relationships: Yes   Immunization History  Administered Date(s) Administered   Fluad Quad(high Dose 65+) 02/21/2020, 01/06/2021, 03/02/2022, 12/22/2022   Influenza,inj,Quad PF,6+ Mos 03/30/2019   PFIZER(Purple Top)SARS-COV-2 Vaccination 07/02/2019, 07/30/2019, 01/19/2020   Pfizer Covid-19 Vaccine Bivalent Booster 50yrs & up 02/12/2021   Pneumococcal Conjugate-13 03/11/2020   Pneumococcal Polysaccharide-23 03/24/2021   Tdap 08/27/2012, 12/22/2022   Zoster Recombinant(Shingrix) 11/05/2019,  02/03/2020     Objective: Vital Signs: BP 129/69 (BP Location: Left Arm, Patient Position: Sitting, Cuff Size: Normal)   Pulse 60   Resp 16   Ht 5' 3 (1.6 m)   Wt 193 lb (87.5 kg)   BMI 34.19 kg/m    Physical Exam Vitals and nursing note reviewed.  Constitutional:      Appearance: She is well-developed.  HENT:     Head: Normocephalic and atraumatic.  Eyes:     Conjunctiva/sclera: Conjunctivae normal.  Cardiovascular:     Rate and Rhythm: Normal rate and regular rhythm.     Heart sounds: Normal heart sounds.  Pulmonary:     Effort: Pulmonary effort is normal.     Breath sounds: Normal breath sounds.  Abdominal:     General: Bowel sounds are normal.  Palpations: Abdomen is soft.  Musculoskeletal:     Cervical back: Normal range of motion.  Lymphadenopathy:     Cervical: No cervical adenopathy.  Skin:    General: Skin is warm and dry.     Capillary Refill: Capillary refill takes less than 2 seconds.  Neurological:     Mental Status: She is alert and oriented to person, place, and time.  Psychiatric:        Behavior: Behavior normal.      Musculoskeletal Exam: Cervical, thoracic and lumbar spine were in good range of motion.  Shoulders, elbows, wrist joints, MCPs with good range of motion.  She had bilateral CMC PIP and DIP thickening with tenderness over CMC joints.  Hip joints and knee joints with good range of motion.  There was no warmth swelling or effusion.  There was no tenderness over her ankles or MTPs.  No Achilles tendinitis or plantar fasciitis was noted.  No dactylitis was noted.  CDAI Exam: CDAI Score: -- Patient Global: --; Provider Global: -- Swollen: --; Tender: -- Joint Exam 11/21/2023   No joint exam has been documented for this visit   There is currently no information documented on the homunculus. Go to the Rheumatology activity and complete the homunculus joint exam.  Investigation: No additional findings.  Imaging: No results  found.   Recent Labs: Lab Results  Component Value Date   WBC 11.1 (H) 07/25/2023   HGB 12.3 07/25/2023   PLT 487.0 (H) 07/25/2023   NA 136 09/15/2023   K 4.2 09/15/2023   CL 97 09/15/2023   CO2 30 09/15/2023   GLUCOSE 110 (H) 09/15/2023   BUN 24 (H) 09/15/2023   CREATININE 0.86 09/15/2023   BILITOT 0.5 09/15/2023   ALKPHOS 75 09/15/2023   AST 19 09/15/2023   ALT 16 09/15/2023   PROT 7.8 09/15/2023   ALBUMIN 4.6 09/15/2023   CALCIUM  10.6 (H) 09/15/2023   CALCIUM  10.4 09/15/2023   GFRAA >60 02/13/2019   October 18, 2023 sed rate 34, uric acid 4.8 05/10/23: ANA 1:80NH, dsDNA-, Scl-70-, Sm-, SM/RNP-, Ro-, La-. 06/01/23: RF-, anti-CCP-   Speciality Comments: No specialty comments available.  Procedures:  No procedures performed Allergies: Ivp dye [iodinated contrast media], Latanoprost , Lisinopril, Onion, and Sulfa antibiotics   Assessment / Plan:     Visit Diagnoses: Primary osteoarthritis of both hands - History of intermittent swelling.  No synovitis was noted on the examination.  Status post CMC injections.  She continues to have pain and stiffness in her bilateral CMC joints.  She had bilateral PIP and DIP thickening.  X-rays were suggestive of osteoarthritis.  X-ray findings were reviewed with the patient and her niece Rosina.  I offered ultrasound examination if she has recurrence of swelling.  Sed rate mildly elevated.  Joint protection muscle strengthening was discussed.  A handout on hand exercises was given.- Plan: Ambulatory referral to Physical Therapy  Chondromalacia patellae, left knee - History of pain and recurrent swelling.  No swelling, warmth or synovitis was noted on the examination.  X-rays showed moderate chondromalacia patella.  X-ray findings were reviewed with the patient.  I will refer her to physical therapy.  A handout on lower extremity exercises was given.- Plan: Ambulatory referral to Physical Therapy  Primary osteoarthritis of both feet - History of  intermittent swelling.  No synovitis was noted.  History of plantar fasciitis.  She had no plantar fasciitis at the last examination and today.  X-rays suggestive of osteoarthritis.  Uric acid  normal, sed rate high.  X-ray findings and labs were reviewed with the patient.  Plantar fasciitis, bilateral - History of plantar fasciitis in the past.  Chronic SI joint pain - Mild left SI joint tenderness.  X-rays obtained at the last visit showed some degenerative changes.  X-ray findings were reviewed with the patient.  She also had some tenderness over the left piriformis region.  I will refer her to physical therapy.- Plan: Ambulatory referral to Physical Therapy  Other psoriasis -she had dry scales on her hands and a psoriasis patch on her left calf.  The diagnosis was confirmed by the dermatologist.  Patient states she was given topical steroid cream which was helpful.  Positive ANA (antinuclear antibody) - 05/10/23: ANA 1:80NH, dsDNA-, Scl-70-, Sm-, SM/RNP-, Ro-, La-. 06/01/23: RF-, anti-CCP-: She has no clinical features of lupus or related disease.  Family history of psoriasis-nephew  Other medical problems are listed as follows:  Other fatigue  Systolic dysfunction  Primary hypertension  Elevated coronary artery calcium  score 133; 72nd percentile  Type 2 diabetes mellitus with hyperlipidemia (HCC)  Gastroesophageal reflux disease without esophagitis  Irritable bowel syndrome with diarrhea  Diverticulitis of large intestine with perforation and abscess without bleeding  Abnormal SPEP  Hypercalcemia  B12 deficiency  Other iron  deficiency anemia  Severe obstructive sleep apnea  Orders: Orders Placed This Encounter  Procedures   Ambulatory referral to Physical Therapy   No orders of the defined types were placed in this encounter.    Follow-Up Instructions: Return in about 6 months (around 05/23/2024) for Osteoarthritis.   Maya Nash, MD  Note - This record has  been created using Animal nutritionist.  Chart creation errors have been sought, but may not always  have been located. Such creation errors do not reflect on  the standard of medical care.

## 2023-11-08 NOTE — Telephone Encounter (Signed)
 I received a message from [place] with [employer], Adine   Looks like patient was setup on 09-25-2023 wih airsense 11 auto.  I went ahead and added your office to air view viewing rights for future review.

## 2023-11-10 ENCOUNTER — Encounter: Payer: Self-pay | Admitting: Internal Medicine

## 2023-11-16 NOTE — Progress Notes (Unsigned)
 [facility] History and Physical   Primary Care Physician:  Catherine Charlies LABOR, DO   Reason for Procedure:  History of diverticulitis, left lower quadrant pain  Plan:    Colonoscopy     HPI: Carol [name] is a 68 y.o. female with a CT diagnosis of sigmoid diverticulitis in April treated with antibiotics, she has had some persistent mild left lower quadrant discomfort and some intermittent diarrhea problems since then.  Last colonoscopy 2020, sigmoid diverticulosis with some narrowing of the lumen.  At that time there was a question of prior diverticulitis, though I had wondered if it was not an enteric infection.  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   Past Medical History:  Diagnosis Date   Angio-edema    Blood type O+    Chest discomfort    , Atypical   Chicken pox    COVID-19 05/09/2019   Diabetic acidosis, type II (HCC)    Diverticulitis - large vs small bowel vs both?    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  Normal performed by Dr. Annis   COLONOSCOPY  04/03/2019   LAPAROTOMY N/A 11/05/2013   Procedure: EXPLORATORY LAPAROTOMY;  Surgeon: Lynwood MALVA Pina, MD;  Location: [facility] OR;  Service: General;  Laterality: N/A;   Lexiscan  2010   Patient reports normal Lexiscan completed around 2010 by Dr. Annis.   TOTAL ABDOMINAL HYSTERECTOMY  2004      Current Outpatient Medications  Medication Sig Dispense Refill   Ascorbic Acid (VITAMIN C) 1000 MG tablet Take 1,000 mg by mouth daily.     aspirin  81 MG tablet Take 81 mg by mouth at bedtime.      candesartan -hydrochlorothiazide (ATACAND  HCT) 16-12.5 MG tablet Take 1 tablet by mouth daily. 90 tablet 1   cetirizine (ZYRTEC) 10 MG tablet Take 10 mg by mouth at bedtime.      citalopram  (CELEXA ) 20 MG tablet Take 1 tablet (20 mg total) by mouth daily. 90 tablet 1   empagliflozin  (JARDIANCE ) 25 MG TABS tablet Take 1 tablet (25 mg total) by mouth daily. 30 tablet 5   FERREX 150 150 MG capsule Take 1 capsule (150 mg total) by mouth daily. 90 capsule 3   fluocinonide  ointment (LIDEX ) 0.05 % Apply 1 Application topically 2 (two) times daily. (Patient taking differently: Apply 1 Application topically as needed.) 30 g 2   ibuprofen (ADVIL) 200 MG tablet Take 200 mg by mouth as needed.     metFORMIN  (GLUCOPHAGE -XR) 750 MG 24 hr tablet Take 1 tablet (750 mg total) by mouth daily with breakfast. 90 tablet 1   metoprolol  succinate (TOPROL -XL) 25 MG 24 hr tablet Take 1 tablet (25 mg total) by mouth daily. Take with or immediately following a meal. Needs OV for refills 90 tablet 1   omeprazole  (PRILOSEC) 20 MG capsule Take  1 capsule (20 mg total) by mouth daily. 90 capsule 1   ondansetron  (ZOFRAN ) 4 MG tablet Take 1 tablet 20 minutes before drinking colonoscopy prep. (Patient taking differently: as needed. Take 1 tablet 20 minutes before drinking colonoscopy prep.) 2 tablet 0   ONETOUCH ULTRA TEST test strip 2 (two) times daily.     rosuvastatin  (CRESTOR ) 40 MG tablet Take 1 tablet (40 mg total) by mouth at bedtime. 90 tablet 3   timolol  (TIMOPTIC ) 0.25 % ophthalmic solution Place 1 drop into the left eye 2 (two) times daily.     Travoprost, BAK Free, (TRAVATAN) 0.004 % SOLN ophthalmic solution Place 1 drop into both eyes at bedtime.     UNABLE TO FIND at bedtime. CPAP machine     ciprofloxacin  (CIPRO )  500 MG tablet Take 1 tablet (500 mg total) by mouth 2 (two) times daily. 14 tablet 0   fluticasone  (FLONASE ) 50 MCG/ACT nasal spray Place 2 sprays into both nostrils daily. (Patient taking differently: Place 2 sprays into both nostrils as needed.) 16 g 6   hydrocortisone  cream 1 % Apply to affected area 3 times daily 15 g 0   Current Facility-Administered Medications  Medication Dose Route Frequency Provider Last Rate Last Admin   0.9 %  sodium chloride  infusion  500 mL Intravenous Once Avram Lupita BRAVO, MD        Allergies as of 11/17/2023 - Review Complete 11/17/2023  Allergen Reaction Noted   Ivp dye [iodinated contrast media] Hives and Shortness Of Breath 02/13/2013   Latanoprost  Other (See Comments) 03/26/2019   Lisinopril Cough 02/13/2013   Onion Rash 04/03/2019   Sulfa antibiotics Hives 02/13/2013    Family History  Problem Relation Age of Onset   Heart disease Mother    Heart attack Mother    Asthma Mother    Early death Mother    Miscarriages / Stillbirths Mother    Emphysema Mother    Heart disease Father    Lung cancer Father    Diverticulitis Sister    Heart disease Sister    Heart attack Brother    Asthma Brother    Heart disease Brother    Kidney disease Brother    Heart disease Brother    Heart attack Brother    Heart disease Brother    Heart attack Brother    Meniere's disease Brother    Early death Maternal Grandmother    Tuberculosis Maternal Grandmother    Depression Maternal Grandfather    Heart disease Paternal Grandmother    Colon cancer Neg Hx    Esophageal cancer Neg Hx    Rectal cancer Neg Hx    Stomach cancer Neg Hx     Social History   Socioeconomic History   Marital status: Married    Spouse name: Not on file   Number of children: Not on file   Years of education: Not on file   Highest education level: Not on file  Occupational History   Not on file  Tobacco Use   Smoking status: Never    Passive exposure: Past   Smokeless  tobacco: Never  Vaping Use   Vaping status: Never Used  Substance and Sexual Activity   Alcohol use: Not Currently    Comment: Occassional drinker   Drug use: No   Sexual activity: Not Currently  Other Topics Concern   Not on file  Social History Narrative   Marital status/children/pets: Married.   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   Safety:      -smoke alarm in the home:Yes     - wears seatbelt: Yes     - Feels safe in their relationships: Yes   Social Drivers of Corporate investment banker Strain: Low Risk  (07/26/2023)   Overall Financial Resource Strain (CARDIA)    Difficulty of Paying Living Expenses: Not hard at all  Food Insecurity: No Food Insecurity (07/26/2023)   Hunger Vital Sign    Worried About Running Out of Food in the Last Year: Never true    Ran Out of Food in the Last Year: Never true  Transportation Needs: No Transportation Needs (07/26/2023)   PRAPARE - Administrator, Civil Service (Medical): No    Lack of Transportation (Non-Medical): No  Physical Activity: Inactive (07/26/2023)   Exercise Vital Sign    Days of Exercise per Week: 0 days    Minutes of Exercise per Session: 0 min  Stress: No Stress Concern Present (07/26/2023)   [employer] of Occupational Health - Occupational Stress Questionnaire    Feeling of Stress : Not at all  Social Connections: Moderately Integrated (07/26/2023)   Social Connection and Isolation Panel    Frequency of Communication with Friends and Family: More than three times a week    Frequency of Social Gatherings with Friends and Family: Three times a week    Attends Religious Services: More than 4 times per year    Active Member of Clubs or Organizations: No    Attends Banker Meetings: Never    Marital Status: Married  Catering manager Violence: Not At Risk (07/26/2023)   Humiliation, Afraid, Rape, and Kick questionnaire    Fear of Current or Ex-Partner:  No    Emotionally Abused: No    Physically Abused: No    Sexually Abused: No    Review of Systems:  All other review of systems negative except as mentioned in the HPI.  Physical Exam: Vital signs BP (!) 142/72   Pulse (!) 54   Temp 97.7 F (36.5 C) (Skin)   Resp 15   Ht 5' 3 (1.6 m)   Wt 192 lb (87.1 kg)   SpO2 94%   BMI 34.01 kg/m   General:   Alert,  Well-developed, well-nourished, pleasant and cooperative in NAD Lungs:  Clear throughout to auscultation.   Heart:  Regular rate and rhythm; no murmurs, clicks, rubs,  or gallops. Abdomen:  Soft, mild LLQ tendernes and nondistended. Normal bowel sounds.   Neuro/Psych:  Alert and cooperative. Normal mood and affect. A and O x 3   @Anaisha Mago  CHARLENA Commander, MD, [facility] [phone] (pager) 11/17/2023 8:00 AM@

## 2023-11-17 ENCOUNTER — Ambulatory Visit: Admitting: Internal Medicine

## 2023-11-17 ENCOUNTER — Encounter: Payer: Self-pay | Admitting: Internal Medicine

## 2023-11-17 VITALS — BP 130/56 | HR 51 | Temp 97.7°F | Resp 17 | Ht 63.0 in | Wt 192.0 lb

## 2023-11-17 DIAGNOSIS — R1032 Left lower quadrant pain: Secondary | ICD-10-CM

## 2023-11-17 DIAGNOSIS — K5720 Diverticulitis of large intestine with perforation and abscess without bleeding: Secondary | ICD-10-CM

## 2023-11-17 DIAGNOSIS — Z8719 Personal history of other diseases of the digestive system: Secondary | ICD-10-CM

## 2023-11-17 DIAGNOSIS — R194 Change in bowel habit: Secondary | ICD-10-CM

## 2023-11-17 DIAGNOSIS — K5730 Diverticulosis of large intestine without perforation or abscess without bleeding: Secondary | ICD-10-CM

## 2023-11-17 DIAGNOSIS — I493 Ventricular premature depolarization: Secondary | ICD-10-CM | POA: Diagnosis not present

## 2023-11-17 DIAGNOSIS — E119 Type 2 diabetes mellitus without complications: Secondary | ICD-10-CM | POA: Diagnosis not present

## 2023-11-17 DIAGNOSIS — G4730 Sleep apnea, unspecified: Secondary | ICD-10-CM | POA: Diagnosis not present

## 2023-11-17 DIAGNOSIS — I10 Essential (primary) hypertension: Secondary | ICD-10-CM | POA: Diagnosis not present

## 2023-11-17 HISTORY — PX: COLONOSCOPY: SHX174

## 2023-11-17 MED ORDER — HYOSCYAMINE SULFATE ER 0.375 MG PO TB12: 0.3750 mg | ORAL_TABLET | Freq: Two times a day (BID) | ORAL | 5 refills | Status: AC

## 2023-11-17 MED ORDER — SODIUM CHLORIDE 0.9 % IV SOLN: 500.0000 mL | Freq: Once | INTRAVENOUS | Status: DC

## 2023-11-17 NOTE — Op Note (Signed)
 [facility] Patient Name: Carol [name] Procedure Date: 11/17/2023 7:13 AM MRN: [id number] Endoscopist: Lupita FORBES Commander , MD, [id number] Age: 68 Referring MD:  Date of Birth: [birth year] Gender: Female Account #: [id number] Procedure:                Colonoscopy Indications:              Abdominal pain in the left lower quadrant, Change                            in bowel habits - history of divertculitis Medicines:                Monitored Anesthesia Care Procedure:                Pre-Anesthesia Assessment:                           - Prior to the procedure, a History and Physical                            was performed, and patient medications and                            allergies were reviewed. The patient's tolerance of                            previous anesthesia was also reviewed. The risks                            and benefits of the procedure and the sedation                            options and risks were discussed with the patient.                            All questions were answered, and informed consent                            was obtained. Prior Anticoagulants: The patient has                            taken no anticoagulant or antiplatelet agents. ASA                            Grade Assessment: III - A patient with severe                            systemic disease. After reviewing the risks and                            benefits, the patient was deemed in satisfactory                            condition to undergo the procedure.  After obtaining informed consent, the colonoscope                            was passed under direct vision. Throughout the                            procedure, the patient's blood pressure, pulse, and                            oxygen saturations were monitored continuously. The                            Olympus Scope SN: [id number] was introduced through                            the anus and  advanced to the the cecum, identified                            by appendiceal orifice and ileocecal valve. The                            ileocecal valve, appendiceal orifice, and rectum                            were photographed. The colonoscopy was performed                            without difficulty. The patient tolerated the                            procedure well. The quality of the bowel                            preparation was good. The bowel preparation used                            was SUPREP via split dose instruction. Scope In: 8:08:39 AM Scope Out: 8:22:40 AM Scope Withdrawal Time: 0 hours 10 minutes 24 seconds  Total Procedure Duration: 0 hours 14 minutes 1 second  Findings:                 The perianal and digital rectal examinations were                            normal.                           Multiple diverticula were found in the sigmoid                            colon. There was narrowing of the colon in                            association with the diverticular opening.  The exam was otherwise without abnormality on                            direct and retroflexion views. Complications:            No immediate complications. Estimated Blood Loss:     Estimated blood loss: none. Impression:               - Severe diverticulosis in the sigmoid colon. There                            was narrowing of the colon in association with the                            diverticular opening.                           - The examination was otherwise normal on direct                            and retroflexion views.                           - No specimens collected. Recommendation:           - Patient has a contact number available for                            emergencies. The signs and symptoms of potential                            delayed complications were discussed with the                            patient. Return to normal  activities tomorrow.                            Written discharge instructions were provided to the                            patient.                           - Resume previous diet.                           - Continue present medications.                           - No repeat colonoscopy due to current age (68                            years or older) and the absence of colonic polyps.                           - Trial of hyoscyamine  0.375 mg bid  if persistent issues refer to surgery for                            evaluation ? candidate for segmental colectomy Lupita FORBES Commander, MD 11/17/2023 8:35:04 AM This report has been signed electronically.

## 2023-11-17 NOTE — Progress Notes (Signed)
 Pt's states no medical or surgical changes since previsit or office visit.

## 2023-11-17 NOTE — Patient Instructions (Addendum)
 I saw your diverticulosis again but I did not see diverticulitis at this time.  The pain could be from diverticulosis as the muscles in that area can thicken and hypertrophy and cause pain.  I have prescribed a medicine called hyoscyamine  to try taking twice a day to see if it relieves your symptoms.  In some patients with persistent problems I have them see a surgeon to consider resection of the area containing diverticulosis which can relieve the pain.  That is a big step but we can keep that in mind.  No polyps or cancer were found.  I appreciate the opportunity to care for you. Lupita CHARLENA Commander, MD, [facility]  Please see handout regarding Diverticulosis.  YOU HAD AN ENDOSCOPIC PROCEDURE TODAY AT THE [facility]:   Refer to the procedure report that was given to you for any specific questions about what was found during the examination.  If the procedure report does not answer your questions, please call your gastroenterologist to clarify.  If you requested that your care partner not be given the details of your procedure findings, then the procedure report has been included in a sealed envelope for you to review at your convenience later.  YOU SHOULD EXPECT: Some feelings of bloating in the abdomen. Passage of more gas than usual.  Walking can help get rid of the air that was put into your GI tract during the procedure and reduce the bloating. If you had a lower endoscopy (such as a colonoscopy or flexible sigmoidoscopy) you may notice spotting of blood in your stool or on the toilet paper. If you underwent a bowel prep for your procedure, you may not have a normal bowel movement for a few days.  Please Note:  You might notice some irritation and congestion in your nose or some drainage.  This is from the oxygen used during your procedure.  There is no need for concern and it should clear up in a day or so.  SYMPTOMS TO REPORT IMMEDIATELY:  Following lower endoscopy (colonoscopy or  flexible sigmoidoscopy):  Excessive amounts of blood in the stool  Significant tenderness or worsening of abdominal pains  Swelling of the abdomen that is new, acute  Fever of 100F or higher  For urgent or emergent issues, a gastroenterologist can be reached at any hour by calling [phone]. Do not use MyChart messaging for urgent concerns.    DIET:  We do recommend a small meal at first, but then you may proceed to your regular diet.  Drink plenty of fluids but you should avoid alcoholic beverages for 24 hours.  ACTIVITY:  You should plan to take it easy for the rest of today and you should NOT DRIVE or use heavy machinery until tomorrow (because of the sedation medicines used during the test).    FOLLOW UP: Our staff will call the number listed on your records the next business day following your procedure.  We will call around 7:15- 8:00 am to check on you and address any questions or concerns that you may have regarding the information given to you following your procedure. If we do not reach you, we will leave a message.     If any biopsies were taken you will be contacted by phone or by letter within the next 1-3 weeks.  Please call us  at [phone] if you have not heard about the biopsies in 3 weeks.    SIGNATURES/CONFIDENTIALITY: You and/or your care partner have signed paperwork which  will be entered into your electronic medical record.  These signatures attest to the fact that that the information above on your After Visit Summary has been reviewed and is understood.  Full responsibility of the confidentiality of this discharge information lies with you and/or your care-partner.

## 2023-11-17 NOTE — Progress Notes (Signed)
 Pt sedate, gd SR's, VSS, report to RN

## 2023-11-20 ENCOUNTER — Ambulatory Visit (HOSPITAL_BASED_OUTPATIENT_CLINIC_OR_DEPARTMENT_OTHER)
Admission: RE | Admit: 2023-11-20 | Discharge: 2023-11-20 | Disposition: A | Discharge status: Home / Self Care | Source: Ambulatory Visit | Attending: Family Medicine | Admitting: Family Medicine

## 2023-11-20 ENCOUNTER — Encounter (HOSPITAL_BASED_OUTPATIENT_CLINIC_OR_DEPARTMENT_OTHER): Payer: Self-pay | Admitting: Radiology

## 2023-11-20 ENCOUNTER — Other Ambulatory Visit (HOSPITAL_BASED_OUTPATIENT_CLINIC_OR_DEPARTMENT_OTHER): Payer: Self-pay | Admitting: Family Medicine

## 2023-11-20 ENCOUNTER — Telehealth: Payer: Self-pay

## 2023-11-20 DIAGNOSIS — Z1231 Encounter for screening mammogram for malignant neoplasm of breast: Secondary | ICD-10-CM | POA: Diagnosis not present

## 2023-11-20 NOTE — Telephone Encounter (Signed)
  Follow up Call-     11/17/2023    7:10 AM  Call back number  Post procedure Call Back phone  # [phone]  Permission to leave phone message Yes     Patient questions:  Do you have a fever, pain , or abdominal swelling? No. Pain Score  0 *  Have you tolerated food without any problems? Yes.    Have you been able to return to your normal activities? Yes.    Do you have any questions about your discharge instructions: Diet   No. Medications  No. Follow up visit  No.  Do you have questions or concerns about your Care? No.  Actions: * If pain score is 4 or above: No action needed, pain <4.

## 2023-11-21 ENCOUNTER — Ambulatory Visit: Payer: Medicare Other | Attending: Rheumatology | Admitting: Rheumatology

## 2023-11-21 ENCOUNTER — Encounter: Payer: Self-pay | Admitting: Rheumatology

## 2023-11-21 VITALS — BP 129/69 | HR 60 | Resp 16 | Ht 63.0 in | Wt 193.0 lb

## 2023-11-21 DIAGNOSIS — Z840 Family history of diseases of the skin and subcutaneous tissue: Secondary | ICD-10-CM

## 2023-11-21 DIAGNOSIS — K5720 Diverticulitis of large intestine with perforation and abscess without bleeding: Secondary | ICD-10-CM

## 2023-11-21 DIAGNOSIS — M722 Plantar fascial fibromatosis: Secondary | ICD-10-CM | POA: Diagnosis not present

## 2023-11-21 DIAGNOSIS — G8929 Other chronic pain: Secondary | ICD-10-CM

## 2023-11-21 DIAGNOSIS — E1169 Type 2 diabetes mellitus with other specified complication: Secondary | ICD-10-CM

## 2023-11-21 DIAGNOSIS — M19071 Primary osteoarthritis, right ankle and foot: Secondary | ICD-10-CM | POA: Diagnosis not present

## 2023-11-21 DIAGNOSIS — M19072 Primary osteoarthritis, left ankle and foot: Secondary | ICD-10-CM

## 2023-11-21 DIAGNOSIS — I10 Essential (primary) hypertension: Secondary | ICD-10-CM

## 2023-11-21 DIAGNOSIS — R768 Other specified abnormal immunological findings in serum: Secondary | ICD-10-CM | POA: Diagnosis not present

## 2023-11-21 DIAGNOSIS — M533 Sacrococcygeal disorders, not elsewhere classified: Secondary | ICD-10-CM

## 2023-11-21 DIAGNOSIS — R931 Abnormal findings on diagnostic imaging of heart and coronary circulation: Secondary | ICD-10-CM

## 2023-11-21 DIAGNOSIS — M2242 Chondromalacia patellae, left knee: Secondary | ICD-10-CM

## 2023-11-21 DIAGNOSIS — R778 Other specified abnormalities of plasma proteins: Secondary | ICD-10-CM

## 2023-11-21 DIAGNOSIS — R5383 Other fatigue: Secondary | ICD-10-CM

## 2023-11-21 DIAGNOSIS — D508 Other iron deficiency anemias: Secondary | ICD-10-CM

## 2023-11-21 DIAGNOSIS — K219 Gastro-esophageal reflux disease without esophagitis: Secondary | ICD-10-CM

## 2023-11-21 DIAGNOSIS — L408 Other psoriasis: Secondary | ICD-10-CM | POA: Diagnosis not present

## 2023-11-21 DIAGNOSIS — M19042 Primary osteoarthritis, left hand: Secondary | ICD-10-CM

## 2023-11-21 DIAGNOSIS — M19041 Primary osteoarthritis, right hand: Secondary | ICD-10-CM | POA: Diagnosis not present

## 2023-11-21 DIAGNOSIS — M79671 Pain in right foot: Secondary | ICD-10-CM

## 2023-11-21 DIAGNOSIS — E538 Deficiency of other specified B group vitamins: Secondary | ICD-10-CM

## 2023-11-21 DIAGNOSIS — G4733 Obstructive sleep apnea (adult) (pediatric): Secondary | ICD-10-CM

## 2023-11-21 DIAGNOSIS — M79642 Pain in left hand: Secondary | ICD-10-CM

## 2023-11-21 DIAGNOSIS — R21 Rash and other nonspecific skin eruption: Secondary | ICD-10-CM

## 2023-11-21 DIAGNOSIS — E785 Hyperlipidemia, unspecified: Secondary | ICD-10-CM

## 2023-11-21 DIAGNOSIS — I519 Heart disease, unspecified: Secondary | ICD-10-CM | POA: Diagnosis not present

## 2023-11-21 DIAGNOSIS — K580 Irritable bowel syndrome with diarrhea: Secondary | ICD-10-CM

## 2023-11-21 NOTE — Patient Instructions (Signed)
 Hand Exercises Hand exercises can be helpful for almost anyone. They can strengthen your hands and improve flexibility and movement. The exercises can also increase blood flow to the hands. These results can make your work and daily tasks easier for you. Hand exercises can be especially helpful for people who have joint pain from arthritis or nerve damage from using their hands over and over. These exercises can also help people who injure a hand. Exercises Most of these hand exercises are gentle stretching and motion exercises. It is usually safe to do them often throughout the day. Warming up your hands before exercise may help reduce stiffness. You can do this with gentle massage or by placing your hands in warm water for 10-15 minutes. It is normal to feel some stretching, pulling, tightness, or mild discomfort when you begin new exercises. In time, this will improve. Remember to always be careful and stop right away if you feel sudden, very bad pain or your pain gets worse. You want to get better and be safe. Ask your health care provider which exercises are safe for you. Do exercises exactly as told by your provider and adjust them as told. Do not begin these exercises until told by your provider. Knuckle bend or "claw" fist  Stand or sit with your arm, hand, and all five fingers pointed straight up. Make sure to keep your wrist straight. Gently bend your fingers down toward your palm until the tips of your fingers are touching your palm. Keep your big knuckle straight and only bend the small knuckles in your fingers. Hold this position for 10 seconds. Straighten your fingers back to your starting position. Repeat this exercise 5-10 times with each hand. Full finger fist  Stand or sit with your arm, hand, and all five fingers pointed straight up. Make sure to keep your wrist straight. Gently bend your fingers into your palm until the tips of your fingers are touching the middle of your  palm. Hold this position for 10 seconds. Extend your fingers back to your starting position, stretching every joint fully. Repeat this exercise 5-10 times with each hand. Straight fist  Stand or sit with your arm, hand, and all five fingers pointed straight up. Make sure to keep your wrist straight. Gently bend your fingers at the big knuckle, where your fingers meet your hand, and at the middle knuckle. Keep the knuckle at the tips of your fingers straight and try to touch the bottom of your palm. Hold this position for 10 seconds. Extend your fingers back to your starting position, stretching every joint fully. Repeat this exercise 5-10 times with each hand. Tabletop  Stand or sit with your arm, hand, and all five fingers pointed straight up. Make sure to keep your wrist straight. Gently bend your fingers at the big knuckle, where your fingers meet your hand, as far down as you can. Keep the small knuckles in your fingers straight. Think of forming a tabletop with your fingers. Hold this position for 10 seconds. Extend your fingers back to your starting position, stretching every joint fully. Repeat this exercise 5-10 times with each hand. Finger spread  Place your hand flat on a table with your palm facing down. Make sure your wrist stays straight. Spread your fingers and thumb apart from each other as far as you can until you feel a gentle stretch. Hold this position for 10 seconds. Bring your fingers and thumb tight together again. Hold this position for 10 seconds. Repeat  this exercise 5-10 times with each hand. Making circles  Stand or sit with your arm, hand, and all five fingers pointed straight up. Make sure to keep your wrist straight. Make a circle by touching the tip of your thumb to the tip of your index finger. Hold for 10 seconds. Then open your hand wide. Repeat this motion with your thumb and each of your fingers. Repeat this exercise 5-10 times with each hand. Thumb  motion  Sit with your forearm resting on a table and your wrist straight. Your thumb should be facing up toward the ceiling. Keep your fingers relaxed as you move your thumb. Lift your thumb up as high as you can toward the ceiling. Hold for 10 seconds. Bend your thumb across your palm as far as you can, reaching the tip of your thumb for the small finger (pinkie) side of your palm. Hold for 10 seconds. Repeat this exercise 5-10 times with each hand. Grip strengthening  Hold a stress ball or other soft ball in the middle of your hand. Slowly increase the pressure, squeezing the ball as much as you can without causing pain. Think of bringing the tips of your fingers into the middle of your palm. All of your finger joints should bend when doing this exercise. Hold your squeeze for 10 seconds, then relax. Repeat this exercise 5-10 times with each hand. Contact a health care provider if: Your hand pain or discomfort gets much worse when you do an exercise. Your hand pain or discomfort does not improve within 2 hours after you exercise. If you have either of these problems, stop doing these exercises right away. Do not do them again unless your provider says that you can. Get help right away if: You develop sudden, severe hand pain or swelling. If this happens, stop doing these exercises right away. Do not do them again unless your provider says that you can. This information is not intended to replace advice given to you by your health care provider. Make sure you discuss any questions you have with your health care provider. Document Revised: 04/12/2022 Document Reviewed: 04/12/2022 Elsevier Patient Education  2024 [employer] Exercises for Chronic Knee Pain Chronic knee pain is pain that lasts longer than 3 months. For most people with chronic knee pain, exercise and weight loss is an important part of treatment. Your health care provider may want you to focus on: Making the muscles that  support your knee stronger. This can take pressure off your knee and reduce pain. Preventing knee stiffness. How far you can move your knee, keeping it there or making it farther. Losing weight (if this applies) to take pressure off your knee, lower your risk for injury, and make it easier for you to exercise. Your provider will help you make an exercise program that fits your needs and physical abilities. Below are simple, low-impact exercises you can do at home. Ask your provider or physical therapist how often you should do your exercise program and how many times to repeat each exercise. General safety tips  Get your provider's approval before doing any exercises. Start slowly and stop any time you feel pain. Do not exercise if your knee pain is flaring up. Warm up first. Stretching a cold muscle can cause an injury. Do 5-10 minutes of easy movement or light stretching before beginning your exercises. Do 5-10 minutes of low-impact activity (like walking or cycling) before starting strengthening exercises. Contact your provider any time you have pain during  or after exercising. Exercise can cause discomfort but should not be painful. It is normal to be a little stiff or sore after exercising. Stretching and range-of-motion exercises Front thigh stretch  Stand up straight and support your body by holding on to a chair or resting one hand on a wall. With your legs straight and close together, bend one knee to lift your heel up toward your butt. Using one hand for support, grab your ankle with your free hand. Pull your foot up closer toward your butt to feel the stretch in front of your thigh. Hold the stretch for 30 seconds. Repeat __________ times. Complete this exercise __________ times a day. Back thigh stretch  Sit on the floor with your back straight and your legs out straight in front of you. Place the palms of your hands on the floor and slide them toward your feet as you bend at the  hip. Try to touch your nose to your knees and feel the stretch in the back of your thighs. Hold for 30 seconds. Repeat __________ times. Complete this exercise __________ times a day. Calf stretch  Stand facing a wall. Place the palms of your hands flat against the wall, arms extended, and lean slightly against the wall. Get into a lunge position with one leg bent at the knee and the other leg stretched out straight behind you. Keep both feet facing the wall and increase the bend in your knee while keeping the heel of the other leg flat on the ground. You should feel the stretch in your calf. Hold for 30 seconds. Repeat __________ times. Complete this exercise __________ times a day. Strengthening exercises Straight leg lift  Lie on your back with one knee bent and the other leg out straight. Slowly lift the straight leg without bending the knee. Lift until your foot is about 12 inches (30 cm) off the floor. Hold for 3-5 seconds and slowly lower your leg. Repeat __________ times. Complete this exercise __________ times a day. Single leg dip  Stand between two chairs and put both hands on the backs of the chairs for support. Extend one leg out straight with your body weight resting on the heel of the standing leg. Slowly bend your standing knee to dip your body to the level that is comfortable for you. Hold for 3-5 seconds. Repeat __________ times. Complete this exercise __________ times a day. Hamstring curls  Stand straight, knees close together, facing the back of a chair. Hold on to the back of a chair with both hands. Keep one leg straight. Bend the other knee while bringing the heel up toward the butt until the knee is bent at a 90-degree angle (right angle). Hold for 3-5 seconds. Repeat __________ times. Complete this exercise __________ times a day. Wall squat  Stand straight with your back, hips, and head against a wall. Step forward one foot at a time with your back  still against the wall. Your feet should be 2 feet (61 cm) from the wall at shoulder width. Keeping your back, hips, and head against the wall, slide down the wall to as close to a sitting position as you can get. Hold for 5-10 seconds, then slowly slide back up. Repeat __________ times. Complete this exercise __________ times a day. Step-ups  Stand in front of a sturdy platform or stool that is about 6 inches (15 cm) high. Slowly step up with your left / right foot, keeping your knee in line with your hip  and foot. Do not let your knee bend so far that you cannot see your toes. Hold on to a chair for balance, but do not use it for support. Slowly unlock your knee and lower yourself to the starting position. Repeat __________ times. Complete this exercise __________ times a day. Contact a health care provider if: Your exercises cause pain. Your pain is worse after you exercise. Your pain prevents you from doing your exercises. This information is not intended to replace advice given to you by your health care provider. Make sure you discuss any questions you have with your health care provider. Document Revised: 04/12/2022 Document Reviewed: 04/12/2022 Elsevier Patient Education  2024 [employer].

## 2023-11-22 ENCOUNTER — Ambulatory Visit: Payer: Self-pay | Admitting: Family Medicine

## 2023-12-05 ENCOUNTER — Telehealth: Payer: Self-pay

## 2023-12-05 ENCOUNTER — Other Ambulatory Visit: Payer: Self-pay | Admitting: Family Medicine

## 2023-12-05 NOTE — Telephone Encounter (Signed)
 Received CMN from [facility]. Placed on Katie's desk in B pod for signature. Will fax once signed.

## 2023-12-05 NOTE — Telephone Encounter (Signed)
 Received second CMN from [employer]. Placed CMN on Katie's desk in B pod for signature. Will fax once signed.

## 2023-12-06 ENCOUNTER — Ambulatory Visit (INDEPENDENT_AMBULATORY_CARE_PROVIDER_SITE_OTHER): Admitting: Family Medicine

## 2023-12-06 VITALS — BP 118/58 | HR 78 | Temp 97.8°F | Wt 194.2 lb

## 2023-12-06 DIAGNOSIS — G4733 Obstructive sleep apnea (adult) (pediatric): Secondary | ICD-10-CM

## 2023-12-06 DIAGNOSIS — D508 Other iron deficiency anemias: Secondary | ICD-10-CM

## 2023-12-06 DIAGNOSIS — K219 Gastro-esophageal reflux disease without esophagitis: Secondary | ICD-10-CM | POA: Diagnosis not present

## 2023-12-06 DIAGNOSIS — R778 Other specified abnormalities of plasma proteins: Secondary | ICD-10-CM | POA: Diagnosis not present

## 2023-12-06 DIAGNOSIS — R931 Abnormal findings on diagnostic imaging of heart and coronary circulation: Secondary | ICD-10-CM

## 2023-12-06 DIAGNOSIS — I10 Essential (primary) hypertension: Secondary | ICD-10-CM

## 2023-12-06 DIAGNOSIS — E538 Deficiency of other specified B group vitamins: Secondary | ICD-10-CM | POA: Diagnosis not present

## 2023-12-06 DIAGNOSIS — Z7984 Long term (current) use of oral hypoglycemic drugs: Secondary | ICD-10-CM

## 2023-12-06 DIAGNOSIS — N951 Menopausal and female climacteric states: Secondary | ICD-10-CM

## 2023-12-06 DIAGNOSIS — E669 Obesity, unspecified: Secondary | ICD-10-CM | POA: Diagnosis not present

## 2023-12-06 DIAGNOSIS — E1169 Type 2 diabetes mellitus with other specified complication: Secondary | ICD-10-CM

## 2023-12-06 DIAGNOSIS — E785 Hyperlipidemia, unspecified: Secondary | ICD-10-CM | POA: Diagnosis not present

## 2023-12-06 LAB — POCT GLYCOSYLATED HEMOGLOBIN (HGB A1C)
HbA1c POC (<> result, manual entry): 6.4 % (ref 4.0–5.6)
HbA1c, POC (controlled diabetic range): 6.4 % (ref 0.0–7.0)
HbA1c, POC (prediabetic range): 6.4 % (ref 5.7–6.4)
Hemoglobin A1C: 6.4 % — AB (ref 4.0–5.6)

## 2023-12-06 MED ORDER — OMEPRAZOLE 20 MG PO CPDR: 20.0000 mg | DELAYED_RELEASE_CAPSULE | Freq: Every day | ORAL | 1 refills | Status: AC

## 2023-12-06 MED ORDER — METOPROLOL SUCCINATE ER 25 MG PO TB24: 25.0000 mg | ORAL_TABLET | Freq: Every day | ORAL | 1 refills | Status: AC

## 2023-12-06 MED ORDER — CITALOPRAM HYDROBROMIDE 20 MG PO TABS: 20.0000 mg | ORAL_TABLET | Freq: Every day | ORAL | 1 refills | Status: AC

## 2023-12-06 MED ORDER — METFORMIN HCL ER 750 MG PO TB24: 750.0000 mg | ORAL_TABLET | Freq: Every day | ORAL | 1 refills | Status: AC

## 2023-12-06 MED ORDER — CANDESARTAN CILEXETIL-HCTZ 16-12.5 MG PO TABS: 1.0000 | ORAL_TABLET | Freq: Every day | ORAL | 1 refills | Status: AC

## 2023-12-06 MED ORDER — ROSUVASTATIN CALCIUM 40 MG PO TABS: 40.0000 mg | ORAL_TABLET | Freq: Every day | ORAL | 3 refills | Status: AC

## 2023-12-06 MED ORDER — EMPAGLIFLOZIN 25 MG PO TABS: 25.0000 mg | ORAL_TABLET | Freq: Every day | ORAL | 1 refills | Status: AC

## 2023-12-06 NOTE — Progress Notes (Unsigned)
 Carol [name] , [birth year], 68 y.o., female MRN: [id number] Patient Care Team    Relationship Specialty Notifications Start End  Catherine Charlies LABOR, DO PCP - General Family Medicine  06/10/19   Jeffrie Oneil BROCKS, MD Consulting Physician Cardiology  06/12/19   Avram Lupita BRAVO, MD Consulting Physician Gastroenterology  06/12/19   Maree Lonni Inks, MD Consulting Physician Ophthalmology  06/12/19   Sharman Doffing, MD Referring Physician Obstetrics and Gynecology  06/12/19   Maurilio Camellia PARAS, MD Consulting Physician Allergy and Immunology  06/12/19   [facility]  07/26/21   Zan Factor, DPM Consulting Physician Podiatry  05/10/23     Chief Complaint  Patient presents with   Diabetes    Chronic Conditions/illness Management Eyye exam schedule 10/13     Subjective: Carol [name] is a 68 y.o. Pt presents for for fatigue and Chronic Conditions/illness Management  Need eye recs foot  Fatigue: Patient presented in October with complaints of fatigue and she was encouraged to increase her iron , start B12 supplement, ensure adequate vitamin D  levels.  Lab work was collected that day with borderline iron  levels, low B12, and mildly low vitamin D . Patient reports today she has noticed an increase in her fatigue again in the last 2-3 weeks.  Wearing yellow easy/low stamina throughout the day.  Noticing even the smallest amount of activities causing her to feel worn out.  She denies chest pain, palpitations or dyspnea.  She has had no recent dizziness episodes. Prior note:  an OV with complaints of fatigue worsening over the last 2-3 months duration.  Associated symptoms include never feeling rested.  She reports she can sleep 6 to 16 hours a night and would not not wake up feeling rested.  When she wakes up she takes a shower and starts her day and is already ready for a nap.  She is uncertain if she snores at nighttime.  She does fall asleep sitting in the chair on  occasions and will wake herself snoring.  2 of her sisters have sleep apnea. BMI >30. She is a diabetic and also treated for hypertension.  She reports her sugars have been elevated over 160 routinely recently with out identifiable cause.  Her diet has not changed. She has had neuropathy in which she is now seeing a podiatrist who is prescribing her neuremedy-which is medical grade thiamine .  She has noticed an improvement in her feet discomfort since starting medication 8 weeks ago.  She has had recurrent UTIs, flares of irritable bowel  Type 2 diabetes mellitus without complication, without long-term current use of insulin  (HCC)/obesity Pt reports compliance with metformin  750 mg daily and tradjenta  5 mg QD.  Patient denies dizziness, hyperglycemic or hypoglycemic events. Patient denies numbness, tingling in the extremities or nonhealing wounds of feet.    Essential hypertension/HTN Pt reports compliance with Toprol -XL 25, candesartan -HCTZ 16-12.5 mg daily, Crestor  40 mg daily and baby aspirin . Patient denies chest pain, shortness of breath, dizziness or lower extremity edema.  RF: Hypertension, hyperlipidemia, diabetic, obesity, family history.    Gastroesophageal reflux disease without esophagitis Patient reports symptoms are well-controlled on  her omeprazole  20 mg daily.  She has been unable to discontinue medication in the past without recurrence of symptoms immediately.   Hot flashes: Patient reports celexa  20 mg qd is very helpful  Iron  deficiency anemia: Continues the iron  supplement every other day.   Hypercalcemia: Patient was found to have very  mild hypercalcemia of 10.5, 10.4 is normal.  She was encouraged to hydrate and avoid any added calcium ..        07/26/2023    9:40 AM 02/13/2023    1:19 PM 09/02/2022    8:53 AM 08/02/2022    2:23 PM 07/13/2022    9:33 AM  Depression screen PHQ 2/9  Decreased Interest 0 1 0 0 0  Down, Depressed, Hopeless 0 0 0 0 0  PHQ - 2 Score 0 1 0  0 0  Altered sleeping 0 0 0    Tired, decreased energy 0 3 0    Change in appetite 0 0 0    Feeling bad or failure about yourself  0 0 0    Trouble concentrating 0 0 0    Moving slowly or fidgety/restless 0 0 0    Suicidal thoughts 0 0 0    PHQ-9 Score 0 4 0    Difficult doing work/chores Not difficult at all Not difficult at all Not difficult at all      Allergies  Allergen Reactions   Ivp Dye [Iodinated Contrast Media] Hives and Shortness Of Breath    Did ok in OP setting with 13 hr Premedications    Latanoprost  Other (See Comments)   Lisinopril Cough   Onion Rash    Rash and GI upset   Sulfa Antibiotics Hives   Social History   Social History Narrative   Marital status/children/pets: Married.   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   Safety:      -smoke alarm in the home:Yes     - wears seatbelt: Yes     - Feels safe in their relationships: Yes   Past Medical History:  Diagnosis Date   Angio-edema    Blood type O+    Chest discomfort    , Atypical   Chicken pox    COVID-19 05/09/2019   Diabetic acidosis, type II (HCC)    Diverticulitis - large vs small bowel vs both?    Gastroesophageal reflux disease    Glaucoma    Hyperlipidemia    Hypertension    Plantar fasciitis, bilateral 2015   PVC (premature ventricular contraction)    Sleep apnea 07/2023   Systolic dysfunction    , Hyperdynamic   Past Surgical History:  Procedure Laterality Date   APPENDECTOMY N/A 11/05/2013   Procedure: APPENDECTOMY;  Surgeon: Lynwood MALVA Pina, MD;  Location: [facility] OR;  Service: General;  Laterality: N/A;   BOWEL RESECTION N/A 11/05/2013   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  Normal performed by Dr. Annis   COLONOSCOPY  04/03/2019   COLONOSCOPY  11/17/2023   LAPAROTOMY N/A 11/05/2013   Procedure: EXPLORATORY LAPAROTOMY;  Surgeon: Lynwood MALVA Pina, MD;  Location: [facility]  OR;  Service: General;  Laterality: N/A;   Lexiscan  2010   Patient reports normal Lexiscan completed around 2010 by Dr. Annis.   TOTAL ABDOMINAL HYSTERECTOMY  2004   Family History  Problem Relation Age of Onset   Heart disease Mother    Heart attack Mother    Asthma Mother    Early death Mother    Miscarriages / Stillbirths Mother    Emphysema Mother    Heart disease Father    Lung cancer Father    Diverticulitis Sister    Heart disease Sister    Heart attack Brother    Asthma Brother  Heart disease Brother    Kidney disease Brother    Heart disease Brother    Heart attack Brother    Heart disease Brother    Heart attack Brother    Meniere's disease Brother    Early death Maternal Grandmother    Tuberculosis Maternal Grandmother    Depression Maternal Grandfather    Heart disease Paternal Grandmother    Colon cancer Neg Hx    Esophageal cancer Neg Hx    Rectal cancer Neg Hx    Stomach cancer Neg Hx    Allergies as of 12/06/2023       Reactions   Ivp Dye [iodinated Contrast Media] Hives, Shortness Of Breath   Did ok in OP setting with 13 hr Premedications    Latanoprost  Other (See Comments)   Lisinopril Cough   Onion Rash   Rash and GI upset   Sulfa Antibiotics Hives        Medication List        Accurate as of December 06, 2023  2:37 PM. If you have any questions, ask your nurse or doctor.          Advil 200 MG tablet Generic drug: ibuprofen Take 200 mg by mouth as needed.   aspirin  81 MG tablet Take 81 mg by mouth at bedtime.   candesartan -hydrochlorothiazide 16-12.5 MG tablet Commonly known as: ATACAND  HCT Take 1 tablet by mouth daily.   cetirizine 10 MG tablet Commonly known as: ZYRTEC Take 10 mg by mouth at bedtime.   citalopram  20 MG tablet Commonly known as: CELEXA  Take 1 tablet (20 mg total) by mouth daily.   empagliflozin  25 MG Tabs tablet Commonly known as: Jardiance  Take 1 tablet (25 mg total) by mouth daily.   Ferrex 150  150 MG capsule Generic drug: iron  polysaccharides Take 1 capsule (150 mg total) by mouth daily.   fluocinonide  ointment 0.05 % Commonly known as: LIDEX  Apply 1 Application topically 2 (two) times daily. What changed:  when to take this reasons to take this   fluticasone  50 MCG/ACT nasal spray Commonly known as: FLONASE  Place 2 sprays into both nostrils daily. What changed:  when to take this reasons to take this   hydrocortisone  cream 1 % Apply to affected area 3 times daily   hyoscyamine  0.375 MG 12 hr tablet Commonly known as: LEVBID  Take 1 tablet (0.375 mg total) by mouth 2 (two) times daily. What changed:  when to take this reasons to take this   metFORMIN  750 MG 24 hr tablet Commonly known as: GLUCOPHAGE -XR Take 1 tablet (750 mg total) by mouth daily with breakfast.   metoprolol  succinate 25 MG 24 hr tablet Commonly known as: TOPROL -XL Take 1 tablet (25 mg total) by mouth daily. Take with or immediately following a meal. Needs OV for refills   omeprazole  20 MG capsule Commonly known as: PRILOSEC Take 1 capsule (20 mg total) by mouth daily.   ondansetron  4 MG tablet Commonly known as: ZOFRAN  Take 1 tablet 20 minutes before drinking colonoscopy prep.   OneTouch Ultra Test test strip Generic drug: glucose blood 2 (two) times daily.   rosuvastatin  40 MG tablet Commonly known as: CRESTOR  Take 1 tablet (40 mg total) by mouth at bedtime.   timolol  0.25 % ophthalmic solution Commonly known as: TIMOPTIC  Place 1 drop into the left eye 2 (two) times daily.   Travoprost (BAK Free) 0.004 % Soln ophthalmic solution Commonly known as: TRAVATAN Place 1 drop into both eyes at bedtime.  UNABLE TO FIND at bedtime. CPAP machine   vitamin C 1000 MG tablet Take 1,000 mg by mouth daily.        All past medical history, surgical history, allergies, family history, immunizations andmedications were updated in the EMR today and reviewed under the history and  medication portions of their EMR.     ROS Negative, with the exception of above mentioned in HPI   Objective:  BP (!) 118/58   Pulse 78   Temp 97.8 F (36.6 C)   Wt 194 lb 3.2 oz (88.1 kg)   SpO2 97%   BMI 34.40 kg/m  Body mass index is 34.4 kg/m. Physical Exam Vitals and nursing note reviewed.  Constitutional:      General: She is not in acute distress.    Appearance: Normal appearance. She is obese. She is not ill-appearing, toxic-appearing or diaphoretic.  HENT:     Head: Normocephalic and atraumatic.  Eyes:     General: No scleral icterus.       Right eye: No discharge.        Left eye: No discharge.     Extraocular Movements: Extraocular movements intact.     Conjunctiva/sclera: Conjunctivae normal.     Pupils: Pupils are equal, round, and reactive to light.  Cardiovascular:     Rate and Rhythm: Normal rate and regular rhythm.     Heart sounds: Murmur heard.  Pulmonary:     Effort: Pulmonary effort is normal. No respiratory distress.     Breath sounds: Normal breath sounds. No wheezing, rhonchi or rales.  Musculoskeletal:     Right lower leg: No edema.     Left lower leg: No edema.  Skin:    General: Skin is warm.     Findings: No rash.  Neurological:     Mental Status: She is alert and oriented to person, place, and time. Mental status is at baseline.     Motor: No weakness.     Gait: Gait normal.  Psychiatric:        Mood and Affect: Mood normal.        Behavior: Behavior normal.        Thought Content: Thought content normal.        Judgment: Judgment normal.     No results found. No results found. Results for orders placed or performed in visit on 12/06/23 (from the past 24 hours)  POCT HgB A1C     Status: Abnormal   Collection Time: 12/06/23  2:25 PM  Result Value Ref Range   Hemoglobin A1C 6.4 (A) 4.0 - 5.6 %   HbA1c POC (<> result, manual entry) 6.4 4.0 - 5.6 %   HbA1c, POC (prediabetic range) 6.4 5.7 - 6.4 %   HbA1c, POC (controlled  diabetic range) 6.4 0.0 - 7.0 %    Assessment/Plan: ANU STAGNER is a 68 y.o. female present for OV for  iron  deficiency anemia Currently supplementing*every other day with Ferrex 150> iron  panel still borderline today, encouraged her to increase iron  supplementation to 5 days a week  Fatigue Fatigue onset greater than 9 months now. Normal pursue further workup for her fatigue.      -Complaints of daytime somnolence, snoring and obesity referral to sleep clinic for sleep apnea eval placed today.    -Complaints of decreased stamina, wearing out after activity.  Easily excetra will ensure screening for cardiac CT is placed for her.  She is asymptomatic as far as chest pain or shortness  of breath. Consider repeating echo and ordering a ZIO monitor as next step. -SPEP and ANA with reflex panel added today Prior workup: - CBC w/Diff-mild elevation in WBC, repeat today-leukocytosis - TSH-within normal limits 01/2023, TPO normal - Comp Met (CMET) within normal limits 01/2023.  History of hypercalcemia, that remains in normal range when she is hydrating well. - Vitamin D  (25 hydroxy) insufficiency-mildly lower than normal (28) - B12 (deficiency) and Folate Panel-folate normal, B12 deficient 01/2023-started B12 injections and then sublingual - IBC + Ferritin-borderline iron  levels, borderline ferritin, low saturations with supplement -ANA, ESR, SPEP, PTH  Neuropathy Continue B12-repeat B12/folate levels today Continue high-dose thiamine  by specialty team it seems to be helping with her neuropathy  Type 2 diabetes mellitus with hyperlipidemia (HCC)/morbid obesity Stable Continue metformin  750 mg daily A1c elevated to 7.4 today.  Discontinue Tradjenta  and start Jardiance  25 mg daily. Continue Crestor  40 mg daily Diabetic diet and routine exercise recommended PNA series: Pneumonia series completed Flu shot: Up-to-date 2024 (recommneded yearly) Foot exam:  Completed 05/10/2023 Eye exam:  Up-to-date 02/06/2023 Dr. Maree- record requested A1c: 6.9> 6.1>7.2>5.8 > 6.4 >6.3 >6.2> 6.7> 5.7 > 7.4 >6.4 A1c collected today Urine microalbumin collected today 12/06/2023   Essential hypertension/HLD/obesity Stable Continue  metoprolol  XL at a decreased dose of half tab daily (25 mg dose) Continue candesartan /HCTZ 16-12.5 mg Continue Crestor  40 mg daily Continue baby aspirin  daily  Gastroesophageal reflux disease without esophagitis Stable Continue omeprazole  20 mg daily   Hot flashes: Stable Continue Celexa  20 mg daily  Hypercalcemia She is continuing to hydrate.  Has been stable   Reviewed expectations re: course of current medical issues. Discussed self-management of symptoms. Outlined signs and symptoms indicating need for more acute intervention. Patient verbalized understanding and all questions were answered. Patient received an After-Visit Summary.    Orders Placed This Encounter  Procedures   Comp Met (CMET)   Urine Microalbumin w/creat. ratio   IBC + Ferritin   CBC w/Diff   POCT HgB A1C   Meds ordered this encounter  Medications   candesartan -hydrochlorothiazide (ATACAND  HCT) 16-12.5 MG tablet    Sig: Take 1 tablet by mouth daily.    Dispense:  90 tablet    Refill:  1   citalopram  (CELEXA ) 20 MG tablet    Sig: Take 1 tablet (20 mg total) by mouth daily.    Dispense:  90 tablet    Refill:  1   empagliflozin  (JARDIANCE ) 25 MG TABS tablet    Sig: Take 1 tablet (25 mg total) by mouth daily.    Dispense:  90 tablet    Refill:  1   metFORMIN  (GLUCOPHAGE -XR) 750 MG 24 hr tablet    Sig: Take 1 tablet (750 mg total) by mouth daily with breakfast.    Dispense:  90 tablet    Refill:  1   metoprolol  succinate (TOPROL -XL) 25 MG 24 hr tablet    Sig: Take 1 tablet (25 mg total) by mouth daily. Take with or immediately following a meal. Needs OV for refills    Dispense:  90 tablet    Refill:  1   omeprazole  (PRILOSEC) 20 MG capsule    Sig: Take 1 capsule (20  mg total) by mouth daily.    Dispense:  90 capsule    Refill:  1   rosuvastatin  (CRESTOR ) 40 MG tablet    Sig: Take 1 tablet (40 mg total) by mouth at bedtime.    Dispense:  90 tablet    Refill:  3   Referral Orders  No referral(s) requested today     50 minutes spent in patient encounter covering multiple chronic conditions, worsening fatigue ordering labs imaging studies.  Note is dictated utilizing voice recognition software. Although note has been proof read prior to signing, occasional typographical errors still can be missed. If any questions arise, please do not hesitate to call for verification.   electronically signed by:  Charlies Bellini, DO  [facility] - OR

## 2023-12-06 NOTE — Patient Instructions (Addendum)

## 2023-12-07 ENCOUNTER — Encounter: Payer: Self-pay | Admitting: Family Medicine

## 2023-12-07 LAB — CBC WITH DIFFERENTIAL/PLATELET
Basophils Absolute: 0.1 K/uL (ref 0.0–0.1)
Basophils Relative: 1 % (ref 0.0–3.0)
Eosinophils Absolute: 0.5 K/uL (ref 0.0–0.7)
Eosinophils Relative: 4.3 % (ref 0.0–5.0)
HCT: 39.2 % (ref 36.0–46.0)
Hemoglobin: 12.4 g/dL (ref 12.0–15.0)
Lymphocytes Relative: 36.8 % (ref 12.0–46.0)
Lymphs Abs: 4.2 K/uL — ABNORMAL HIGH (ref 0.7–4.0)
MCHC: 31.7 g/dL (ref 30.0–36.0)
MCV: 83.2 fl (ref 78.0–100.0)
Monocytes Absolute: 0.8 K/uL (ref 0.1–1.0)
Monocytes Relative: 6.9 % (ref 3.0–12.0)
Neutro Abs: 5.8 K/uL (ref 1.4–7.7)
Neutrophils Relative %: 51 % (ref 43.0–77.0)
Platelets: 421 K/uL — ABNORMAL HIGH (ref 150.0–400.0)
RBC: 4.71 Mil/uL (ref 3.87–5.11)
RDW: 16.6 % — ABNORMAL HIGH (ref 11.5–15.5)
WBC: 11.4 K/uL — ABNORMAL HIGH (ref 4.0–10.5)

## 2023-12-07 LAB — COMPREHENSIVE METABOLIC PANEL WITH GFR
ALT: 16 U/L (ref 0–35)
AST: 18 U/L (ref 0–37)
Albumin: 4.4 g/dL (ref 3.5–5.2)
Alkaline Phosphatase: 76 U/L (ref 39–117)
BUN: 23 mg/dL (ref 6–23)
CO2: 30 meq/L (ref 19–32)
Calcium: 10.3 mg/dL (ref 8.4–10.5)
Chloride: 100 meq/L (ref 96–112)
Creatinine, Ser: 0.8 mg/dL (ref 0.40–1.20)
GFR: 75.52 mL/min (ref >60.00)
Glucose, Bld: 108 mg/dL — ABNORMAL HIGH (ref 70–99)
Potassium: 4.4 meq/L (ref 3.5–5.1)
Sodium: 140 meq/L (ref 135–145)
Total Bilirubin: 0.3 mg/dL (ref 0.2–1.2)
Total Protein: 7.6 g/dL (ref 6.0–8.3)

## 2023-12-07 LAB — IBC + FERRITIN
Ferritin: 27.8 ng/mL (ref 10.0–291.0)
Iron: 65 ug/dL (ref 42–145)
Saturation Ratios: 14.7 % — ABNORMAL LOW (ref 20.0–50.0)
TIBC: 442.4 ug/dL (ref 250.0–450.0)
Transferrin: 316 mg/dL (ref 212.0–360.0)

## 2023-12-07 LAB — MICROALBUMIN / CREATININE URINE RATIO
Creatinine,U: 40.3 mg/dL
Microalb Creat Ratio: 21.6 mg/g (ref 0.0–30.0)
Microalb, Ur: 0.9 mg/dL (ref 0.0–1.9)

## 2023-12-07 MED ORDER — FERREX 150 150 MG PO CAPS: 150.0000 mg | ORAL_CAPSULE | Freq: Every day | ORAL | 3 refills | Status: AC

## 2023-12-08 ENCOUNTER — Telehealth: Payer: Self-pay | Admitting: Student

## 2023-12-08 ENCOUNTER — Ambulatory Visit: Payer: Self-pay | Admitting: Family Medicine

## 2023-12-08 NOTE — Telephone Encounter (Signed)
 Faxed both signed CMNs to [employer] at [phone] Received both confirmations. NFN

## 2023-12-08 NOTE — Telephone Encounter (Signed)
 Cmn was already placed on Carol [name]'s desk for signature.

## 2023-12-08 NOTE — Telephone Encounter (Signed)
 Copied from CRM [id number]. Topic: Medical Record Request - Other >> Dec 08, 2023 10:37 AM Leila BROCKS wrote: Reason for CRM: Marty from [facility] [phone] needs status for Cpap supplies order faxed 12/05/23 for NP, Cobb to sign. Please advise and call back.

## 2023-12-13 ENCOUNTER — Telehealth: Payer: Self-pay

## 2023-12-13 NOTE — Telephone Encounter (Signed)
 Communication  Reason for CRM: Pt believes she has a UTI that began Monda. Pt did home test and it is positive. Her sx are pain, pressure and  increased frequency. Pt would like PCP to send rx to pharmacy. Soonest available appt Friday with PCP.   Pt needs appt. MyChart msg sent.

## 2023-12-14 ENCOUNTER — Encounter: Payer: Self-pay | Admitting: Nurse Practitioner

## 2023-12-14 ENCOUNTER — Ambulatory Visit (INDEPENDENT_AMBULATORY_CARE_PROVIDER_SITE_OTHER): Admitting: Nurse Practitioner

## 2023-12-14 VITALS — BP 103/65 | HR 58 | Ht 63.0 in | Wt 194.6 lb

## 2023-12-14 DIAGNOSIS — G4733 Obstructive sleep apnea (adult) (pediatric): Secondary | ICD-10-CM

## 2023-12-14 NOTE — Progress Notes (Signed)
 @Patient  ID: Carol [name], female    DOB: 1955/11/17, 68 y.o.   MRN: [id number]  Chief Complaint  Patient presents with   Sleep Apnea    Referring provider: Catherine Charlies LABOR, DO  HPI: 68 year old female, never smoker followed for severe OSA. Past medical history significant for HTN, GERD, IBS, DM, HLD, obesity, diastolic dysfunction.    TEST/EVENTS:   07/10/2023 HST: AHI 30.7/h, SpO2 low 79%   06/28/2023: OV with Akai Dollard NP Discussed the use of AI scribe software for clinical note transcription with the patient, who gave verbal consent to proceed. History of Present Illness   Carol [name] is a 68 year old female who presents with daytime fatigue and suspected sleep apnea. She experiences significant daytime fatigue and morning grogginess. She wakes up not feeling well-rested. She snores when she falls asleep in a chair with her head down, waking herself up, but is unsure about snoring at night. Her niece has noted shallow breathing during sleep. No history of drowsy driving, morning headaches, sleepwalking, or narcolepsy. She has experienced sleep paralysis in the past but not recently. She reports nasal congestion at night, which sometimes requires her to get up and blow her nose. She uses a nasal spray, Flonase , before bed if she feels stuffy, which she finds helpful.  She does not take any sleep medications and typically reads for about an hour before falling asleep. She wakes up one to two times a night to use the bathroom. Goes to bed around 8-9 pm. Gets up around 6 am. No weight change over last 2 years. Never had a previous sleep study. No supplemental oxygen use. 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  She consumes about one alcoholic beverage a month, if any, and tries to avoid caffeine. She works as a [employer] Services operational officer. Lives with her husband. Family history of asthma, heart disease, father with lung cancer. [place] 13   08/18/2023: OV with Marlys Stegmaier NP Patient presents today for  follow-up to review home sleep study which revealed severe sleep apnea.  She feels unchanged compared to her last visit.  Has issues with significant daytime sleepiness and morning grogginess.  Does not feel well rested.  Has loud snoring.  Denies any issues with drowsy driving.  Here to discuss potential treatment options  12/14/2023: Today - follow up Discussed the use of AI scribe software for clinical note transcription with the patient, who gave verbal consent to proceed.  History of Present Illness Carol [name] is a 68 year old female with sleep apnea who presents for a follow-up regarding CPAP therapy.  She feels better overall with CPAP therapy, though she still experiences occasional daytime sleepiness. Her energy levels and sleep quality have improved, with more restful sleep at night and fewer awakenings. No drowsy driving, sleep parasomnias, morning headaches.   She uses a full face mask.   No frequent awakenings at night and no issues with air pressure from the CPAP machine.  11/13/2023-12/12/2023: CPAP 5-15 cmH2O 30/30 days; 100% >4 hr; average use 8 hr 54 min Pressure 95th 12 Leaks 95th 6.8 AHI 4.1    Allergies  Allergen Reactions   Ivp Dye [Iodinated Contrast Media] Hives and Shortness Of Breath    Did ok in OP setting with 13 hr Premedications    Latanoprost  Other (See Comments)   Lisinopril Cough   Onion Rash    Rash and GI upset   Sulfa Antibiotics Hives    Immunization History  Administered Date(s) Administered   Fluad Quad(high Dose 65+) 02/21/2020, 01/06/2021, 03/02/2022, 12/22/2022   Influenza,inj,Quad PF,6+ Mos 03/30/2019   PFIZER(Purple Top)SARS-COV-2 Vaccination 07/02/2019, 07/30/2019, 01/19/2020   Pfizer Covid-19 Vaccine Bivalent Booster 47yrs & up 02/12/2021   Pneumococcal Conjugate-13 03/11/2020   Pneumococcal Polysaccharide-23 03/24/2021   Tdap 08/27/2012, 12/22/2022   Zoster Recombinant(Shingrix) 11/05/2019, 02/03/2020    Past Medical History:   Diagnosis Date   Angio-edema    Blood type O+    Chest discomfort    , Atypical   Chicken pox    COVID-19 05/09/2019   Diabetic acidosis, type II (HCC)    Diverticulitis - large vs small bowel vs both?    Gastroesophageal reflux disease    Glaucoma    Hyperlipidemia    Hypertension    LLQ abdominal pain 09/26/2023   Plantar fasciitis, bilateral 2015   PVC (premature ventricular contraction)    Sleep apnea 07/2023   Systolic dysfunction    , Hyperdynamic    Tobacco History: Social History   Tobacco Use  Smoking Status Never   Passive exposure: Past  Smokeless Tobacco Never   Counseling given: Not Answered   Outpatient Medications Prior to Visit  Medication Sig Dispense Refill   Ascorbic Acid (VITAMIN C) 1000 MG tablet Take 1,000 mg by mouth daily.     aspirin  81 MG tablet Take 81 mg by mouth at bedtime.      candesartan -hydrochlorothiazide (ATACAND  HCT) 16-12.5 MG tablet Take 1 tablet by mouth daily. 90 tablet 1   cetirizine (ZYRTEC) 10 MG tablet Take 10 mg by mouth at bedtime.      citalopram  (CELEXA ) 20 MG tablet Take 1 tablet (20 mg total) by mouth daily. 90 tablet 1   empagliflozin  (JARDIANCE ) 25 MG TABS tablet Take 1 tablet (25 mg total) by mouth daily. 90 tablet 1   FERREX 150 150 MG capsule Take 1 capsule (150 mg total) by mouth daily. 90 capsule 3   fluocinonide  ointment (LIDEX ) 0.05 % Apply 1 Application topically 2 (two) times daily. (Patient taking differently: Apply 1 Application topically as needed.) 30 g 2   fluticasone  (FLONASE ) 50 MCG/ACT nasal spray Place 2 sprays into both nostrils daily. (Patient taking differently: Place 2 sprays into both nostrils as needed.) 16 g 6   hyoscyamine  (LEVBID ) 0.375 MG 12 hr tablet Take 1 tablet (0.375 mg total) by mouth 2 (two) times daily. (Patient taking differently: Take 0.375 mg by mouth as needed.) 60 tablet 5   ibuprofen (ADVIL) 200 MG tablet Take 200 mg by mouth as needed.     metFORMIN  (GLUCOPHAGE -XR) 750 MG 24  hr tablet Take 1 tablet (750 mg total) by mouth daily with breakfast. 90 tablet 1   metoprolol  succinate (TOPROL -XL) 25 MG 24 hr tablet Take 1 tablet (25 mg total) by mouth daily. Take with or immediately following a meal. Needs OV for refills 90 tablet 1   omeprazole  (PRILOSEC) 20 MG capsule Take 1 capsule (20 mg total) by mouth daily. 90 capsule 1   ondansetron  (ZOFRAN ) 4 MG tablet Take 1 tablet 20 minutes before drinking colonoscopy prep. 2 tablet 0   ONETOUCH ULTRA TEST test strip 2 (two) times daily.     rosuvastatin  (CRESTOR ) 40 MG tablet Take 1 tablet (40 mg total) by mouth at bedtime. 90 tablet 3   timolol  (TIMOPTIC ) 0.25 % ophthalmic solution Place 1 drop into the left eye 2 (two) times daily.     Travoprost, BAK Free, (TRAVATAN) 0.004 % SOLN ophthalmic solution  Place 1 drop into both eyes at bedtime.     UNABLE TO FIND at bedtime. CPAP machine     No facility-administered medications prior to visit.     Review of Systems: As above    Physical Exam:  BP 103/65   Pulse (!) 58   Ht 5' 3 (1.6 m)   Wt 194 lb 9.6 oz (88.3 kg)   SpO2 96%   BMI 34.47 kg/m   GEN: Pleasant, interactive, well-appearing; obese; in no acute distress HEENT:  Normocephalic and atraumatic. PERRLA. Sclera white. Nasal turbinates pink, moist and patent bilaterally. No rhinorrhea present. Oropharynx pink and moist, without exudate or edema. No lesions, ulcerations, or postnasal drip. Mallampati II NECK:  Supple w/ fair ROM. Thyroid  symmetrical with no goiter or nodules palpated. No lymphadenopathy.   CV: RRR, no m/r/g, no peripheral edema. Pulses intact, +2 bilaterally. No cyanosis, pallor or clubbing. PULMONARY:  Unlabored, regular breathing. Clear bilaterally A&P w/o wheezes/rales/rhonchi. No accessory muscle use.  GI: BS present and normoactive. Soft, non-tender to palpation.  MSK: No erythema, warmth or tenderness. Cap refil <2 sec all extrem.   Neuro: A/Ox3. No focal deficits noted.   Skin: Warm, no  lesions or rashe Psych: Normal affect and behavior. Judgement and thought content appropriate.     Lab Results:  CBC    Component Value Date/Time   WBC 11.4 (H) 12/06/2023 1434   RBC 4.71 12/06/2023 1434   HGB 12.4 12/06/2023 1434   HGB CANCELED 07/22/2023 1017   HCT 39.2 12/06/2023 1434   HCT CANCELED 07/22/2023 1017   PLT 421.0 (H) 12/06/2023 1434   PLT CANCELED 07/22/2023 1017   MCV 83.2 12/06/2023 1434   MCH 26.9 07/17/2023 1238   MCHC 31.7 12/06/2023 1434   RDW 16.6 (H) 12/06/2023 1434   LYMPHSABS 4.2 (H) 12/06/2023 1434   LYMPHSABS CANCELED 07/22/2023 1017   MONOABS 0.8 12/06/2023 1434   EOSABS 0.5 12/06/2023 1434   EOSABS CANCELED 07/22/2023 1017   BASOSABS 0.1 12/06/2023 1434   BASOSABS CANCELED 07/22/2023 1017    BMET    Component Value Date/Time   NA 140 12/06/2023 1434   NA 139 09/06/2023 1042   K 4.4 12/06/2023 1434   CL 100 12/06/2023 1434   CO2 30 12/06/2023 1434   GLUCOSE 108 (H) 12/06/2023 1434   BUN 23 12/06/2023 1434   BUN 28 (H) 09/06/2023 1042   CREATININE 0.80 12/06/2023 1434   CREATININE 0.68 11/18/2020 1333   CALCIUM  10.3 12/06/2023 1434   GFRNONAA >60 07/17/2023 1238   GFRAA >60 02/13/2019 0449    BNP No results found for: BNP   Imaging:  MM 3D SCREENING MAMMOGRAM BILATERAL BREAST Result Date: 11/21/2023 CLINICAL DATA:  Screening. EXAM: DIGITAL SCREENING BILATERAL MAMMOGRAM WITH TOMOSYNTHESIS AND CAD TECHNIQUE: Bilateral screening digital craniocaudal and mediolateral oblique mammograms were obtained. Bilateral screening digital breast tomosynthesis was performed. The images were evaluated with computer-aided detection. COMPARISON:  Previous exam(s). ACR Breast Density Category b: There are scattered areas of fibroglandular density. FINDINGS: There are no findings suspicious for malignancy. IMPRESSION: No mammographic evidence of malignancy. A result letter of this screening mammogram will be mailed directly to the patient.  RECOMMENDATION: Screening mammogram in one year. (Code:[id number]-B-01Y) BI-RADS CATEGORY  1: Negative. Electronically Signed   By: Toribio Agreste M.D.   On: 11/21/2023 16:12    Administration History     None           No data to display  No results found for: NITRICOXIDE      Assessment & Plan:   Severe obstructive sleep apnea Severe obstructive sleep apnea with AHI 30.7/h and SpO2 low 79%.  Reviewed risks of untreated severe sleep apnea.  Excellent compliance and control on CPAP. Receives benefit from use. Understands proper use/care of device.  Healthy weight loss encouraged.  Safe driving practices reviewed.  Patient Instructions  Continue to use CPAP every night, minimum of 4-6 hours a night.  Change equipment as directed. Wash your tubing with warm soap and water daily, hang to dry. Wash humidifier portion weekly. Use bottled, distilled water and change daily Be aware of reduced alertness and do not drive or operate heavy machinery if experiencing this or drowsiness.  Exercise encouraged, as tolerated. Healthy weight management discussed.  Avoid or decrease alcohol consumption and medications that make you more sleepy, if possible. Notify if persistent daytime sleepiness occurs even with consistent use of PAP therapy.  Change CPAP supplies... Every month Mask cushions and/or nasal pillows CPAP machine filters Every 3 months Mask frame (not including the headgear) CPAP tubing Every 6 months Mask headgear Chin strap (if applicable) Humidifier water tub  We discussed how untreated sleep apnea puts an individual at risk for cardiac arrhthymias, pulm HTN, DM, stroke and increases their risk for daytime accidents.   Follow up in one year with Katie Maeci Kalbfleisch,NP, or sooner, if needed     Advised if symptoms do not improve or worsen, to please contact office for sooner follow up or seek emergency care.   I spent 25 minutes of dedicated to the care of this patient  on the date of this encounter to include pre-visit review of records, face-to-face time with the patient discussing conditions above, post visit ordering of testing, clinical documentation with the electronic health record, making appropriate referrals as documented, and communicating necessary findings to members of the patients care team.  Comer LULLA Rouleau, NP 12/14/2023  Pt aware and understands NP's role.

## 2023-12-14 NOTE — Assessment & Plan Note (Signed)
 Severe obstructive sleep apnea with AHI 30.7/h and SpO2 low 79%.  Reviewed risks of untreated severe sleep apnea.  Excellent compliance and control on CPAP. Receives benefit from use. Understands proper use/care of device.  Healthy weight loss encouraged.  Safe driving practices reviewed.  Patient Instructions  Continue to use CPAP every night, minimum of 4-6 hours a night.  Change equipment as directed. Wash your tubing with warm soap and water daily, hang to dry. Wash humidifier portion weekly. Use bottled, distilled water and change daily Be aware of reduced alertness and do not drive or operate heavy machinery if experiencing this or drowsiness.  Exercise encouraged, as tolerated. Healthy weight management discussed.  Avoid or decrease alcohol consumption and medications that make you more sleepy, if possible. Notify if persistent daytime sleepiness occurs even with consistent use of PAP therapy.  Change CPAP supplies... Every month Mask cushions and/or nasal pillows CPAP machine filters Every 3 months Mask frame (not including the headgear) CPAP tubing Every 6 months Mask headgear Chin strap (if applicable) Humidifier water tub  We discussed how untreated sleep apnea puts an individual at risk for cardiac arrhthymias, pulm HTN, DM, stroke and increases their risk for daytime accidents.   Follow up in one year with Carol Jailyn Leeson,NP, or sooner, if needed

## 2023-12-14 NOTE — Patient Instructions (Signed)
 Continue to use CPAP every night, minimum of 4-6 hours a night.  Change equipment as directed. Wash your tubing with warm soap and water daily, hang to dry. Wash humidifier portion weekly. Use bottled, distilled water and change daily Be aware of reduced alertness and do not drive or operate heavy machinery if experiencing this or drowsiness.  Exercise encouraged, as tolerated. Healthy weight management discussed.  Avoid or decrease alcohol consumption and medications that make you more sleepy, if possible. Notify if persistent daytime sleepiness occurs even with consistent use of PAP therapy.  Change CPAP supplies... Every month Mask cushions and/or nasal pillows CPAP machine filters Every 3 months Mask frame (not including the headgear) CPAP tubing Every 6 months Mask headgear Chin strap (if applicable) Humidifier water tub  We discussed how untreated sleep apnea puts an individual at risk for cardiac arrhthymias, pulm HTN, DM, stroke and increases their risk for daytime accidents.   Follow up in one year with Katie Corbin Hott,NP, or sooner, if needed

## 2023-12-15 ENCOUNTER — Ambulatory Visit (INDEPENDENT_AMBULATORY_CARE_PROVIDER_SITE_OTHER): Admitting: Family Medicine

## 2023-12-15 ENCOUNTER — Telehealth: Payer: Self-pay

## 2023-12-15 ENCOUNTER — Encounter: Payer: Self-pay | Admitting: Family Medicine

## 2023-12-15 ENCOUNTER — Other Ambulatory Visit (HOSPITAL_COMMUNITY)
Admission: RE | Admit: 2023-12-15 | Discharge: 2023-12-15 | Disposition: A | Discharge status: Home / Self Care | Source: Ambulatory Visit | Attending: Family Medicine | Admitting: Family Medicine

## 2023-12-15 VITALS — BP 119/78 | HR 57 | Temp 97.9°F | Wt 194.0 lb

## 2023-12-15 DIAGNOSIS — R350 Frequency of micturition: Secondary | ICD-10-CM

## 2023-12-15 DIAGNOSIS — N390 Urinary tract infection, site not specified: Secondary | ICD-10-CM

## 2023-12-15 LAB — POC URINALSYSI DIPSTICK (AUTOMATED)
Bilirubin, UA: NEGATIVE
Glucose, UA: POSITIVE — AB
Ketones, UA: NEGATIVE
Leukocytes, UA: NEGATIVE
Nitrite, UA: NEGATIVE
Protein, UA: NEGATIVE
Spec Grav, UA: 1.01 (ref 1.010–1.025)
Urobilinogen, UA: NEGATIVE U/dL — AB
pH, UA: 6 (ref 5.0–8.0)

## 2023-12-15 MED ORDER — ITRACONAZOLE 100 MG PO CAPS: 200.0000 mg | ORAL_CAPSULE | Freq: Two times a day (BID) | ORAL | 0 refills | Status: AC

## 2023-12-15 MED ORDER — NITROFURANTOIN MONOHYD MACRO 100 MG PO CAPS: 100.0000 mg | ORAL_CAPSULE | Freq: Two times a day (BID) | ORAL | 0 refills | Status: AC

## 2023-12-15 NOTE — Patient Instructions (Addendum)
 Return if symptoms worsen or fail to improve.  Increase hydration!!!      Great to see you today.  I have refilled the medication(s) we provide.   If labs were collected or images ordered, we will inform you of  results once we have received them and reviewed. We will contact you either by echart message, or telephone call.  Please give ample time to the testing facility, and our office to run,  receive and review results. Please do not call inquiring of results, even if you can see them in your chart. We will contact you as soon as we are able. If it has been over 1 week since the test was completed, and you have not yet heard from us , then please call us .    - echart message- for normal results that have been seen by the patient already.   - telephone call: abnormal results or if patient has not viewed results in their echart.  If a referral to a specialist was entered for you, please call us  in 2 weeks if you have not heard from the specialist office to schedule.

## 2023-12-15 NOTE — Telephone Encounter (Signed)
 Copied from CRM [id number]. Topic: Clinical - Prescription Issue >> Dec 15, 2023  9:41 AM Chasity T wrote: Reason for CRM: J[facility] from [facility] is calling regarding patient insurance flagging 2 medications itraconazole  (SPORANOX ) 100 MG capsule and omeprazole  (PRILOSEC) 20 MG capsule. She wants to know if its ok to still give them to her. Call back number is [phone]

## 2023-12-15 NOTE — Telephone Encounter (Signed)
 Yes, please tell pharmacy to give the patient medication prescribed

## 2023-12-15 NOTE — Progress Notes (Signed)
 Carol [name] , 1956/02/28, 68 y.o., female MRN: [id number] Patient Care Team    Relationship Specialty Notifications Start End  Catherine Charlies LABOR, DO PCP - General Family Medicine  06/10/19   Jeffrie Oneil BROCKS, MD Consulting Physician Cardiology  06/12/19   Avram Lupita BRAVO, MD Consulting Physician Gastroenterology  06/12/19   Maree Lonni Inks, MD Consulting Physician Ophthalmology  06/12/19   Sharman Doffing, MD Referring Physician Obstetrics and Gynecology  06/12/19   Maurilio Camellia PARAS, MD Consulting Physician Allergy and Immunology  06/12/19   [facility]  07/26/21   Zan Factor, DPM Consulting Physician Podiatry  05/10/23     Chief Complaint  Patient presents with   Urinary Frequency    A few days; dysuria, abdominal pressure, urinary frequency. Pt has tried Advil/Tylenol .      Subjective: Carol [name] is a 68 y.o. Pt presents for an OV with complaints of dysuria of 4 days duration.  Associated symptoms include abd pressure (with urination only) and urinary frequency.  She recently had similar sx with cx positive for candida krusei. Pt is prescribed jardiance  for DM.She reports mild vaginal discharge present.  Pt denies fever, chills, nausea, diarrhea or vomit.   Pt has tried advil to ease their symptoms.      07/26/2023    9:40 AM 02/13/2023    1:19 PM 09/02/2022    8:53 AM 08/02/2022    2:23 PM 07/13/2022    9:33 AM  Depression screen PHQ 2/9  Decreased Interest 0 1 0 0 0  Down, Depressed, Hopeless 0 0 0 0 0  PHQ - 2 Score 0 1 0 0 0  Altered sleeping 0 0 0    Tired, decreased energy 0 3 0    Change in appetite 0 0 0    Feeling bad or failure about yourself  0 0 0    Trouble concentrating 0 0 0    Moving slowly or fidgety/restless 0 0 0    Suicidal thoughts 0 0 0    PHQ-9 Score 0 4 0    Difficult doing work/chores Not difficult at all Not difficult at all Not difficult at all      Allergies  Allergen Reactions   Ivp Dye [Iodinated  Contrast Media] Hives and Shortness Of Breath    Did ok in OP setting with 13 hr Premedications    Latanoprost  Other (See Comments)   Lisinopril Cough   Onion Rash    Rash and GI upset   Sulfa Antibiotics Hives   Social History   Social History Narrative   Marital status/children/pets: Married.   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   Safety:      -smoke alarm in the home:Yes     - wears seatbelt: Yes     - Feels safe in their relationships: Yes   Past Medical History:  Diagnosis Date   Angio-edema    Blood type O+    Chest discomfort    , Atypical   Chicken pox    COVID-19 05/09/2019   Diabetic acidosis, type II (HCC)    Diverticulitis - large vs small bowel vs both?    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  Normal performed by Dr. Annis   COLONOSCOPY  04/03/2019   COLONOSCOPY  11/17/2023   LAPAROTOMY N/A 11/05/2013   Procedure: EXPLORATORY LAPAROTOMY;  Surgeon: Lynwood MALVA Pina, MD;  Location: [facility] OR;  Service: General;  Laterality: N/A;   Lexiscan  2010   Patient reports normal Lexiscan completed around 2010 by Dr. Annis.   TOTAL ABDOMINAL HYSTERECTOMY  2004   Family History  Problem Relation Age of Onset   Heart disease Mother    Heart attack Mother    Asthma Mother    Early death Mother    Miscarriages / Stillbirths Mother    Emphysema Mother    Heart disease  Father    Lung cancer Father    Diverticulitis Sister    Heart disease Sister    Heart attack Brother    Asthma Brother    Heart disease Brother    Kidney disease Brother    Heart disease Brother    Heart attack Brother    Heart disease Brother    Heart attack Brother    Meniere's disease Brother    Early death Maternal Grandmother    Tuberculosis Maternal Grandmother    Depression Maternal Grandfather    Heart disease Paternal Grandmother    Colon cancer Neg Hx    Esophageal cancer Neg Hx    Rectal cancer Neg Hx    Stomach cancer Neg Hx    Allergies as of 12/15/2023       Reactions   Ivp Dye [iodinated Contrast Media] Hives, Shortness Of Breath   Did ok in OP setting with 13 hr Premedications    Latanoprost  Other (See Comments)   Lisinopril Cough   Onion Rash   Rash and GI upset   Sulfa Antibiotics Hives        Medication List        Accurate as of December 15, 2023  9:25 AM. If you have any questions, ask your nurse or doctor.          STOP taking these medications    Advil 200 MG tablet Generic drug: ibuprofen Stopped by: Charlies Bellini       TAKE these medications    aspirin  81 MG tablet Take 81 mg by mouth at bedtime.   candesartan -hydrochlorothiazide 16-12.5 MG tablet Commonly known as: ATACAND  HCT Take 1 tablet by mouth daily.   cetirizine 10 MG tablet Commonly known as: ZYRTEC Take 10 mg by mouth at bedtime.   citalopram  20 MG tablet Commonly known as: CELEXA  Take 1 tablet (20 mg total) by mouth daily.   empagliflozin  25 MG Tabs tablet Commonly known as: Jardiance  Take 1 tablet (25 mg total) by mouth daily.   Ferrex 150 150 MG capsule Generic drug: iron  polysaccharides Take 1 capsule (150 mg total) by mouth daily.   fluocinonide  ointment 0.05 % Commonly known as: LIDEX  Apply 1 Application topically 2 (two) times daily. What changed:  when to take this reasons to take this   fluticasone  50 MCG/ACT nasal spray Commonly known  as: FLONASE  Place 2 sprays into both nostrils daily. What changed:  when to take this reasons to take this   hyoscyamine  0.375 MG 12 hr tablet Commonly known as: LEVBID  Take 1 tablet (0.375 mg total) by mouth 2 (two) times daily. What changed:  when to take this reasons to take this   itraconazole  100 MG capsule Commonly known as: Sporanox  Take 2 capsules (200 mg total) by mouth 2 (two) times daily for 14 days. Started by: Charlies Bellini   metFORMIN  750 MG 24 hr tablet Commonly known as: GLUCOPHAGE -XR Take 1 tablet (750 mg total) by mouth daily with breakfast.   metoprolol  succinate 25 MG 24 hr tablet Commonly known as: TOPROL -XL Take 1 tablet (25 mg total) by mouth daily. Take with or immediately following a meal. Needs OV for refills   nitrofurantoin  (macrocrystal-monohydrate) 100 MG capsule Commonly known as: Macrobid  Take 1 capsule (100 mg total) by mouth 2 (two) times daily for 5 days. Started by: Charlies Bellini   omeprazole  20 MG capsule Commonly known as: PRILOSEC Take 1 capsule (20 mg total) by mouth daily.   ondansetron  4 MG tablet Commonly known as: ZOFRAN  Take 1 tablet 20 minutes before drinking colonoscopy prep.   OneTouch Ultra Test test strip Generic drug: glucose blood 2 (two) times daily.   rosuvastatin  40 MG tablet Commonly known as: CRESTOR  Take 1 tablet (40 mg total) by mouth at bedtime.   timolol  0.25 % ophthalmic solution Commonly known as: TIMOPTIC  Place 1 drop into the left eye 2 (two) times daily.   Travoprost (BAK Free) 0.004 % Soln ophthalmic solution Commonly known as: TRAVATAN Place 1 drop into both eyes at bedtime.   UNABLE TO FIND at bedtime. CPAP machine   vitamin C 1000 MG tablet Take 1,000 mg by mouth daily.        All past medical history, surgical history, allergies, family history, immunizations andmedications were updated in the EMR today and reviewed under the history and medication portions of their EMR.      ROS Negative, with the exception of above mentioned in HPI   Objective:  BP 119/78   Pulse (!) 57   Temp 97.9 F (36.6 C)   Wt 194 lb (88 kg)   SpO2 97%   BMI 34.37 kg/m  Body mass index is 34.37 kg/m. Physical Exam Vitals and nursing note reviewed.  Constitutional:      General: She is not in acute distress.    Appearance: Normal appearance. She is normal weight. She is not ill-appearing or toxic-appearing.  HENT:     Head: Normocephalic and atraumatic.  Eyes:     General: No scleral icterus.       Right eye: No discharge.        Left eye: No discharge.     Extraocular Movements: Extraocular movements intact.     Conjunctiva/sclera: Conjunctivae normal.     Pupils: Pupils are equal, round, and reactive to light.  Abdominal:     General: Abdomen is flat. Bowel sounds are normal. There is no distension.     Palpations: Abdomen is soft. There is no mass.     Tenderness: There is no abdominal tenderness. There is no right CVA tenderness, left CVA tenderness, guarding or rebound.  Skin:    Findings: No rash.  Neurological:     Mental Status: She is alert and oriented to person, place, and time. Mental status is at baseline.     Motor: No weakness.     Coordination: Coordination normal.     Gait: Gait normal.  Psychiatric:        Mood and Affect: Mood normal.        Behavior: Behavior normal.        Thought Content: Thought content normal.        Judgment: Judgment normal.      No results found. No results found. Results for orders placed or performed in visit on 12/15/23 (from the past 24 hours)  POCT Urinalysis Dipstick (Automated)     Status: Abnormal   Collection Time: 12/15/23  9:11 AM  Result Value Ref Range   Color, UA yellow    Clarity, UA clear    Glucose, UA Positive (A) Negative   Bilirubin, UA negative    Ketones, UA negative    Spec Grav, UA 1.010 1.010 - 1.025   Blood, UA none    pH, UA 6.0 5.0 - 8.0   Protein, UA Negative Negative    Urobilinogen, UA negative (A) 0.2 or 1.0 E.U./dL   Nitrite, UA negative    Leukocytes, UA Negative Negative    Assessment/Plan: LATRICE STORLIE is a 68 y.o. female present for OV for  Urinary frequency (Primary)/Recurrent UTI/ candida infection hydrate Possibly another candida krusei infection with jardiance  use.  - POCT Urinalysis Dipstick (Automated)> glucose only on jardiance  - Urinalysis w microscopic + reflex cultur pending - Cervicovaginal ancillary only( [place])>pending Will tx w/ macrobid  bid and sporanox   while we wait on cxs.  May need to consider different DM med or weekly yeast proph tx. F/u dependent upon results.    Reviewed expectations re: course of current medical issues. Discussed self-management of symptoms. Outlined signs and symptoms indicating need for more acute intervention. Patient verbalized understanding and all questions were answered. Patient received an After-Visit Summary.    Orders Placed This Encounter  Procedures   Urinalysis w microscopic + reflex cultur   POCT Urinalysis Dipstick (Automated)   Meds ordered this encounter  Medications   itraconazole  (SPORANOX ) 100 MG capsule    Sig: Take 2 capsules (200 mg total) by mouth 2 (two) times daily for 14 days.    Dispense:  56 capsule    Refill:  0   nitrofurantoin , macrocrystal-monohydrate, (MACROBID ) 100 MG capsule    Sig: Take 1 capsule (100 mg total) by mouth 2 (two) times daily for 5 days.    Dispense:  10 capsule    Refill:  0   Referral Orders  No referral(s) requested today     Note is dictated utilizing voice recognition software. Although note has been proof read prior to signing, occasional typographical errors still can be missed. If any questions arise, please do not hesitate to call for verification.   electronically signed by:  Charlies Bellini, DO  [facility] - [place]

## 2023-12-17 LAB — URINALYSIS W MICROSCOPIC + REFLEX CULTURE
Bacteria, UA: NONE SEEN /HPF
Bilirubin Urine: NEGATIVE
Hgb urine dipstick: NEGATIVE
Hyaline Cast: NONE SEEN /LPF
Ketones, ur: NEGATIVE
Nitrites, Initial: NEGATIVE
Protein, ur: NEGATIVE
RBC / HPF: NONE SEEN /HPF (ref 0–2)
Specific Gravity, Urine: 1.012 (ref 1.001–1.035)
Squamous Epithelial / HPF: NONE SEEN /HPF (ref <=5)
WBC, UA: NONE SEEN /HPF (ref 0–5)
pH: 6.5 (ref 5.0–8.0)

## 2023-12-17 LAB — URINE CULTURE
MICRO NUMBER:: 16931385
SPECIMEN QUALITY:: ADEQUATE

## 2023-12-17 LAB — CULTURE INDICATED

## 2023-12-18 ENCOUNTER — Other Ambulatory Visit (HOSPITAL_COMMUNITY): Payer: Self-pay

## 2023-12-18 ENCOUNTER — Ambulatory Visit: Payer: Self-pay | Admitting: Family Medicine

## 2023-12-18 LAB — CERVICOVAGINAL ANCILLARY ONLY
Bacterial Vaginitis (gardnerella): NEGATIVE
Candida Glabrata: NEGATIVE
Candida Vaginitis: NEGATIVE
Chlamydia: NEGATIVE
Comment: NEGATIVE
Comment: NEGATIVE
Comment: NEGATIVE
Comment: NEGATIVE
Comment: NEGATIVE
Comment: NORMAL
Neisseria Gonorrhea: NEGATIVE
Trichomonas: NEGATIVE

## 2023-12-19 MED ORDER — FLUCONAZOLE 150 MG PO TABS
ORAL_TABLET | ORAL | 0 refills | Status: DC
Start: 2023-12-19 — End: 2024-01-19

## 2023-12-21 DIAGNOSIS — X32XXXS Exposure to sunlight, sequela: Secondary | ICD-10-CM | POA: Diagnosis not present

## 2023-12-21 DIAGNOSIS — D235 Other benign neoplasm of skin of trunk: Secondary | ICD-10-CM | POA: Diagnosis not present

## 2023-12-21 DIAGNOSIS — L738 Other specified follicular disorders: Secondary | ICD-10-CM | POA: Diagnosis not present

## 2023-12-21 DIAGNOSIS — L570 Actinic keratosis: Secondary | ICD-10-CM | POA: Diagnosis not present

## 2023-12-21 DIAGNOSIS — L573 Poikiloderma of Civatte: Secondary | ICD-10-CM | POA: Diagnosis not present

## 2023-12-21 DIAGNOSIS — L814 Other melanin hyperpigmentation: Secondary | ICD-10-CM | POA: Diagnosis not present

## 2023-12-21 DIAGNOSIS — D2362 Other benign neoplasm of skin of left upper limb, including shoulder: Secondary | ICD-10-CM | POA: Diagnosis not present

## 2023-12-21 DIAGNOSIS — D03122 Melanoma in situ of left lower eyelid, including canthus: Secondary | ICD-10-CM | POA: Diagnosis not present

## 2023-12-21 DIAGNOSIS — L308 Other specified dermatitis: Secondary | ICD-10-CM | POA: Diagnosis not present

## 2023-12-21 DIAGNOSIS — D485 Neoplasm of uncertain behavior of skin: Secondary | ICD-10-CM | POA: Diagnosis not present

## 2023-12-21 DIAGNOSIS — L918 Other hypertrophic disorders of the skin: Secondary | ICD-10-CM | POA: Diagnosis not present

## 2024-01-03 ENCOUNTER — Other Ambulatory Visit: Payer: Self-pay

## 2024-01-03 ENCOUNTER — Encounter (HOSPITAL_BASED_OUTPATIENT_CLINIC_OR_DEPARTMENT_OTHER): Payer: Self-pay | Admitting: Physical Therapy

## 2024-01-03 ENCOUNTER — Ambulatory Visit (HOSPITAL_BASED_OUTPATIENT_CLINIC_OR_DEPARTMENT_OTHER): Attending: Rheumatology | Admitting: Physical Therapy

## 2024-01-03 DIAGNOSIS — M25562 Pain in left knee: Secondary | ICD-10-CM | POA: Insufficient documentation

## 2024-01-03 DIAGNOSIS — M533 Sacrococcygeal disorders, not elsewhere classified: Secondary | ICD-10-CM | POA: Diagnosis not present

## 2024-01-03 DIAGNOSIS — M25561 Pain in right knee: Secondary | ICD-10-CM | POA: Insufficient documentation

## 2024-01-03 DIAGNOSIS — M2242 Chondromalacia patellae, left knee: Secondary | ICD-10-CM | POA: Diagnosis not present

## 2024-01-03 DIAGNOSIS — M79642 Pain in left hand: Secondary | ICD-10-CM | POA: Insufficient documentation

## 2024-01-03 DIAGNOSIS — M19042 Primary osteoarthritis, left hand: Secondary | ICD-10-CM | POA: Diagnosis not present

## 2024-01-03 DIAGNOSIS — M19041 Primary osteoarthritis, right hand: Secondary | ICD-10-CM | POA: Diagnosis not present

## 2024-01-03 DIAGNOSIS — M25642 Stiffness of left hand, not elsewhere classified: Secondary | ICD-10-CM | POA: Insufficient documentation

## 2024-01-03 DIAGNOSIS — M25542 Pain in joints of left hand: Secondary | ICD-10-CM

## 2024-01-03 DIAGNOSIS — G8929 Other chronic pain: Secondary | ICD-10-CM | POA: Diagnosis not present

## 2024-01-03 DIAGNOSIS — M5459 Other low back pain: Secondary | ICD-10-CM | POA: Insufficient documentation

## 2024-01-03 DIAGNOSIS — M79641 Pain in right hand: Secondary | ICD-10-CM

## 2024-01-03 NOTE — Therapy (Addendum)
 OUTPATIENT PHYSICAL THERAPY UPPER EXTREMITY EVALUATION   Patient Name: Carol [name] MRN: [id number] DOB:May 15, 1955, 68 y.o., female Today's Date: 01/04/2024  END OF SESSION:  PT End of Session - 01/03/24 1432     Visit Number 1    Number of Visits 16    Date for Recertification  02/29/24    PT Start Time 1433    PT Stop Time 1520    PT Time Calculation (min) 47 min    Activity Tolerance Patient tolerated treatment well    Behavior During Therapy [facility] for tasks assessed/performed          Past Medical History:  Diagnosis Date   Angio-edema    Blood type O+    Chest discomfort    , Atypical   Chicken pox    COVID-19 05/09/2019   Diabetic acidosis, type II (HCC)    Diverticulitis - large vs small bowel vs both?    Gastroesophageal reflux disease    Glaucoma    Hyperlipidemia    Hypertension    LLQ abdominal pain 09/26/2023   Plantar fasciitis, bilateral 2015   PVC (premature ventricular contraction)    Sleep apnea 07/2023   Systolic dysfunction    , Hyperdynamic   Past Surgical History:  Procedure Laterality Date   APPENDECTOMY N/A 11/05/2013   Procedure: APPENDECTOMY;  Surgeon: Lynwood MALVA Pina, MD;  Location: [facility] OR;  Service: General;  Laterality: N/A;   BOWEL RESECTION N/A 11/05/2013   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  Normal performed by Dr. Annis   COLONOSCOPY  04/03/2019   COLONOSCOPY  11/17/2023   LAPAROTOMY N/A 11/05/2013   Procedure: EXPLORATORY LAPAROTOMY;  Surgeon: Lynwood MALVA Pina, MD;  Location: [facility] OR;  Service: General;  Laterality: N/A;   Lexiscan  2010   Patient reports normal Lexiscan completed around 2010 by Dr. Annis.   TOTAL ABDOMINAL HYSTERECTOMY  2004   Patient Active Problem List   Diagnosis Date Noted   Altered bowel habits 09/26/2023   Severe obstructive sleep apnea 08/18/2023   Elevated coronary artery calcium  score 133; 72nd percentile 06/26/2023    ANA positive 05/15/2023   Abnormal SPEP 05/15/2023   B12 deficiency 01/13/2023   Irritable bowel syndrome with diarrhea 09/02/2022   Full incontinence of feces 09/02/2022   Recurrent UTI 01/10/2022   Obesity (BMI 30-39.9) 03/25/2021   Hypercalcemia 12/08/2020   Iron  deficiency anemia 12/08/2020   Hot flashes due to menopause 11/06/2019   Morbid obesity (HCC) 02/11/2019   Type 2 diabetes mellitus with hyperlipidemia (HCC) 02/11/2019   Diverticulitis large intestine 10/31/2013   Systolic dysfunction    Gastroesophageal reflux disease    Hypertension     PCP: Catherine Charlies LABOR, DO  REFERRING PROVIDER: Dolphus Reiter, MD  REFERRING DIAG:  [phone] (ICD-10-CM) - Pain in both hands  M25.562,G89.29 (ICD-10-CM) - Chronic pain of left knee  M53.3,G89.29 (ICD-10-CM) - Chronic SI joint pain    THERAPY DIAG:  Pain in left hand  Stiffness of left hand, not elsewhere classified  Other low back pain  Chronic pain of left knee  Chronic pain of right knee  Rationale for Evaluation and Treatment: Rehabilitation  ONSET DATE: Chronic   SUBJECTIVE:  SUBJECTIVE STATEMENT: Patient reports that her most pain is in her left thumb area. Reports that pain in thumb the the worst when she is trying to hold something. Sleeps in brace that holds thumb to help pain, works if it is tight enough. Holding a book, phone, or Ipad causes pain everywhere. Back pain is now and again, last time it hurt was when she was at the doctor. Back pain can last up to two weeks, she has to rest a lot. Back pain is typically central in low back. L knee pain is always there, R knee doesn't hurt as bad. Knee pain is chronic. Standing and walking causes knee pain to be worse.   Hand dominance: Right  PERTINENT HISTORY: Diabetes,  plantar fasciitis   PAIN:  Are you having pain? Yes: NPRS scale: o at rest, 10 with movement  Pain location: L thumb Pain description:   Aggravating factors: Holding things  Relieving factors:    PRECAUTIONS: None  RED FLAGS: None   WEIGHT BEARING RESTRICTIONS: No  FALLS:  Has patient fallen in last 6 months? No  OCCUPATION: Works in office  PLOF: Independent  PATIENT GOALS: Decrease pain    OBJECTIVE:  Note: Objective measures were completed at Evaluation unless otherwise noted.  DIAGNOSTIC FINDINGS:  Severe CMC narrowing and subluxation was noted.  PIP and DIP narrowing was  noted.  No MCP, intercarpal radiocarpal joint space narrowing was noted.   No erosive changes were noted.   Impression: These findings suggestive of osteoarthritis of the hand.   PATIENT SURVEYS :  Quick Dash:  QUICK DASH  Please rate your ability do the following activities in the last week by selecting the number below the appropriate response.   Activities Rating  Open a tight or new jar.  4 = Severe difficulty  Do heavy household chores (e.g., wash walls, floors). 4 = Severe difficulty  Carry a shopping bag or briefcase 2 = Mild difficulty  Wash your back. 2 = Mild difficulty  Use a knife to cut food. 2 = Mild difficulty  Recreational activities in which you take some force or impact through your arm, shoulder or hand (e.g., golf, hammering, tennis, etc.). 5 = Unable  During the past week, to what extent has your arm, shoulder or hand problem interfered with your normal social activities with family, friends, neighbors or groups?  2 = Slightly  During the past week, were you limited in your work or other regular daily activities as a result of your arm, shoulder or hand problem? 4 = Very limited  Rate the severity of the following symptoms in the last week: Arm, Shoulder, or hand pain. 5 = Extreme  Rate the severity of the following symptoms in the last week: Tingling (pins and needles)  in your arm, shoulder or hand. 4 = Severe  During the past week, how much difficulty have you had sleeping because of the pain in your arm, shoulder or hand?  2 = Mild difficulty   (A QuickDASH score may not be calculated if there is greater than 1 missing item.)  Total: 35/55  Minimally Clinically Important Difference (MCID): 15-20 points  (Franchignoni, F. et al. (2013). Minimally clinically important difference of the disabilities of the arm, shoulder, and hand outcome measures (DASH) and its shortened version (Quick DASH). Journal of Orthopaedic & Sports Physical Therapy, 44(1), 30-39)   COGNITION: Overall cognitive status: Within functional limits for tasks assessed     SENSATION: WFL  POSTURE: Forward head,  rounded shoulders   UPPER EXTREMITY ROM:   Active ROM Right eval Left eval  Shoulder flexion    Shoulder extension    Shoulder abduction    Shoulder adduction    Shoulder internal rotation    Shoulder external rotation    Elbow flexion    Elbow extension    Wrist flexion    Wrist extension    Wrist ulnar deviation    Wrist radial deviation    Wrist pronation    Wrist supination    Thumb Opposition   Some pain   (Blank rows = not tested)  LUMBAR ROM:   Active  A/PROM  eval  Flexion WFL  Extension WFL  Right lateral flexion WFL  Left lateral flexion WFL  Right rotation WFL, pain at end range  Left rotation WFL, pain at end range    (Blank rows = not tested)  LOWER EXTREMITY ROM:     Passive  Right eval Left eval  Hip flexion  Pain and limited ROM compared to right  at end range   Hip extension    Hip abduction    Hip adduction    Hip internal rotation    Hip external rotation    Knee flexion    Knee extension    Ankle dorsiflexion    Ankle plantarflexion    Ankle inversion    Ankle eversion     (Blank rows = not tested)  UPPER EXTREMITY MMT:  MMT Right eval Left eval  Shoulder flexion    Shoulder extension    Shoulder abduction     Shoulder adduction    Shoulder internal rotation    Shoulder external rotation    Middle trapezius    Lower trapezius    Elbow flexion    Elbow extension    Wrist flexion    Wrist extension    Wrist ulnar deviation    Wrist radial deviation    Wrist pronation    Wrist supination    Grip strength (lbs) 20  5  Pinch grip  14.0 10.0  (Blank rows = not tested)  LOWER EXTREMITY MMT:    MMT Right eval Left eval  Hip flexion 9.4 12.4  Hip extension    Hip abduction 15.3 15.2  Hip adduction    Hip internal rotation    Hip external rotation    Knee flexion 35.5 29.2  Knee extension    Ankle dorsiflexion    Ankle plantarflexion    Ankle inversion    Ankle eversion     (Blank rows = not tested)  PALPATION:  Tenderness to L CMC joint                                                                                                                              TREATMENT DATE:  01/03/24   PATIENT EDUCATION: Education details: Educated on HEP Person educated: Patient Education method: Explanation Education comprehension: verbalized understanding  HOME  EXERCISE PROGRAM: Thumb opposition Thumb abduction strengthening with rubber band  Key grip with puddy  CMC distraction   ASSESSMENT:  CLINICAL IMPRESSION: Patient is a 68 y.o. female who was seen today for physical therapy evaluation and treatment for bilateral hand pain, low back pain, and bilateral knee pain. Patient experiences the most difficulty with her left CMC joint. She has decreased pinch and grip strength. Pain is limiting her ability to complete ADLs. She tolerated HEP well and explained understanding of all exercises. She will benefit from skilled therapy to address strength, range of motion, activity tolerance, and pain deficits.      OBJECTIVE IMPAIRMENTS: Abnormal gait, decreased endurance, decreased mobility, difficulty walking, decreased ROM, decreased strength, and hypomobility.   ACTIVITY LIMITATIONS:  carrying, lifting, bending, sitting, standing, squatting, and stairs  PARTICIPATION LIMITATIONS: cleaning, laundry, driving, shopping, community activity, occupation, and yard work  PERSONAL FACTORS: 1 comorbidity: Diabetes are also affecting patient's functional outcome.   REHAB POTENTIAL: Fair Chronic, multiple pain sites  CLINICAL DECISION MAKING: Evolving/moderate complexity decreasing ability to use her thumb  EVALUATION COMPLEXITY: Moderate  GOALS: Goals reviewed with patient? Yes  SHORT TERM GOALS: Target date: 01/17/24  Patient will be independent with HEP in order to improve functional outcomes. Baseline: Goal status: INITIAL  2.  Patient will improve pinch strength by 5 lbs Baseline:  Goal status: INITIAL  3.  Patient will increase grip strength by 5 lbs Baseline: Goal status: INITIAL           4. Patient will increase gross bilateral LE strength by 5 lbs   LONG TERM GOALS: Target date: 02/28/24  Patient will report at least 75% improvement in symptoms for improved quality of life. Baseline:  Goal status: INITIAL  2.  Patient will improve Quick-Dash score by at least 15 points in order to indicate improved tolerance to activity. Baseline:  Goal status: INITIAL  3.  Patient will improve left hand grip strength to 20 lbs in order to indicate improved strength and activity tolerance.  Baseline:  Goal status: INITIAL  4.  Patient will improve key grip by 5 lbs to indicate improved intrinsic hand strength.  Baseline:  Goal status: INITIAL  5.  Patient will tolerate 45 minutes of walking before the onset of pain to indicate improved activity tolerance.  Baseline: ~15 minutes  Goal status: INITIAL  PLAN: PT FREQUENCY: 1-2x/week  PT DURATION: 8 weeks  PLANNED INTERVENTIONS: 97110-Therapeutic exercises, 97530- Therapeutic activity, [id number]- Neuromuscular re-education, 97535- Self Care, [id number]- Manual therapy, and Patient/Family education  PLAN FOR NEXT SESSION:   Begin with manual therapy to [facility] joint. Consider distraction and trigger point release. Progress strength as tolerated with the thumb. Next visit give base bilateral LE strengthening program with emphasis on posture for the back. Consider reviewed soft tissue mobilization for the back for when it does hurt.    Alm JINNY Don, PT 01/04/2024, 11:26 AM   PCP: Catherine Charlies LABOR, DO  REFERRING PROVIDER: Dolphus Reiter, MD  REFERRING DIAG:  [phone] (ICD-10-CM) - Pain in both hands  [phone] (ICD-10-CM) - Chronic pain of left knee  M53.3,G89.29 (ICD-10-CM) - Chronic SI joint pain    THERAPY DIAG:  Pain in left hand  Stiffness of left hand, not elsewhere classified  Other low back pain  Chronic pain of left knee  Chronic pain of right knee  Rationale for Evaluation and Treatment: Rehabilitation  ONSET DATE: Chronic   What was this (referring dx) caused by? Ongoing Issue and Arthritis  Lysle  of Condition: Chronic (continuous duration > 3 months)   Laterality: Lt  Current Functional Measure Score: Other Quick Dash 33/55  Objective measurements identify impairments when they are compared to normal values, the uninvolved extremity, and prior level of function.  [x]  Yes  []  No  Objective assessment of functional ability: Moderate functional limitations   Briefly describe symptoms: see above  How did symptoms start: chronic   Average pain intensity:  Last 24 hours: 5/10  Past week: 5/10  How often does the pt experience symptoms? Frequently  How much have the symptoms interfered with usual daily activities? Quite a bit  How has condition changed since care began at this facility? NA - initial visit  In general, how is the patients overall health? Good   BACK PAIN (STarT Back Screening Tool) No

## 2024-01-04 ENCOUNTER — Encounter (HOSPITAL_BASED_OUTPATIENT_CLINIC_OR_DEPARTMENT_OTHER): Payer: Self-pay | Admitting: Physical Therapy

## 2024-01-08 DIAGNOSIS — D0339 Melanoma in situ of other parts of face: Secondary | ICD-10-CM | POA: Diagnosis not present

## 2024-01-08 DIAGNOSIS — H401190 Primary open-angle glaucoma, unspecified eye, stage unspecified: Secondary | ICD-10-CM | POA: Diagnosis not present

## 2024-01-08 DIAGNOSIS — M1990 Unspecified osteoarthritis, unspecified site: Secondary | ICD-10-CM | POA: Diagnosis not present

## 2024-01-10 ENCOUNTER — Encounter (HOSPITAL_BASED_OUTPATIENT_CLINIC_OR_DEPARTMENT_OTHER): Payer: Self-pay | Admitting: Physical Therapy

## 2024-01-10 ENCOUNTER — Ambulatory Visit (HOSPITAL_BASED_OUTPATIENT_CLINIC_OR_DEPARTMENT_OTHER): Attending: Rheumatology | Admitting: Physical Therapy

## 2024-01-10 DIAGNOSIS — M25562 Pain in left knee: Secondary | ICD-10-CM | POA: Insufficient documentation

## 2024-01-10 DIAGNOSIS — M25642 Stiffness of left hand, not elsewhere classified: Secondary | ICD-10-CM | POA: Diagnosis present

## 2024-01-10 DIAGNOSIS — M5459 Other low back pain: Secondary | ICD-10-CM | POA: Diagnosis present

## 2024-01-10 DIAGNOSIS — M79642 Pain in left hand: Secondary | ICD-10-CM | POA: Insufficient documentation

## 2024-01-10 DIAGNOSIS — M25561 Pain in right knee: Secondary | ICD-10-CM | POA: Diagnosis present

## 2024-01-10 DIAGNOSIS — G8929 Other chronic pain: Secondary | ICD-10-CM | POA: Diagnosis present

## 2024-01-10 NOTE — Therapy (Signed)
 OUTPATIENT PHYSICAL THERAPY UPPER EXTREMITY TREATMENT   Patient Name: Carol [name] MRN: [id number] DOB:[birth year], 68 y.o., female Today's Date: 01/10/2024  END OF SESSION:  PT End of Session - 01/10/24 1104     Visit Number 2    Number of Visits 16    Date for Recertification  02/29/24    PT Start Time 1104    PT Stop Time 1142    PT Time Calculation (min) 38 min    Activity Tolerance Patient tolerated treatment well    Behavior During Therapy [facility] for tasks assessed/performed          Past Medical History:  Diagnosis Date   Angio-edema    Blood type O+    Chest discomfort    , Atypical   Chicken pox    COVID-19 05/09/2019   Diabetic acidosis, type II (HCC)    Diverticulitis - large vs small bowel vs both?    Gastroesophageal reflux disease    Glaucoma    Hyperlipidemia    Hypertension    LLQ abdominal pain 09/26/2023   Plantar fasciitis, bilateral 2015   PVC (premature ventricular contraction)    Sleep apnea 07/2023   Systolic dysfunction    , Hyperdynamic   Past Surgical History:  Procedure Laterality Date   APPENDECTOMY N/A 11/05/2013   Procedure: APPENDECTOMY;  Surgeon: Lynwood MALVA Pina, MD;  Location: [facility] OR;  Service: General;  Laterality: N/A;   BOWEL RESECTION N/A 11/05/2013   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  Normal performed by Dr. Annis   COLONOSCOPY  04/03/2019   COLONOSCOPY  11/17/2023   LAPAROTOMY N/A 11/05/2013   Procedure: EXPLORATORY LAPAROTOMY;  Surgeon: Lynwood MALVA Pina, MD;  Location: [facility] OR;  Service: General;  Laterality: N/A;   Lexiscan  2010   Patient reports normal Lexiscan completed around 2010 by Dr. Annis.   TOTAL ABDOMINAL HYSTERECTOMY  2004   Patient Active Problem List   Diagnosis Date Noted   Altered bowel habits 09/26/2023   Severe obstructive sleep apnea 08/18/2023   Elevated coronary artery calcium  score 133; 72nd percentile 06/26/2023    ANA positive 05/15/2023   Abnormal SPEP 05/15/2023   B12 deficiency 01/13/2023   Irritable bowel syndrome with diarrhea 09/02/2022   Full incontinence of feces 09/02/2022   Recurrent UTI 01/10/2022   Obesity (BMI 30-39.9) 03/25/2021   Hypercalcemia 12/08/2020   Iron  deficiency anemia 12/08/2020   Hot flashes due to menopause 11/06/2019   Morbid obesity (HCC) 02/11/2019   Type 2 diabetes mellitus with hyperlipidemia (HCC) 02/11/2019   Diverticulitis large intestine 10/31/2013   Systolic dysfunction    Gastroesophageal reflux disease    Hypertension     PCP: Catherine Charlies LABOR, DO  REFERRING PROVIDER: Dolphus Reiter, MD  REFERRING DIAG:  [phone] (ICD-10-CM) - Pain in both hands  M25.562,G89.29 (ICD-10-CM) - Chronic pain of left knee  M53.3,G89.29 (ICD-10-CM) - Chronic SI joint pain    THERAPY DIAG:  Pain in left hand  Stiffness of left hand, not elsewhere classified  Other low back pain  Chronic pain of left knee  Chronic pain of right knee  Rationale for Evaluation and Treatment: Rehabilitation  ONSET DATE: Chronic   SUBJECTIVE:  SUBJECTIVE STATEMENT: Pt reports she has been completing Lt hand strengthening exercises multiple times a day for 1 week. No change in pain.  Has tried self-massage to hand. Wearing brace at night.   From initial evaluation:  Patient reports that her most pain is in her left thumb area. Reports that pain in thumb the the worst when she is trying to hold something. Sleeps in brace that holds thumb to help pain, works if it is tight enough. Holding a book, phone, or Ipad causes pain everywhere. Back pain is now and again, last time it hurt was when she was at the doctor. Back pain can last up to two weeks, she has to rest a lot. Back pain is typically  central in low back. L knee pain is always there, R knee doesn't hurt as bad. Knee pain is chronic. Standing and walking causes knee pain to be worse.   Hand dominance: Right  PERTINENT HISTORY: Diabetes, plantar fasciitis   PAIN:  Are you having pain? Yes: NPRS scale: 5/10 Pain location: L thumb Pain description: sharp  Aggravating factors: Holding things  Relieving factors: nothing yet  PRECAUTIONS: None  RED FLAGS: None   WEIGHT BEARING RESTRICTIONS: No  FALLS:  Has patient fallen in last 6 months? No  OCCUPATION: Works in office  PLOF: Independent  PATIENT GOALS: Decrease pain    OBJECTIVE:  Note: Objective measures were completed at Evaluation unless otherwise noted.  DIAGNOSTIC FINDINGS:  Severe CMC narrowing and subluxation was noted.  PIP and DIP narrowing was  noted.  No MCP, intercarpal radiocarpal joint space narrowing was noted.   No erosive changes were noted.   Impression: These findings suggestive of osteoarthritis of the hand.   PATIENT SURVEYS :  Quick Dash:  QUICK DASH  Please rate your ability do the following activities in the last week by selecting the number below the appropriate response.   Activities Rating  Open a tight or new jar.  4 = Severe difficulty  Do heavy household chores (e.g., wash walls, floors). 4 = Severe difficulty  Carry a shopping bag or briefcase 2 = Mild difficulty  Wash your back. 2 = Mild difficulty  Use a knife to cut food. 2 = Mild difficulty  Recreational activities in which you take some force or impact through your arm, shoulder or hand (e.g., golf, hammering, tennis, etc.). 5 = Unable  During the past week, to what extent has your arm, shoulder or hand problem interfered with your normal social activities with family, friends, neighbors or groups?  2 = Slightly  During the past week, were you limited in your work or other regular daily activities as a result of your arm, shoulder or hand problem? 4 = Very  limited  Rate the severity of the following symptoms in the last week: Arm, Shoulder, or hand pain. 5 = Extreme  Rate the severity of the following symptoms in the last week: Tingling (pins and needles) in your arm, shoulder or hand. 4 = Severe  During the past week, how much difficulty have you had sleeping because of the pain in your arm, shoulder or hand?  2 = Mild difficulty   (A QuickDASH score may not be calculated if there is greater than 1 missing item.)  Total: 35/55  Minimally Clinically Important Difference (MCID): 15-20 points  (Franchignoni, F. et al. (2013). Minimally clinically important difference of the disabilities of the arm, shoulder, and hand outcome measures (DASH) and its shortened version (Quick DASH).  Journal of Orthopaedic & Sports Physical Therapy, 44(1), 30-39)   COGNITION: Overall cognitive status: Within functional limits for tasks assessed     SENSATION: WFL  POSTURE: Forward head, rounded shoulders   UPPER EXTREMITY ROM:   Active ROM Right eval Left eval  Shoulder flexion    Shoulder extension    Shoulder abduction    Shoulder adduction    Shoulder internal rotation    Shoulder external rotation    Elbow flexion    Elbow extension    Wrist flexion    Wrist extension    Wrist ulnar deviation    Wrist radial deviation    Wrist pronation    Wrist supination    Thumb Opposition   Some pain   (Blank rows = not tested)  LUMBAR ROM:   Active  A/PROM  eval  Flexion WFL  Extension WFL  Right lateral flexion WFL  Left lateral flexion WFL  Right rotation WFL, pain at end range  Left rotation WFL, pain at end range    (Blank rows = not tested)  LOWER EXTREMITY ROM:     Passive  Right eval Left eval  Hip flexion  Pain and limited ROM compared to right  at end range   Hip extension    Hip abduction    Hip adduction    Hip internal rotation    Hip external rotation    Knee flexion    Knee extension    Ankle dorsiflexion    Ankle  plantarflexion    Ankle inversion    Ankle eversion     (Blank rows = not tested)  UPPER EXTREMITY MMT:  MMT Right eval Left eval  Shoulder flexion    Shoulder extension    Shoulder abduction    Shoulder adduction    Shoulder internal rotation    Shoulder external rotation    Middle trapezius    Lower trapezius    Elbow flexion    Elbow extension    Wrist flexion    Wrist extension    Wrist ulnar deviation    Wrist radial deviation    Wrist pronation    Wrist supination    Grip strength (lbs) 20  5  Pinch grip  14.0 10.0  (Blank rows = not tested)  LOWER EXTREMITY MMT:    MMT Right eval Left eval  Hip flexion 9.4 12.4  Hip extension    Hip abduction 15.3 15.2  Hip adduction    Hip internal rotation    Hip external rotation    Knee flexion 35.5 29.2  Knee extension    Ankle dorsiflexion    Ankle plantarflexion    Ankle inversion    Ankle eversion     (Blank rows = not tested)  PALPATION:  Tenderness to L CMC joint  TREATMENT DATE:  01/09/24 Manual: STM/ IASTM to Lt wrist flexors, thenar emminance to decrease fascial restriction and improve mobility Sensitive skin rock tape applied over CMC and navicular to increase proprioception and decompress tissue. Pt instructed in safe tape removal technique Therapeutic exercise:  -prayer stretch,  -Lt thumb extension stretch, - Lt thumb flexion stretch (painful) - seated hamstring stretch 15s x 2 each LE - seated piriformis stretch -> not tolerated -> hooklying x 15s x 2 each LE - hooklying wind shield wipers for hip mobility   HEP updated and issued   PATIENT EDUCATION: Education details: Educated on HEP Person educated: Patient Education method: Explanation Education comprehension: verbalized understanding  HOME EXERCISE PROGRAM: Thumb opposition Thumb abduction strengthening with  rubber band  Key grip with puddy  CMC distraction   Access Code: [email or web address] URL: [email or web address] Date: 01/10/2024 Prepared by: [facility] [address]  Exercises - Thumb PROM  Composite Extension  - 1-2 x daily - 7 x weekly - 2-3 reps - 15-30 hold - Wrist Prayer Stretch  - 1-2 x daily - 7 x weekly - 2-3 reps - 15-30 hold - Supine Hip Internal and External Rotation  - 1-2 x daily - 7 x weekly - 2-3 reps - couple breaths hold - Seated Hamstring Stretch  - 1-2 x daily - 7 x weekly - 2-3 reps - 15-30 hold - Supine Piriformis Stretch with Foot on Ground  - 1-2 x daily - 7 x weekly - 2-3 reps - 15-30 hold  ASSESSMENT:  CLINICAL IMPRESSION: Pt sensitive to pressure/touch over Lt navicular and had difficulty tolerating Lt glute/ piriformis stretch in sitting position.  Therapist to check STG and add LE/core strengthening exercises next visit and assess response to previous treatment. Pt will be on vacation out of country for 2 wks at end of month.  Goals are ongoing.   Initial evaluation: Patient is a 68 y.o. female who was seen today for physical therapy evaluation and treatment for bilateral hand pain, low back pain, and bilateral knee pain. Patient experiences the most difficulty with her left CMC joint. She has decreased pinch and grip strength. Pain is limiting her ability to complete ADLs. She tolerated HEP well and explained understanding of all exercises. She will benefit from skilled therapy to address strength, range of motion, activity tolerance, and pain deficits.      OBJECTIVE IMPAIRMENTS: Abnormal gait, decreased endurance, decreased mobility, difficulty walking, decreased ROM, decreased strength, and hypomobility.   ACTIVITY LIMITATIONS: carrying, lifting, bending, sitting, standing, squatting, and stairs  PARTICIPATION LIMITATIONS: cleaning, laundry, driving, shopping, community activity, occupation, and yard work  PERSONAL FACTORS: 1  comorbidity: Diabetes are also affecting patient's functional outcome.   REHAB POTENTIAL: Fair Chronic, multiple pain sites  CLINICAL DECISION MAKING: Evolving/moderate complexity decreasing ability to use her thumb  EVALUATION COMPLEXITY: Moderate  GOALS: Goals reviewed with patient? Yes  SHORT TERM GOALS: Target date: 01/17/24  Patient will be independent with HEP in order to improve functional outcomes. Baseline: Goal status: INITIAL  2.  Patient will improve pinch strength by 5 lbs Baseline:  Goal status: INITIAL  3.  Patient will increase grip strength by 5 lbs Baseline: Goal status: INITIAL           4. Patient will increase gross bilateral LE strength by 5 lbs   LONG TERM GOALS: Target date: 02/28/24  Patient will report at least 75% improvement in symptoms for improved quality of life. Baseline:  Goal status: INITIAL  2.  Patient will improve Quick-Dash score by at least 15 points in order to indicate improved tolerance to activity. Baseline:  Goal status: INITIAL  3.  Patient will improve left hand grip strength to 20 lbs in order to indicate improved strength and activity tolerance.  Baseline:  Goal status: INITIAL  4.  Patient will improve key grip by 5 lbs to indicate improved intrinsic hand strength.  Baseline:  Goal status: INITIAL  5.  Patient will tolerate 45 minutes of walking before the onset of pain to indicate improved activity tolerance.  Baseline: ~15 minutes  Goal status: INITIAL  PLAN: PT FREQUENCY: 1-2x/week  PT DURATION: 8 weeks  PLANNED INTERVENTIONS: 97110-Therapeutic exercises, 97530- Therapeutic activity, [id number]- Neuromuscular re-education, 97535- Self Care, [id number]- Manual therapy, and Patient/Family education  PLAN FOR NEXT SESSION:  Begin with manual therapy to [employer] joint. Consider distraction and trigger point release. Progress strength as tolerated with the thumb. Next visit give base bilateral LE strengthening program with  emphasis on posture for the back. Consider reviewed soft tissue mobilization for the back for when it does hurt.   Delon Aquas, PTA 01/10/24 1:13 PM [facility] [address] [place], [place], [address] Phone: [phone]   Fax:  [phone]

## 2024-01-15 ENCOUNTER — Encounter (HOSPITAL_BASED_OUTPATIENT_CLINIC_OR_DEPARTMENT_OTHER): Payer: Self-pay | Admitting: Physical Therapy

## 2024-01-16 ENCOUNTER — Telehealth: Payer: Self-pay

## 2024-01-16 NOTE — Telephone Encounter (Signed)
 Copied from CRM #[id number]. Topic: Clinical - Order For Equipment >> Jan 12, 2024  9:37 AM Joesph PARAS wrote: Reason for CRM: Patient would like an order to be place for a cpap mask that does not come across the cheekbones, as she is having a surgery soon underneath her eye.  Patient would like a call, as she has questions about the cpap related to her surgery as well.  Izetta do you have any recommendations?

## 2024-01-17 ENCOUNTER — Encounter (HOSPITAL_BASED_OUTPATIENT_CLINIC_OR_DEPARTMENT_OTHER): Payer: Self-pay | Admitting: Physical Therapy

## 2024-01-17 ENCOUNTER — Ambulatory Visit (HOSPITAL_BASED_OUTPATIENT_CLINIC_OR_DEPARTMENT_OTHER): Admitting: Physical Therapy

## 2024-01-17 DIAGNOSIS — M79642 Pain in left hand: Secondary | ICD-10-CM

## 2024-01-17 DIAGNOSIS — M25642 Stiffness of left hand, not elsewhere classified: Secondary | ICD-10-CM

## 2024-01-17 DIAGNOSIS — M5459 Other low back pain: Secondary | ICD-10-CM

## 2024-01-17 DIAGNOSIS — G8929 Other chronic pain: Secondary | ICD-10-CM

## 2024-01-17 NOTE — Therapy (Signed)
 OUTPATIENT PHYSICAL THERAPY UPPER EXTREMITY TREATMENT   Patient Name: Carol [name] MRN: [id number] DOB:04/19/1955, 68 y.o., female Today's Date: 01/17/2024  END OF SESSION:  PT End of Session - 01/17/24 1017     Visit Number 3    Number of Visits 16    Date for Recertification  02/29/24    PT Start Time 1015    PT Stop Time 1053    PT Time Calculation (min) 38 min    Activity Tolerance Patient tolerated treatment well    Behavior During Therapy WFL for tasks assessed/performed           Past Medical History:  Diagnosis Date   Angio-edema    Blood type O+    Chest discomfort    , Atypical   Chicken pox    COVID-19 05/09/2019   Diabetic acidosis, type II (HCC)    Diverticulitis - large vs small bowel vs both?    Gastroesophageal reflux disease    Glaucoma    Hyperlipidemia    Hypertension    LLQ abdominal pain 09/26/2023   Plantar fasciitis, bilateral 2015   PVC (premature ventricular contraction)    Sleep apnea 07/2023   Systolic dysfunction    , Hyperdynamic   Past Surgical History:  Procedure Laterality Date   APPENDECTOMY N/A 11/05/2013   Procedure: APPENDECTOMY;  Surgeon: Lynwood MALVA Pina, MD;  Location: [facility] OR;  Service: General;  Laterality: N/A;   BOWEL RESECTION N/A 11/05/2013   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  Normal performed by Dr. Annis   COLONOSCOPY  04/03/2019   COLONOSCOPY  11/17/2023   LAPAROTOMY N/A 11/05/2013   Procedure: EXPLORATORY LAPAROTOMY;  Surgeon: Lynwood MALVA Pina, MD;  Location: [facility] OR;  Service: General;  Laterality: N/A;   Lexiscan  2010   Patient reports normal Lexiscan completed around 2010 by Dr. Annis.   TOTAL ABDOMINAL HYSTERECTOMY  2004   Patient Active Problem List   Diagnosis Date Noted   Altered bowel habits 09/26/2023   Severe obstructive sleep apnea 08/18/2023   Elevated coronary artery calcium  score 133; 72nd percentile 06/26/2023    ANA positive 05/15/2023   Abnormal SPEP 05/15/2023   B12 deficiency 01/13/2023   Irritable bowel syndrome with diarrhea 09/02/2022   Full incontinence of feces 09/02/2022   Recurrent UTI 01/10/2022   Obesity (BMI 30-39.9) 03/25/2021   Hypercalcemia 12/08/2020   Iron  deficiency anemia 12/08/2020   Hot flashes due to menopause 11/06/2019   Morbid obesity (HCC) 02/11/2019   Type 2 diabetes mellitus with hyperlipidemia (HCC) 02/11/2019   Diverticulitis large intestine 10/31/2013   Systolic dysfunction    Gastroesophageal reflux disease    Hypertension     PCP: Catherine Charlies LABOR, DO  REFERRING PROVIDER: Dolphus Reiter, MD  REFERRING DIAG:  [phone] (ICD-10-CM) - Pain in both hands  M25.562,G89.29 (ICD-10-CM) - Chronic pain of left knee  M53.3,G89.29 (ICD-10-CM) - Chronic SI joint pain    THERAPY DIAG:  Pain in left hand  Stiffness of left hand, not elsewhere classified  Other low back pain  Chronic pain of left knee  Chronic pain of right knee  Rationale for Evaluation and Treatment: Rehabilitation  ONSET DATE: Chronic   SUBJECTIVE:  SUBJECTIVE STATEMENT: Left hand is feeling a little better with the exercises.   From initial evaluation:  Patient reports that her most pain is in her left thumb area. Reports that pain in thumb the the worst when she is trying to hold something. Sleeps in brace that holds thumb to help pain, works if it is tight enough. Holding a book, phone, or Ipad causes pain everywhere. Back pain is now and again, last time it hurt was when she was at the doctor. Back pain can last up to two weeks, she has to rest a lot. Back pain is typically central in low back. L knee pain is always there, R knee doesn't hurt as bad. Knee pain is chronic. Standing and walking  causes knee pain to be worse.   Hand dominance: Right  PERTINENT HISTORY: Diabetes, plantar fasciitis   PAIN:  Are you having pain? Yes: NPRS scale: 0 now, up to 10/10 Pain location: lumbar Pain description: sharp  Aggravating factors: Holding things  Relieving factors: lay down, meds  PRECAUTIONS: None  RED FLAGS: None   WEIGHT BEARING RESTRICTIONS: No  FALLS:  Has patient fallen in last 6 months? No  OCCUPATION: Works in office  PLOF: Independent  PATIENT GOALS: Decrease pain    OBJECTIVE:  Note: Objective measures were completed at Evaluation unless otherwise noted.  DIAGNOSTIC FINDINGS:  Severe CMC narrowing and subluxation was noted.  PIP and DIP narrowing was  noted.  No MCP, intercarpal radiocarpal joint space narrowing was noted.   No erosive changes were noted.   Impression: These findings suggestive of osteoarthritis of the hand.   PATIENT SURVEYS :  Quick Dash:  QUICK DASH  Please rate your ability do the following activities in the last week by selecting the number below the appropriate response.   Activities Rating  Open a tight or new jar.  4 = Severe difficulty  Do heavy household chores (e.g., wash walls, floors). 4 = Severe difficulty  Carry a shopping bag or briefcase 2 = Mild difficulty  Wash your back. 2 = Mild difficulty  Use a knife to cut food. 2 = Mild difficulty  Recreational activities in which you take some force or impact through your arm, shoulder or hand (e.g., golf, hammering, tennis, etc.). 5 = Unable  During the past week, to what extent has your arm, shoulder or hand problem interfered with your normal social activities with family, friends, neighbors or groups?  2 = Slightly  During the past week, were you limited in your work or other regular daily activities as a result of your arm, shoulder or hand problem? 4 = Very limited  Rate the severity of the following symptoms in the last week: Arm, Shoulder, or hand pain. 5 =  Extreme  Rate the severity of the following symptoms in the last week: Tingling (pins and needles) in your arm, shoulder or hand. 4 = Severe  During the past week, how much difficulty have you had sleeping because of the pain in your arm, shoulder or hand?  2 = Mild difficulty   (A QuickDASH score may not be calculated if there is greater than 1 missing item.)  Total: 35/55  Minimally Clinically Important Difference (MCID): 15-20 points  (Franchignoni, F. et al. (2013). Minimally clinically important difference of the disabilities of the arm, shoulder, and hand outcome measures (DASH) and its shortened version (Quick DASH). Journal of Orthopaedic & Sports Physical Therapy, 44(1), 30-39)   COGNITION: Overall cognitive status: Within functional  limits for tasks assessed     SENSATION: WFL  POSTURE: Forward head, rounded shoulders   UPPER EXTREMITY ROM:   Active ROM Right eval Left eval  Shoulder flexion    Shoulder extension    Shoulder abduction    Shoulder adduction    Shoulder internal rotation    Shoulder external rotation    Elbow flexion    Elbow extension    Wrist flexion    Wrist extension    Wrist ulnar deviation    Wrist radial deviation    Wrist pronation    Wrist supination    Thumb Opposition   Some pain   (Blank rows = not tested)  LUMBAR ROM:   Active  A/PROM  eval  Flexion WFL  Extension WFL  Right lateral flexion WFL  Left lateral flexion WFL  Right rotation WFL, pain at end range  Left rotation WFL, pain at end range    (Blank rows = not tested)  LOWER EXTREMITY ROM:     Passive  Right eval Left eval  Hip flexion  Pain and limited ROM compared to right  at end range   Hip extension    Hip abduction    Hip adduction    Hip internal rotation    Hip external rotation    Knee flexion    Knee extension    Ankle dorsiflexion    Ankle plantarflexion    Ankle inversion    Ankle eversion     (Blank rows = not tested)  UPPER EXTREMITY  MMT:  MMT Right eval Left eval  Shoulder flexion    Shoulder extension    Shoulder abduction    Shoulder adduction    Shoulder internal rotation    Shoulder external rotation    Middle trapezius    Lower trapezius    Elbow flexion    Elbow extension    Wrist flexion    Wrist extension    Wrist ulnar deviation    Wrist radial deviation    Wrist pronation    Wrist supination    Grip strength (lbs) 20  5  Pinch grip  14.0 10.0  (Blank rows = not tested)  LOWER EXTREMITY MMT:    MMT Right eval Left eval  Hip flexion 9.4 12.4  Hip extension    Hip abduction 15.3 15.2  Hip adduction    Hip internal rotation    Hip external rotation    Knee flexion 35.5 29.2  Knee extension    Ankle dorsiflexion    Ankle plantarflexion    Ankle inversion    Ankle eversion     (Blank rows = not tested)  PALPATION:  Tenderness to L CMC joint                                                                                                                              TREATMENT DATE:  01/17/24 LTR Iso add with ab set Iso  add with small range bridge in DF for hamstring activation Supine 90/90 lift with ball bw knees Seated diaphragm breathing Edu on self use of tennis ball MANUAL: STM & rolling to LT hip and lateral leg  01/09/24 Manual: STM/ IASTM to Lt wrist flexors, thenar emminance to decrease fascial restriction and improve mobility Sensitive skin rock tape applied over CMC and navicular to increase proprioception and decompress tissue. Pt instructed in safe tape removal technique Therapeutic exercise:  -prayer stretch,  -Lt thumb extension stretch, - Lt thumb flexion stretch (painful) - seated hamstring stretch 15s x 2 each LE - seated piriformis stretch -> not tolerated -> hooklying x 15s x 2 each LE - hooklying wind shield wipers for hip mobility   HEP updated and issued   PATIENT EDUCATION: Education details: Educated on HEP Person educated: Patient Education method:  Explanation Education comprehension: verbalized understanding  HOME EXERCISE PROGRAM: Thumb opposition Thumb abduction strengthening with rubber band  Key grip with puddy  CMC distraction   THUMB: Access Code: [email or web address] URL: [email or web address] Date: 01/10/2024 Prepared by: [facility] [address]  HIP/LOW BACK: Access Code: [email or web address] URL: [email or web address] Date: 01/17/2024 Prepared by: Harlene Cordon   ASSESSMENT:  CLINICAL IMPRESSION: Began exercise program for central stability and core engagement to decrease spasm around hip. Excellent tolerance to treatment.   Initial evaluation: Patient is a 68 y.o. female who was seen today for physical therapy evaluation and treatment for bilateral hand pain, low back pain, and bilateral knee pain. Patient experiences the most difficulty with her left CMC joint. She has decreased pinch and grip strength. Pain is limiting her ability to complete ADLs. She tolerated HEP well and explained understanding of all exercises. She will benefit from skilled therapy to address strength, range of motion, activity tolerance, and pain deficits.      OBJECTIVE IMPAIRMENTS: Abnormal gait, decreased endurance, decreased mobility, difficulty walking, decreased ROM, decreased strength, and hypomobility.   ACTIVITY LIMITATIONS: carrying, lifting, bending, sitting, standing, squatting, and stairs  PARTICIPATION LIMITATIONS: cleaning, laundry, driving, shopping, community activity, occupation, and yard work  PERSONAL FACTORS: 1 comorbidity: Diabetes are also affecting patient's functional outcome.   REHAB POTENTIAL: Fair Chronic, multiple pain sites  CLINICAL DECISION MAKING: Evolving/moderate complexity decreasing ability to use her thumb  EVALUATION COMPLEXITY: Moderate  GOALS: Goals reviewed with patient? Yes  SHORT TERM GOALS: Target date: 01/17/24  Patient will be independent with HEP in  order to improve functional outcomes. Baseline: Goal status: INITIAL  2.  Patient will improve pinch strength by 5 lbs Baseline:  Goal status: INITIAL  3.  Patient will increase grip strength by 5 lbs Baseline: Goal status: INITIAL           4. Patient will increase gross bilateral LE strength by 5 lbs   LONG TERM GOALS: Target date: 02/28/24  Patient will report at least 75% improvement in symptoms for improved quality of life. Baseline:  Goal status: INITIAL  2.  Patient will improve Quick-Dash score by at least 15 points in order to indicate improved tolerance to activity. Baseline:  Goal status: INITIAL  3.  Patient will improve left hand grip strength to 20 lbs in order to indicate improved strength and activity tolerance.  Baseline:  Goal status: INITIAL  4.  Patient will improve key grip by 5 lbs to indicate improved intrinsic hand strength.  Baseline:  Goal status: INITIAL  5.  Patient will tolerate 45 minutes of walking before the onset of pain to indicate  improved activity tolerance.  Baseline: ~15 minutes  Goal status: INITIAL  PLAN: PT FREQUENCY: 1-2x/week  PT DURATION: 8 weeks  PLANNED INTERVENTIONS: 97110-Therapeutic exercises, 97530- Therapeutic activity, [id number]- Neuromuscular re-education, 97535- Self Care, [id number]- Manual therapy, and Patient/Family education  PLAN FOR NEXT SESSION:  Begin with manual therapy to [facility] joint. Consider distraction and trigger point release. Progress strength as tolerated with the thumb. Next visit give base bilateral LE strengthening program with emphasis on posture for the back. Consider reviewed soft tissue mobilization for the back for when it does hurt.   Rajanee Schuelke C. Lajune Perine PT, DPT 01/17/24 11:06 AM  [facility] [address] [place], [place], [address] Phone: [phone]   Fax:  [phone]

## 2024-01-18 NOTE — Telephone Encounter (Signed)
 There's not really a mask that doesn't come across the cheekbones. All of the mask options typically have a strap that go across the cheeks, except for some of the full face but that's going to bring the CPAP mask closer to her eyes. There should not be a reason she can't wear her current CPAP set up after surgery. If her surgeon wants her to hold therapy for a few days afterwards, that is fine. Thanks.

## 2024-01-19 ENCOUNTER — Other Ambulatory Visit (HOSPITAL_COMMUNITY)
Admission: RE | Admit: 2024-01-19 | Discharge: 2024-01-19 | Disposition: A | Discharge status: Home / Self Care | Source: Ambulatory Visit | Attending: Family | Admitting: Family

## 2024-01-19 ENCOUNTER — Ambulatory Visit (INDEPENDENT_AMBULATORY_CARE_PROVIDER_SITE_OTHER): Admitting: Family

## 2024-01-19 ENCOUNTER — Ambulatory Visit: Payer: Self-pay | Admitting: *Deleted

## 2024-01-19 ENCOUNTER — Encounter: Payer: Self-pay | Admitting: Family

## 2024-01-19 VITALS — BP 128/74 | HR 51 | Temp 98.8°F | Wt 192.4 lb

## 2024-01-19 DIAGNOSIS — N898 Other specified noninflammatory disorders of vagina: Secondary | ICD-10-CM | POA: Diagnosis not present

## 2024-01-19 DIAGNOSIS — B3731 Acute candidiasis of vulva and vagina: Secondary | ICD-10-CM | POA: Insufficient documentation

## 2024-01-19 DIAGNOSIS — R32 Unspecified urinary incontinence: Secondary | ICD-10-CM

## 2024-01-19 LAB — POC URINALSYSI DIPSTICK (AUTOMATED)
Bilirubin, UA: NEGATIVE
Blood, UA: NEGATIVE
Glucose, UA: POSITIVE — AB
Ketones, UA: NEGATIVE
Leukocytes, UA: NEGATIVE
Nitrite, UA: NEGATIVE
Protein, UA: NEGATIVE
Spec Grav, UA: 1.01 (ref 1.010–1.025)
Urobilinogen, UA: 0.2 U/dL
pH, UA: 6 (ref 5.0–8.0)

## 2024-01-19 LAB — GLUCOSE, POCT (MANUAL RESULT ENTRY): POC Glucose: 114 mg/dL — AB (ref 70–99)

## 2024-01-19 MED ORDER — FLUCONAZOLE 150 MG PO TABS: ORAL_TABLET | ORAL | 0 refills | Status: AC

## 2024-01-19 NOTE — Progress Notes (Signed)
 Acute Office Visit  Subjective:     Patient ID: KAYDON CREEDON, female    DOB: 03/27/1956, 68 y.o.   MRN: [id number]  Chief Complaint  Patient presents with   Back Pain   Groin Pain    Vaginal itching and swelling; started yesterday   Urinary Incontinence    HPI Patient is in today with complaints of vaginal itching and swelling that began yesterday.  She has a history of type 2 diabetes and is on Jardiance  tolerates it well.  However, she has yeast infections from time to time also reports groin and low back pain.  Has urinary frequency and leakage.  No fever or chills.  Review of Systems  Respiratory: Negative.    Cardiovascular: Negative.   Gastrointestinal:        Pelvic pain  Genitourinary:  Positive for frequency. Negative for hematuria and urgency.  Musculoskeletal: Negative.   Neurological: Negative.   Psychiatric/Behavioral: Negative.    All other systems reviewed and are negative.  Past Medical History:  Diagnosis Date   Angio-edema    Blood type O+    Chest discomfort    , Atypical   Chicken pox    COVID-19 05/09/2019   Diabetic acidosis, type II (HCC)    Diverticulitis - large vs small bowel vs both?    Gastroesophageal reflux disease    Glaucoma    Hyperlipidemia    Hypertension    LLQ abdominal pain 09/26/2023   Plantar fasciitis, bilateral 2015   PVC (premature ventricular contraction)    Sleep apnea 07/2023   Systolic dysfunction    , Hyperdynamic    Social History   Socioeconomic History   Marital status: Married    Spouse name: Not on file   Number of children: Not on file   Years of education: Not on file   Highest education level: Not on file  Occupational History   Not on file  Tobacco Use   Smoking status: Never    Passive exposure: Past   Smokeless tobacco: Never  Vaping Use   Vaping status: Never Used  Substance and Sexual Activity   Alcohol use: Not Currently    Comment: Occassional drinker   Drug use: No   Sexual  activity: Not Currently  Other Topics Concern   Not on file  Social History Narrative   Marital status/children/pets: Married.   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   Safety:      -smoke alarm in the home:Yes     - wears seatbelt: Yes     - Feels safe in their relationships: Yes   Social Drivers of Corporate investment banker Strain: Low Risk  (07/26/2023)   Overall Financial Resource Strain (CARDIA)    Difficulty of Paying Living Expenses: Not hard at all  Food Insecurity: No Food Insecurity (07/26/2023)   Hunger Vital Sign    Worried About Running Out of Food in the Last Year: Never true    Ran Out of Food in the Last Year: Never true  Transportation Needs: No Transportation Needs (07/26/2023)   PRAPARE - Administrator, Civil Service (Medical): No    Lack of Transportation (Non-Medical): No  Physical Activity: Inactive (07/26/2023)   Exercise Vital Sign    Days of Exercise per Week: 0 days    Minutes of Exercise per Session: 0 min  Stress: No Stress Concern Present (07/26/2023)   [employer] of Occupational Health - Occupational Stress Questionnaire  Feeling of Stress : Not at all  Social Connections: Moderately Integrated (07/26/2023)   Social Connection and Isolation Panel    Frequency of Communication with Friends and Family: More than three times a week    Frequency of Social Gatherings with Friends and Family: Three times a week    Attends Religious Services: More than 4 times per year    Active Member of Clubs or Organizations: No    Attends Banker Meetings: Never    Marital Status: Married  Catering manager Violence: Not At Risk (07/26/2023)   Humiliation, Afraid, Rape, and Kick questionnaire    Fear of Current or Ex-Partner: No    Emotionally Abused: No    Physically Abused: No    Sexually Abused: No    Past Surgical History:  Procedure Laterality Date   APPENDECTOMY N/A 11/05/2013    Procedure: APPENDECTOMY;  Surgeon: Lynwood MALVA Pina, MD;  Location: [facility] OR;  Service: General;  Laterality: N/A;   BOWEL RESECTION N/A 11/05/2013   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  Normal performed by Dr. Annis   COLONOSCOPY  04/03/2019   COLONOSCOPY  11/17/2023   LAPAROTOMY N/A 11/05/2013   Procedure: EXPLORATORY LAPAROTOMY;  Surgeon: Lynwood MALVA Pina, MD;  Location[facility] OR;  Service: General;  Laterality: N/A;   Lexiscan  2010   Patient reports normal Lexiscan completed around 2010 by Dr. Annis.   TOTAL ABDOMINAL HYSTERECTOMY  2004    Family History  Problem Relation Age of Onset   Heart disease Mother    Heart attack Mother    Asthma Mother    Early death Mother    Miscarriages / Stillbirths Mother    Emphysema Mother    Heart disease Father    Lung cancer Father    Diverticulitis Sister    Heart disease Sister    Heart attack Brother    Asthma Brother    Heart disease Brother    Kidney disease Brother    Heart disease Brother    Heart attack Brother    Heart disease Brother    Heart attack Brother    Meniere's disease Brother    Early death Maternal Grandmother    Tuberculosis Maternal Grandmother    Depression Maternal Grandfather    Heart disease Paternal Grandmother    Colon cancer Neg Hx    Esophageal cancer Neg Hx    Rectal cancer Neg Hx    Stomach cancer Neg Hx     Allergies  Allergen Reactions   Ivp Dye [Iodinated Contrast Media] Hives and Shortness Of Breath    Did ok in OP setting with 13 hr Premedications    Latanoprost  Other (See Comments)   Lisinopril Cough   Onion Rash    Rash and GI upset   Sulfa Antibiotics Hives    Current Outpatient Medications on File Prior to Visit  Medication Sig Dispense Refill   Ascorbic Acid (VITAMIN C) 1000 MG tablet Take 1,000 mg by mouth daily.     aspirin  81 MG tablet Take 81 mg by mouth at bedtime.       candesartan -hydrochlorothiazide (ATACAND  HCT) 16-12.5 MG tablet Take 1 tablet by mouth daily. 90 tablet 1   cetirizine (ZYRTEC) 10 MG tablet Take 10 mg by mouth at bedtime.      citalopram  (CELEXA ) 20 MG tablet Take 1 tablet (20 mg total) by mouth daily. 90 tablet 1   empagliflozin  (  JARDIANCE ) 25 MG TABS tablet Take 1 tablet (25 mg total) by mouth daily. 90 tablet 1   FERREX 150 150 MG capsule Take 1 capsule (150 mg total) by mouth daily. 90 capsule 3   fluocinonide  ointment (LIDEX ) 0.05 % Apply 1 Application topically 2 (two) times daily. (Patient taking differently: Apply 1 Application topically as needed.) 30 g 2   fluticasone  (FLONASE ) 50 MCG/ACT nasal spray Place 2 sprays into both nostrils daily. (Patient taking differently: Place 2 sprays into both nostrils as needed.) 16 g 6   hyoscyamine  (LEVBID ) 0.375 MG 12 hr tablet Take 1 tablet (0.375 mg total) by mouth 2 (two) times daily. (Patient taking differently: Take 0.375 mg by mouth as needed.) 60 tablet 5   metFORMIN  (GLUCOPHAGE -XR) 750 MG 24 hr tablet Take 1 tablet (750 mg total) by mouth daily with breakfast. 90 tablet 1   metoprolol  succinate (TOPROL -XL) 25 MG 24 hr tablet Take 1 tablet (25 mg total) by mouth daily. Take with or immediately following a meal. Needs OV for refills 90 tablet 1   omeprazole  (PRILOSEC) 20 MG capsule Take 1 capsule (20 mg total) by mouth daily. 90 capsule 1   ondansetron  (ZOFRAN ) 4 MG tablet Take 1 tablet 20 minutes before drinking colonoscopy prep. 2 tablet 0   ONETOUCH ULTRA TEST test strip 2 (two) times daily.     rosuvastatin  (CRESTOR ) 40 MG tablet Take 1 tablet (40 mg total) by mouth at bedtime. 90 tablet 3   timolol  (TIMOPTIC ) 0.25 % ophthalmic solution Place 1 drop into the left eye 2 (two) times daily.     Travoprost, BAK Free, (TRAVATAN) 0.004 % SOLN ophthalmic solution Place 1 drop into both eyes at bedtime.     UNABLE TO FIND at bedtime. CPAP machine     No current facility-administered medications on  file prior to visit.    BP 128/74   Pulse (!) 51   Temp 98.8 F (37.1 C)   Wt 192 lb 6.4 oz (87.3 kg)   SpO2 95%   BMI 34.08 kg/m chart      Objective:    BP 128/74   Pulse (!) 51   Temp 98.8 F (37.1 C)   Wt 192 lb 6.4 oz (87.3 kg)   SpO2 95%   BMI 34.08 kg/m    Physical Exam Constitutional:      Appearance: She is obese.  Cardiovascular:     Rate and Rhythm: Normal rate and regular rhythm.  Pulmonary:     Effort: Pulmonary effort is normal.     Breath sounds: Normal breath sounds.  Abdominal:     General: Abdomen is flat. Bowel sounds are normal.     Palpations: Abdomen is soft.  Genitourinary:    Comments: External genitalia thematous and mildly excoriated.  Mild, but thick white discharge noted Neurological:     General: No focal deficit present.     Mental Status: She is alert and oriented to person, place, and time.  Psychiatric:        Mood and Affect: Mood normal.        Behavior: Behavior normal.     Results for orders placed or performed in visit on 01/19/24  POCT Urinalysis Dipstick (Automated)  Result Value Ref Range   Color, UA yellow    Clarity, UA clear    Glucose, UA Positive (A) Negative   Bilirubin, UA neg    Ketones, UA neg    Spec Grav, UA 1.010 1.010 - 1.025  Blood, UA neg    pH, UA 6.0 5.0 - 8.0   Protein, UA Negative Negative   Urobilinogen, UA 0.2 0.2 or 1.0 E.U./dL   Nitrite, UA neg    Leukocytes, UA Negative Negative  POCT Glucose (CBG)  Result Value Ref Range   POC Glucose 114 (A) 70 - 99 mg/dl        Assessment & Plan:   Problem List Items Addressed This Visit   None Visit Diagnoses       Urinary incontinence, unspecified type    -  Primary   Relevant Orders   POCT Urinalysis Dipstick (Automated) (Completed)   Urine Culture     Vaginal itching       Relevant Orders   POCT Glucose (CBG) (Completed)   Cervicovaginal ancillary only     Candida vaginitis       Relevant Medications   fluconazole  (DIFLUCAN )  150 MG tablet   Other Relevant Orders   Cervicovaginal ancillary only       Meds ordered this encounter  Medications   fluconazole  (DIFLUCAN ) 150 MG tablet    Sig: 1 tab p.o. weekly for 4 weeks    Dispense:  4 tablet    Refill:  0   Urine culture sent today will notify patient pending results.  Treat with Diflucan  150 mg may repeat in 3 days if needed drink plenty of water.  Call the office if symptoms worsen or persist.  Recheck as scheduled and sooner as needed. No follow-ups on file.  Rhianna Raulerson B Jaedyn Marrufo, FNP

## 2024-01-19 NOTE — Telephone Encounter (Signed)
 noted

## 2024-01-19 NOTE — Telephone Encounter (Signed)
 FYI Only or Action Required?: FYI only for provider.  Patient was last seen in primary care on 12/15/2023 by Catherine Fuller A, DO.  Called Nurse Triage reporting urinary leakage.  Symptoms began yesterday.  Interventions attempted: Nothing.  Symptoms are: gradually worsening.  Triage Disposition: See Physician Within 24 Hours  Patient/caregiver understands and will follow disposition?: yes   Reason for Disposition  Genital area looks infected (e.g., draining sore, spreading redness)  Answer Assessment - Initial Assessment Questions 1. SYMPTOM: What's the main symptom you're concerned about? (e.g., rash, itching, swelling, dryness)     Urinary leakage, swelling vulva, back pain R 2. LOCATION: Where is the pain located? (e.g., inside/outside, left/right)     outside 3. ONSET: When did the  symptoms  start?     yesterday  5. CAUSE: What do you think is causing the symptoms?     Yeast vs UTI 6. OTHER SYMPTOMS: Do you have any other symptoms? (e.g., fever, vaginal bleeding, pain with urination)     no  Protocols used: Vulvar Symptoms-A-AH   Copied from CRM #[id number]. Topic: Clinical - Red Word Triage >> Jan 19, 2024  8:40 AM Adelita E wrote: [facility] that prompted transfer to Nurse Triage: Potential yeast infection. Vaginal pain and swelling going on for one day.

## 2024-01-21 ENCOUNTER — Ambulatory Visit: Payer: Self-pay | Admitting: Family

## 2024-01-21 LAB — URINE CULTURE
MICRO NUMBER:: 17086108
SPECIMEN QUALITY:: ADEQUATE

## 2024-01-22 NOTE — Progress Notes (Signed)
Pt advised via my chart.

## 2024-01-23 LAB — CERVICOVAGINAL ANCILLARY ONLY
Candida Glabrata: NEGATIVE
Candida Vaginitis: NEGATIVE
Comment: NEGATIVE
Comment: NEGATIVE
Comment: NEGATIVE
Trichomonas: NEGATIVE

## 2024-02-09 ENCOUNTER — Telehealth: Payer: Self-pay

## 2024-02-09 ENCOUNTER — Ambulatory Visit
Admission: RE | Admit: 2024-02-09 | Discharge: 2024-02-09 | Disposition: A | Discharge status: Home / Self Care | Source: Ambulatory Visit | Attending: Family Medicine | Admitting: Family Medicine

## 2024-02-09 VITALS — BP 150/73 | HR 75 | Temp 99.8°F | Resp 20

## 2024-02-09 DIAGNOSIS — H6693 Otitis media, unspecified, bilateral: Secondary | ICD-10-CM | POA: Diagnosis not present

## 2024-02-09 MED ORDER — AMOXICILLIN-POT CLAVULANATE 875-125 MG PO TABS
1.0000 | ORAL_TABLET | Freq: Two times a day (BID) | ORAL | 0 refills | Status: AC
Start: 2024-02-09 — End: 2024-02-19

## 2024-02-09 MED ORDER — AMOXICILLIN-POT CLAVULANATE 875-125 MG PO TABS: 1.0000 | ORAL_TABLET | Freq: Two times a day (BID) | ORAL | 0 refills | Status: DC

## 2024-02-09 NOTE — Discharge Instructions (Addendum)
 Advised patient take medication as directed with food to completion.  Encouraged to increase daily water intake to 64 ounces per day while taking these medications.  Advised if symptoms worsen and/or unresolved please follow-up with your PCP or here for further evaluation.

## 2024-02-09 NOTE — Telephone Encounter (Signed)
 Pharmacy changed per patient request.

## 2024-02-09 NOTE — ED Provider Notes (Addendum)
 Carol [name]    CSN: [id number] Arrival date & time: 02/09/24  1533      History   Chief Complaint Chief Complaint  Patient presents with   Cough    Entered by patient    HPI Carol [name] is a 68 y.o. female.   HPI Very pleasant 68 year old female presents with headache and bilateral ear pain that started yesterday, Thursday, 02/08/2024.  Patient reports having skin surgery for cancer removed next week and told to stop all of her OTC meds on Tuesday.  PMH significant for obesity, HTN, and HLD.  Past Medical History:  Diagnosis Date   Angio-edema    Blood type O+    Chest discomfort    , Atypical   Chicken pox    COVID-19 05/09/2019   Diabetic acidosis, type II (HCC)    Diverticulitis - large vs small bowel vs both?    Gastroesophageal reflux disease    Glaucoma    Hyperlipidemia    Hypertension    LLQ abdominal pain 09/26/2023   Plantar fasciitis, bilateral 2015   PVC (premature ventricular contraction)    Sleep apnea 07/2023   Systolic dysfunction    , Hyperdynamic    Patient Active Problem List   Diagnosis Date Noted   Altered bowel habits 09/26/2023   Severe obstructive sleep apnea 08/18/2023   Elevated coronary artery calcium  score 133; 72nd percentile 06/26/2023   ANA positive 05/15/2023   Abnormal SPEP 05/15/2023   B12 deficiency 01/13/2023   Irritable bowel syndrome with diarrhea 09/02/2022   Full incontinence of feces 09/02/2022   Recurrent UTI 01/10/2022   Obesity (BMI 30-39.9) 03/25/2021   Hypercalcemia 12/08/2020   Iron  deficiency anemia 12/08/2020   Hot flashes due to menopause 11/06/2019   Morbid obesity (HCC) 02/11/2019   Type 2 diabetes mellitus with hyperlipidemia (HCC) 02/11/2019   Diverticulitis large intestine 10/31/2013   Systolic dysfunction    Gastroesophageal reflux disease    Hypertension     Past Surgical History:  Procedure Laterality Date   APPENDECTOMY N/A 11/05/2013   Procedure: APPENDECTOMY;  Surgeon:  Lynwood MALVA Pina, MD;  Location: [facility] OR;  Service: General;  Laterality: N/A;   BOWEL RESECTION N/A 11/05/2013   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  Normal performed by Dr. Annis   COLONOSCOPY  04/03/2019   COLONOSCOPY  11/17/2023   LAPAROTOMY N/A 11/05/2013   Procedure: EXPLORATORY LAPAROTOMY;  Surgeon: Lynwood MALVA Pina, MD;  Location: [facility] OR;  Service: General;  Laterality: N/A;   Lexiscan  2010   Patient reports normal Lexiscan completed around 2010 by Dr. Annis.   TOTAL ABDOMINAL HYSTERECTOMY  2004    OB History     Gravida  0   Para  0   Term  0   Preterm  0   AB  0   Living  0      SAB  0   IAB  0   Ectopic  0   Multiple  0   Live Births  0            Home Medications    Prior to Admission medications   Medication Sig Start Date End Date Taking? Authorizing Provider  amoxicillin -clavulanate (AUGMENTIN ) 875-125 MG tablet Take 1 tablet by mouth 2 (two) times daily for 10 days. 02/09/24 02/19/24 Yes Teddy Sharper, FNP  Ascorbic Acid (VITAMIN C) 1000 MG tablet Take 1,000 mg by  mouth daily.    [provider]  aspirin  81 MG tablet Take 81 mg by mouth at bedtime.     [provider]  candesartan -hydrochlorothiazide (ATACAND  HCT) 16-12.5 MG tablet Take 1 tablet by mouth daily. 12/06/23   Kuneff, Renee A, DO  cetirizine (ZYRTEC) 10 MG tablet Take 10 mg by mouth at bedtime.     [provider]  citalopram  (CELEXA ) 20 MG tablet Take 1 tablet (20 mg total) by mouth daily. 12/06/23   Kuneff, Renee A, DO  empagliflozin  (JARDIANCE ) 25 MG TABS tablet Take 1 tablet (25 mg total) by mouth daily. 12/06/23   Kuneff, Renee A, DO  FERREX 150 150 MG capsule Take 1 capsule (150 mg total) by mouth daily. 12/07/23   Kuneff, Renee A, DO  fluconazole  (DIFLUCAN ) 150 MG tablet 1 tab p.o. weekly for 4 weeks 01/19/24   Webb, Padonda B, FNP  fluocinonide  ointment (LIDEX ) 0.05 %  Apply 1 Application topically 2 (two) times daily. Patient taking differently: Apply 1 Application topically as needed. 11/05/21   Kuneff, Renee A, DO  fluticasone  (FLONASE ) 50 MCG/ACT nasal spray Place 2 sprays into both nostrils daily. Patient taking differently: Place 2 sprays into both nostrils as needed. 05/11/23   Kuneff, Renee A, DO  hyoscyamine  (LEVBID ) 0.375 MG 12 hr tablet Take 1 tablet (0.375 mg total) by mouth 2 (two) times daily. Patient taking differently: Take 0.375 mg by mouth as needed. 11/17/23   Avram Lupita BRAVO, MD  metFORMIN  (GLUCOPHAGE -XR) 750 MG 24 hr tablet Take 1 tablet (750 mg total) by mouth daily with breakfast. 12/06/23   Kuneff, Renee A, DO  metoprolol  succinate (TOPROL -XL) 25 MG 24 hr tablet Take 1 tablet (25 mg total) by mouth daily. Take with or immediately following a meal. Needs OV for refills 12/06/23   Kuneff, Renee A, DO  omeprazole  (PRILOSEC) 20 MG capsule Take 1 capsule (20 mg total) by mouth daily. 12/06/23   Kuneff, Renee A, DO  ondansetron  (ZOFRAN ) 4 MG tablet Take 1 tablet 20 minutes before drinking colonoscopy prep. 09/26/23   Zehr, Jessica D, PA-C  ONETOUCH ULTRA TEST test strip 2 (two) times daily. 10/02/23   [provider]  rosuvastatin  (CRESTOR ) 40 MG tablet Take 1 tablet (40 mg total) by mouth at bedtime. 12/06/23   Kuneff, Renee A, DO  timolol  (TIMOPTIC ) 0.25 % ophthalmic solution Place 1 drop into the left eye 2 (two) times daily. 08/21/20   [provider]  Travoprost, BAK Free, (TRAVATAN) 0.004 % SOLN ophthalmic solution Place 1 drop into both eyes at bedtime.    [provider]  UNABLE TO FIND at bedtime. CPAP machine    [provider]    Family History Family History  Problem Relation Age of Onset   Heart disease Mother    Heart attack Mother    Asthma Mother    Early death Mother    Miscarriages / Stillbirths Mother    Emphysema Mother    Heart disease Father    Lung cancer Father    Diverticulitis Sister     Heart disease Sister    Heart attack Brother    Asthma Brother    Heart disease Brother    Kidney disease Brother    Heart disease Brother    Heart attack Brother    Heart disease Brother    Heart attack Brother    Meniere's disease Brother    Early death Maternal Grandmother    Tuberculosis Maternal Grandmother  Depression Maternal Grandfather    Heart disease Paternal Grandmother    Colon cancer Neg Hx    Esophageal cancer Neg Hx    Rectal cancer Neg Hx    Stomach cancer Neg Hx     Social History Social History   Tobacco Use   Smoking status: Never    Passive exposure: Past   Smokeless tobacco: Never  Vaping Use   Vaping status: Never Used  Substance Use Topics   Alcohol use: Not Currently    Comment: Occassional drinker   Drug use: No     Allergies   Ivp dye [iodinated contrast media], Latanoprost , Lisinopril, Onion, and Sulfa antibiotics   Review of Systems Review of Systems  HENT:  Positive for ear pain.   All other systems reviewed and are negative.    Physical Exam Triage Vital Signs ED Triage Vitals  Encounter Vitals Group     BP 02/09/24 1549 (!) 150/73     Girls Systolic BP Percentile --      Girls Diastolic BP Percentile --      Boys Systolic BP Percentile --      Boys Diastolic BP Percentile --      Pulse Rate 02/09/24 1549 75     Resp 02/09/24 1549 20     Temp 02/09/24 1549 99.8 F (37.7 C)     Temp Source 02/09/24 1549 Oral     SpO2 02/09/24 1549 95 %     Weight --      Height --      Head Circumference --      Peak Flow --      Pain Score 02/09/24 1547 0     Pain Loc --      Pain Education --      Exclude from Growth Chart --    No data found.  Updated Vital Signs BP (!) 150/73 (BP Location: Right Arm)   Pulse 75   Temp 99.8 F (37.7 C) (Oral)   Resp 20   SpO2 95%    Physical Exam Vitals and nursing note reviewed.  Constitutional:      Appearance: Normal appearance. She is obese.  HENT:     Head: Normocephalic  and atraumatic.     Right Ear: Ear canal and external ear normal.     Left Ear: Ear canal and external ear normal.     Ears:     Comments: Bilateral TM's: Erythematous, bulging    Mouth/Throat:     Mouth: Mucous membranes are moist.     Pharynx: Oropharynx is clear.  Eyes:     Extraocular Movements: Extraocular movements intact.     Pupils: Pupils are equal, round, and reactive to light.  Cardiovascular:     Rate and Rhythm: Normal rate and regular rhythm.     Pulses: Normal pulses.     Heart sounds: Normal heart sounds.  Pulmonary:     Effort: Pulmonary effort is normal.     Breath sounds: Normal breath sounds. No wheezing, rhonchi or rales.  Musculoskeletal:        General: Normal range of motion.     Cervical back: Normal range of motion and neck supple.  Skin:    General: Skin is warm and dry.  Neurological:     General: No focal deficit present.     Mental Status: She is alert and oriented to person, place, and time. Mental status is at baseline.  Psychiatric:  Mood and Affect: Mood normal.        Behavior: Behavior normal.      UC Treatments / Results  Labs (all labs ordered are listed, but only abnormal results are displayed) Labs Reviewed - No data to display  EKG   Radiology No results found.  Procedures Procedures (including critical [name] time)  Medications Ordered in UC Medications - No data to display  Initial Impression / Assessment and Plan / UC Course  I have reviewed the triage vital signs and the nursing notes.  Pertinent labs & imaging results that were available during my [name] of the patient were reviewed by me and considered in my medical decision making (see chart for details).     MDM: 1.  Acute bilateral otitis media-Rx'd Augmentin  875/125 mg tablet: Take 1 tablet twice daily x 10 days. Advised patient take medication as directed with food to completion.  Encouraged to increase daily water intake to 64 ounces per day while taking  these medications.  Advised if symptoms worsen and/or unresolved please follow-up with your PCP or here for further evaluation. Final Clinical Impressions(s) / UC Diagnoses   Final diagnoses:  Acute bilateral otitis media     Discharge Instructions      Advised patient take medication as directed with food to completion.  Encouraged to increase daily water intake to 64 ounces per day while taking these medications.  Advised if symptoms worsen and/or unresolved please follow-up with your PCP or here for further evaluation.     ED Prescriptions     Medication Sig Dispense Auth. Provider   amoxicillin -clavulanate (AUGMENTIN ) 875-125 MG tablet Take 1 tablet by mouth 2 (two) times daily for 10 days. 20 tablet Jathen Sudano, FNP      PDMP not reviewed this encounter.   Teddy Sharper, FNP 02/09/24 1637    Teddy Sharper, FNP 02/09/24 1730

## 2024-02-09 NOTE — ED Triage Notes (Signed)
 Pt c/o productive cough (clear sputum), HA, and bilat ear pain that started yesterday. Pt states she is having surgery for skin cancer removal next Friday. She was told to stop all her OTC meds, including Zyrtec, which she takes daily. Stopped meds on Tuesday. Reports improvement of HA and ear pain with ibuprofen.

## 2024-02-13 ENCOUNTER — Ambulatory Visit (HOSPITAL_BASED_OUTPATIENT_CLINIC_OR_DEPARTMENT_OTHER): Admitting: Physical Therapy

## 2024-02-14 LAB — HM DIABETES EYE EXAM

## 2024-02-15 ENCOUNTER — Encounter (HOSPITAL_BASED_OUTPATIENT_CLINIC_OR_DEPARTMENT_OTHER): Admitting: Physical Therapy

## 2024-03-13 ENCOUNTER — Other Ambulatory Visit: Payer: Self-pay | Admitting: Family Medicine

## 2024-03-21 ENCOUNTER — Telehealth: Payer: Self-pay

## 2024-03-21 NOTE — Telephone Encounter (Signed)
 Paper receive from [facility] regarding pt's cpap supplies. This is only requiring a provider signature. Izetta Rouleau, NP has signed and this is sent for fax. NFN

## 2024-04-12 ENCOUNTER — Other Ambulatory Visit: Payer: Self-pay | Admitting: Family Medicine

## 2024-05-07 ENCOUNTER — Ambulatory Visit: Admitting: Family Medicine

## 2024-05-22 ENCOUNTER — Encounter: Admitting: Family Medicine

## 2024-06-04 ENCOUNTER — Ambulatory Visit: Admitting: Rheumatology

## 2024-07-31 ENCOUNTER — Encounter
